# Patient Record
Sex: Male | Born: 1960 | Race: White | Hispanic: No | Marital: Single | Smoking: Former smoker
Health system: Southern US, Community
[De-identification: ages and names within clinical notes are randomized; demographics above are authoritative.]

## PROBLEM LIST (undated history)

## (undated) DIAGNOSIS — F17200 Nicotine dependence, unspecified, uncomplicated: Secondary | ICD-10-CM

## (undated) DIAGNOSIS — I10 Essential (primary) hypertension: Secondary | ICD-10-CM

## (undated) DIAGNOSIS — E119 Type 2 diabetes mellitus without complications: Secondary | ICD-10-CM

## (undated) DIAGNOSIS — C801 Malignant (primary) neoplasm, unspecified: Secondary | ICD-10-CM

## (undated) DIAGNOSIS — C229 Malignant neoplasm of liver, not specified as primary or secondary: Secondary | ICD-10-CM

## (undated) DIAGNOSIS — IMO0002 Reserved for concepts with insufficient information to code with codable children: Secondary | ICD-10-CM

## (undated) DIAGNOSIS — C3490 Malignant neoplasm of unspecified part of unspecified bronchus or lung: Secondary | ICD-10-CM

## (undated) DIAGNOSIS — C7951 Secondary malignant neoplasm of bone: Secondary | ICD-10-CM

## (undated) DIAGNOSIS — IMO0001 Reserved for inherently not codable concepts without codable children: Secondary | ICD-10-CM

## (undated) HISTORY — DX: Secondary malignant neoplasm of bone: C79.51

## (undated) HISTORY — PX: OTHER SURGICAL HISTORY: SHX169

## (undated) HISTORY — DX: Essential (primary) hypertension: I10

## (undated) HISTORY — DX: Type 2 diabetes mellitus without complications: E11.9

## (undated) HISTORY — DX: Nicotine dependence, unspecified, uncomplicated: F17.200

## (undated) HISTORY — DX: Malignant (primary) neoplasm, unspecified: C80.1

---

## 2001-05-10 ENCOUNTER — Emergency Department (HOSPITAL_COMMUNITY): Admission: EM | Admit: 2001-05-10 | Discharge: 2001-05-10 | Payer: Self-pay | Admitting: Emergency Medicine

## 2004-11-08 ENCOUNTER — Encounter (INDEPENDENT_AMBULATORY_CARE_PROVIDER_SITE_OTHER): Payer: Self-pay | Admitting: *Deleted

## 2004-11-08 ENCOUNTER — Ambulatory Visit (HOSPITAL_COMMUNITY): Admission: RE | Admit: 2004-11-08 | Discharge: 2004-11-08 | Payer: Self-pay | Admitting: *Deleted

## 2005-11-01 ENCOUNTER — Emergency Department (HOSPITAL_COMMUNITY): Admission: EM | Admit: 2005-11-01 | Discharge: 2005-11-01 | Payer: Self-pay | Admitting: Emergency Medicine

## 2006-02-21 ENCOUNTER — Emergency Department (HOSPITAL_COMMUNITY): Admission: EM | Admit: 2006-02-21 | Discharge: 2006-02-21 | Payer: Self-pay | Admitting: Emergency Medicine

## 2006-10-27 ENCOUNTER — Encounter: Admission: RE | Admit: 2006-10-27 | Discharge: 2006-10-27 | Payer: Self-pay | Admitting: Gastroenterology

## 2007-01-30 ENCOUNTER — Emergency Department (HOSPITAL_COMMUNITY): Admission: EM | Admit: 2007-01-30 | Discharge: 2007-01-30 | Payer: Self-pay | Admitting: Emergency Medicine

## 2007-02-04 ENCOUNTER — Encounter: Admission: RE | Admit: 2007-02-04 | Discharge: 2007-02-04 | Payer: Self-pay | Admitting: Emergency Medicine

## 2007-03-26 ENCOUNTER — Encounter: Admission: RE | Admit: 2007-03-26 | Discharge: 2007-03-26 | Payer: Self-pay | Admitting: Specialist

## 2010-03-30 ENCOUNTER — Emergency Department (HOSPITAL_COMMUNITY): Admission: EM | Admit: 2010-03-30 | Discharge: 2010-03-31 | Payer: Self-pay | Admitting: Emergency Medicine

## 2010-09-16 ENCOUNTER — Encounter: Payer: Self-pay | Admitting: Gastroenterology

## 2010-11-09 LAB — GLUCOSE, CAPILLARY: Glucose-Capillary: 128 mg/dL — ABNORMAL HIGH (ref 70–99)

## 2011-06-09 ENCOUNTER — Emergency Department (HOSPITAL_COMMUNITY): Payer: Federal, State, Local not specified - PPO

## 2011-06-09 ENCOUNTER — Emergency Department (HOSPITAL_COMMUNITY)
Admission: EM | Admit: 2011-06-09 | Discharge: 2011-06-09 | Disposition: A | Discharge status: Home / Self Care | Payer: Federal, State, Local not specified - PPO | Attending: Emergency Medicine | Admitting: Emergency Medicine

## 2011-06-09 DIAGNOSIS — I10 Essential (primary) hypertension: Secondary | ICD-10-CM | POA: Insufficient documentation

## 2011-06-09 DIAGNOSIS — H5789 Other specified disorders of eye and adnexa: Secondary | ICD-10-CM | POA: Insufficient documentation

## 2011-06-09 DIAGNOSIS — Z79899 Other long term (current) drug therapy: Secondary | ICD-10-CM | POA: Insufficient documentation

## 2011-06-09 DIAGNOSIS — S0510XA Contusion of eyeball and orbital tissues, unspecified eye, initial encounter: Secondary | ICD-10-CM | POA: Insufficient documentation

## 2011-06-09 DIAGNOSIS — F17200 Nicotine dependence, unspecified, uncomplicated: Secondary | ICD-10-CM | POA: Insufficient documentation

## 2011-06-09 DIAGNOSIS — E119 Type 2 diabetes mellitus without complications: Secondary | ICD-10-CM | POA: Insufficient documentation

## 2011-06-09 DIAGNOSIS — S01119A Laceration without foreign body of unspecified eyelid and periocular area, initial encounter: Secondary | ICD-10-CM | POA: Insufficient documentation

## 2011-06-09 DIAGNOSIS — H5710 Ocular pain, unspecified eye: Secondary | ICD-10-CM | POA: Insufficient documentation

## 2011-06-09 DIAGNOSIS — S0010XA Contusion of unspecified eyelid and periocular area, initial encounter: Secondary | ICD-10-CM | POA: Insufficient documentation

## 2011-06-09 DIAGNOSIS — K219 Gastro-esophageal reflux disease without esophagitis: Secondary | ICD-10-CM | POA: Insufficient documentation

## 2011-06-13 LAB — I-STAT 8, (EC8 V) (CONVERTED LAB)
Bicarbonate: 24
Chloride: 106
Glucose, Bld: 170 — ABNORMAL HIGH
HCT: 44
Hemoglobin: 15
Potassium: 4.1
TCO2: 25
pCO2, Ven: 44.1 — ABNORMAL LOW

## 2011-06-13 LAB — POCT I-STAT CREATININE: Creatinine, Ser: 0.8

## 2011-06-13 LAB — SAMPLE TO BLOOD BANK

## 2012-03-09 ENCOUNTER — Ambulatory Visit (INDEPENDENT_AMBULATORY_CARE_PROVIDER_SITE_OTHER): Payer: Federal, State, Local not specified - PPO | Admitting: Internal Medicine

## 2012-03-09 DIAGNOSIS — R0602 Shortness of breath: Secondary | ICD-10-CM

## 2012-03-09 LAB — PULMONARY FUNCTION TEST

## 2012-03-09 NOTE — Progress Notes (Signed)
PFT done today. 

## 2012-08-09 ENCOUNTER — Ambulatory Visit (INDEPENDENT_AMBULATORY_CARE_PROVIDER_SITE_OTHER): Payer: Federal, State, Local not specified - PPO | Admitting: Internal Medicine

## 2012-08-09 VITALS — BP 130/78 | HR 72 | Temp 98.5°F | Resp 18 | Wt 229.0 lb

## 2012-08-09 DIAGNOSIS — J9801 Acute bronchospasm: Secondary | ICD-10-CM

## 2012-08-09 DIAGNOSIS — IMO0002 Reserved for concepts with insufficient information to code with codable children: Secondary | ICD-10-CM

## 2012-08-09 DIAGNOSIS — L03112 Cellulitis of left axilla: Secondary | ICD-10-CM

## 2012-08-09 DIAGNOSIS — E119 Type 2 diabetes mellitus without complications: Secondary | ICD-10-CM

## 2012-08-09 DIAGNOSIS — I10 Essential (primary) hypertension: Secondary | ICD-10-CM

## 2012-08-09 DIAGNOSIS — E663 Overweight: Secondary | ICD-10-CM

## 2012-08-09 DIAGNOSIS — E785 Hyperlipidemia, unspecified: Secondary | ICD-10-CM | POA: Insufficient documentation

## 2012-08-09 DIAGNOSIS — F17200 Nicotine dependence, unspecified, uncomplicated: Secondary | ICD-10-CM | POA: Insufficient documentation

## 2012-08-09 DIAGNOSIS — J45909 Unspecified asthma, uncomplicated: Secondary | ICD-10-CM

## 2012-08-09 MED ORDER — DOXYCYCLINE HYCLATE 100 MG PO TABS: 100.0000 mg | ORAL_TABLET | Freq: Two times a day (BID) | ORAL | Status: DC

## 2012-08-09 MED ORDER — ALBUTEROL SULFATE HFA 108 (90 BASE) MCG/ACT IN AERS: 2.0000 | INHALATION_SPRAY | Freq: Four times a day (QID) | RESPIRATORY_TRACT | Status: AC | PRN

## 2012-08-09 NOTE — Progress Notes (Signed)
  Subjective:    Patient ID: Kerry Peck, male    DOB: [birth year], 51 y.o.   MRN: [id number]  HPI had a boil that drained under his left armpit this week/now has 3 or 4 other red tender bumps in the same area So complaining of cough for one week/nonproductive/no fever/no weight loss On Advair for probable asthma or reactive airway disease/he is unclear/he is a chronic smoker but says he has never been told that he has emphysema or chronic obstructive pulmonary disease/he was treated by pulmonology for pneumonia last year  Past medical history Patient Active Problem List  Diagnosis  . Hypertension  . Hyperlipidemia  . Diabetes mellitus  . Overweight  . Nicotine addiction  . Reactive airway disease   Current meds include Norvasc, Lipitor, fenofibrate, lisinopril, metformin, Lopressor and Advair  Review of Systems No fever chills or night sweats No recent weight loss/ no exposure to TB No chest pain or palpitations     Objective:   Physical Exam Vital signs stable except overweight Left axilla has 4 small tender papules that are red/nonfluctuant TMs clear Nares congested Throat clear Chest with scattered rhonchi both bases/wheezing on forced expiration bilaterally       Assessment & Plan:  Problem #1  small abscesses left axilla  Doxy 100 twice a day for 10 days/hot compresses  Problem #2 reactive airway disease secondary to LRI//cough plus bronchospasm Continue Advair Add albuterol

## 2013-02-22 ENCOUNTER — Other Ambulatory Visit: Payer: Self-pay | Admitting: Family Medicine

## 2013-02-22 ENCOUNTER — Ambulatory Visit
Admission: RE | Admit: 2013-02-22 | Discharge: 2013-02-22 | Disposition: A | Discharge status: Home / Self Care | Payer: Federal, State, Local not specified - PPO | Source: Ambulatory Visit | Attending: Family Medicine | Admitting: Family Medicine

## 2013-02-22 DIAGNOSIS — R05 Cough: Secondary | ICD-10-CM

## 2014-01-09 ENCOUNTER — Ambulatory Visit (INDEPENDENT_AMBULATORY_CARE_PROVIDER_SITE_OTHER): Payer: Federal, State, Local not specified - PPO | Admitting: Family Medicine

## 2014-01-09 ENCOUNTER — Ambulatory Visit: Payer: Federal, State, Local not specified - PPO

## 2014-01-09 VITALS — BP 119/71 | HR 72 | Temp 98.6°F | Resp 15 | Ht 72.0 in | Wt 233.0 lb

## 2014-01-09 DIAGNOSIS — R05 Cough: Secondary | ICD-10-CM

## 2014-01-09 DIAGNOSIS — R059 Cough, unspecified: Secondary | ICD-10-CM

## 2014-01-09 DIAGNOSIS — R079 Chest pain, unspecified: Secondary | ICD-10-CM

## 2014-01-09 DIAGNOSIS — R911 Solitary pulmonary nodule: Secondary | ICD-10-CM

## 2014-01-09 DIAGNOSIS — R062 Wheezing: Secondary | ICD-10-CM

## 2014-01-09 DIAGNOSIS — I10 Essential (primary) hypertension: Secondary | ICD-10-CM

## 2014-01-09 MED ORDER — PREDNISONE 20 MG PO TABS: ORAL_TABLET | ORAL | Status: DC

## 2014-01-09 MED ORDER — LEVOFLOXACIN 500 MG PO TABS: 500.0000 mg | ORAL_TABLET | Freq: Every day | ORAL | Status: DC

## 2014-01-09 NOTE — Patient Instructions (Addendum)
Lisinopril is causing the cough so stop it.  Please fill new prescriptions and return in 7 days for follow up chest x-ray

## 2014-01-09 NOTE — Progress Notes (Signed)
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 He apparently was having trouble with cough secondary to lisinopril, changed to benazepril, and then change back to lisinopril by his primary care physician's.  Patient also has a history of asthma and he smokes one half pack a day.  Patient has chest pain which he believes came from doing a series of pushups 2 weeks ago. The pain is less now and is bilateral and is more of an ache. The location is upper chest. He has a cough that's making this worse which is dry in nature and nonproductive.  Patient has no fever or shortness of breath but he does have wheezing.  Patient has no problem with sore throat, earache, neck pain, abdominal pain or nausea. He is taking his Advair intermittently as well as his rescue inhaler of albuterol.  Objective: No acute distress, patient alert and cooperative HEENT: Unremarkable Neck: Supple no adenopathy Heart: Regular no murmur Chest: Bilateral wheezes, some tenderness in the upper chest. [facility] reading (PRIMARY) by  Dr. Joseph Art. 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 In addition he's had a dry cough typical of ACE inhibitors and this is continuing to cough probably chest. Pulmonary nodule definitely needs followup with either his primary care doctor or, if he still out of town, patient to call me  Patient promises to return in one week for followup  Cough - Plan: DG Chest 2 View, predniSONE (DELTASONE) 20 MG tablet  Wheezing - Plan: DG Chest 2 View, predniSONE (DELTASONE) 20 MG tablet, levofloxacin (LEVAQUIN) 500 MG tablet  Chest pain, unspecified - Plan: DG Chest 2 View  Hypertension

## 2014-01-10 ENCOUNTER — Telehealth: Payer: Self-pay

## 2014-01-10 DIAGNOSIS — R911 Solitary pulmonary nodule: Secondary | ICD-10-CM

## 2014-01-10 NOTE — Telephone Encounter (Signed)
PT STATES HE HAD A CHEST XRAY DONE AND WOULD LIKE TO BE REFERRED TO [facility] FOR A CAT SCAN, WOULD LIKE TO BE ABLE TO GO IN THE MORNING ON Tuesday PLEASE CALL [phone] AND REALLY NEED ASAP

## 2014-01-10 NOTE — Telephone Encounter (Signed)
Pended CT scan. Per imaging results recommended CT scan.  Please advise.

## 2014-01-10 NOTE — Telephone Encounter (Signed)
Looks like Butch Penny is working on this. Is there anyway we can try to set this up tomorrow. Thanks

## 2014-01-11 ENCOUNTER — Other Ambulatory Visit: Payer: Self-pay | Admitting: Family Medicine

## 2014-01-11 ENCOUNTER — Ambulatory Visit
Admission: RE | Admit: 2014-01-11 | Discharge: 2014-01-11 | Disposition: A | Discharge status: Home / Self Care | Payer: Federal, State, Local not specified - PPO | Source: Ambulatory Visit | Attending: Family Medicine | Admitting: Family Medicine

## 2014-01-11 DIAGNOSIS — R911 Solitary pulmonary nodule: Secondary | ICD-10-CM

## 2014-01-11 DIAGNOSIS — R918 Other nonspecific abnormal finding of lung field: Secondary | ICD-10-CM

## 2014-01-11 MED ORDER — IOHEXOL 300 MG/ML  SOLN
75.0000 mL | Freq: Once | INTRAMUSCULAR | Status: AC | PRN
  Administered 2014-01-11: 75 mL via INTRAVENOUS

## 2014-01-16 ENCOUNTER — Ambulatory Visit (INDEPENDENT_AMBULATORY_CARE_PROVIDER_SITE_OTHER): Payer: Federal, State, Local not specified - PPO | Admitting: Family Medicine

## 2014-01-16 ENCOUNTER — Ambulatory Visit: Payer: Federal, State, Local not specified - PPO

## 2014-01-16 VITALS — BP 120/62 | HR 72 | Temp 98.5°F | Ht 66.5 in | Wt 224.0 lb

## 2014-01-16 DIAGNOSIS — R9389 Abnormal findings on diagnostic imaging of other specified body structures: Secondary | ICD-10-CM

## 2014-01-16 DIAGNOSIS — J209 Acute bronchitis, unspecified: Secondary | ICD-10-CM

## 2014-01-16 DIAGNOSIS — R918 Other nonspecific abnormal finding of lung field: Secondary | ICD-10-CM

## 2014-01-16 NOTE — Progress Notes (Signed)
53 year old gentleman comes in one week following diagnoses bronchitis and left pulmonary nodule. He had a CT scan which confirmed the presence of the nodule and the radiologist has recommended a PET scan or further evaluation.  Patient says he feels much better his breathing is much easier.  Objective: No acute distress Chest: Bilateral expiratory wheezes with good breath sounds both sides Heart: Regular no murmur [facility] reading (PRIMARY) by  Dr. Joseph Art chest x-ray shows persistent left midlung density.  Abnormal chest x-ray - Plan: DG Chest 2 View  Signed, Robyn Haber, MD

## 2014-01-20 ENCOUNTER — Institutional Professional Consult (permissible substitution): Payer: Federal, State, Local not specified - PPO | Admitting: Emergency Medicine

## 2014-02-01 ENCOUNTER — Encounter: Payer: Self-pay | Admitting: Thoracic Surgery (Cardiothoracic Vascular Surgery)

## 2014-02-01 ENCOUNTER — Other Ambulatory Visit: Payer: Self-pay | Admitting: *Deleted

## 2014-02-01 ENCOUNTER — Institutional Professional Consult (permissible substitution) (INDEPENDENT_AMBULATORY_CARE_PROVIDER_SITE_OTHER): Payer: Federal, State, Local not specified - PPO | Admitting: Thoracic Surgery (Cardiothoracic Vascular Surgery)

## 2014-02-01 VITALS — BP 137/81 | HR 71 | Resp 18 | Ht 74.0 in | Wt 227.0 lb

## 2014-02-01 DIAGNOSIS — R918 Other nonspecific abnormal finding of lung field: Secondary | ICD-10-CM

## 2014-02-01 DIAGNOSIS — D381 Neoplasm of uncertain behavior of trachea, bronchus and lung: Secondary | ICD-10-CM

## 2014-02-01 DIAGNOSIS — R222 Localized swelling, mass and lump, trunk: Secondary | ICD-10-CM

## 2014-02-01 NOTE — Progress Notes (Signed)
PCP is No PCP Per Patient Referring Provider is Lujean Amel, MD  Chief Complaint  Patient presents with  . Lung Lesion    X2 .Marland KitchenMarland KitchenLLLobe..CT CHEST    HPI: 53 year old male sent for consultation regarding a left lung mass.  Kerry Peck is a 53 year old gentleman with a history of tobacco abuse and COPD. He was in his usual state of health until a few of weeks ago. He was doing pushups when he noted pain in his chest. This was bilateral involving the rib cages. He had had a frequent cough due to taking an ACE inhibitor, which aggravates the pain. He saw Dr. Joseph Art. He was given antibiotics and steroids for bronchitis and a chest x-ray was performed. The chest x-ray showed a 3.4 cm mass in the left lower lobe. A CT then was done which showed a 2.8 x 3.5 cm left lower lobe mass with a small 1 cm satellite nodule in close proximity.   Kerry Peck says that he has not had any further chest pain since that one episode. It gradually resolved over the next couple of weeks. He denies any chest pain, pressure, or tightness with walking or other exertion. His cough resolved after stopping lisinopril. He denies hemoptysis. He has not had any change in his appetite or weight loss. He is still working full time Actor. He does have wheezing. He is on Advair, he uses his albuterol inhaler about once a day.  ECoG/ZUBROD= 0   Past Medical History  Diagnosis Date  . Diabetes mellitus without complication   . Hypertension   . Smoking addiction     No past surgical history on file.  No family history on file.  Social History History  Substance Use Topics  . Smoking status: Current Every Day Smoker -- 0.75 packs/day for 30 years    Types: Cigarettes  . Smokeless tobacco: Never Used  . Alcohol Use: 18.0 oz/week    30 Cans of beer per week     Comment: 6 beers per day x 5 days per week    Current Outpatient Prescriptions  Medication Sig Dispense Refill  . albuterol (PROVENTIL  HFA;VENTOLIN HFA) 108 (90 BASE) MCG/ACT inhaler Inhale 2 puffs into the lungs every 6 (six) hours as needed for wheezing.  1 Inhaler  0  . amLODipine (NORVASC) 5 MG tablet Take 5 mg by mouth daily.      Marland Kitchen atorvastatin (LIPITOR) 40 MG tablet Take 40 mg by mouth daily.      . fenofibrate 160 MG tablet Take 160 mg by mouth daily.      . Fluticasone-Salmeterol (ADVAIR) 250-50 MCG/DOSE AEPB Inhale 1 puff into the lungs every 12 (twelve) hours.      . metFORMIN (GLUCOPHAGE) 500 MG tablet Take 500 mg by mouth 2 (two) times daily with a meal.      . metoprolol tartrate (LOPRESSOR) 25 MG tablet Take 25 mg by mouth 2 (two) times daily.      . predniSONE (DELTASONE) 20 MG tablet 2 daily with food  10 tablet  1   No current facility-administered medications for this visit.    No Known Allergies  Review of Systems  Constitutional: Negative for fever, chills, activity change, appetite change and unexpected weight change.  Respiratory: Positive for cough (Secondary to lisinopril) and wheezing. Negative for shortness of breath.   Cardiovascular: Positive for chest pain (see HPI).  Neurological: Negative.   All other systems reviewed and are negative.   BP 137/81  Pulse 71  Resp 18  Ht 6\' 2"  (1.88 m)  Wt 227 lb (102.967 kg)  BMI 29.13 kg/m2  SpO2 93% Physical Exam  Vitals reviewed. Constitutional: He is oriented to person, place, and time. He appears well-developed and well-nourished. No distress.  HENT:  Head: Normocephalic and atraumatic.  Eyes: EOM are normal. Pupils are equal, round, and reactive to light.  Neck: Neck supple. No thyromegaly present.  Cardiovascular: Normal rate, regular rhythm, normal heart sounds and intact distal pulses.  Exam reveals no gallop and no friction rub.   No murmur heard. Pulmonary/Chest: Effort normal and breath sounds normal. He has no wheezes. He has no rales.  Abdominal: Soft. There is tenderness.  Musculoskeletal: He exhibits no edema.  Lymphadenopathy:     He has no cervical adenopathy.  Neurological: He is alert and oriented to person, place, and time. No cranial nerve deficit.  No focal deficit  Skin: Skin is warm and dry.     Diagnostic Tests: CT chest 01/11/2014 CT CHEST WITH CONTRAST  TECHNIQUE:  Multidetector CT imaging of the chest was performed during  intravenous contrast administration.  CONTRAST: 40mL OMNIPAQUE IOHEXOL 300 MG/ML SOLN  COMPARISON: Chest radiograph Jan 09, 2014 and chest CT March 30, 2010  FINDINGS:  There is underlying centrilobular emphysema. There is a mass in the  superior segment of the left lower lobe measuring 3.5 by 2.8 cm.  There is an adjacent smaller mass in the superior segment of the  left lower lobe measuring 1.0 by 1.0 cm. There is some mild  pneumonitis surrounding the larger mass.  Elsewhere, lungs are clear.  There is no appreciable thoracic adenopathy.  There are multiple foci of coronary artery calcification.  Pericardium is not thickened. There is no thoracic aortic aneurysm.  In the visualized upper abdomen, the adrenals appear normal. There  is a gallstone within the gallbladder. The gallbladder wall is not  thickened. No liver lesions are identified in the visualized  portions the liver. There is atherosclerotic change in the aorta.  There is degenerative change in the thoracic spine. There are  several compression fractures in the mid and lower thoracic as well  as upper lumbar spine regions. There are no blastic or lytic bone  lesions. Visualized thyroid appears normal.  IMPRESSION:  There are 2 mass lesions in the superior segment of the left lower  lobe, larger measuring 3.5 x 2.8 cm. Neoplasm is certainly  suspected. Given these nodular lesions, correlation with nuclear  medicine PET study or direct tissue sampling would be reasonable to  further assess.  Underlying emphysema.  Extensive coronary artery calcification.  No appreciable adenopathy or adrenal mass.   Cholelithiasis.  Electronically Signed  By: Lowella Grip M.D.  On: 01/11/2014 16:11    Impression: 53 year old smoker with a 3.5 cm dominant mass in the superior segment of the left lower lobe. There is an adjacent, smaller mass measuring 1 cm. This could be an adjacent lymph node or possibly a satellite tumor nodule. This mass is highly suspicious for primary bronchogenic carcinoma, and must be considered that unless it can be proven otherwise.  I reviewed the CT scan with Mr. Denardo. We discussed the differential diagnosis. I emphasized that this almost certainly was a lung cancer. It does appear resectable on CT.  I emphasized the importance of smoking cessation  I recommended that we do a PET/CT to evaluate for possible regional or distant metastases.  We also need to do pulmonary function testing with  and without bronchodilators to assess his pulmonary reserve. Based on his clinical history I do not think he would have difficulty tolerating a lobectomy.  Plan: PET/CT  Pulmonary function testing with and without bronchodilators and with room air ABG  Followup in one week to discuss results and plan further workup and treatment

## 2014-02-07 ENCOUNTER — Ambulatory Visit (HOSPITAL_COMMUNITY)
Admission: RE | Admit: 2014-02-07 | Discharge: 2014-02-07 | Disposition: A | Discharge status: Home / Self Care | Payer: Federal, State, Local not specified - PPO | Source: Ambulatory Visit | Attending: Thoracic Surgery (Cardiothoracic Vascular Surgery) | Admitting: Thoracic Surgery (Cardiothoracic Vascular Surgery)

## 2014-02-07 DIAGNOSIS — R918 Other nonspecific abnormal finding of lung field: Secondary | ICD-10-CM

## 2014-02-07 DIAGNOSIS — R222 Localized swelling, mass and lump, trunk: Secondary | ICD-10-CM | POA: Insufficient documentation

## 2014-02-07 MED ORDER — ALBUTEROL SULFATE (2.5 MG/3ML) 0.083% IN NEBU
2.5000 mg | INHALATION_SOLUTION | Freq: Once | RESPIRATORY_TRACT | Status: AC
  Administered 2014-02-07: 2.5 mg via RESPIRATORY_TRACT

## 2014-02-10 ENCOUNTER — Ambulatory Visit (HOSPITAL_COMMUNITY)
Admission: RE | Admit: 2014-02-10 | Discharge: 2014-02-10 | Disposition: A | Discharge status: Home / Self Care | Payer: Federal, State, Local not specified - PPO | Source: Ambulatory Visit | Attending: Thoracic Surgery (Cardiothoracic Vascular Surgery) | Admitting: Thoracic Surgery (Cardiothoracic Vascular Surgery)

## 2014-02-10 ENCOUNTER — Ambulatory Visit: Payer: Federal, State, Local not specified - PPO | Admitting: Thoracic Surgery (Cardiothoracic Vascular Surgery)

## 2014-02-10 DIAGNOSIS — C3430 Malignant neoplasm of lower lobe, unspecified bronchus or lung: Secondary | ICD-10-CM | POA: Insufficient documentation

## 2014-02-10 DIAGNOSIS — R918 Other nonspecific abnormal finding of lung field: Secondary | ICD-10-CM

## 2014-02-10 DIAGNOSIS — C787 Secondary malignant neoplasm of liver and intrahepatic bile duct: Secondary | ICD-10-CM | POA: Insufficient documentation

## 2014-02-10 DIAGNOSIS — C772 Secondary and unspecified malignant neoplasm of intra-abdominal lymph nodes: Secondary | ICD-10-CM | POA: Insufficient documentation

## 2014-02-10 DIAGNOSIS — C7952 Secondary malignant neoplasm of bone marrow: Secondary | ICD-10-CM

## 2014-02-10 DIAGNOSIS — C7951 Secondary malignant neoplasm of bone: Secondary | ICD-10-CM | POA: Insufficient documentation

## 2014-02-10 LAB — GLUCOSE, CAPILLARY: GLUCOSE-CAPILLARY: 106 mg/dL — AB (ref 70–99)

## 2014-02-10 MED ORDER — FLUDEOXYGLUCOSE F - 18 (FDG) INJECTION
12.4000 | Freq: Once | INTRAVENOUS | Status: AC | PRN
  Administered 2014-02-10: 12.4 via INTRAVENOUS

## 2014-02-11 ENCOUNTER — Ambulatory Visit (INDEPENDENT_AMBULATORY_CARE_PROVIDER_SITE_OTHER): Payer: Federal, State, Local not specified - PPO | Admitting: Thoracic Surgery (Cardiothoracic Vascular Surgery)

## 2014-02-11 ENCOUNTER — Other Ambulatory Visit: Payer: Self-pay | Admitting: *Deleted

## 2014-02-11 VITALS — BP 135/81 | HR 75 | Resp 16 | Ht 74.0 in | Wt 227.0 lb

## 2014-02-11 DIAGNOSIS — R918 Other nonspecific abnormal finding of lung field: Secondary | ICD-10-CM

## 2014-02-11 DIAGNOSIS — R222 Localized swelling, mass and lump, trunk: Secondary | ICD-10-CM

## 2014-02-11 NOTE — Progress Notes (Signed)
HPI:  Kerry Peck is a 53 year old gentleman with a left lower lobe mass who I saw in the office last week.  He had a PET/CT and pulmonary function tests and now returns to discuss the results of those and further management of his lung mass.  He has been feeling well. He is anxious about the results. No new issues his last visit.  Past Medical History  Diagnosis Date  . Diabetes mellitus without complication   . Hypertension   . Smoking addiction       Current Outpatient Prescriptions  Medication Sig Dispense Refill  . albuterol (PROVENTIL HFA;VENTOLIN HFA) 108 (90 BASE) MCG/ACT inhaler Inhale 2 puffs into the lungs every 6 (six) hours as needed for wheezing.  1 Inhaler  0  . amLODipine (NORVASC) 5 MG tablet Take 5 mg by mouth daily.      Marland Kitchen atorvastatin (LIPITOR) 40 MG tablet Take 40 mg by mouth daily.      . fenofibrate 160 MG tablet Take 160 mg by mouth daily.      . Fluticasone-Salmeterol (ADVAIR) 250-50 MCG/DOSE AEPB Inhale 1 puff into the lungs every 12 (twelve) hours.      . metFORMIN (GLUCOPHAGE) 500 MG tablet Take 500 mg by mouth 2 (two) times daily with a meal.      . metoprolol tartrate (LOPRESSOR) 25 MG tablet Take 25 mg by mouth 2 (two) times daily.      . predniSONE (DELTASONE) 20 MG tablet 2 daily with food  10 tablet  1   No current facility-administered medications for this visit.    Physical Exam BP 135/81  Pulse 75  Resp 16  Ht 6\' 2"  (1.88 m)  Wt 227 lb (102.967 kg)  BMI 29.13 kg/m2  SpO39 8% 53 year old man no acute distress Well-developed well-nourished Anxious  Diagnostic Tests: NUCLEAR MEDICINE PET SKULL BASE TO THIGH  TECHNIQUE:  12.4 mCi F-18 FDG was injected intravenously. Full-ring PET imaging  was performed from the skull base to thigh after the radiotracer. CT  data was obtained and used for attenuation correction and anatomic  localization.  FASTING BLOOD GLUCOSE: Value: 106 mg/dl  COMPARISON: CT 01/11/1949  FINDINGS:  NECK  No  hypermetabolic nodule or mass identified within the neck.  CHEST  The mass in the left lower lobe measures 4.0 cm and has an SUV max  equal to 8.7. Hypermetabolic left hilar lymph node has an SUV max  equal to 8.1. No hypermetabolic mediastinal or contralateral hilar  adenopathy.  ABDOMEN/PELVIS  Within the right hepatic lobe there is a small focus of increased  radiotracer uptake, suspicious for liver metastasis. This has an SUV  max equal to 4.6. Enlarged portacaval lymph node measures 2.6 cm and  has an SUV max equal to 8.7. The adrenal glands both appear normal.  The spleen is unremarkable.  SKELETON  Multi focal hypermetabolic bone metastasis noted. Hypermetabolic  left scapular lesion has an SUV max equal to 3.2. Bone lesions are  identified within the spine, pelvis and proximal femurs. T7 vertebra  stress set T7 lesion appears lytic measuring 2.1 cm. This has an SUV  max equal to 80.6. There is a mild pathologic compression deformity  identified at this level without evidence for retropulsion peer T10  spinous process lesion also appears lytic and has an SUV max equal  to 11.3. Additional compression fractures are identified at T12, L1  and L3. Hypermetabolic lesion involving the left iliac bone is noted  within SUV max equal  to 7.4. Within the right femoral neck there is  a hypermetabolic lesion within SUV max equal to 5.3.  IMPRESSION:  1. Left lower lung stress set left lower lobe lung mass exhibits  intense FDG uptake compatible with primary bronchogenic carcinoma.  2. Evidence of liver metastasis and upper abdominal lymph node  metastasis.  3. Multi focal bone metastasis. Compression deformities are present  within the thoracic and lumbar spine. The thoracic compression  deformity is likely pathologic.  Electronically Signed  By: Kerby Moors M.D.  On: 02/10/2014 15:30    Impression: 53 year old man with a left lower lobe mass with hilar adenopathy. He had a PET/CT  for initial staging. Unfortunately it appears that he has widespread metastatic disease with liver, upper abdominal node and multiple bony metastases. 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  He had questions regarding the prognosis. While this is a serious disease and an emotionally devastating diagnosis, there are people who responded extremely well to chemotherapy and there also is the possibility that some of these were more targeted therapies might be an option for him.  We need to establish a diagnosis. I think the best option might be an ultrasound guided liver biopsy. That would confirm staging and would be more likely to yield enough tissue for genetic testing than a bone biopsy.  Plan:  Liver biopsy  Follow up in [place]

## 2014-02-18 ENCOUNTER — Other Ambulatory Visit: Payer: Self-pay | Admitting: Radiology

## 2014-02-19 LAB — PULMONARY FUNCTION TEST
DL/VA % PRED: 98 %
DL/VA: 4.76 ml/min/mmHg/L
DLCO COR % PRED: 89 %
DLCO cor: 33.68 ml/min/mmHg
DLCO unc % pred: 89 %
DLCO unc: 33.68 ml/min/mmHg
FEF 25-75 PRE: 1.65 L/s
FEF 25-75 Post: 3.23 L/sec
FEF2575-%Change-Post: 95 %
FEF2575-%PRED-POST: 85 %
FEF2575-%PRED-PRE: 43 %
FEV1-%Change-Post: 31 %
FEV1-%PRED-PRE: 55 %
FEV1-%Pred-Post: 72 %
FEV1-PRE: 2.44 L
FEV1-Post: 3.21 L
FEV1FVC-%Change-Post: -3 %
FEV1FVC-%PRED-PRE: 88 %
FEV6-%CHANGE-POST: 33 %
FEV6-%PRED-POST: 87 %
FEV6-%Pred-Pre: 65 %
FEV6-Post: 4.81 L
FEV6-Pre: 3.6 L
FEV6FVC-%Change-Post: 0 %
FEV6FVC-%Pred-Post: 102 %
FEV6FVC-%Pred-Pre: 103 %
FVC-%Change-Post: 35 %
FVC-%PRED-POST: 85 %
FVC-%Pred-Pre: 62 %
FVC-POST: 4.89 L
FVC-Pre: 3.6 L
POST FEV1/FVC RATIO: 66 %
PRE FEV1/FVC RATIO: 68 %
Post FEV6/FVC ratio: 100 %
Pre FEV6/FVC Ratio: 100 %
RV % pred: 143 %
RV: 3.27 L
TLC % PRED: 100 %
TLC: 7.82 L

## 2014-02-21 ENCOUNTER — Ambulatory Visit (HOSPITAL_COMMUNITY)
Admission: RE | Admit: 2014-02-21 | Discharge: 2014-02-21 | Disposition: A | Discharge status: Home / Self Care | Payer: Federal, State, Local not specified - PPO | Source: Ambulatory Visit | Attending: Thoracic Surgery (Cardiothoracic Vascular Surgery) | Admitting: Thoracic Surgery (Cardiothoracic Vascular Surgery)

## 2014-02-21 ENCOUNTER — Encounter (HOSPITAL_COMMUNITY): Payer: Self-pay

## 2014-02-21 DIAGNOSIS — C787 Secondary malignant neoplasm of liver and intrahepatic bile duct: Secondary | ICD-10-CM | POA: Insufficient documentation

## 2014-02-21 DIAGNOSIS — K7689 Other specified diseases of liver: Secondary | ICD-10-CM | POA: Insufficient documentation

## 2014-02-21 DIAGNOSIS — Z79899 Other long term (current) drug therapy: Secondary | ICD-10-CM | POA: Insufficient documentation

## 2014-02-21 DIAGNOSIS — C7A00 Malignant carcinoid tumor of unspecified site: Secondary | ICD-10-CM | POA: Insufficient documentation

## 2014-02-21 DIAGNOSIS — IMO0002 Reserved for concepts with insufficient information to code with codable children: Secondary | ICD-10-CM | POA: Insufficient documentation

## 2014-02-21 DIAGNOSIS — F17200 Nicotine dependence, unspecified, uncomplicated: Secondary | ICD-10-CM | POA: Insufficient documentation

## 2014-02-21 DIAGNOSIS — R222 Localized swelling, mass and lump, trunk: Secondary | ICD-10-CM

## 2014-02-21 DIAGNOSIS — E119 Type 2 diabetes mellitus without complications: Secondary | ICD-10-CM | POA: Insufficient documentation

## 2014-02-21 DIAGNOSIS — I10 Essential (primary) hypertension: Secondary | ICD-10-CM | POA: Insufficient documentation

## 2014-02-21 LAB — CBC
HCT: 40.6 % (ref 39.0–52.0)
HEMOGLOBIN: 13.9 g/dL (ref 13.0–17.0)
MCH: 33 pg (ref 26.0–34.0)
MCHC: 34.2 g/dL (ref 30.0–36.0)
MCV: 96.4 fL (ref 78.0–100.0)
Platelets: 260 10*3/uL (ref 150–400)
RBC: 4.21 MIL/uL — ABNORMAL LOW (ref 4.22–5.81)
RDW: 12.6 % (ref 11.5–15.5)
WBC: 6.6 10*3/uL (ref 4.0–10.5)

## 2014-02-21 LAB — APTT: aPTT: 42 seconds — ABNORMAL HIGH (ref 24–37)

## 2014-02-21 LAB — GLUCOSE, CAPILLARY: Glucose-Capillary: 141 mg/dL — ABNORMAL HIGH (ref 70–99)

## 2014-02-21 LAB — PROTIME-INR
INR: 0.9 (ref 0.00–1.49)
PROTHROMBIN TIME: 12.2 s (ref 11.6–15.2)

## 2014-02-21 MED ORDER — HYDROCODONE-ACETAMINOPHEN 5-325 MG PO TABS
1.0000 | ORAL_TABLET | ORAL | Status: DC | PRN
  Filled 2014-02-21: qty 2

## 2014-02-21 MED ORDER — FENTANYL CITRATE 0.05 MG/ML IJ SOLN
INTRAMUSCULAR | Status: AC | PRN
  Administered 2014-02-21: 50 ug via INTRAVENOUS
  Administered 2014-02-21: 25 ug via INTRAVENOUS

## 2014-02-21 MED ORDER — MIDAZOLAM HCL 2 MG/2ML IJ SOLN
INTRAMUSCULAR | Status: AC
  Filled 2014-02-21: qty 6

## 2014-02-21 MED ORDER — MIDAZOLAM HCL 2 MG/2ML IJ SOLN
INTRAMUSCULAR | Status: AC | PRN
  Administered 2014-02-21: 0.5 mg via INTRAVENOUS
  Administered 2014-02-21: 1 mg via INTRAVENOUS

## 2014-02-21 MED ORDER — SODIUM CHLORIDE 0.9 % IV SOLN
INTRAVENOUS | Status: DC
  Administered 2014-02-21: 11:00:00 via INTRAVENOUS

## 2014-02-21 MED ORDER — FENTANYL CITRATE 0.05 MG/ML IJ SOLN
INTRAMUSCULAR | Status: AC
  Filled 2014-02-21: qty 6

## 2014-02-21 NOTE — Discharge Instructions (Addendum)
Please keep bandaid in place for 24 hours, you may remove after 24 hours and then shower.                                             Conscious Sedation, Adult, Care After Refer to this sheet in the next few weeks. These instructions provide you with information on caring for yourself after your procedure. Your health care provider may also give you more specific instructions. Your treatment has been planned according to current medical practices, but problems sometimes occur. Call your health care provider if you have any problems or questions after your procedure. WHAT TO EXPECT AFTER THE PROCEDURE  After your procedure:  You may feel sleepy, clumsy, and have poor balance for several hours.  Vomiting may occur if you eat too soon after the procedure. HOME CARE INSTRUCTIONS  Do not participate in any activities where you could become injured for at least 24 hours. Do not:  Drive.  Swim.  Ride a bicycle.  Operate heavy machinery.  Cook.  Use power tools.  Climb ladders.  Work from a high place.  Do not make important decisions or sign legal documents until you are improved.  If you vomit, drink water, juice, or soup when you can drink without vomiting. Make sure you have little or no nausea before eating solid foods.  Only take over-the-counter or prescription medicines for pain, discomfort, or fever as directed by your health care provider.  Make sure you and your family fully understand everything about the medicines given to you, including what side effects may occur.  You should not drink alcohol, take sleeping pills, or take medicines that cause drowsiness for at least 24 hours.  If you smoke, do not smoke without supervision.  If you are feeling better, you may resume normal activities 24 hours after you were sedated.  Keep all appointments with your health care provider. SEEK MEDICAL CARE IF:  Your skin is pale or bluish in color.  You continue to feel nauseous  or vomit.  Your pain is getting worse and is not helped by medicine.  You have bleeding or swelling.  You are still sleepy or feeling clumsy after 24 hours. SEEK IMMEDIATE MEDICAL CARE IF:  You develop a rash.  You have difficulty breathing.  You develop any type of allergic problem.  You have a fever. MAKE SURE YOU:  Understand these instructions.  Will watch your condition.  Will get help right away if you are not doing well or get worse. Document Released: 06/02/2013 Document Reviewed: 06/02/2013 [facility] Patient Information 2015 [place], [place]. This information is not intended to replace advice given to you by your health care provider. Make sure you discuss any questions you have with your health care provider. Liver Biopsy Care After These instructions give you information on caring for yourself after your procedure. Your doctor may also give you more specific instructions. Call your doctor if you have any problems or questions after your procedure. HOME CARE  Watch for bleeding at your biopsy site.  No heavy lifting, pushing, or pulling for 48 hours (2 days).  No exercise, jogging, or sex for 48 hours (2 days).  Do not drive or use heavy machinery for 24 hours (1 day).  Go back to your usual diet and medicines as told by your doctor.  Do not take the bandage  off until the next morning.  Only take medicine as told by your doctor.  Do not shower or bathe until the next day. GET HELP RIGHT AWAY IF:  You have shortness of breath or trouble breathing.  You have pain or cramping in your belly (abdomen).  You feel sick to your stomach (nauseous) or throw up (vomit).  Bleeding does not stop from the place where the needle was put in. Press on the place that is bleeding until you are checked in the Emergency Room.  Yellowish white fluid (pus) is coming from the place where the needle was put in.  You have any unusual pain that will not stop.  You have  puffiness (swelling) or redness at the place where the needle was put in, or if the place is very sore or hot when you touch it.  You have a fever of more than 102 F (38.9 C) for 2 or more days.  You have black, smelly poops (bowel movements). If you go to the Emergency Room, tell the nurse that you had a liver biopsy. Take this paper with you and show it to the nurse. Keep your follow-up appointment. MAKE SURE YOU:  Understand these instructions.  Will watch your condition.  Will get help right away if you are not doing well or get worse. Document Released: 05/21/2008 Document Revised: 11/04/2011 Document Reviewed: 05/21/2008 [facility] Patient Information 2015 [place], [place]. This information is not intended to replace advice given to you by your health care provider. Make sure you discuss any questions you have with your health care provider.

## 2014-02-21 NOTE — Procedures (Signed)
US guided biopsy of right hepatic lesion.  3 cores obtained, no immediate complication.

## 2014-02-21 NOTE — H&P (Signed)
Chief Complaint: "I am here for a liver or lung mass biopsy." Referring Physician: Dr. Roxan Hockey HPI: Kerry Peck is an 53 y.o. male who was complaining of a cough in 12/2013 CXR imaging revealed a 3.4 cm nodular opacity within the left mid lung. Follow up imaging CT and PET revealed metastatic lesions and LLL lung mass with hypermetabolism. He denies any further cough after stopping his ACEI medication. He denies any weight loss or hemoptysis. He denies any chest pain, shortness of breath or palpitations. He denies any active signs of bleeding or excessive bruising. He denies any recent fever or chills. The patient denies any history of sleep apnea or chronic oxygen use. He has previously tolerated sedation without complications. He does admit to tobacco use 3/4 PPD > 20 years   Past Medical History:  Past Medical History  Diagnosis Date  . Diabetes mellitus without complication   . Hypertension   . Smoking addiction     Past Surgical History: History reviewed. No pertinent past surgical history.  Family History: History reviewed. No pertinent family history.  Social History:  reports that he has been smoking Cigarettes.  He has a 22.5 pack-year smoking history. He has never used smokeless tobacco. He reports that he drinks about 18 ounces of alcohol per week. He reports that he does not use illicit drugs.  Allergies: No Known Allergies  Medications:   Medication List    ASK your doctor about these medications       albuterol 108 (90 BASE) MCG/ACT inhaler  Commonly known as:  PROVENTIL HFA;VENTOLIN HFA  Inhale 2 puffs into the lungs every 6 (six) hours as needed for wheezing.     amLODipine 5 MG tablet  Commonly known as:  NORVASC  Take 5 mg by mouth daily.     atorvastatin 40 MG tablet  Commonly known as:  LIPITOR  Take 40 mg by mouth daily.     fenofibrate 160 MG tablet  Take 160 mg by mouth daily.     Fluticasone-Salmeterol 250-50 MCG/DOSE Aepb  Commonly known  as:  ADVAIR  Inhale 1 puff into the lungs every 12 (twelve) hours.     metFORMIN 500 MG tablet  Commonly known as:  GLUCOPHAGE  Take 500 mg by mouth 2 (two) times daily with a meal.     metoprolol tartrate 25 MG tablet  Commonly known as:  LOPRESSOR  Take 25 mg by mouth 2 (two) times daily.     predniSONE 20 MG tablet  Commonly known as:  DELTASONE  2 daily with food       Please HPI for pertinent positives, otherwise complete 10 system ROS negative.  Physical Exam: BP 151/83  Pulse 78  Temp(Src) 98.5 F (36.9 C) (Oral)  Resp 18  SpO2 94% There is no weight on file to calculate BMI.  General Appearance:  Alert, cooperative, no distress  Head:  Normocephalic, without obvious abnormality, atraumatic  Neck: Supple, symmetrical, trachea midline  Lungs:   Clear to auscultation bilaterally, no w/r/r, respirations unlabored without use of accessory muscles.  Chest Wall:  No tenderness or deformity  Heart:  Regular rate and rhythm, S1, S2 normal, no murmur, rub or gallop.  Abdomen:   Soft, non-tender, non distended.  Extremities: Extremities normal, atraumatic, no cyanosis or edema  Neurologic: Normal affect, no gross deficits.   Results for orders placed during the hospital encounter of 02/21/14 (from the past 48 hour(s))  APTT     Status: Abnormal  Collection Time    02/21/14 11:00 AM      Result Value Ref Range   aPTT 42 (*) 24 - 37 seconds   Comment:            IF BASELINE aPTT IS ELEVATED,     SUGGEST PATIENT RISK ASSESSMENT     BE USED TO DETERMINE APPROPRIATE     ANTICOAGULANT THERAPY.  CBC     Status: Abnormal   Collection Time    02/21/14 11:00 AM      Result Value Ref Range   WBC 6.6  4.0 - 10.5 K/uL   RBC 4.21 (*) 4.22 - 5.81 MIL/uL   Hemoglobin 13.9  13.0 - 17.0 g/dL   HCT 40.6  39.0 - 52.0 %   MCV 96.4  78.0 - 100.0 fL   MCH 33.0  26.0 - 34.0 pg   MCHC 34.2  30.0 - 36.0 g/dL   RDW 12.6  11.5 - 15.5 %   Platelets 260  150 - 400 K/uL  PROTIME-INR      Status: None   Collection Time    02/21/14 11:00 AM      Result Value Ref Range   Prothrombin Time 12.2  11.6 - 15.2 seconds   INR 0.90  0.00 - 1.49   No results found.  Assessment/Plan Left lower lobe lung mass hypermetabolic on PET Tobacco abuse, ongoing Liver metastasis, request for biopsy Request for image guided biopsy today with moderate sedation Patient has been NPO, no blood thinners, labs and images reviewed. Risks and Benefits discussed with the patient. All of the patient's questions were answered, patient is agreeable to proceed. Consent signed and in chart.   Tsosie Billing D PA-C 02/21/2014, 12:10 PM

## 2014-02-24 ENCOUNTER — Telehealth: Payer: Self-pay | Admitting: *Deleted

## 2014-02-24 NOTE — Telephone Encounter (Signed)
Called left vm message regarding appt for Kerry Peck 03/03/14 at 1:45

## 2014-02-28 ENCOUNTER — Telehealth: Payer: Self-pay | Admitting: *Deleted

## 2014-02-28 NOTE — Telephone Encounter (Signed)
Called left vm message regarding appt for [place] 02/24/14 at 2:15

## 2014-03-01 ENCOUNTER — Telehealth: Payer: Self-pay | Admitting: Medical Oncology

## 2014-03-01 ENCOUNTER — Telehealth: Payer: Self-pay | Admitting: *Deleted

## 2014-03-01 NOTE — Telephone Encounter (Signed)
Called left vm message regarding appt time change.

## 2014-03-01 NOTE — Telephone Encounter (Signed)
Confirmed appointment and preparation for appt

## 2014-03-03 ENCOUNTER — Ambulatory Visit
Admission: RE | Admit: 2014-03-03 | Discharge: 2014-03-03 | Disposition: A | Discharge status: Home / Self Care | Payer: Federal, State, Local not specified - PPO | Source: Ambulatory Visit | Attending: Radiation Oncology | Admitting: Radiation Oncology

## 2014-03-03 ENCOUNTER — Telehealth: Payer: Self-pay | Admitting: Internal Medicine

## 2014-03-03 ENCOUNTER — Ambulatory Visit (HOSPITAL_BASED_OUTPATIENT_CLINIC_OR_DEPARTMENT_OTHER): Payer: Federal, State, Local not specified - PPO | Admitting: Internal Medicine

## 2014-03-03 ENCOUNTER — Encounter: Payer: Self-pay | Admitting: *Deleted

## 2014-03-03 ENCOUNTER — Ambulatory Visit: Payer: Federal, State, Local not specified - PPO | Attending: Internal Medicine | Admitting: Physical Therapy

## 2014-03-03 ENCOUNTER — Encounter: Payer: Self-pay | Admitting: Internal Medicine

## 2014-03-03 ENCOUNTER — Encounter: Payer: Self-pay | Admitting: Radiation Oncology

## 2014-03-03 VITALS — BP 145/70 | HR 73 | Temp 98.4°F | Resp 18 | Ht 74.0 in | Wt 226.6 lb

## 2014-03-03 DIAGNOSIS — C7951 Secondary malignant neoplasm of bone: Secondary | ICD-10-CM

## 2014-03-03 DIAGNOSIS — J45909 Unspecified asthma, uncomplicated: Secondary | ICD-10-CM | POA: Insufficient documentation

## 2014-03-03 DIAGNOSIS — E119 Type 2 diabetes mellitus without complications: Secondary | ICD-10-CM | POA: Insufficient documentation

## 2014-03-03 DIAGNOSIS — C787 Secondary malignant neoplasm of liver and intrahepatic bile duct: Secondary | ICD-10-CM

## 2014-03-03 DIAGNOSIS — M2550 Pain in unspecified joint: Secondary | ICD-10-CM | POA: Diagnosis not present

## 2014-03-03 DIAGNOSIS — IMO0001 Reserved for inherently not codable concepts without codable children: Secondary | ICD-10-CM | POA: Insufficient documentation

## 2014-03-03 DIAGNOSIS — E785 Hyperlipidemia, unspecified: Secondary | ICD-10-CM | POA: Diagnosis not present

## 2014-03-03 DIAGNOSIS — C3492 Malignant neoplasm of unspecified part of left bronchus or lung: Secondary | ICD-10-CM

## 2014-03-03 DIAGNOSIS — C3430 Malignant neoplasm of lower lobe, unspecified bronchus or lung: Secondary | ICD-10-CM | POA: Diagnosis not present

## 2014-03-03 DIAGNOSIS — C7952 Secondary malignant neoplasm of bone marrow: Secondary | ICD-10-CM

## 2014-03-03 DIAGNOSIS — F17200 Nicotine dependence, unspecified, uncomplicated: Secondary | ICD-10-CM

## 2014-03-03 DIAGNOSIS — C3490 Malignant neoplasm of unspecified part of unspecified bronchus or lung: Secondary | ICD-10-CM | POA: Insufficient documentation

## 2014-03-03 DIAGNOSIS — I10 Essential (primary) hypertension: Secondary | ICD-10-CM | POA: Insufficient documentation

## 2014-03-03 MED ORDER — PROCHLORPERAZINE MALEATE 10 MG PO TABS: 10.0000 mg | ORAL_TABLET | Freq: Four times a day (QID) | ORAL | Status: DC | PRN

## 2014-03-03 NOTE — Progress Notes (Signed)
Radiation Oncology         ([phone] ________________________________  Initial outpatient Consultation  Name: Kerry Peck MRN: [id number]  Date: 03/03/2014  DOB: [birth year]  NT:[id number],[email or web address], MD  Melrose Nakayama, *   REFERRING PHYSICIAN: [place] Nakayama, *  DIAGNOSIS: Extensive stage small cell lung cancer Small cell lung cancer   Primary site: Lung (Left)   Staging method: AJCC 7th Edition   Clinical: Stage IV (T2a, N1, M1b)    Summary: Stage IV (T2a, N1, M1b)   HISTORY OF PRESENT ILLNESS::Kerry Peck is a 53 y.o. male who is seen out of the courtesy of Dr. Roxan Hockey for an opinion concerning radiation therapy as part of  [facility]. Earlier this year the patient noticed some discomfort in the chest after doing pushups. He also was noted to have a frequent cough. Patient was seen by primary care and given antibiotics and steroids. A chest x-ray was also performed which showed a left lower lobe lung mass. A subsequent chest CT scan revealed a 3.5 cm mass. Patient was referred to Dr. Roxan Hockey a PET scan was performed which revealed the activity in the left lower lung mass as well as liver metastasis upper abdominal lymph node metastasis and multifocal osseous metastasis. 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 With this information the patient is seen in the multidisciplinary clinic .  PREVIOUS RADIATION THERAPY: No  PAST MEDICAL HISTORY:  has a past medical history of Diabetes mellitus without complication; Hypertension; and Smoking addiction.    PAST SURGICAL HISTORY:History reviewed. No pertinent past surgical history.  FAMILY HISTORY: family history is not on file.  SOCIAL HISTORY:  reports that he has been smoking Cigarettes.  He has a 22.5 pack-year smoking history. He has never used smokeless tobacco. He reports that he  drinks about 18 ounces of alcohol per week. He reports that he does not use illicit drugs.  ALLERGIES: Review of patient's allergies indicates no known allergies.  MEDICATIONS:  Current Outpatient Prescriptions  Medication Sig Dispense Refill  . albuterol (PROVENTIL HFA;VENTOLIN HFA) 108 (90 BASE) MCG/ACT inhaler Inhale 2 puffs into the lungs every 6 (six) hours as needed for wheezing.  1 Inhaler  0  . amLODipine (NORVASC) 10 MG tablet Take 10 mg by mouth daily.      Marland Kitchen atorvastatin (LIPITOR) 40 MG tablet Take 40 mg by mouth daily.      . fenofibrate 160 MG tablet Take 160 mg by mouth daily.      . Fluticasone-Salmeterol (ADVAIR) 250-50 MCG/DOSE AEPB Inhale 1 puff into the lungs every 12 (twelve) hours.      . metFORMIN (GLUCOPHAGE) 500 MG tablet Take 500 mg by mouth daily with breakfast.       . metoprolol tartrate (LOPRESSOR) 25 MG tablet Take 25 mg by mouth daily.       . predniSONE (DELTASONE) 20 MG tablet 2 daily with food  10 tablet  1  . prochlorperazine (COMPAZINE) 10 MG tablet Take 1 tablet (10 mg total) by mouth every 6 (six) hours as needed for nausea or vomiting.  30 tablet  0   No current facility-administered medications for this encounter.    REVIEW OF SYSTEMS:  A 15 point review of systems is documented in the electronic medical record. This was obtained by the nursing staff. However, I reviewed this with the patient to discuss relevant findings and make appropriate changes.  He denies any headaches or visual problems. He denies any pain in the in his hip areas are clear denies any pain with standing or walking he denies any significant cough or hemoptysis. his energy level is good at this time. 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 Examination of the pupils reveals him to be equal round reactive to light. Extraocular eye movements are intact. The tongue is midline. There is no secondary infection noted the oral cavity or posterior pharynx. Examination of the neck and supraclavicular region reveals no evidence of adenopathy. Axillary areas are free of adenopathy. examination of the lungs reveals them to be clear. The heart has a regular rhythm and rate. The abdomen is soft and nontender with normal bowel sounds. On neurological examination motor strength is 5 out of 5 in the proximal and distal muscle groups in the upper lower extremities. Peripheral pulses are good. No cyanosis clubbing or edema is appreciated.    ECOG = 1  0 - Asymptomatic (Fully active, able to carry on all predisease activities without restriction)  1 - Symptomatic but completely ambulatory (Restricted in physically strenuous activity but ambulatory and able to carry out work of a light or sedentary nature. For example, light housework, office work)  2 - Symptomatic, <50% in bed during the day (Ambulatory and capable of all self care but unable to carry out any work activities. Up and about more than 50% of waking hours)  3 - Symptomatic, >50% in bed, but not bedbound (Capable of only limited self-care, confined to bed or chair 50% or more of waking hours)  4 - Bedbound (Completely disabled. Cannot carry on any self-care. Totally confined to bed or chair)  5 - Death   Eustace Pen MM, Creech RH, Tormey DC, et al. [phone]). "Toxicity and response criteria of the [facility] Group". [place] 5 (6): 649-55  LABORATORY DATA:  Lab Results  Component Value Date   WBC 6.6 02/21/2014   HGB 13.9 02/21/2014   HCT 40.6 02/21/2014   MCV 96.4 02/21/2014   PLT 260 02/21/2014   Lab Results  Component Value Date   NA 139 01/30/2007   K 4.1 01/30/2007   CL 106 01/30/2007   GLUCOSE 170* 01/30/2007   CREATININE  0.8 01/30/2007      RADIOGRAPHY: Nm Pet Image Initial (pi) Skull Base To Thigh  02/10/2014   CLINICAL DATA:  Initial treatment strategy for Lung cancer.  EXAM: NUCLEAR MEDICINE PET SKULL BASE TO THIGH  TECHNIQUE: 12.4 mCi F-18 FDG was injected intravenously. Full-ring PET imaging was performed from the skull base to thigh after the radiotracer. CT data was obtained and used for attenuation correction and anatomic localization.  FASTING BLOOD GLUCOSE:  Value: 106 mg/dl  COMPARISON:  CT 01/11/1949  FINDINGS: NECK  No hypermetabolic nodule or mass identified within the neck.  CHEST  The mass in the left lower lobe measures 4.0 cm and has an SUV max equal to 8.7. Hypermetabolic left hilar lymph node has an SUV max equal to 8.1. No hypermetabolic mediastinal or contralateral hilar adenopathy.  ABDOMEN/PELVIS  Within the right hepatic lobe there is a small focus  of increased radiotracer uptake, suspicious for liver metastasis. This has an SUV max equal to 4.6. Enlarged portacaval lymph node measures 2.6 cm and has an SUV max equal to 8.7. The adrenal glands both appear normal. The spleen is unremarkable.  SKELETON  Multi focal hypermetabolic bone metastasis noted. Hypermetabolic left scapular lesion has an SUV max equal to 3.2. Bone lesions are identified within the spine, pelvis and proximal femurs. T7 vertebra stress set T7 lesion appears lytic measuring 2.1 cm. This has an SUV max equal to 80.6. There is a mild pathologic compression deformity identified at this level without evidence for retropulsion peer T10 spinous process lesion also appears lytic and has an SUV max equal to 11.3. Additional compression fractures are identified at T12, L1 and L3. Hypermetabolic lesion involving the left iliac bone is noted within SUV max equal to 7.4. Within the right femoral neck there is a hypermetabolic lesion within SUV max equal to 5.3.  IMPRESSION: 1. Left lower lung stress set left lower lobe lung mass exhibits intense  FDG uptake compatible with primary bronchogenic carcinoma. 2. Evidence of liver metastasis and upper abdominal lymph node metastasis. 3. Multi focal bone metastasis. Compression deformities are present within the thoracic and lumbar spine. The thoracic compression deformity is likely pathologic.   Electronically Signed   By: Kerby Moors M.D.   On: 02/10/2014 15:30   US Biopsy  02/21/2014   CLINICAL DATA:  53 year old with a left lung lesion and concern for metastatic disease. Suspicious liver lesion on recent PET-CT.  EXAM: ULTRASOUND-GUIDED LIVER LESION BIOPSY  Physician: Stephan Minister. Anselm Pancoast, MD  FLUOROSCOPY TIME:  None  MEDICATIONS: 1.5 mg versed, 75 mcg fentanyl. A radiology nurse monitored the patient for moderate sedation.  ANESTHESIA/SEDATION: Moderate sedation time: 17 min  PROCEDURE: The procedure was explained to the patient. The risks and benefits of the procedure were discussed and the patient's questions were addressed. Informed consent was obtained from the patient. The liver was evaluated with ultrasound. A few heterogeneous small liver lesions were identified with ultrasound. A lesion in the right hepatic lobe adjacent to the gallbladder and just below the costal margin was targeted. The right abdomen was prepped with Betadine and a sterile field was created. Skin was anesthetized with 1% lidocaine. Using ultrasound guidance, a 17 gauge needle was directed into the small lesion. A total of 3 core biopsies were obtained with an 18 gauge core device. Samples were placed in formalin. Needle was removed without complication. Bandage placed over the puncture site.  FINDINGS: At least three heterogeneous lesions were identified in the liver. The largest is in the central right hepatic lobe and appears to correspond with the recent PET-CT imaging. A 6 mm lesion in the right hepatic lobe adjacent to the gallbladder was sampled.  COMPLICATIONS: None  IMPRESSION: Ultrasound-guided core biopsies of a small right  hepatic lesion.  Subtle heterogeneous lesions in the liver are suggestive for metastatic disease.   Electronically Signed   By: Markus Daft M.D.   On: 02/21/2014 15:30      IMPRESSION: Extensive stage small cell lung cancer. Patient does not appear to be symptomatic from his osseous metastasis, if however he develops any pain in the right upper leg area then we may need to consider radiation treatment to this region given the involvement in the right femoral neck with metastasis. Patient will proceed with systemic chemotherapy in the near future, with no immediate plans for radiation therapy unless brain MRI shows involvement.  PLAN: MRI of the brain      ------------------------------------------------  Blair Promise, PhD, MD

## 2014-03-03 NOTE — Progress Notes (Signed)
[place] Telephone:([phone]) [phone]   [phone]  CONSULT NOTE  REFERRING PHYSICIAN: Dr. Modesto Charon  REASON FOR CONSULTATION:  53 years old white male diagnosed with lung cancer  HPI Kerry Peck is a 53 y.o. male with past medical history significant for hypertension, dyslipidemia as well as diabetes mellitus. The patient mentions that in early May he has been complaining of increasing cough and shortness of breath. He was seen at the urgent care centers and his treatment with lisinopril was discontinued. Chest x-ray performed 01/09/2014 showed 3.4 mass lesion in the left midlung. This was followed by CT scan of the chest on 01/11/2014 and it showed a mass in the superior segment of the left lower lobe measuring 3.5 by 2.8 cm. There is an adjacent smaller mass in the superior segment of the left lower lobe measuring 1.0 by 1.0 cm.  The patient was referred to Dr. Roxan Hockey and a PET scan was performed on 02/10/2014 and it showed the mass in the left lower lobe measures 4.0 cm and has an SUV max equal to 8.7. Hypermetabolic left hilar lymph node has an SUV max equal to 8.1. No hypermetabolic mediastinal or contralateral hilar adenopathy. Within the right hepatic lobe there is a small focus of increased  radiotracer uptake, suspicious for liver metastasis. This has an SUV max equal to 4.6. Enlarged portacaval lymph node measures 2.6 cm and has an SUV max equal to 8.7. Multi focal hypermetabolic bone metastasis noted. Hypermetabolic left scapular lesion has an SUV max equal to 3.2. Bone lesions are identified within the spine, pelvis and proximal femurs. T7 vertebra stress set T7 lesion appears lytic measuring 2.1 cm. This has an SUV max equal to 80.6. There is a mild pathologic compression deformity identified at this level without evidence for retropulsion peer T10 spinous process lesion also appears lytic and has an SUV max equal to 11.3. Additional  compression fractures are identified at T12, L1 and L3. Hypermetabolic lesion involving the left iliac bone is noted within SUV max equal to 7.4. Within the right femoral neck there is a hypermetabolic lesion within SUV max equal to 5.3. On 02/21/2014 the patient underwent ultrasound-guided liver core biopsy by interventional radiology. 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 Immunohistochemical stains were performed and the malignant cells are strongly positive for cytokeratin 7, positive for neuroendocrine markers chromogranin and synaptophysin, focally positive for p63, negative for CDX2, TTF-1, cytokeratin 20, and cytokeratin 5/6. The overall findings are mostly consistent with metastatic poorly differentiated neuroendocrine carcinoma. Given the radiographic findings, the morphologic features are mostly consistent with lung primary. Small cell carcinoma is favored. Dr. Roxan Hockey kindly referred the patient to me today for evaluation and discussion of his treatment options. When seen today he continues to complain of mild pain on the left hip area as well as mild cough but no hemoptysis. He denied having any significant chest pain or shortness of breath. He has no significant weight loss or night sweats. The patient denied having any headache or blurry vision. He has no nausea, vomiting or change in his bowel movement. Family history significant for a mother who died from pancreatic cancer at age 63 and father died from heart attack at age 58. The patient is single and has no children. His sister is his caregiver. He works at the [employer]. He has a history of smoking one pack per day for around 3 years and unfortunately continues to smoke. I  strongly advise him to quit smoking and offered him to smoke cessation program. The patient also drinks sixpacks of beer everyday but he has no history of drug abuse.   HPI  Past Medical  History  Diagnosis Date  . Diabetes mellitus without complication   . Hypertension   . Smoking addiction     No past surgical history on file.  No family history on file.  Social History History  Substance Use Topics  . Smoking status: Current Every Day Smoker -- 0.75 packs/day for 30 years    Types: Cigarettes  . Smokeless tobacco: Never Used  . Alcohol Use: 18.0 oz/week    30 Cans of beer per week     Comment: 6 beers per day x 5 days per week    No Known Allergies  Current Outpatient Prescriptions  Medication Sig Dispense Refill  . albuterol (PROVENTIL HFA;VENTOLIN HFA) 108 (90 BASE) MCG/ACT inhaler Inhale 2 puffs into the lungs every 6 (six) hours as needed for wheezing.  1 Inhaler  0  . amLODipine (NORVASC) 10 MG tablet Take 10 mg by mouth daily.      Marland Kitchen atorvastatin (LIPITOR) 40 MG tablet Take 40 mg by mouth daily.      . fenofibrate 160 MG tablet Take 160 mg by mouth daily.      . Fluticasone-Salmeterol (ADVAIR) 250-50 MCG/DOSE AEPB Inhale 1 puff into the lungs every 12 (twelve) hours.      . metFORMIN (GLUCOPHAGE) 500 MG tablet Take 500 mg by mouth daily with breakfast.       . metoprolol tartrate (LOPRESSOR) 25 MG tablet Take 25 mg by mouth daily.       . predniSONE (DELTASONE) 20 MG tablet 2 daily with food  10 tablet  1   No current facility-administered medications for this visit.    Review of Systems  Constitutional: negative Eyes: negative Ears, nose, mouth, throat, and face: negative Respiratory: negative Cardiovascular: negative Gastrointestinal: negative Genitourinary:negative Integument/breast: negative Hematologic/lymphatic: negative Musculoskeletal:positive for bone pain Neurological: negative Behavioral/Psych: negative Endocrine: negative Allergic/Immunologic: negative  Physical Exam  VFI:EPPIR, healthy, no distress, well nourished, well developed and anxious SKIN: skin color, texture, turgor are normal, no rashes or significant  lesions HEAD: Normocephalic, No masses, lesions, tenderness or abnormalities EYES: normal, PERRLA EARS: External ears normal, Canals clear OROPHARYNX:no exudate, no erythema and lips, buccal mucosa, and tongue normal  NECK: supple, no adenopathy, no JVD LYMPH:  no palpable lymphadenopathy, no hepatosplenomegaly LUNGS: clear to auscultation , and palpation HEART: regular rate & rhythm, no murmurs and no gallops ABDOMEN:abdomen soft, non-tender, normal bowel sounds and no masses or organomegaly BACK: Back symmetric, no curvature., No CVA tenderness EXTREMITIES:no joint deformities, effusion, or inflammation, no edema, no skin discoloration, no clubbing  NEURO: alert & oriented x 3 with fluent speech, no focal motor/sensory deficits, gait normal  PERFORMANCE STATUS: ECOG 1  LABORATORY DATA: Lab Results  Component Value Date   WBC 6.6 02/21/2014   HGB 13.9 02/21/2014   HCT 40.6 02/21/2014   MCV 96.4 02/21/2014   PLT 260 02/21/2014      Chemistry      Component Value Date/Time   NA 139 01/30/2007 2027   K 4.1 01/30/2007 2027   CL 106 01/30/2007 2027   BUN 7 01/30/2007 2027   CREATININE 0.8 01/30/2007 2027   No results found for this basename: CALCIUM, ALKPHOS, AST, ALT, BILITOT       RADIOGRAPHIC STUDIES: Nm Pet Image Initial (pi) Skull Base To  Thigh  02/10/2014   CLINICAL DATA:  Initial treatment strategy for Lung cancer.  EXAM: NUCLEAR MEDICINE PET SKULL BASE TO THIGH  TECHNIQUE: 12.4 mCi F-18 FDG was injected intravenously. Full-ring PET imaging was performed from the skull base to thigh after the radiotracer. CT data was obtained and used for attenuation correction and anatomic localization.  FASTING BLOOD GLUCOSE:  Value: 106 mg/dl  COMPARISON:  CT 01/11/1949  FINDINGS: NECK  No hypermetabolic nodule or mass identified within the neck.  CHEST  The mass in the left lower lobe measures 4.0 cm and has an SUV max equal to 8.7. Hypermetabolic left hilar lymph node has an SUV max equal to 8.1. No  hypermetabolic mediastinal or contralateral hilar adenopathy.  ABDOMEN/PELVIS  Within the right hepatic lobe there is a small focus of increased radiotracer uptake, suspicious for liver metastasis. This has an SUV max equal to 4.6. Enlarged portacaval lymph node measures 2.6 cm and has an SUV max equal to 8.7. The adrenal glands both appear normal. The spleen is unremarkable.  SKELETON  Multi focal hypermetabolic bone metastasis noted. Hypermetabolic left scapular lesion has an SUV max equal to 3.2. Bone lesions are identified within the spine, pelvis and proximal femurs. T7 vertebra stress set T7 lesion appears lytic measuring 2.1 cm. This has an SUV max equal to 80.6. There is a mild pathologic compression deformity identified at this level without evidence for retropulsion peer T10 spinous process lesion also appears lytic and has an SUV max equal to 11.3. Additional compression fractures are identified at T12, L1 and L3. Hypermetabolic lesion involving the left iliac bone is noted within SUV max equal to 7.4. Within the right femoral neck there is a hypermetabolic lesion within SUV max equal to 5.3.  IMPRESSION: 1. Left lower lung stress set left lower lobe lung mass exhibits intense FDG uptake compatible with primary bronchogenic carcinoma. 2. Evidence of liver metastasis and upper abdominal lymph node metastasis. 3. Multi focal bone metastasis. Compression deformities are present within the thoracic and lumbar spine. The thoracic compression deformity is likely pathologic.   Electronically Signed   By: Kerby Moors M.D.   On: 02/10/2014 15:30   US Biopsy  02/21/2014   CLINICAL DATA:  53 year old with a left lung lesion and concern for metastatic disease. Suspicious liver lesion on recent PET-CT.  EXAM: ULTRASOUND-GUIDED LIVER LESION BIOPSY  Physician: Stephan Minister. Anselm Pancoast, MD  FLUOROSCOPY TIME:  None  MEDICATIONS: 1.5 mg versed, 75 mcg fentanyl. A radiology nurse monitored the patient for moderate sedation.   ANESTHESIA/SEDATION: Moderate sedation time: 17 min  PROCEDURE: The procedure was explained to the patient. The risks and benefits of the procedure were discussed and the patient's questions were addressed. Informed consent was obtained from the patient. The liver was evaluated with ultrasound. A few heterogeneous small liver lesions were identified with ultrasound. A lesion in the right hepatic lobe adjacent to the gallbladder and just below the costal margin was targeted. The right abdomen was prepped with Betadine and a sterile field was created. Skin was anesthetized with 1% lidocaine. Using ultrasound guidance, a 17 gauge needle was directed into the small lesion. A total of 3 core biopsies were obtained with an 18 gauge core device. Samples were placed in formalin. Needle was removed without complication. Bandage placed over the puncture site.  FINDINGS: At least three heterogeneous lesions were identified in the liver. The largest is in the central right hepatic lobe and appears to correspond with the recent PET-CT imaging.  A 6 mm lesion in the right hepatic lobe adjacent to the gallbladder was sampled.  COMPLICATIONS: None  IMPRESSION: Ultrasound-guided core biopsies of a small right hepatic lesion.  Subtle heterogeneous lesions in the liver are suggestive for metastatic disease.   Electronically Signed   By: Markus Daft M.D.   On: 02/21/2014 15:30    ASSESSMENT: This is a very pleasant 53 years old white male recently diagnosed with extensive stage (T2a, N1, M1b) small cell lung cancer presented with left lower lobe lung lesion in addition to metastatic liver and bone lesions diagnosed in June of 2015.   PLAN: I had a lengthy discussion with the patient today about his current disease stage, prognosis and treatment options. I explained to the patient that he has incurable condition and owns her treatment will be of palliative nature. I gave the patient the option of systemic chemotherapy versus  palliative care and hospice referral. The patient is interested in proceeding with systemic chemotherapy. I would consider him for systemic chemotherapy with carboplatin for AUC of 5 on day 1 and etoposide at 120 mg/M2 on days 1, 2 and 3 with Neulasta support on day 4. I discussed with the patient adverse effect of the chemotherapy including but not limited to alopecia, myelosuppression, nausea and vomiting, peripheral neuropathy, liver or renal dysfunction. The patient would like to proceed with treatment as planned and he does expected to start the first cycle of his treatment on 03/08/2014. He gave a verbal consent for the treatment. I will arrange for the patient to have a chemotherapy education class before starting the first dose of his treatment. I would also complete the staging workup by ordering an MRI of the brain to rule out brain metastasis. The patient was also seen today by Dr. Sondra Come from radiation oncology for evaluation and consideration of palliative radiotherapy to any painful bone lesion but he is currently asymptomatic except for mild pain on the left hip area. I will call his pharmacy was prescription for Compazine 10 mg by mouth every 6 hours as needed for nausea. For smoke cessation, I strongly encouraged the patient to quit smoking and offered him to smoke cessation program. I also strongly advise him to quit alcohol drinking during his chemotherapy. He would come back for followup visit in 2 weeks for reevaluation and management any adverse effect of his treatment. The patient was seen during the multidisciplinary thoracic oncology clinic today by medical oncology, radiation oncology, thoracic navigator, social worker and physical therapist. He was advised to call immediately if he has any concerning symptoms in the interval.  The patient voices understanding of current disease status and treatment options and is in agreement with the current care plan.  All questions were  answered. The patient knows to call the clinic with any problems, questions or concerns. We can certainly see the patient much sooner if necessary.  Thank you so much for allowing me to participate in the care of Kerry Peck. I will continue to follow up the patient with you and assist in his care.  I spent 55 minutes counseling the patient face to face. The total time spent in the appointment was 80 minutes.  Disclaimer: This note was dictated with voice recognition software. Similar sounding words can inadvertently be transcribed and may not be corrected upon review.   Bentlee Benningfield K. 03/03/2014, 4:21 PM

## 2014-03-03 NOTE — Telephone Encounter (Signed)
gave pt appt for lab,md , and chemo class for tomorrow, emailed michelle regarding chemo for 7/13

## 2014-03-03 NOTE — Patient Instructions (Signed)
Smoking Cessation, Tips for Success If you are ready to quit smoking, congratulations! You have chosen to help yourself be healthier. Cigarettes bring nicotine, tar, carbon monoxide, and other irritants into your body. Your lungs, heart, and blood vessels will be able to work better without these poisons. There are many different ways to quit smoking. Nicotine gum, nicotine patches, a nicotine inhaler, or nicotine nasal spray can help with physical craving. Hypnosis, support groups, and medicines help break the habit of smoking. WHAT THINGS CAN I DO TO MAKE QUITTING EASIER?  Here are some tips to help you quit for good:  Pick a date when you will quit smoking completely. Tell all of your friends and family about your plan to quit on that date.  Do not try to slowly cut down on the number of cigarettes you are smoking. Pick a quit date and quit smoking completely starting on that day.  Throw away all cigarettes.   Clean and remove all ashtrays from your home, work, and car.   On a card, write down your reasons for quitting. Carry the card with you and read it when you get the urge to smoke.   Cleanse your body of nicotine. Drink enough water and fluids to keep your urine clear or pale yellow. Do this after quitting to flush the nicotine from your body.   Learn to predict your moods. Do not let a bad situation be your excuse to have a cigarette. Some situations in your life might tempt you into wanting a cigarette.   Never have "just one" cigarette. It leads to wanting another and another. Remind yourself of your decision to quit.   Change habits associated with smoking. If you smoked while driving or when feeling stressed, try other activities to replace smoking. Stand up when drinking your coffee. Brush your teeth after eating. Sit in a different chair when you read the paper. Avoid alcohol while trying to quit, and try to drink fewer caffeinated beverages. Alcohol and caffeine may urge  you to smoke.   Avoid foods and drinks that can trigger a desire to smoke, such as sugary or spicy foods and alcohol.   Ask people who smoke not to smoke around you.   Have something planned to do right after eating or having a cup of coffee. For example, plan to take a walk or exercise.   Try a relaxation exercise to calm you down and decrease your stress. Remember, you may be tense and nervous for the first 2 weeks after you quit, but this will pass.   Find new activities to keep your hands busy. Play with a pen, coin, or rubber band. Doodle or draw things on paper.   Brush your teeth right after eating. This will help cut down on the craving for the taste of tobacco after meals. You can also try mouthwash.   Use oral substitutes in place of cigarettes. Try using lemon drops, carrots, cinnamon sticks, or chewing gum. Keep them handy so they are available when you have the urge to smoke.   When you have the urge to smoke, try deep breathing.   Designate your home as a nonsmoking area.   If you are a heavy smoker, ask your health care provider about a prescription for nicotine chewing gum. It can ease your withdrawal from nicotine.   Reward yourself. Set aside the cigarette money you save and buy yourself something nice.   Look for support from others. Join a support group or   smoking cessation program. Ask someone at home or at work to help you with your plan to quit smoking.   Always ask yourself, "Do I need this cigarette or is this just a reflex?" Tell yourself, "Today, I choose not to smoke," or "I do not want to smoke." You are reminding yourself of your decision to quit.  Do not replace cigarette smoking with electronic cigarettes (commonly called e-cigarettes). The safety of e-cigarettes is unknown, and some may contain harmful chemicals.  If you relapse, do not give up! Plan ahead and think about what you will do the next time you get the urge to smoke.  HOW WILL  I FEEL WHEN I QUIT SMOKING? You may have symptoms of withdrawal because your body is used to nicotine (the addictive substance in cigarettes). You may crave cigarettes, be irritable, feel very hungry, cough often, get headaches, or have difficulty concentrating. The withdrawal symptoms are only temporary. They are strongest when you first quit but will go away within 10-14 days. When withdrawal symptoms occur, stay in control. Think about your reasons for quitting. Remind yourself that these are signs that your body is healing and getting used to being without cigarettes. Remember that withdrawal symptoms are easier to treat than the major diseases that smoking can cause.  Even after the withdrawal is over, expect periodic urges to smoke. However, these cravings are generally short lived and will go away whether you smoke or not. Do not smoke!  WHAT RESOURCES ARE AVAILABLE TO HELP ME QUIT SMOKING? Your health care provider can direct you to community resources or hospitals for support, which may include:  Group support.  Education.  Hypnosis.  Therapy. Document Released: 05/10/2004 Document Revised: 06/02/2013 Document Reviewed: 01/28/2013 [facility] Patient Information 2015 [place], [place]. This information is not intended to replace advice given to you by your health care provider. Make sure you discuss any questions you have with your health care provider.

## 2014-03-04 ENCOUNTER — Other Ambulatory Visit: Payer: Self-pay | Admitting: *Deleted

## 2014-03-04 ENCOUNTER — Telehealth: Payer: Self-pay | Admitting: *Deleted

## 2014-03-04 ENCOUNTER — Telehealth: Payer: Self-pay | Admitting: Internal Medicine

## 2014-03-04 ENCOUNTER — Other Ambulatory Visit: Payer: Federal, State, Local not specified - PPO

## 2014-03-04 NOTE — Telephone Encounter (Signed)
Per staff message and POF I have scheduled appts. Advised scheduler of appts. Advised scheduled that lab and injection appt need to be moved.  JMW

## 2014-03-04 NOTE — Telephone Encounter (Signed)
Called Dotty @ Smoking cessation class , they will call pt

## 2014-03-04 NOTE — Telephone Encounter (Signed)
Called pt to follow up from [facility] yesterday.  Pt stated he is doing fine and is set up for chemotherapy.  He did not have any questions or concerns.

## 2014-03-05 ENCOUNTER — Encounter: Payer: Self-pay | Admitting: Internal Medicine

## 2014-03-07 ENCOUNTER — Other Ambulatory Visit: Payer: Federal, State, Local not specified - PPO

## 2014-03-07 ENCOUNTER — Encounter: Payer: Self-pay | Admitting: *Deleted

## 2014-03-08 ENCOUNTER — Other Ambulatory Visit: Payer: Self-pay | Admitting: *Deleted

## 2014-03-08 ENCOUNTER — Ambulatory Visit (HOSPITAL_BASED_OUTPATIENT_CLINIC_OR_DEPARTMENT_OTHER): Payer: Federal, State, Local not specified - PPO

## 2014-03-08 ENCOUNTER — Other Ambulatory Visit: Payer: Self-pay | Admitting: Internal Medicine

## 2014-03-08 ENCOUNTER — Other Ambulatory Visit (HOSPITAL_BASED_OUTPATIENT_CLINIC_OR_DEPARTMENT_OTHER): Payer: Federal, State, Local not specified - PPO

## 2014-03-08 ENCOUNTER — Ambulatory Visit: Payer: Federal, State, Local not specified - PPO

## 2014-03-08 VITALS — BP 148/71 | HR 89 | Temp 97.5°F | Resp 18

## 2014-03-08 DIAGNOSIS — C787 Secondary malignant neoplasm of liver and intrahepatic bile duct: Secondary | ICD-10-CM

## 2014-03-08 DIAGNOSIS — C3430 Malignant neoplasm of lower lobe, unspecified bronchus or lung: Secondary | ICD-10-CM

## 2014-03-08 DIAGNOSIS — C3492 Malignant neoplasm of unspecified part of left bronchus or lung: Secondary | ICD-10-CM

## 2014-03-08 DIAGNOSIS — Z5111 Encounter for antineoplastic chemotherapy: Secondary | ICD-10-CM

## 2014-03-08 LAB — COMPREHENSIVE METABOLIC PANEL (CC13)
ALBUMIN: 3.8 g/dL (ref 3.5–5.0)
ALT: 26 U/L (ref 0–55)
AST: 41 U/L — AB (ref 5–34)
Alkaline Phosphatase: 80 U/L (ref 40–150)
Anion Gap: 11 mEq/L (ref 3–11)
BUN: 9.5 mg/dL (ref 7.0–26.0)
CO2: 22 mEq/L (ref 22–29)
Calcium: 9.9 mg/dL (ref 8.4–10.4)
Chloride: 109 mEq/L (ref 98–109)
Creatinine: 0.9 mg/dL (ref 0.7–1.3)
Glucose: 237 mg/dl — ABNORMAL HIGH (ref 70–140)
POTASSIUM: 4.3 meq/L (ref 3.5–5.1)
SODIUM: 142 meq/L (ref 136–145)
TOTAL PROTEIN: 8 g/dL (ref 6.4–8.3)
Total Bilirubin: 0.37 mg/dL (ref 0.20–1.20)

## 2014-03-08 LAB — CBC WITH DIFFERENTIAL/PLATELET
BASO%: 1.1 % (ref 0.0–2.0)
Basophils Absolute: 0.1 10*3/uL (ref 0.0–0.1)
EOS ABS: 1.4 10*3/uL — AB (ref 0.0–0.5)
EOS%: 17.1 % — AB (ref 0.0–7.0)
HCT: 42.7 % (ref 38.4–49.9)
HGB: 14.3 g/dL (ref 13.0–17.1)
LYMPH#: 1.7 10*3/uL (ref 0.9–3.3)
LYMPH%: 21.7 % (ref 14.0–49.0)
MCH: 32.5 pg (ref 27.2–33.4)
MCHC: 33.5 g/dL (ref 32.0–36.0)
MCV: 97.2 fL (ref 79.3–98.0)
MONO#: 0.6 10*3/uL (ref 0.1–0.9)
MONO%: 7.9 % (ref 0.0–14.0)
NEUT%: 52.2 % (ref 39.0–75.0)
NEUTROS ABS: 4.2 10*3/uL (ref 1.5–6.5)
Platelets: 351 10*3/uL (ref 140–400)
RBC: 4.39 10*6/uL (ref 4.20–5.82)
RDW: 12.7 % (ref 11.0–14.6)
WBC: 8 10*3/uL (ref 4.0–10.3)

## 2014-03-08 MED ORDER — SODIUM CHLORIDE 0.9 % IV SOLN
Freq: Once | INTRAVENOUS | Status: AC
  Administered 2014-03-08: 12:00:00 via INTRAVENOUS

## 2014-03-08 MED ORDER — SODIUM CHLORIDE 0.9 % IV SOLN
120.0000 mg/m2 | Freq: Once | INTRAVENOUS | Status: AC
  Administered 2014-03-08: 280 mg via INTRAVENOUS
  Filled 2014-03-08: qty 14

## 2014-03-08 MED ORDER — SODIUM CHLORIDE 0.9 % IV SOLN
750.0000 mg | Freq: Once | INTRAVENOUS | Status: AC
  Administered 2014-03-08: 750 mg via INTRAVENOUS
  Filled 2014-03-08: qty 75

## 2014-03-08 MED ORDER — DEXAMETHASONE SODIUM PHOSPHATE 20 MG/5ML IJ SOLN
20.0000 mg | Freq: Once | INTRAMUSCULAR | Status: AC
  Administered 2014-03-08: 20 mg via INTRAVENOUS

## 2014-03-08 MED ORDER — DEXAMETHASONE SODIUM PHOSPHATE 20 MG/5ML IJ SOLN
INTRAMUSCULAR | Status: AC
  Filled 2014-03-08: qty 5

## 2014-03-08 MED ORDER — ONDANSETRON 16 MG/50ML IVPB (CHCC)
16.0000 mg | Freq: Once | INTRAVENOUS | Status: AC
  Administered 2014-03-08: 16 mg via INTRAVENOUS

## 2014-03-08 MED ORDER — ONDANSETRON 16 MG/50ML IVPB (CHCC)
INTRAVENOUS | Status: AC
  Filled 2014-03-08: qty 16

## 2014-03-08 NOTE — Patient Instructions (Signed)
[name] Discharge Instructions for Patients Receiving Chemotherapy  Today you received the following chemotherapy agents Etoposide/Carboplatin.  To help prevent nausea and vomiting after your treatment, we encourage you to take your nausea medication as prescribed.   If you develop nausea and vomiting that is not controlled by your nausea medication, call the clinic.   BELOW ARE SYMPTOMS THAT SHOULD BE REPORTED IMMEDIATELY:  *FEVER GREATER THAN 100.5 F  *CHILLS WITH OR WITHOUT FEVER  NAUSEA AND VOMITING THAT IS NOT CONTROLLED WITH YOUR NAUSEA MEDICATION  *UNUSUAL SHORTNESS OF BREATH  *UNUSUAL BRUISING OR BLEEDING  TENDERNESS IN MOUTH AND THROAT WITH OR WITHOUT PRESENCE OF ULCERS  *URINARY PROBLEMS  *BOWEL PROBLEMS  UNUSUAL RASH Items with * indicate a potential emergency and should be followed up as soon as possible.  Feel free to call the clinic you have any questions or concerns. The clinic phone number is [phone].

## 2014-03-09 ENCOUNTER — Ambulatory Visit (HOSPITAL_BASED_OUTPATIENT_CLINIC_OR_DEPARTMENT_OTHER): Payer: Federal, State, Local not specified - PPO

## 2014-03-09 VITALS — BP 143/74 | HR 79 | Temp 97.3°F | Resp 18

## 2014-03-09 DIAGNOSIS — Z5111 Encounter for antineoplastic chemotherapy: Secondary | ICD-10-CM

## 2014-03-09 DIAGNOSIS — C7951 Secondary malignant neoplasm of bone: Secondary | ICD-10-CM

## 2014-03-09 DIAGNOSIS — C3492 Malignant neoplasm of unspecified part of left bronchus or lung: Secondary | ICD-10-CM

## 2014-03-09 DIAGNOSIS — C787 Secondary malignant neoplasm of liver and intrahepatic bile duct: Secondary | ICD-10-CM

## 2014-03-09 DIAGNOSIS — C3430 Malignant neoplasm of lower lobe, unspecified bronchus or lung: Secondary | ICD-10-CM

## 2014-03-09 DIAGNOSIS — C7952 Secondary malignant neoplasm of bone marrow: Secondary | ICD-10-CM

## 2014-03-09 MED ORDER — ONDANSETRON 8 MG/NS 50 ML IVPB
INTRAVENOUS | Status: AC
  Filled 2014-03-09: qty 8

## 2014-03-09 MED ORDER — DEXAMETHASONE SODIUM PHOSPHATE 10 MG/ML IJ SOLN
INTRAMUSCULAR | Status: AC
  Filled 2014-03-09: qty 1

## 2014-03-09 MED ORDER — SODIUM CHLORIDE 0.9 % IV SOLN
Freq: Once | INTRAVENOUS | Status: AC
  Administered 2014-03-09: 12:00:00 via INTRAVENOUS

## 2014-03-09 MED ORDER — SODIUM CHLORIDE 0.9 % IV SOLN
120.0000 mg/m2 | Freq: Once | INTRAVENOUS | Status: AC
  Administered 2014-03-09: 280 mg via INTRAVENOUS
  Filled 2014-03-09: qty 14

## 2014-03-09 MED ORDER — ONDANSETRON 8 MG/50ML IVPB (CHCC)
8.0000 mg | Freq: Once | INTRAVENOUS | Status: AC
  Administered 2014-03-09: 8 mg via INTRAVENOUS

## 2014-03-09 MED ORDER — DEXAMETHASONE SODIUM PHOSPHATE 10 MG/ML IJ SOLN
10.0000 mg | Freq: Once | INTRAMUSCULAR | Status: AC
  Administered 2014-03-09: 10 mg via INTRAVENOUS

## 2014-03-09 NOTE — Patient Instructions (Signed)
[place] Discharge Instructions for Patients Receiving Chemotherapy  Today you received the following chemotherapy agents: Etoposide.  To help prevent nausea and vomiting after your treatment, we encourage you to take your nausea medication compazine 10 mg every 6 hours as needed.   If you develop nausea and vomiting that is not controlled by your nausea medication, call the clinic.   BELOW ARE SYMPTOMS THAT SHOULD BE REPORTED IMMEDIATELY:  *FEVER GREATER THAN 100.5 F  *CHILLS WITH OR WITHOUT FEVER  NAUSEA AND VOMITING THAT IS NOT CONTROLLED WITH YOUR NAUSEA MEDICATION  *UNUSUAL SHORTNESS OF BREATH  *UNUSUAL BRUISING OR BLEEDING  TENDERNESS IN MOUTH AND THROAT WITH OR WITHOUT PRESENCE OF ULCERS  *URINARY PROBLEMS  *BOWEL PROBLEMS  UNUSUAL RASH Items with * indicate a potential emergency and should be followed up as soon as possible.  Feel free to call the clinic you have any questions or concerns. The clinic phone number is [phone].

## 2014-03-10 ENCOUNTER — Ambulatory Visit (HOSPITAL_BASED_OUTPATIENT_CLINIC_OR_DEPARTMENT_OTHER): Payer: Federal, State, Local not specified - PPO

## 2014-03-10 ENCOUNTER — Ambulatory Visit: Payer: Federal, State, Local not specified - PPO

## 2014-03-10 VITALS — BP 141/68 | HR 64 | Temp 98.5°F

## 2014-03-10 DIAGNOSIS — C3492 Malignant neoplasm of unspecified part of left bronchus or lung: Secondary | ICD-10-CM

## 2014-03-10 DIAGNOSIS — C7952 Secondary malignant neoplasm of bone marrow: Secondary | ICD-10-CM

## 2014-03-10 DIAGNOSIS — C7951 Secondary malignant neoplasm of bone: Secondary | ICD-10-CM

## 2014-03-10 DIAGNOSIS — C787 Secondary malignant neoplasm of liver and intrahepatic bile duct: Secondary | ICD-10-CM

## 2014-03-10 DIAGNOSIS — C3430 Malignant neoplasm of lower lobe, unspecified bronchus or lung: Secondary | ICD-10-CM

## 2014-03-10 DIAGNOSIS — Z5111 Encounter for antineoplastic chemotherapy: Secondary | ICD-10-CM

## 2014-03-10 MED ORDER — SODIUM CHLORIDE 0.9 % IV SOLN
Freq: Once | INTRAVENOUS | Status: AC
  Administered 2014-03-10: 13:00:00 via INTRAVENOUS

## 2014-03-10 MED ORDER — DEXAMETHASONE SODIUM PHOSPHATE 10 MG/ML IJ SOLN
10.0000 mg | Freq: Once | INTRAMUSCULAR | Status: AC
  Administered 2014-03-10: 10 mg via INTRAVENOUS

## 2014-03-10 MED ORDER — ONDANSETRON 8 MG/NS 50 ML IVPB
INTRAVENOUS | Status: AC
  Filled 2014-03-10: qty 8

## 2014-03-10 MED ORDER — DEXAMETHASONE SODIUM PHOSPHATE 10 MG/ML IJ SOLN
INTRAMUSCULAR | Status: AC
  Filled 2014-03-10: qty 1

## 2014-03-10 MED ORDER — ONDANSETRON 8 MG/50ML IVPB (CHCC)
8.0000 mg | Freq: Once | INTRAVENOUS | Status: AC
  Administered 2014-03-10: 8 mg via INTRAVENOUS

## 2014-03-10 MED ORDER — SODIUM CHLORIDE 0.9 % IV SOLN
120.0000 mg/m2 | Freq: Once | INTRAVENOUS | Status: AC
  Administered 2014-03-10: 280 mg via INTRAVENOUS
  Filled 2014-03-10: qty 14

## 2014-03-10 NOTE — Patient Instructions (Signed)
[facility] Discharge Instructions for Patients Receiving Chemotherapy  Today you received the following chemotherapy agents:  Etoposide  To help prevent nausea and vomiting after your treatment, we encourage you to take your nausea medication as ordered per MD.   If you develop nausea and vomiting that is not controlled by your nausea medication, call the clinic.   BELOW ARE SYMPTOMS THAT SHOULD BE REPORTED IMMEDIATELY:  *FEVER GREATER THAN 100.5 F  *CHILLS WITH OR WITHOUT FEVER  NAUSEA AND VOMITING THAT IS NOT CONTROLLED WITH YOUR NAUSEA MEDICATION  *UNUSUAL SHORTNESS OF BREATH  *UNUSUAL BRUISING OR BLEEDING  TENDERNESS IN MOUTH AND THROAT WITH OR WITHOUT PRESENCE OF ULCERS  *URINARY PROBLEMS  *BOWEL PROBLEMS  UNUSUAL RASH Items with * indicate a potential emergency and should be followed up as soon as possible.  Feel free to call the clinic you have any questions or concerns. The clinic phone number is [phone].

## 2014-03-11 ENCOUNTER — Ambulatory Visit (HOSPITAL_BASED_OUTPATIENT_CLINIC_OR_DEPARTMENT_OTHER): Payer: Federal, State, Local not specified - PPO

## 2014-03-11 ENCOUNTER — Telehealth: Payer: Self-pay | Admitting: *Deleted

## 2014-03-11 VITALS — BP 131/72 | HR 68 | Temp 98.6°F

## 2014-03-11 DIAGNOSIS — C787 Secondary malignant neoplasm of liver and intrahepatic bile duct: Secondary | ICD-10-CM

## 2014-03-11 DIAGNOSIS — Z5189 Encounter for other specified aftercare: Secondary | ICD-10-CM

## 2014-03-11 DIAGNOSIS — C3492 Malignant neoplasm of unspecified part of left bronchus or lung: Secondary | ICD-10-CM

## 2014-03-11 DIAGNOSIS — C3430 Malignant neoplasm of lower lobe, unspecified bronchus or lung: Secondary | ICD-10-CM

## 2014-03-11 MED ORDER — PEGFILGRASTIM INJECTION 6 MG/0.6ML
6.0000 mg | Freq: Once | SUBCUTANEOUS | Status: AC
  Administered 2014-03-11: 6 mg via SUBCUTANEOUS
  Filled 2014-03-11: qty 0.6

## 2014-03-11 NOTE — Patient Instructions (Signed)

## 2014-03-11 NOTE — Telephone Encounter (Signed)
Spoke with pt today for post chemo follow up.  Pt stated he was doing fine.  Denied nausea/vomiting;  Stated good appetite, and drinking lots of fluids as tolerated.  Stated bowel and bladder function fine.  Denied pain.  Pt aware of next returned appt.

## 2014-03-14 ENCOUNTER — Ambulatory Visit (HOSPITAL_COMMUNITY)
Admission: RE | Admit: 2014-03-14 | Discharge: 2014-03-14 | Disposition: A | Discharge status: Home / Self Care | Payer: Federal, State, Local not specified - PPO | Source: Ambulatory Visit | Attending: Internal Medicine | Admitting: Internal Medicine

## 2014-03-14 ENCOUNTER — Ambulatory Visit (HOSPITAL_BASED_OUTPATIENT_CLINIC_OR_DEPARTMENT_OTHER): Payer: Federal, State, Local not specified - PPO | Admitting: Physician Assistant

## 2014-03-14 ENCOUNTER — Encounter: Payer: Self-pay | Admitting: Physician Assistant

## 2014-03-14 ENCOUNTER — Telehealth: Payer: Self-pay | Admitting: Internal Medicine

## 2014-03-14 ENCOUNTER — Other Ambulatory Visit (HOSPITAL_BASED_OUTPATIENT_CLINIC_OR_DEPARTMENT_OTHER): Payer: Federal, State, Local not specified - PPO

## 2014-03-14 VITALS — BP 110/58 | HR 84 | Temp 98.2°F | Resp 18 | Ht 74.0 in | Wt 225.4 lb

## 2014-03-14 DIAGNOSIS — C7949 Secondary malignant neoplasm of other parts of nervous system: Secondary | ICD-10-CM

## 2014-03-14 DIAGNOSIS — H0520 Unspecified exophthalmos: Secondary | ICD-10-CM | POA: Insufficient documentation

## 2014-03-14 DIAGNOSIS — I6789 Other cerebrovascular disease: Secondary | ICD-10-CM | POA: Insufficient documentation

## 2014-03-14 DIAGNOSIS — C7931 Secondary malignant neoplasm of brain: Secondary | ICD-10-CM | POA: Insufficient documentation

## 2014-03-14 DIAGNOSIS — G319 Degenerative disease of nervous system, unspecified: Secondary | ICD-10-CM | POA: Insufficient documentation

## 2014-03-14 DIAGNOSIS — E119 Type 2 diabetes mellitus without complications: Secondary | ICD-10-CM | POA: Insufficient documentation

## 2014-03-14 DIAGNOSIS — C3430 Malignant neoplasm of lower lobe, unspecified bronchus or lung: Secondary | ICD-10-CM

## 2014-03-14 DIAGNOSIS — C3492 Malignant neoplasm of unspecified part of left bronchus or lung: Secondary | ICD-10-CM

## 2014-03-14 DIAGNOSIS — I10 Essential (primary) hypertension: Secondary | ICD-10-CM | POA: Insufficient documentation

## 2014-03-14 DIAGNOSIS — C3490 Malignant neoplasm of unspecified part of unspecified bronchus or lung: Secondary | ICD-10-CM

## 2014-03-14 LAB — COMPREHENSIVE METABOLIC PANEL (CC13)
ALK PHOS: 91 U/L (ref 40–150)
ALT: 22 U/L (ref 0–55)
AST: 21 U/L (ref 5–34)
Albumin: 3.7 g/dL (ref 3.5–5.0)
Anion Gap: 10 mEq/L (ref 3–11)
BUN: 24.1 mg/dL (ref 7.0–26.0)
CO2: 27 mEq/L (ref 22–29)
Calcium: 10 mg/dL (ref 8.4–10.4)
Chloride: 105 mEq/L (ref 98–109)
Creatinine: 0.8 mg/dL (ref 0.7–1.3)
Glucose: 199 mg/dl — ABNORMAL HIGH (ref 70–140)
Potassium: 3.8 mEq/L (ref 3.5–5.1)
SODIUM: 142 meq/L (ref 136–145)
Total Bilirubin: 1.01 mg/dL (ref 0.20–1.20)
Total Protein: 7.1 g/dL (ref 6.4–8.3)

## 2014-03-14 LAB — CBC WITH DIFFERENTIAL/PLATELET
BASO%: 0.3 % (ref 0.0–2.0)
BASOS ABS: 0 10*3/uL (ref 0.0–0.1)
EOS%: 4.9 % (ref 0.0–7.0)
Eosinophils Absolute: 0.5 10*3/uL (ref 0.0–0.5)
HCT: 40.4 % (ref 38.4–49.9)
HGB: 13.6 g/dL (ref 13.0–17.1)
LYMPH%: 19.2 % (ref 14.0–49.0)
MCH: 32.5 pg (ref 27.2–33.4)
MCHC: 33.7 g/dL (ref 32.0–36.0)
MCV: 96.4 fL (ref 79.3–98.0)
MONO#: 0.2 10*3/uL (ref 0.1–0.9)
MONO%: 1.6 % (ref 0.0–14.0)
NEUT%: 74 % (ref 39.0–75.0)
NEUTROS ABS: 7.7 10*3/uL — AB (ref 1.5–6.5)
PLATELETS: 200 10*3/uL (ref 140–400)
RBC: 4.19 10*6/uL — AB (ref 4.20–5.82)
RDW: 12.6 % (ref 11.0–14.6)
WBC: 10.4 10*3/uL — ABNORMAL HIGH (ref 4.0–10.3)
lymph#: 2 10*3/uL (ref 0.9–3.3)

## 2014-03-14 MED ORDER — GADOBENATE DIMEGLUMINE 529 MG/ML IV SOLN
20.0000 mL | Freq: Once | INTRAVENOUS | Status: AC | PRN
  Administered 2014-03-14: 20 mL via INTRAVENOUS

## 2014-03-14 NOTE — Patient Instructions (Signed)
Continue weekly labs as scheduled Follow up in 2 weeks for another symptom management visit prior to next scheduled cycle of chemotherapy

## 2014-03-14 NOTE — Telephone Encounter (Signed)
gv adn printed appt sched adn avs for pt for July adn Aug....sed added tx.

## 2014-03-14 NOTE — Progress Notes (Addendum)
No images are attached to the encounter. No scans are attached to the encounter. No scans are attached to the encounter. [facility] VISIT PROGRESS NOTE  Lujean Amel, MD [address] [place] [address]  DIAGNOSIS: Small cell lung cancer, extensive stage   Primary site: Lung (Left)   Staging method: AJCC 7th Edition   Clinical: Stage IV (T2a, N1, M1b) signed by Curt Bears, MD on 03/03/2014  4:16 PM   Summary: Stage IV (T2a, N1, M1b)  PRIOR THERAPY: None  CURRENT THERAPY: Systemic chemotherapy with carboplatin for an AUC of 5 given on day 1 and etoposide at 120 mg per meter squared given on days 1, 2 and 3 with Neulasta support on day 4. Status post 1 cycle  DISEASE STAGE:  Small cell lung cancer, extensive stage   Primary site: Lung (Left)   Staging method: AJCC 7th Edition   Clinical: Stage IV (T2a, N1, M1b) signed by Curt Bears, MD on 03/03/2014  4:16 PM   Summary: Stage IV (T2a, N1, M1b)  CHEMOTHERAPY INTENT: Palliative  CURRENT # OF CHEMOTHERAPY CYCLES: 1  CURRENT ANTIEMETICS: Zofran, dexamethasone, Compazine  CURRENT SMOKING STATUS: current smoker  ORAL CHEMOTHERAPY AND CONSENT: n/a  CURRENT BISPHOSPHONATES USE: none  PAIN MANAGEMENT: none  NARCOTICS INDUCED CONSTIPATION: none  LIVING WILL AND CODE STATUS: ?   INTERVAL HISTORY: Kerry Peck 53 y.o. male returns for a scheduled regular symptom management visit for followup of his recently diagnosed extensive stage small cell lung cancer. He is status post one cycle of systemic chemotherapy with carboplatin and etoposide with Neulasta support. Overall he tolerated his first cycle of chemotherapy without difficulty. Specifically he had no issues with fever, chills, nausea, vomiting, diarrhea or constipation. He denied significant cough or shortness of breath. He felt well enough after chemotherapy that he feels as though he could return to work. He does plan to  return to work and will adjust his schedule as needed depending on his symptoms.  MEDICAL HISTORY: Past Medical History  Diagnosis Date  . Diabetes mellitus without complication   . Hypertension   . Smoking addiction     ALLERGIES:  has No Known Allergies.  MEDICATIONS:  Current Outpatient Prescriptions  Medication Sig Dispense Refill  . albuterol (PROVENTIL HFA;VENTOLIN HFA) 108 (90 BASE) MCG/ACT inhaler Inhale 2 puffs into the lungs every 6 (six) hours as needed for wheezing.  1 Inhaler  0  . amLODipine (NORVASC) 10 MG tablet Take 10 mg by mouth daily.      Marland Kitchen aspirin EC 81 MG tablet Take 81 mg by mouth daily.      Marland Kitchen atorvastatin (LIPITOR) 40 MG tablet Take 40 mg by mouth daily.      . calcium-vitamin D (OSCAL WITH D) 500-200 MG-UNIT per tablet Take 1 tablet by mouth.      . fenofibrate 160 MG tablet Take 160 mg by mouth daily.      . Fluticasone-Salmeterol (ADVAIR) 250-50 MCG/DOSE AEPB Inhale 1 puff into the lungs every 12 (twelve) hours.      . metFORMIN (GLUCOPHAGE) 500 MG tablet Take 500 mg by mouth daily with breakfast.       . metoprolol tartrate (LOPRESSOR) 25 MG tablet Take 25 mg by mouth daily.       . Multiple Vitamins-Minerals (CENTRUM SILVER ADULT 50+ PO) Take by mouth.      . predniSONE (DELTASONE) 20 MG tablet 2 daily with food  10 tablet  1  .  prochlorperazine (COMPAZINE) 10 MG tablet Take 1 tablet (10 mg total) by mouth every 6 (six) hours as needed for nausea or vomiting.  30 tablet  0  . pyridOXINE (VITAMIN B-6) 100 MG tablet Take 100 mg by mouth daily.      . vitamin C (ASCORBIC ACID) 500 MG tablet Take 500 mg by mouth daily.       No current facility-administered medications for this visit.    SURGICAL HISTORY: History reviewed. No pertinent past surgical history.  REVIEW OF SYSTEMS:  A comprehensive review of systems was negative.   PHYSICAL EXAMINATION: General appearance: alert, cooperative, appears stated age and no distress Head: Normocephalic, without  obvious abnormality, atraumatic Neck: no adenopathy, no carotid bruit, no JVD, supple, symmetrical, trachea midline and thyroid not enlarged, symmetric, no tenderness/mass/nodules Lymph nodes: Cervical, supraclavicular, and axillary nodes normal. Resp: clear to auscultation bilaterally Back: symmetric, no curvature. ROM normal. No CVA tenderness. Cardio: regular rate and rhythm, S1, S2 normal, no murmur, click, rub or gallop GI: soft, non-tender; bowel sounds normal; no masses,  no organomegaly Extremities: extremities normal, atraumatic, no cyanosis or edema Neurologic: Alert and oriented X 3, normal strength and tone. Normal symmetric reflexes. Normal coordination and gait  ECOG PERFORMANCE STATUS: 0 - Asymptomatic  Blood pressure 110/58, pulse 84, temperature 98.2 F (36.8 C), temperature source Oral, resp. rate 18, height 6\' 2"  (1.88 m), weight 225 lb 6.4 oz (102.241 kg), SpO2 98.00%.  LABORATORY DATA: Lab Results  Component Value Date   WBC 10.4* 03/14/2014   HGB 13.6 03/14/2014   HCT 40.4 03/14/2014   MCV 96.4 03/14/2014   PLT 200 03/14/2014      Chemistry      Component Value Date/Time   NA 142 03/14/2014 0935   NA 139 01/30/2007 2027   K 3.8 03/14/2014 0935   K 4.1 01/30/2007 2027   CL 106 01/30/2007 2027   CO2 27 03/14/2014 0935   BUN 24.1 03/14/2014 0935   BUN 7 01/30/2007 2027   CREATININE 0.8 03/14/2014 0935   CREATININE 0.8 01/30/2007 2027      Component Value Date/Time   CALCIUM 10.0 03/14/2014 0935   ALKPHOS 91 03/14/2014 0935   AST 21 03/14/2014 0935   ALT 22 03/14/2014 0935   BILITOT 1.01 03/14/2014 0935       RADIOGRAPHIC STUDIES:  US Biopsy  02/21/2014   CLINICAL DATA:  53 year old with a left lung lesion and concern for metastatic disease. Suspicious liver lesion on recent PET-CT.  EXAM: ULTRASOUND-GUIDED LIVER LESION BIOPSY  Physician: Stephan Minister. Anselm Pancoast, MD  FLUOROSCOPY TIME:  None  MEDICATIONS: 1.5 mg versed, 75 mcg fentanyl. A radiology nurse monitored the patient for  moderate sedation.  ANESTHESIA/SEDATION: Moderate sedation time: 17 min  PROCEDURE: The procedure was explained to the patient. The risks and benefits of the procedure were discussed and the patient's questions were addressed. Informed consent was obtained from the patient. The liver was evaluated with ultrasound. A few heterogeneous small liver lesions were identified with ultrasound. A lesion in the right hepatic lobe adjacent to the gallbladder and just below the costal margin was targeted. The right abdomen was prepped with Betadine and a sterile field was created. Skin was anesthetized with 1% lidocaine. Using ultrasound guidance, a 17 gauge needle was directed into the small lesion. A total of 3 core biopsies were obtained with an 18 gauge core device. Samples were placed in formalin. Needle was removed without complication. Bandage placed over the puncture site.  FINDINGS: At least three heterogeneous lesions were identified in the liver. The largest is in the central right hepatic lobe and appears to correspond with the recent PET-CT imaging. A 6 mm lesion in the right hepatic lobe adjacent to the gallbladder was sampled.  COMPLICATIONS: None  IMPRESSION: Ultrasound-guided core biopsies of a small right hepatic lesion.  Subtle heterogeneous lesions in the liver are suggestive for metastatic disease.   Electronically Signed   By: Markus Daft M.D.   On: 02/21/2014 15:30     ASSESSMENT/PLAN: Patient is a very pleasant 53 year old Caucasian male recently diagnosed with extensive stage small cell lung cancer. He is currently being treated with systemic chemotherapy in the form of carboplatin for an AUC of 5 given on day 1 and etoposide at 120 mg per meter square given on days 1, 2 and 3 with Neulasta support given on day 4. He tolerated his first cycle of chemotherapy relatively well. Patient was discussed with him also seen by Dr. Julien Nordmann. He will continue with weekly labs and return in 2 weeks prior to the  start of cycle #2     Wynetta Emery, Makinze Jani E, PA-C All questions were answered. The patient knows to call the clinic with any problems, questions or concerns. We can certainly see the patient much sooner if necessary.  ADDENDUM: Hematology/Oncology attending: I had a face to face encounter with the patient. I recommended his care plan. This is a very pleasant 53 years old white male recently diagnosed with extensive stage small cell lung cancer and currently on systemic chemotherapy with carboplatin and etoposide status post 1 cycle. His first cycle started last week and the patient tolerated the first week of his treatment fairly well with no significant adverse effects. I recommended for him to continue with his systemic chemotherapy as scheduled and he would come back for followup visit in 2 weeks for evaluation before starting cycle #2. He was advised to call immediately if he has any concerning symptoms in the interval.  Disclaimer: This note was dictated with voice recognition software. Similar sounding words can inadvertently be transcribed and may not be corrected upon review. [name], MD 03/16/2014

## 2014-03-21 ENCOUNTER — Telehealth: Payer: Self-pay | Admitting: Medical Oncology

## 2014-03-21 ENCOUNTER — Other Ambulatory Visit (HOSPITAL_BASED_OUTPATIENT_CLINIC_OR_DEPARTMENT_OTHER): Payer: Federal, State, Local not specified - PPO

## 2014-03-21 ENCOUNTER — Telehealth: Payer: Self-pay | Admitting: *Deleted

## 2014-03-21 DIAGNOSIS — C3430 Malignant neoplasm of lower lobe, unspecified bronchus or lung: Secondary | ICD-10-CM

## 2014-03-21 DIAGNOSIS — C3492 Malignant neoplasm of unspecified part of left bronchus or lung: Secondary | ICD-10-CM

## 2014-03-21 LAB — COMPREHENSIVE METABOLIC PANEL (CC13)
ALT: 27 U/L (ref 0–55)
AST: 21 U/L (ref 5–34)
Albumin: 3.8 g/dL (ref 3.5–5.0)
Alkaline Phosphatase: 113 U/L (ref 40–150)
Anion Gap: 9 mEq/L (ref 3–11)
BUN: 14.1 mg/dL (ref 7.0–26.0)
CALCIUM: 9.5 mg/dL (ref 8.4–10.4)
CHLORIDE: 106 meq/L (ref 98–109)
CO2: 26 mEq/L (ref 22–29)
Creatinine: 0.8 mg/dL (ref 0.7–1.3)
Glucose: 203 mg/dl — ABNORMAL HIGH (ref 70–140)
POTASSIUM: 3.7 meq/L (ref 3.5–5.1)
SODIUM: 142 meq/L (ref 136–145)
Total Bilirubin: 0.28 mg/dL (ref 0.20–1.20)
Total Protein: 7.2 g/dL (ref 6.4–8.3)

## 2014-03-21 LAB — CBC WITH DIFFERENTIAL/PLATELET
BASO%: 1 % (ref 0.0–2.0)
BASOS ABS: 0.1 10*3/uL (ref 0.0–0.1)
EOS ABS: 0.1 10*3/uL (ref 0.0–0.5)
EOS%: 1.1 % (ref 0.0–7.0)
HEMATOCRIT: 37.2 % — AB (ref 38.4–49.9)
HGB: 12.6 g/dL — ABNORMAL LOW (ref 13.0–17.1)
LYMPH%: 19.3 % (ref 14.0–49.0)
MCH: 32.1 pg (ref 27.2–33.4)
MCHC: 33.9 g/dL (ref 32.0–36.0)
MCV: 94.9 fL (ref 79.3–98.0)
MONO#: 1.4 10*3/uL — AB (ref 0.1–0.9)
MONO%: 10.5 % (ref 0.0–14.0)
NEUT%: 68.1 % (ref 39.0–75.0)
NEUTROS ABS: 9 10*3/uL — AB (ref 1.5–6.5)
PLATELETS: 151 10*3/uL (ref 140–400)
RBC: 3.92 10*6/uL — ABNORMAL LOW (ref 4.20–5.82)
RDW: 12.4 % (ref 11.0–14.6)
WBC: 13.2 10*3/uL — AB (ref 4.0–10.3)
lymph#: 2.6 10*3/uL (ref 0.9–3.3)

## 2014-03-21 NOTE — Telephone Encounter (Signed)
Patient here for 9:30 lab appointment.  Completed walk in form which reads "Bump on chin sores".  1017 noted diagnosis, carbo/VP-16 received 03-10-2014 with last f/u visit on 03-14-2014.  This nurse to lobby at 10:45 and patient has left [facility] building.  Next f/u scheduled for 03-28-2014.

## 2014-03-21 NOTE — Telephone Encounter (Signed)
Small bump on face , like pimple near right jaw/chinline. He expressed clear liquid from it over the weekend and it has now scabbed over. He said it is tender, no redness . He is not shaving due to tenderness. I instructed him to monitor it for now, apply antibiotic ointment and to call for worsening symptoms, fever.

## 2014-03-24 ENCOUNTER — Telehealth: Payer: Self-pay | Admitting: Oncology

## 2014-03-24 ENCOUNTER — Telehealth: Payer: Self-pay | Admitting: Medical Oncology

## 2014-03-24 NOTE — Telephone Encounter (Signed)
Kerry Peck called asking for his MRI results from 03/15/14.  Left a message for Dr. Worthy Flank nurse to call him at [phone].

## 2014-03-24 NOTE — Telephone Encounter (Signed)
Per Dr Julien Nordmann I told pt he had a metasatic lesion on his skull and he is getting chemotherapy for the lesion. He voices understanding.

## 2014-03-27 ENCOUNTER — Ambulatory Visit (INDEPENDENT_AMBULATORY_CARE_PROVIDER_SITE_OTHER): Payer: Federal, State, Local not specified - PPO | Admitting: Family Medicine

## 2014-03-27 VITALS — BP 122/74 | HR 93 | Temp 99.9°F | Resp 18 | Ht 74.0 in | Wt 226.8 lb

## 2014-03-27 DIAGNOSIS — J029 Acute pharyngitis, unspecified: Secondary | ICD-10-CM

## 2014-03-27 DIAGNOSIS — J36 Peritonsillar abscess: Secondary | ICD-10-CM

## 2014-03-27 DIAGNOSIS — D849 Immunodeficiency, unspecified: Secondary | ICD-10-CM

## 2014-03-27 DIAGNOSIS — D899 Disorder involving the immune mechanism, unspecified: Secondary | ICD-10-CM

## 2014-03-27 LAB — POCT CBC
GRANULOCYTE PERCENT: 79.8 % (ref 37–80)
HCT, POC: 38.8 % — AB (ref 43.5–53.7)
Hemoglobin: 12.8 g/dL — AB (ref 14.1–18.1)
Lymph, poc: 2.2 (ref 0.6–3.4)
MCH, POC: 31.7 pg — AB (ref 27–31.2)
MCHC: 32.9 g/dL (ref 31.8–35.4)
MCV: 96.4 fL (ref 80–97)
MID (CBC): 1.2 — AB (ref 0–0.9)
MPV: 7.5 fL (ref 0–99.8)
PLATELET COUNT, POC: 269 10*3/uL (ref 142–424)
POC Granulocyte: 13.3 — AB (ref 2–6.9)
POC LYMPH %: 13.3 % (ref 10–50)
POC MID %: 6.9 %M (ref 0–12)
RBC: 4.03 M/uL — AB (ref 4.69–6.13)
RDW, POC: 12.6 %
WBC: 16.7 10*3/uL — AB (ref 4.6–10.2)

## 2014-03-27 LAB — POCT RAPID STREP A (OFFICE): Rapid Strep A Screen: NEGATIVE

## 2014-03-27 MED ORDER — CLINDAMYCIN HCL 300 MG PO CAPS: 300.0000 mg | ORAL_CAPSULE | Freq: Three times a day (TID) | ORAL | Status: DC

## 2014-03-27 MED ORDER — HYDROCODONE-ACETAMINOPHEN 5-325 MG PO TABS
1.0000 | ORAL_TABLET | Freq: Four times a day (QID) | ORAL | Status: DC | PRN
Start: 2014-03-27 — End: 2014-09-13

## 2014-03-27 NOTE — Progress Notes (Addendum)
This chart was scribed for Kerry Ray, Kerry Peck by Thea Alken, ED Scribe. This patient was seen in room 13 and the patient's care was started at 10:49 AM. Subjective:    Patient ID: Kerry Peck, male    DOB: 01-03-1961, 53 y.o.   MRN: [id number]  Sore Throat  Associated symptoms include ear pain. Pertinent negatives include no congestion.   Chief Complaint  Patient presents with  . Sore Throat    x 3 days and is getting worse.   HPI Comments: Kerry Peck is a 54 y.o. male who presents to the [facility] complaining of a worsening sore throat. Pt has h/o multiple problems including small cell lung cancer and DM. Treated with oral medications. Under treatment with radiation and chemo therapy. Last chemo thearpy was July 19th. Next appointment with oncologist is tomorrow with next cycle of treatment. Pt has chemo treatment every 3rd week.   Today pt c/o a worsening, radiating sore throat that began 3 days ago. Pt reports sore throat worsened 2 days ago, got better the following day but then worsened last night causing trouble with sleep. Pt reports soreness radiates to left ear without popping, clicking and decrease in hearing. Pt has taken Chloraseptic. Pt reports sugars have gone up some but has an appointment with doctor tomorrow. Pt denies cough, fever, congestion, tooth pain and rhinorrhea.   Patient Active Problem List   Diagnosis Date Noted  . Small cell lung cancer 03/03/2014  . Mass of lower lobe of left lung 02/01/2014  . Hypertension 08/09/2012  . Hyperlipidemia 08/09/2012  . Diabetes mellitus 08/09/2012  . Overweight 08/09/2012  . Nicotine addiction 08/09/2012  . Reactive airway disease 08/09/2012   Past Medical History  Diagnosis Date  . Diabetes mellitus without complication   . Hypertension   . Smoking addiction   . Cancer     lung and liver   History reviewed. No pertinent past surgical history. No Known Allergies Prior to Admission  medications   Medication Sig Start Date End Date Taking? Authorizing Provider  albuterol (PROVENTIL HFA;VENTOLIN HFA) 108 (90 BASE) MCG/ACT inhaler Inhale 2 puffs into the lungs every 6 (six) hours as needed for wheezing. 08/09/12  Yes Leandrew Koyanagi, Kerry Peck  amLODipine (NORVASC) 10 MG tablet Take 10 mg by mouth daily.   Yes Historical Provider, Kerry Peck  aspirin EC 81 MG tablet Take 81 mg by mouth daily.   Yes Historical Provider, Kerry Peck  atorvastatin (LIPITOR) 40 MG tablet Take 40 mg by mouth daily.   Yes Historical Provider, Kerry Peck  calcium-vitamin D (OSCAL WITH D) 500-200 MG-UNIT per tablet Take 1 tablet by mouth.   Yes Historical Provider, Kerry Peck  fenofibrate 160 MG tablet Take 160 mg by mouth daily.   Yes Historical Provider, Kerry Peck  Fluticasone-Salmeterol (ADVAIR) 250-50 MCG/DOSE AEPB Inhale 1 puff into the lungs every 12 (twelve) hours.   Yes Historical Provider, Kerry Peck  metFORMIN (GLUCOPHAGE) 500 MG tablet Take 500 mg by mouth daily with breakfast.    Yes Historical Provider, Kerry Peck  metoprolol tartrate (LOPRESSOR) 25 MG tablet Take 25 mg by mouth daily.    Yes Historical Provider, Kerry Peck  Multiple Vitamins-Minerals (CENTRUM SILVER ADULT 50+ PO) Take by mouth.   Yes Historical Provider, Kerry Peck  predniSONE (DELTASONE) 20 MG tablet 2 daily with food 01/09/14  Yes Robyn Haber, Kerry Peck  prochlorperazine (COMPAZINE) 10 MG tablet Take 1 tablet (10 mg total) by mouth every 6 (six) hours as needed for nausea or vomiting. 03/03/14  Yes Curt Bears, Kerry Peck  pyridOXINE (VITAMIN B-6) 100 MG tablet Take 100 mg by mouth daily.   Yes Historical Provider, Kerry Peck  vitamin C (ASCORBIC ACID) 500 MG tablet Take 500 mg by mouth daily.   Yes Historical Provider, Kerry Peck   History   Social History  . Marital Status: Single    Spouse Name: N/A    Number of Children: N/A  . Years of Education: N/A   Occupational History  . Not on file.   Social History Main Topics  . Smoking status: Former Smoker -- 0.75 packs/day for 30 years    Types: Cigarettes  .  Smokeless tobacco: Never Used  . Alcohol Use: 18.0 oz/week    30 Cans of beer per week     Comment: 6 beers per day x 5 days per week  . Drug Use: No  . Sexual Activity: Yes   Other Topics Concern  . Not on file   Social History Narrative  . No narrative on file   Review of Systems  Constitutional: Negative for fever and chills.  HENT: Positive for ear pain and sore throat. Negative for congestion and rhinorrhea.    Objective:   Physical Exam  Nursing note and vitals reviewed. Constitutional: He is oriented to person, place, and time. He appears well-developed and well-nourished. No distress.  HENT:  Head: Normocephalic and atraumatic.  Mouth/Throat: Oropharyngeal exudate and posterior oropharyngeal erythema present.  Post oropharyngeal exudate left greater right with prominence. Slight shift of uvula left to right.  Eyes: Conjunctivae and EOM are normal.  Neck: Neck supple.  Cardiovascular: Normal rate.   Pulmonary/Chest: Effort normal.  Musculoskeletal: Normal range of motion.  Lymphadenopathy:  Slight prominence to [employer] lymph nodes  Neurological: He is alert and oriented to person, place, and time.  Skin: Skin is warm and dry.  Psychiatric: He has a normal mood and affect. His behavior is normal.    Filed Vitals:   03/27/14 0926  BP: 122/74  Pulse: 93  Temp: 99.9 F (37.7 C)  TempSrc: Oral  Resp: 18  Height: 6\' 2"  (1.88 m)  Weight: 226 lb 12.8 oz (102.876 kg)  SpO2: 97%   Results for orders placed in visit on 03/27/14  POCT RAPID STREP A (OFFICE)      Result Value Ref Range   Rapid Strep A Screen Negative  Negative  POCT CBC      Result Value Ref Range   WBC 16.7 (*) 4.6 - 10.2 K/uL   Lymph, poc 2.2  0.6 - 3.4   POC LYMPH PERCENT 13.3  10 - 50 %L   MID (cbc) 1.2 (*) 0 - 0.9   POC MID % 6.9  0 - 12 %M   POC Granulocyte 13.3 (*) 2 - 6.9   Granulocyte percent 79.8  37 - 80 %G   RBC 4.03 (*) 4.69 - 6.13 M/uL   Hemoglobin 12.8 (*) 14.1 - 18.1 g/dL   HCT,  POC 38.8 (*) 43.5 - 53.7 %   MCV 96.4  80 - 97 fL   MCH, POC 31.7 (*) 27 - 31.2 pg   MCHC 32.9  31.8 - 35.4 g/dL   RDW, POC 12.6     Platelet Count, POC 269  142 - 424 K/uL   MPV 7.5  0 - 99.8 fL    Assessment & Plan:    Kerry Peck is a 53 y.o. male Sore throat - Plan: POCT rapid strep A, Culture, Group A Strep, POCT  CBC, clindamycin (CLEOCIN) 300 MG capsule, Ambulatory referral to ENT, HYDROcodone-acetaminophen (NORCO/VICODIN) 5-325 MG per tablet  Immunosuppressed status - Plan: POCT CBC, clindamycin (CLEOCIN) 300 MG capsule, Ambulatory referral to ENT  Tonsillar abscess - Plan: clindamycin (CLEOCIN) 300 MG capsule, Ambulatory referral to ENT, HYDROcodone-acetaminophen (NORCO/VICODIN) 5-325 MG per tablet Early L peritonsillar abscess.  Suspected false negative rapid strep. Underlying immune suppression possible with prior chemotherapy. Discussed with ENT.  Will start clindamycin 300mg  TID, and have evaluated in their office tomorrow to determine if aspiration needed. Ibuprofen, or if needed -rx for hydrocodone given. Er precautions discussed prior to ENT eval in am.   Meds ordered this encounter  Medications  . clindamycin (CLEOCIN) 300 MG capsule    Sig: Take 1 capsule (300 mg total) by mouth 3 (three) times daily.    Dispense:  30 capsule    Refill:  0  . HYDROcodone-acetaminophen (NORCO/VICODIN) 5-325 MG per tablet    Sig: Take 1 tablet by mouth every 6 (six) hours as needed for moderate pain.    Dispense:  15 tablet    Refill:  0   Patient Instructions  Start antibiotic today, and we will call you to be evaluated by Ear Nose and Throat specialist tomorrow to determine if this area needs to be drained. Return to the clinic or go to the nearest emergency room if any of your symptoms worsen or new symptoms occur. Ibuprofen for soreness, and if needed for pain - hydrocodone.    [facility] ENT:  [facility]: [address] [place], [place]  [address] [phone] phone          I personally performed the services described in this documentation, which was scribed in my presence. The recorded information has been reviewed and considered, and addended by me as needed.

## 2014-03-27 NOTE — Patient Instructions (Signed)
Start antibiotic today, and we will call you to be evaluated by Ear Nose and Throat specialist tomorrow to determine if this area needs to be drained. Return to the clinic or go to the nearest emergency room if any of your symptoms worsen or new symptoms occur. Ibuprofen for soreness, and if needed for pain - hydrocodone.    [facility] ENT:  [facility]: [address] [place], [place] [address] [phone] phone

## 2014-03-28 ENCOUNTER — Other Ambulatory Visit: Payer: Federal, State, Local not specified - PPO

## 2014-03-29 ENCOUNTER — Encounter: Payer: Self-pay | Admitting: Physician Assistant

## 2014-03-29 ENCOUNTER — Other Ambulatory Visit: Payer: Federal, State, Local not specified - PPO

## 2014-03-29 ENCOUNTER — Ambulatory Visit: Payer: Federal, State, Local not specified - PPO

## 2014-03-29 ENCOUNTER — Ambulatory Visit (HOSPITAL_BASED_OUTPATIENT_CLINIC_OR_DEPARTMENT_OTHER): Payer: Federal, State, Local not specified - PPO | Admitting: Physician Assistant

## 2014-03-29 ENCOUNTER — Other Ambulatory Visit: Payer: Self-pay | Admitting: Internal Medicine

## 2014-03-29 ENCOUNTER — Other Ambulatory Visit: Payer: Self-pay

## 2014-03-29 ENCOUNTER — Telehealth: Payer: Self-pay | Admitting: Internal Medicine

## 2014-03-29 ENCOUNTER — Ambulatory Visit (HOSPITAL_BASED_OUTPATIENT_CLINIC_OR_DEPARTMENT_OTHER): Payer: Federal, State, Local not specified - PPO

## 2014-03-29 ENCOUNTER — Other Ambulatory Visit (HOSPITAL_BASED_OUTPATIENT_CLINIC_OR_DEPARTMENT_OTHER): Payer: Federal, State, Local not specified - PPO

## 2014-03-29 VITALS — BP 127/74 | HR 76 | Temp 98.9°F | Resp 18 | Ht 74.0 in | Wt 229.3 lb

## 2014-03-29 DIAGNOSIS — C3490 Malignant neoplasm of unspecified part of unspecified bronchus or lung: Secondary | ICD-10-CM

## 2014-03-29 DIAGNOSIS — Z5111 Encounter for antineoplastic chemotherapy: Secondary | ICD-10-CM

## 2014-03-29 DIAGNOSIS — C3492 Malignant neoplasm of unspecified part of left bronchus or lung: Secondary | ICD-10-CM

## 2014-03-29 DIAGNOSIS — C787 Secondary malignant neoplasm of liver and intrahepatic bile duct: Secondary | ICD-10-CM

## 2014-03-29 DIAGNOSIS — C7951 Secondary malignant neoplasm of bone: Secondary | ICD-10-CM

## 2014-03-29 DIAGNOSIS — C7952 Secondary malignant neoplasm of bone marrow: Secondary | ICD-10-CM

## 2014-03-29 DIAGNOSIS — C3430 Malignant neoplasm of lower lobe, unspecified bronchus or lung: Secondary | ICD-10-CM

## 2014-03-29 LAB — CBC WITH DIFFERENTIAL/PLATELET
BASO%: 0.8 % (ref 0.0–2.0)
Basophils Absolute: 0.1 10*3/uL (ref 0.0–0.1)
EOS%: 0.4 % (ref 0.0–7.0)
Eosinophils Absolute: 0.1 10*3/uL (ref 0.0–0.5)
HCT: 35.1 % — ABNORMAL LOW (ref 38.4–49.9)
HGB: 11.6 g/dL — ABNORMAL LOW (ref 13.0–17.1)
LYMPH%: 17 % (ref 14.0–49.0)
MCH: 31.5 pg (ref 27.2–33.4)
MCHC: 33 g/dL (ref 32.0–36.0)
MCV: 95.3 fL (ref 79.3–98.0)
MONO#: 1.4 10*3/uL — ABNORMAL HIGH (ref 0.1–0.9)
MONO%: 10.1 % (ref 0.0–14.0)
NEUT#: 9.9 10*3/uL — ABNORMAL HIGH (ref 1.5–6.5)
NEUT%: 71.7 % (ref 39.0–75.0)
Platelets: 322 10*3/uL (ref 140–400)
RBC: 3.68 10*6/uL — AB (ref 4.20–5.82)
RDW: 12.7 % (ref 11.0–14.6)
WBC: 13.8 10*3/uL — ABNORMAL HIGH (ref 4.0–10.3)
lymph#: 2.3 10*3/uL (ref 0.9–3.3)

## 2014-03-29 LAB — COMPREHENSIVE METABOLIC PANEL (CC13)
ALBUMIN: 3.6 g/dL (ref 3.5–5.0)
ALK PHOS: 123 U/L (ref 40–150)
ALT: 18 U/L (ref 0–55)
AST: 18 U/L (ref 5–34)
Anion Gap: 9 mEq/L (ref 3–11)
BUN: 9.7 mg/dL (ref 7.0–26.0)
CO2: 26 mEq/L (ref 22–29)
Calcium: 9.7 mg/dL (ref 8.4–10.4)
Chloride: 103 mEq/L (ref 98–109)
Creatinine: 0.8 mg/dL (ref 0.7–1.3)
Glucose: 345 mg/dl — ABNORMAL HIGH (ref 70–140)
POTASSIUM: 4.1 meq/L (ref 3.5–5.1)
SODIUM: 138 meq/L (ref 136–145)
TOTAL PROTEIN: 7.5 g/dL (ref 6.4–8.3)
Total Bilirubin: 0.32 mg/dL (ref 0.20–1.20)

## 2014-03-29 LAB — CULTURE, GROUP A STREP: Organism ID, Bacteria: NORMAL

## 2014-03-29 MED ORDER — SODIUM CHLORIDE 0.9 % IV SOLN
Freq: Once | INTRAVENOUS | Status: AC
  Administered 2014-03-29: 13:00:00 via INTRAVENOUS

## 2014-03-29 MED ORDER — ONDANSETRON 16 MG/50ML IVPB (CHCC)
16.0000 mg | Freq: Once | INTRAVENOUS | Status: AC
  Administered 2014-03-29: 16 mg via INTRAVENOUS

## 2014-03-29 MED ORDER — CARBOPLATIN CHEMO INJECTION 600 MG/60ML
750.0000 mg | Freq: Once | INTRAVENOUS | Status: AC
  Administered 2014-03-29: 750 mg via INTRAVENOUS
  Filled 2014-03-29: qty 75

## 2014-03-29 MED ORDER — ETOPOSIDE CHEMO INJECTION 1 GM/50ML
120.0000 mg/m2 | Freq: Once | INTRAVENOUS | Status: AC
  Administered 2014-03-29: 280 mg via INTRAVENOUS
  Filled 2014-03-29: qty 14

## 2014-03-29 MED ORDER — ONDANSETRON 16 MG/50ML IVPB (CHCC)
INTRAVENOUS | Status: AC
  Filled 2014-03-29: qty 16

## 2014-03-29 MED ORDER — DEXAMETHASONE SODIUM PHOSPHATE 20 MG/5ML IJ SOLN
INTRAMUSCULAR | Status: AC
  Filled 2014-03-29: qty 5

## 2014-03-29 MED ORDER — DEXAMETHASONE SODIUM PHOSPHATE 20 MG/5ML IJ SOLN
20.0000 mg | Freq: Once | INTRAMUSCULAR | Status: AC
  Administered 2014-03-29: 20 mg via INTRAVENOUS

## 2014-03-29 NOTE — Telephone Encounter (Signed)
Pt confirmed labs/ov per 08/04 POF, gave pt AVS...Marland KitchenMarland KitchenKJ

## 2014-03-29 NOTE — Patient Instructions (Signed)
[facility] Discharge Instructions for Patients Receiving Chemotherapy  Today you received the following chemotherapy agents: Carboplatin, VP-16.  To help prevent nausea and vomiting after your treatment, we encourage you to take your nausea medication.   If you develop nausea and vomiting that is not controlled by your nausea medication, call the clinic.   BELOW ARE SYMPTOMS THAT SHOULD BE REPORTED IMMEDIATELY:  *FEVER GREATER THAN 100.5 F  *CHILLS WITH OR WITHOUT FEVER  NAUSEA AND VOMITING THAT IS NOT CONTROLLED WITH YOUR NAUSEA MEDICATION  *UNUSUAL SHORTNESS OF BREATH  *UNUSUAL BRUISING OR BLEEDING  TENDERNESS IN MOUTH AND THROAT WITH OR WITHOUT PRESENCE OF ULCERS  *URINARY PROBLEMS  *BOWEL PROBLEMS  UNUSUAL RASH Items with * indicate a potential emergency and should be followed up as soon as possible.  Feel free to call the clinic you have any questions or concerns. The clinic phone number is [phone].

## 2014-03-29 NOTE — Progress Notes (Addendum)
No images are attached to the encounter. No scans are attached to the encounter. No scans are attached to the encounter. Kerry Peck VISIT PROGRESS NOTE  Kerry Amel, MD [address]  [place] [address]  DIAGNOSIS: Small cell lung cancer, extensive stage   Primary site: Lung (Left)   Staging method: AJCC 7th Edition   Clinical: Stage IV (T2a, N1, M1b) signed by Curt Bears, MD on 03/03/2014  4:16 PM   Summary: Stage IV (T2a, N1, M1b)  PRIOR THERAPY: None  CURRENT THERAPY: Systemic chemotherapy with carboplatin for an AUC of 5 given on day 1 and etoposide at 120 mg per meter squared given on days 1, 2 and 3 with Neulasta support on day 4. Status post 1 cycle  DISEASE STAGE:  Small cell lung cancer, extensive stage   Primary site: Lung (Left)   Staging method: AJCC 7th Edition   Clinical: Stage IV (T2a, N1, M1b) signed by Curt Bears, MD on 03/03/2014  4:16 PM   Summary: Stage IV (T2a, N1, M1b)  CHEMOTHERAPY INTENT: Palliative  CURRENT # OF CHEMOTHERAPY CYCLES: 1  CURRENT ANTIEMETICS: Zofran, dexamethasone, Compazine  CURRENT SMOKING STATUS: current smoker  ORAL CHEMOTHERAPY AND CONSENT: n/a  CURRENT BISPHOSPHONATES USE: none  PAIN MANAGEMENT: none  NARCOTICS INDUCED CONSTIPATION: none  LIVING WILL AND CODE STATUS: ?   INTERVAL HISTORY: Kerry Peck 53 y.o. male returns for a followup visit prior to his next scheduled cycle of chemotherapy.Marland Kitchen He is status post one cycle of systemic chemotherapy with carboplatin and etoposide with Neulasta support. Overall he tolerated his first cycle of chemotherapy without difficulty. Specifically he had no issues with fever, chills, nausea, vomiting, diarrhea or constipation. He denied significant cough or shortness of breath. He felt well enough after chemotherapy that he feels as though he could return to work. He does plan to return to work and will adjust his schedule as needed  depending on his symptoms. He reports that he went to the urgent care Center on Sunday and was diagnosed with strep throat. He is currently on antibiotics specifically Cleocin 300 mg 3 times daily her on till he completes the prescription. He denies any fever or chills. He was also evaluated by his primary care physician and who also were recommended that he complete his course of antibiotics. He wishes to proceed with chemotherapy today for possible.  MEDICAL HISTORY: Past Medical History  Diagnosis Date  . Diabetes mellitus without complication   . Hypertension   . Smoking addiction   . Cancer     lung and liver    ALLERGIES:  has No Known Allergies.  MEDICATIONS:  Current Outpatient Prescriptions  Medication Sig Dispense Refill  . albuterol (PROVENTIL HFA;VENTOLIN HFA) 108 (90 BASE) MCG/ACT inhaler Inhale 2 puffs into the lungs every 6 (six) hours as needed for wheezing.  1 Inhaler  0  . amLODipine (NORVASC) 10 MG tablet Take 10 mg by mouth daily.      Marland Kitchen aspirin EC 81 MG tablet Take 81 mg by mouth daily.      Marland Kitchen atorvastatin (LIPITOR) 40 MG tablet Take 40 mg by mouth daily.      . calcium-vitamin D (OSCAL WITH D) 500-200 MG-UNIT per tablet Take 1 tablet by mouth.      . clindamycin (CLEOCIN) 300 MG capsule Take 1 capsule (300 mg total) by mouth 3 (three) times daily.  30 capsule  0  . fenofibrate 160 MG tablet Take 160 mg by  mouth daily.      . Fluticasone-Salmeterol (ADVAIR) 250-50 MCG/DOSE AEPB Inhale 1 puff into the lungs every 12 (twelve) hours.      Marland Kitchen HYDROcodone-acetaminophen (NORCO/VICODIN) 5-325 MG per tablet Take 1 tablet by mouth every 6 (six) hours as needed for moderate pain.  15 tablet  0  . metFORMIN (GLUCOPHAGE) 500 MG tablet Take 500 mg by mouth daily with breakfast.       . metoprolol tartrate (LOPRESSOR) 25 MG tablet Take 25 mg by mouth daily.       . Multiple Vitamins-Minerals (CENTRUM SILVER ADULT 50+ PO) Take by mouth.      . predniSONE (DELTASONE) 20 MG tablet 2  daily with food  10 tablet  1  . prochlorperazine (COMPAZINE) 10 MG tablet Take 1 tablet (10 mg total) by mouth every 6 (six) hours as needed for nausea or vomiting.  30 tablet  0  . pyridOXINE (VITAMIN B-6) 100 MG tablet Take 100 mg by mouth daily.      . vitamin C (ASCORBIC ACID) 500 MG tablet Take 500 mg by mouth daily.       No current facility-administered medications for this visit.   Facility-Administered Medications Ordered in Other Visits  Medication Dose Route Frequency Provider Last Rate Last Dose  . etoposide (VEPESID) 280 mg in sodium chloride 0.9 % 700 mL chemo infusion  120 mg/m2 (Treatment Plan Actual) Intravenous Once Carlton Adam, PA-C 714 mL/hr at 03/29/14 1447 280 mg at 03/29/14 1447    SURGICAL HISTORY: History reviewed. No pertinent past surgical history.  REVIEW OF SYSTEMS:  A comprehensive review of systems was negative except for: Ears, nose, mouth, throat, and face: positive for sore throat and Recent diagnosis of strep throat, currently on antibiotics. No current fever or chills   PHYSICAL EXAMINATION: General appearance: alert, cooperative, appears stated age and no distress Head: Normocephalic, without obvious abnormality, atraumatic Neck: no carotid bruit, no JVD, supple, symmetrical, trachea midline, thyroid not enlarged, symmetric, no tenderness/mass/nodules and 1+ non-tender cervical adenopathy Lymph nodes: Cervical adenopathy: 1+ non-tender Resp: clear to auscultation bilaterally Back: symmetric, no curvature. ROM normal. No CVA tenderness. Cardio: regular rate and rhythm, S1, S2 normal, no murmur, click, rub or gallop GI: soft, non-tender; bowel sounds normal; no masses,  no organomegaly Extremities: extremities normal, atraumatic, no cyanosis or edema Neurologic: Alert and oriented X 3, normal strength and tone. Normal symmetric reflexes. Normal coordination and gait Oropharynx: Posterior oropharynx hyperemic with tonsillar enlargement and erythema  with pockets of purulent material.  ECOG PERFORMANCE STATUS: 0 - Asymptomatic  Blood pressure 127/74, pulse 76, temperature 98.9 F (37.2 C), temperature source Oral, resp. rate 18, height 6\' 2"  (1.88 m), weight 229 lb 4.8 oz (104.01 kg), SpO2 98.00%.  LABORATORY DATA: Lab Results  Component Value Date   WBC 13.8* 03/29/2014   HGB 11.6* 03/29/2014   HCT 35.1* 03/29/2014   MCV 95.3 03/29/2014   PLT 322 03/29/2014      Chemistry      Component Value Date/Time   NA 138 03/29/2014 1208   NA 139 01/30/2007 2027   K 4.1 03/29/2014 1208   K 4.1 01/30/2007 2027   CL 106 01/30/2007 2027   CO2 26 03/29/2014 1208   BUN 9.7 03/29/2014 1208   BUN 7 01/30/2007 2027   CREATININE 0.8 03/29/2014 1208   CREATININE 0.8 01/30/2007 2027      Component Value Date/Time   CALCIUM 9.7 03/29/2014 1208   ALKPHOS 123 03/29/2014 1208  AST 18 03/29/2014 1208   ALT 18 03/29/2014 1208   BILITOT 0.32 03/29/2014 1208       RADIOGRAPHIC STUDIES:  US Biopsy  02/21/2014   CLINICAL DATA:  53 year old with a left lung lesion and concern for metastatic disease. Suspicious liver lesion on recent PET-CT.  EXAM: ULTRASOUND-GUIDED LIVER LESION BIOPSY  Physician: Stephan Minister. Anselm Pancoast, MD  FLUOROSCOPY TIME:  None  MEDICATIONS: 1.5 mg versed, 75 mcg fentanyl. A radiology nurse monitored the patient for moderate sedation.  ANESTHESIA/SEDATION: Moderate sedation time: 17 min  PROCEDURE: The procedure was explained to the patient. The risks and benefits of the procedure were discussed and the patient's questions were addressed. Informed consent was obtained from the patient. The liver was evaluated with ultrasound. A few heterogeneous small liver lesions were identified with ultrasound. A lesion in the right hepatic lobe adjacent to the gallbladder and just below the costal margin was targeted. The right abdomen was prepped with Betadine and a sterile field was created. Skin was anesthetized with 1% lidocaine. Using ultrasound guidance, a 17 gauge needle was directed  into the small lesion. A total of 3 core biopsies were obtained with an 18 gauge core device. Samples were placed in formalin. Needle was removed without complication. Bandage placed over the puncture site.  FINDINGS: At least three heterogeneous lesions were identified in the liver. The largest is in the central right hepatic lobe and appears to correspond with the recent PET-CT imaging. A 6 mm lesion in the right hepatic lobe adjacent to the gallbladder was sampled.  COMPLICATIONS: None  IMPRESSION: Ultrasound-guided core biopsies of a small right hepatic lesion.  Subtle heterogeneous lesions in the liver are suggestive for metastatic disease.   Electronically Signed   By: Markus Daft M.D.   On: 02/21/2014 15:30     ASSESSMENT/PLAN: Patient is a very pleasant 53 year old Caucasian male recently diagnosed with extensive stage small cell lung cancer. He is currently being treated with systemic chemotherapy in the form of carboplatin for an AUC of 5 given on day 1 and etoposide at 120 mg per meter square given on days 1, 2 and 3 with Neulasta support given on day 4. He tolerated his first cycle of chemotherapy relatively well. Patient was discussed with him also seen by Dr. Julien Nordmann. We're aware of his recent diagnosis of strep throat and is currently asymptomatic on a course of antibiotics. His counts are adequate to proceed with cycle #2 of his systemic chemotherapy with carboplatin and etoposide with Neulasta support. Patient was strongly advised to complete his total course of antibiotics as prescribed. He voiced understanding. He'll continue with weekly labs as scheduled. A follow up with Dr. Julien Nordmann in 3 weeks with restaging CT scan of his chest, abdomen and pelvis with contrast to reevaluate his disease.   All questions were answered. The patient knows to call the clinic with any problems, questions or concerns. We can certainly see the patient much sooner if necessary.  Disclaimer: This note was  dictated with voice recognition software. Similar sounding words can inadvertently be transcribed and may not be corrected upon review. Carlton Adam, PA-C 03/29/2014  ADDENDUM: Hematology/Oncology Attending: I had a face to face encounter with the patient. I recommended his care plan. This is a very pleasant 53 years old white male recently diagnosed with extensive stage small cell lung cancer and currently undergoing systemic chemotherapy with carboplatin and etoposide status post 1 cycle. He tolerated the first cycle of his treatment fairly well with  no significant adverse effects except for mild fatigue. He recently complained of sore throat and currently on treatment with antibiotics. I gave the patient the option of proceeding with his treatment today and close monitoring and continuing treatment with antibiotics versus delaying his treatment by one week. He does not want to delay his treatment. We'll proceed with cycle #2 today as scheduled and the patient would come back for followup visit in 3 weeks with repeat CT scan of the chest, abdomen and pelvis for restaging of his disease. He was strongly advised to call immediately if he has any concerning symptoms in the interval especially any significant fever or chills.  Disclaimer: This note was dictated with voice recognition software. Similar sounding words can inadvertently be transcribed and may be missed upon review. [name], MD 04/08/2014

## 2014-03-30 ENCOUNTER — Ambulatory Visit (HOSPITAL_BASED_OUTPATIENT_CLINIC_OR_DEPARTMENT_OTHER): Payer: Federal, State, Local not specified - PPO

## 2014-03-30 VITALS — BP 120/63 | HR 66 | Temp 97.6°F | Resp 18

## 2014-03-30 DIAGNOSIS — Z5111 Encounter for antineoplastic chemotherapy: Secondary | ICD-10-CM

## 2014-03-30 DIAGNOSIS — C3430 Malignant neoplasm of lower lobe, unspecified bronchus or lung: Secondary | ICD-10-CM

## 2014-03-30 DIAGNOSIS — C3492 Malignant neoplasm of unspecified part of left bronchus or lung: Secondary | ICD-10-CM

## 2014-03-30 DIAGNOSIS — C787 Secondary malignant neoplasm of liver and intrahepatic bile duct: Secondary | ICD-10-CM

## 2014-03-30 MED ORDER — DEXAMETHASONE SODIUM PHOSPHATE 10 MG/ML IJ SOLN
INTRAMUSCULAR | Status: AC
  Filled 2014-03-30: qty 1

## 2014-03-30 MED ORDER — SODIUM CHLORIDE 0.9 % IV SOLN
Freq: Once | INTRAVENOUS | Status: AC
  Administered 2014-03-30: 13:00:00 via INTRAVENOUS

## 2014-03-30 MED ORDER — ONDANSETRON 8 MG/NS 50 ML IVPB
INTRAVENOUS | Status: AC
  Filled 2014-03-30: qty 8

## 2014-03-30 MED ORDER — SODIUM CHLORIDE 0.9 % IV SOLN
120.0000 mg/m2 | Freq: Once | INTRAVENOUS | Status: AC
  Administered 2014-03-30: 280 mg via INTRAVENOUS
  Filled 2014-03-30: qty 14

## 2014-03-30 MED ORDER — DEXAMETHASONE SODIUM PHOSPHATE 10 MG/ML IJ SOLN
10.0000 mg | Freq: Once | INTRAMUSCULAR | Status: AC
  Administered 2014-03-30: 10 mg via INTRAVENOUS

## 2014-03-30 MED ORDER — ONDANSETRON 8 MG/50ML IVPB (CHCC)
8.0000 mg | Freq: Once | INTRAVENOUS | Status: AC
  Administered 2014-03-30: 8 mg via INTRAVENOUS

## 2014-03-30 NOTE — Patient Instructions (Signed)
[facility] Discharge Instructions for Patients Receiving Chemotherapy  Today you received the following chemotherapy agents:  Etoposide  To help prevent nausea and vomiting after your treatment, we encourage you to take your nausea medication as ordered per MD.   If you develop nausea and vomiting that is not controlled by your nausea medication, call the clinic.   BELOW ARE SYMPTOMS THAT SHOULD BE REPORTED IMMEDIATELY:  *FEVER GREATER THAN 100.5 F  *CHILLS WITH OR WITHOUT FEVER  NAUSEA AND VOMITING THAT IS NOT CONTROLLED WITH YOUR NAUSEA MEDICATION  *UNUSUAL SHORTNESS OF BREATH  *UNUSUAL BRUISING OR BLEEDING  TENDERNESS IN MOUTH AND THROAT WITH OR WITHOUT PRESENCE OF ULCERS  *URINARY PROBLEMS  *BOWEL PROBLEMS  UNUSUAL RASH Items with * indicate a potential emergency and should be followed up as soon as possible.  Feel free to call the clinic you have any questions or concerns. The clinic phone number is [phone].

## 2014-03-31 ENCOUNTER — Ambulatory Visit (HOSPITAL_BASED_OUTPATIENT_CLINIC_OR_DEPARTMENT_OTHER): Payer: Federal, State, Local not specified - PPO

## 2014-03-31 ENCOUNTER — Ambulatory Visit: Payer: Federal, State, Local not specified - PPO

## 2014-03-31 VITALS — BP 123/55 | HR 64 | Temp 97.8°F

## 2014-03-31 DIAGNOSIS — Z5111 Encounter for antineoplastic chemotherapy: Secondary | ICD-10-CM

## 2014-03-31 DIAGNOSIS — C787 Secondary malignant neoplasm of liver and intrahepatic bile duct: Secondary | ICD-10-CM

## 2014-03-31 DIAGNOSIS — C3492 Malignant neoplasm of unspecified part of left bronchus or lung: Secondary | ICD-10-CM

## 2014-03-31 DIAGNOSIS — C3430 Malignant neoplasm of lower lobe, unspecified bronchus or lung: Secondary | ICD-10-CM

## 2014-03-31 MED ORDER — SODIUM CHLORIDE 0.9 % IV SOLN
120.0000 mg/m2 | Freq: Once | INTRAVENOUS | Status: AC
  Administered 2014-03-31: 280 mg via INTRAVENOUS
  Filled 2014-03-31: qty 14

## 2014-03-31 MED ORDER — SODIUM CHLORIDE 0.9 % IV SOLN
Freq: Once | INTRAVENOUS | Status: AC
  Administered 2014-03-31: 15:00:00 via INTRAVENOUS

## 2014-03-31 MED ORDER — DEXAMETHASONE SODIUM PHOSPHATE 10 MG/ML IJ SOLN
10.0000 mg | Freq: Once | INTRAMUSCULAR | Status: AC
  Administered 2014-03-31: 10 mg via INTRAVENOUS

## 2014-03-31 MED ORDER — DEXAMETHASONE SODIUM PHOSPHATE 10 MG/ML IJ SOLN
INTRAMUSCULAR | Status: AC
  Filled 2014-03-31: qty 1

## 2014-03-31 MED ORDER — ONDANSETRON 8 MG/NS 50 ML IVPB
INTRAVENOUS | Status: AC
  Filled 2014-03-31: qty 8

## 2014-03-31 MED ORDER — ONDANSETRON 8 MG/50ML IVPB (CHCC)
8.0000 mg | Freq: Once | INTRAVENOUS | Status: AC
  Administered 2014-03-31: 8 mg via INTRAVENOUS

## 2014-03-31 NOTE — Patient Instructions (Signed)
[facility] Discharge Instructions for Patients Receiving Chemotherapy  Today you received the following chemotherapy agents Etoposide.  To help prevent nausea and vomiting after your treatment, we encourage you to take your nausea medication as prescribed.}   If you develop nausea and vomiting that is not controlled by your nausea medication, call the clinic.   BELOW ARE SYMPTOMS THAT SHOULD BE REPORTED IMMEDIATELY:  *FEVER GREATER THAN 100.5 F  *CHILLS WITH OR WITHOUT FEVER  NAUSEA AND VOMITING THAT IS NOT CONTROLLED WITH YOUR NAUSEA MEDICATION  *UNUSUAL SHORTNESS OF BREATH  *UNUSUAL BRUISING OR BLEEDING  TENDERNESS IN MOUTH AND THROAT WITH OR WITHOUT PRESENCE OF ULCERS  *URINARY PROBLEMS  *BOWEL PROBLEMS  UNUSUAL RASH Items with * indicate a potential emergency and should be followed up as soon as possible.  Feel free to call the clinic you have any questions or concerns. The clinic phone number is [phone].

## 2014-04-01 ENCOUNTER — Ambulatory Visit (HOSPITAL_BASED_OUTPATIENT_CLINIC_OR_DEPARTMENT_OTHER): Payer: Federal, State, Local not specified - PPO

## 2014-04-01 VITALS — BP 124/78 | HR 73 | Temp 98.2°F

## 2014-04-01 DIAGNOSIS — C3492 Malignant neoplasm of unspecified part of left bronchus or lung: Secondary | ICD-10-CM

## 2014-04-01 DIAGNOSIS — C3430 Malignant neoplasm of lower lobe, unspecified bronchus or lung: Secondary | ICD-10-CM

## 2014-04-01 DIAGNOSIS — C787 Secondary malignant neoplasm of liver and intrahepatic bile duct: Secondary | ICD-10-CM

## 2014-04-01 DIAGNOSIS — Z5189 Encounter for other specified aftercare: Secondary | ICD-10-CM

## 2014-04-01 MED ORDER — PEGFILGRASTIM INJECTION 6 MG/0.6ML
6.0000 mg | Freq: Once | SUBCUTANEOUS | Status: AC
  Administered 2014-04-01: 6 mg via SUBCUTANEOUS
  Filled 2014-04-01: qty 0.6

## 2014-04-02 NOTE — Patient Instructions (Signed)
Be sure to take your antibiotics as prescribed Continue labs and chemotherapy as scheduled Followup in 3 weeks with restaging CT scan of the chest, abdomen and pelvis to reevaluate your disease

## 2014-04-04 ENCOUNTER — Other Ambulatory Visit: Payer: Federal, State, Local not specified - PPO

## 2014-04-04 ENCOUNTER — Ambulatory Visit: Payer: Federal, State, Local not specified - PPO | Admitting: Physician Assistant

## 2014-04-05 ENCOUNTER — Other Ambulatory Visit (HOSPITAL_BASED_OUTPATIENT_CLINIC_OR_DEPARTMENT_OTHER): Payer: Federal, State, Local not specified - PPO

## 2014-04-05 DIAGNOSIS — C3430 Malignant neoplasm of lower lobe, unspecified bronchus or lung: Secondary | ICD-10-CM

## 2014-04-05 DIAGNOSIS — C3492 Malignant neoplasm of unspecified part of left bronchus or lung: Secondary | ICD-10-CM

## 2014-04-05 LAB — COMPREHENSIVE METABOLIC PANEL
ALK PHOS: 139 U/L — AB (ref 39–117)
ALT: 17 U/L (ref 0–53)
AST: 13 U/L (ref 0–37)
Albumin: 4 g/dL (ref 3.5–5.2)
BILIRUBIN TOTAL: 0.5 mg/dL (ref 0.2–1.2)
BUN: 10 mg/dL (ref 6–23)
CO2: 27 mEq/L (ref 19–32)
CREATININE: 0.64 mg/dL (ref 0.50–1.35)
Calcium: 8.9 mg/dL (ref 8.4–10.5)
Chloride: 101 mEq/L (ref 96–112)
GLUCOSE: 228 mg/dL — AB (ref 70–99)
Potassium: 3.9 mEq/L (ref 3.5–5.3)
Sodium: 136 mEq/L (ref 135–145)
Total Protein: 6.4 g/dL (ref 6.0–8.3)

## 2014-04-05 LAB — CBC WITH DIFFERENTIAL/PLATELET
BASO%: 0.9 % (ref 0.0–2.0)
BASOS ABS: 0.1 10*3/uL (ref 0.0–0.1)
EOS ABS: 0.1 10*3/uL (ref 0.0–0.5)
EOS%: 1 % (ref 0.0–7.0)
HEMATOCRIT: 32.2 % — AB (ref 38.4–49.9)
HEMOGLOBIN: 10.7 g/dL — AB (ref 13.0–17.1)
LYMPH%: 16 % (ref 14.0–49.0)
MCH: 31.7 pg (ref 27.2–33.4)
MCHC: 33.3 g/dL (ref 32.0–36.0)
MCV: 95.2 fL (ref 79.3–98.0)
MONO#: 0.6 10*3/uL (ref 0.1–0.9)
MONO%: 4.6 % (ref 0.0–14.0)
NEUT%: 77.5 % — AB (ref 39.0–75.0)
NEUTROS ABS: 9.4 10*3/uL — AB (ref 1.5–6.5)
PLATELETS: 333 10*3/uL (ref 140–400)
RBC: 3.38 10*6/uL — ABNORMAL LOW (ref 4.20–5.82)
RDW: 12.6 % (ref 11.0–14.6)
WBC: 12.1 10*3/uL — ABNORMAL HIGH (ref 4.0–10.3)
lymph#: 1.9 10*3/uL (ref 0.9–3.3)

## 2014-04-12 ENCOUNTER — Other Ambulatory Visit: Payer: Federal, State, Local not specified - PPO

## 2014-04-15 ENCOUNTER — Other Ambulatory Visit (HOSPITAL_BASED_OUTPATIENT_CLINIC_OR_DEPARTMENT_OTHER): Payer: Federal, State, Local not specified - PPO

## 2014-04-15 ENCOUNTER — Ambulatory Visit (HOSPITAL_COMMUNITY)
Admission: RE | Admit: 2014-04-15 | Discharge: 2014-04-15 | Disposition: A | Discharge status: Home / Self Care | Payer: Federal, State, Local not specified - PPO | Source: Ambulatory Visit | Attending: Physician Assistant | Admitting: Physician Assistant

## 2014-04-15 DIAGNOSIS — C7952 Secondary malignant neoplasm of bone marrow: Secondary | ICD-10-CM

## 2014-04-15 DIAGNOSIS — C3490 Malignant neoplasm of unspecified part of unspecified bronchus or lung: Secondary | ICD-10-CM | POA: Diagnosis not present

## 2014-04-15 DIAGNOSIS — C7951 Secondary malignant neoplasm of bone: Secondary | ICD-10-CM | POA: Insufficient documentation

## 2014-04-15 DIAGNOSIS — C3430 Malignant neoplasm of lower lobe, unspecified bronchus or lung: Secondary | ICD-10-CM

## 2014-04-15 DIAGNOSIS — I2510 Atherosclerotic heart disease of native coronary artery without angina pectoris: Secondary | ICD-10-CM | POA: Insufficient documentation

## 2014-04-15 DIAGNOSIS — C3492 Malignant neoplasm of unspecified part of left bronchus or lung: Secondary | ICD-10-CM

## 2014-04-15 DIAGNOSIS — K8020 Calculus of gallbladder without cholecystitis without obstruction: Secondary | ICD-10-CM | POA: Insufficient documentation

## 2014-04-15 LAB — CBC WITH DIFFERENTIAL/PLATELET
BASO%: 0.7 % (ref 0.0–2.0)
Basophils Absolute: 0.1 10*3/uL (ref 0.0–0.1)
EOS ABS: 0.2 10*3/uL (ref 0.0–0.5)
EOS%: 1.6 % (ref 0.0–7.0)
HCT: 33.5 % — ABNORMAL LOW (ref 38.4–49.9)
HEMOGLOBIN: 11.4 g/dL — AB (ref 13.0–17.1)
LYMPH%: 24.1 % (ref 14.0–49.0)
MCH: 32.4 pg (ref 27.2–33.4)
MCHC: 33.9 g/dL (ref 32.0–36.0)
MCV: 95.6 fL (ref 79.3–98.0)
MONO#: 0.8 10*3/uL (ref 0.1–0.9)
MONO%: 6.7 % (ref 0.0–14.0)
NEUT%: 66.9 % (ref 39.0–75.0)
NEUTROS ABS: 7.6 10*3/uL — AB (ref 1.5–6.5)
Platelets: 147 10*3/uL (ref 140–400)
RBC: 3.51 10*6/uL — ABNORMAL LOW (ref 4.20–5.82)
RDW: 13.9 % (ref 11.0–14.6)
WBC: 11.3 10*3/uL — ABNORMAL HIGH (ref 4.0–10.3)
lymph#: 2.7 10*3/uL (ref 0.9–3.3)

## 2014-04-15 LAB — COMPREHENSIVE METABOLIC PANEL (CC13)
ALBUMIN: 3.6 g/dL (ref 3.5–5.0)
ALT: 24 U/L (ref 0–55)
ANION GAP: 8 meq/L (ref 3–11)
AST: 22 U/L (ref 5–34)
Alkaline Phosphatase: 130 U/L (ref 40–150)
BUN: 6.5 mg/dL — ABNORMAL LOW (ref 7.0–26.0)
CO2: 31 meq/L — AB (ref 22–29)
Calcium: 9.2 mg/dL (ref 8.4–10.4)
Chloride: 106 mEq/L (ref 98–109)
Creatinine: 0.8 mg/dL (ref 0.7–1.3)
GLUCOSE: 193 mg/dL — AB (ref 70–140)
POTASSIUM: 3.6 meq/L (ref 3.5–5.1)
Sodium: 145 mEq/L (ref 136–145)
TOTAL PROTEIN: 7 g/dL (ref 6.4–8.3)
Total Bilirubin: 0.3 mg/dL (ref 0.20–1.20)

## 2014-04-15 MED ORDER — IOHEXOL 300 MG/ML  SOLN
100.0000 mL | Freq: Once | INTRAMUSCULAR | Status: AC | PRN
  Administered 2014-04-15: 100 mL via INTRAVENOUS

## 2014-04-19 ENCOUNTER — Other Ambulatory Visit: Payer: Self-pay | Admitting: Internal Medicine

## 2014-04-19 ENCOUNTER — Other Ambulatory Visit (HOSPITAL_BASED_OUTPATIENT_CLINIC_OR_DEPARTMENT_OTHER): Payer: Federal, State, Local not specified - PPO

## 2014-04-19 ENCOUNTER — Ambulatory Visit (HOSPITAL_BASED_OUTPATIENT_CLINIC_OR_DEPARTMENT_OTHER): Payer: Federal, State, Local not specified - PPO

## 2014-04-19 VITALS — BP 124/74 | HR 70 | Temp 98.1°F | Resp 18

## 2014-04-19 DIAGNOSIS — C3492 Malignant neoplasm of unspecified part of left bronchus or lung: Secondary | ICD-10-CM

## 2014-04-19 DIAGNOSIS — Z5111 Encounter for antineoplastic chemotherapy: Secondary | ICD-10-CM

## 2014-04-19 DIAGNOSIS — C3430 Malignant neoplasm of lower lobe, unspecified bronchus or lung: Secondary | ICD-10-CM

## 2014-04-19 LAB — CBC WITH DIFFERENTIAL/PLATELET
BASO%: 0.6 % (ref 0.0–2.0)
Basophils Absolute: 0.1 10*3/uL (ref 0.0–0.1)
EOS ABS: 0.1 10*3/uL (ref 0.0–0.5)
EOS%: 1 % (ref 0.0–7.0)
HCT: 34.1 % — ABNORMAL LOW (ref 38.4–49.9)
HGB: 11.6 g/dL — ABNORMAL LOW (ref 13.0–17.1)
LYMPH%: 23 % (ref 14.0–49.0)
MCH: 32.3 pg (ref 27.2–33.4)
MCHC: 33.9 g/dL (ref 32.0–36.0)
MCV: 95.3 fL (ref 79.3–98.0)
MONO#: 0.8 10*3/uL (ref 0.1–0.9)
MONO%: 8.3 % (ref 0.0–14.0)
NEUT%: 67.1 % (ref 39.0–75.0)
NEUTROS ABS: 6.6 10*3/uL — AB (ref 1.5–6.5)
Platelets: 273 10*3/uL (ref 140–400)
RBC: 3.58 10*6/uL — AB (ref 4.20–5.82)
RDW: 14.4 % (ref 11.0–14.6)
WBC: 9.8 10*3/uL (ref 4.0–10.3)
lymph#: 2.2 10*3/uL (ref 0.9–3.3)

## 2014-04-19 LAB — COMPREHENSIVE METABOLIC PANEL (CC13)
ALBUMIN: 3.6 g/dL (ref 3.5–5.0)
ALT: 40 U/L (ref 0–55)
AST: 44 U/L — AB (ref 5–34)
Alkaline Phosphatase: 125 U/L (ref 40–150)
Anion Gap: 9 mEq/L (ref 3–11)
BUN: 9.6 mg/dL (ref 7.0–26.0)
CO2: 28 mEq/L (ref 22–29)
Calcium: 8.9 mg/dL (ref 8.4–10.4)
Chloride: 105 mEq/L (ref 98–109)
Creatinine: 0.7 mg/dL (ref 0.7–1.3)
GLUCOSE: 174 mg/dL — AB (ref 70–140)
POTASSIUM: 3.8 meq/L (ref 3.5–5.1)
SODIUM: 142 meq/L (ref 136–145)
TOTAL PROTEIN: 6.8 g/dL (ref 6.4–8.3)
Total Bilirubin: 0.67 mg/dL (ref 0.20–1.20)

## 2014-04-19 MED ORDER — SODIUM CHLORIDE 0.9 % IV SOLN
750.0000 mg | Freq: Once | INTRAVENOUS | Status: AC
  Administered 2014-04-19: 750 mg via INTRAVENOUS
  Filled 2014-04-19: qty 75

## 2014-04-19 MED ORDER — ONDANSETRON 16 MG/50ML IVPB (CHCC)
16.0000 mg | Freq: Once | INTRAVENOUS | Status: AC
  Administered 2014-04-19: 16 mg via INTRAVENOUS

## 2014-04-19 MED ORDER — ONDANSETRON 16 MG/50ML IVPB (CHCC)
INTRAVENOUS | Status: AC
  Filled 2014-04-19: qty 16

## 2014-04-19 MED ORDER — DEXAMETHASONE SODIUM PHOSPHATE 20 MG/5ML IJ SOLN
INTRAMUSCULAR | Status: AC
  Filled 2014-04-19: qty 5

## 2014-04-19 MED ORDER — SODIUM CHLORIDE 0.9 % IV SOLN
Freq: Once | INTRAVENOUS | Status: AC
  Administered 2014-04-19: 14:00:00 via INTRAVENOUS

## 2014-04-19 MED ORDER — ETOPOSIDE CHEMO INJECTION 1 GM/50ML
120.0000 mg/m2 | Freq: Once | INTRAVENOUS | Status: AC
  Administered 2014-04-19: 280 mg via INTRAVENOUS
  Filled 2014-04-19: qty 14

## 2014-04-19 MED ORDER — DEXAMETHASONE SODIUM PHOSPHATE 20 MG/5ML IJ SOLN
20.0000 mg | Freq: Once | INTRAMUSCULAR | Status: AC
  Administered 2014-04-19: 20 mg via INTRAVENOUS

## 2014-04-19 NOTE — Patient Instructions (Signed)
[facility] Discharge Instructions for Patients Receiving Chemotherapy  Today you received the following chemotherapy agents Carboplatin/Etoposide.   To help prevent nausea and vomiting after your treatment, we encourage you to take your nausea medication as directed.    If you develop nausea and vomiting that is not controlled by your nausea medication, call the clinic.   BELOW ARE SYMPTOMS THAT SHOULD BE REPORTED IMMEDIATELY:  *FEVER GREATER THAN 100.5 F  *CHILLS WITH OR WITHOUT FEVER  NAUSEA AND VOMITING THAT IS NOT CONTROLLED WITH YOUR NAUSEA MEDICATION  *UNUSUAL SHORTNESS OF BREATH  *UNUSUAL BRUISING OR BLEEDING  TENDERNESS IN MOUTH AND THROAT WITH OR WITHOUT PRESENCE OF ULCERS  *URINARY PROBLEMS  *BOWEL PROBLEMS  UNUSUAL RASH Items with * indicate a potential emergency and should be followed up as soon as possible.  Feel free to call the clinic you have any questions or concerns. The clinic phone number is [phone].

## 2014-04-20 ENCOUNTER — Ambulatory Visit (HOSPITAL_BASED_OUTPATIENT_CLINIC_OR_DEPARTMENT_OTHER): Payer: Federal, State, Local not specified - PPO

## 2014-04-20 VITALS — BP 130/67 | HR 71 | Temp 98.1°F

## 2014-04-20 DIAGNOSIS — C3430 Malignant neoplasm of lower lobe, unspecified bronchus or lung: Secondary | ICD-10-CM

## 2014-04-20 DIAGNOSIS — C3492 Malignant neoplasm of unspecified part of left bronchus or lung: Secondary | ICD-10-CM

## 2014-04-20 DIAGNOSIS — Z5111 Encounter for antineoplastic chemotherapy: Secondary | ICD-10-CM

## 2014-04-20 MED ORDER — DEXAMETHASONE SODIUM PHOSPHATE 10 MG/ML IJ SOLN
INTRAMUSCULAR | Status: AC
  Filled 2014-04-20: qty 1

## 2014-04-20 MED ORDER — SODIUM CHLORIDE 0.9 % IV SOLN
Freq: Once | INTRAVENOUS | Status: AC
  Administered 2014-04-20: 15:00:00 via INTRAVENOUS

## 2014-04-20 MED ORDER — ONDANSETRON 8 MG/NS 50 ML IVPB
INTRAVENOUS | Status: AC
Start: 2014-04-20 — End: 2014-04-20
  Filled 2014-04-20: qty 8

## 2014-04-20 MED ORDER — ONDANSETRON 8 MG/50ML IVPB (CHCC)
8.0000 mg | Freq: Once | INTRAVENOUS | Status: AC
  Administered 2014-04-20: 8 mg via INTRAVENOUS

## 2014-04-20 MED ORDER — SODIUM CHLORIDE 0.9 % IV SOLN
120.0000 mg/m2 | Freq: Once | INTRAVENOUS | Status: AC
  Administered 2014-04-20: 280 mg via INTRAVENOUS
  Filled 2014-04-20: qty 14

## 2014-04-20 MED ORDER — DEXAMETHASONE SODIUM PHOSPHATE 10 MG/ML IJ SOLN
10.0000 mg | Freq: Once | INTRAMUSCULAR | Status: AC
  Administered 2014-04-20: 10 mg via INTRAVENOUS

## 2014-04-20 NOTE — Patient Instructions (Signed)
[facility] Discharge Instructions for Patients Receiving Chemotherapy  Today you received the following chemotherapy agents: Etoposide. To help prevent nausea and vomiting after your treatment, we encourage you to take your nausea medication: Compazine 10mg   Every 6 hours as needed.   If you develop nausea and vomiting that is not controlled by your nausea medication, call the clinic.   BELOW ARE SYMPTOMS THAT SHOULD BE REPORTED IMMEDIATELY:  *FEVER GREATER THAN 100.5 F  *CHILLS WITH OR WITHOUT FEVER  NAUSEA AND VOMITING THAT IS NOT CONTROLLED WITH YOUR NAUSEA MEDICATION  *UNUSUAL SHORTNESS OF BREATH  *UNUSUAL BRUISING OR BLEEDING  TENDERNESS IN MOUTH AND THROAT WITH OR WITHOUT PRESENCE OF ULCERS  *URINARY PROBLEMS  *BOWEL PROBLEMS  UNUSUAL RASH Items with * indicate a potential emergency and should be followed up as soon as possible.  Feel free to call the clinic you have any questions or concerns. The clinic phone number is [phone].

## 2014-04-21 ENCOUNTER — Telehealth: Payer: Self-pay | Admitting: Internal Medicine

## 2014-04-21 ENCOUNTER — Ambulatory Visit (HOSPITAL_BASED_OUTPATIENT_CLINIC_OR_DEPARTMENT_OTHER): Payer: Federal, State, Local not specified - PPO

## 2014-04-21 ENCOUNTER — Encounter: Payer: Self-pay | Admitting: Internal Medicine

## 2014-04-21 ENCOUNTER — Ambulatory Visit (HOSPITAL_BASED_OUTPATIENT_CLINIC_OR_DEPARTMENT_OTHER): Payer: Federal, State, Local not specified - PPO | Admitting: Internal Medicine

## 2014-04-21 VITALS — BP 127/70 | HR 75 | Temp 97.9°F | Resp 19 | Ht 74.0 in | Wt 238.7 lb

## 2014-04-21 DIAGNOSIS — C3430 Malignant neoplasm of lower lobe, unspecified bronchus or lung: Secondary | ICD-10-CM

## 2014-04-21 DIAGNOSIS — C787 Secondary malignant neoplasm of liver and intrahepatic bile duct: Secondary | ICD-10-CM

## 2014-04-21 DIAGNOSIS — Z5111 Encounter for antineoplastic chemotherapy: Secondary | ICD-10-CM

## 2014-04-21 DIAGNOSIS — C7952 Secondary malignant neoplasm of bone marrow: Secondary | ICD-10-CM

## 2014-04-21 DIAGNOSIS — R0609 Other forms of dyspnea: Secondary | ICD-10-CM

## 2014-04-21 DIAGNOSIS — C7951 Secondary malignant neoplasm of bone: Secondary | ICD-10-CM

## 2014-04-21 DIAGNOSIS — C3490 Malignant neoplasm of unspecified part of unspecified bronchus or lung: Secondary | ICD-10-CM

## 2014-04-21 DIAGNOSIS — R0989 Other specified symptoms and signs involving the circulatory and respiratory systems: Secondary | ICD-10-CM

## 2014-04-21 DIAGNOSIS — C3492 Malignant neoplasm of unspecified part of left bronchus or lung: Secondary | ICD-10-CM

## 2014-04-21 MED ORDER — ETOPOSIDE CHEMO INJECTION 1 GM/50ML
120.0000 mg/m2 | Freq: Once | INTRAVENOUS | Status: AC
  Administered 2014-04-21: 280 mg via INTRAVENOUS
  Filled 2014-04-21: qty 14

## 2014-04-21 MED ORDER — DEXAMETHASONE SODIUM PHOSPHATE 10 MG/ML IJ SOLN
INTRAMUSCULAR | Status: AC
  Filled 2014-04-21: qty 1

## 2014-04-21 MED ORDER — SODIUM CHLORIDE 0.9 % IV SOLN: Freq: Once | INTRAVENOUS | Status: DC

## 2014-04-21 MED ORDER — ONDANSETRON 8 MG/50ML IVPB (CHCC)
8.0000 mg | Freq: Once | INTRAVENOUS | Status: AC
  Administered 2014-04-21: 8 mg via INTRAVENOUS

## 2014-04-21 MED ORDER — DEXAMETHASONE SODIUM PHOSPHATE 10 MG/ML IJ SOLN
10.0000 mg | Freq: Once | INTRAMUSCULAR | Status: AC
  Administered 2014-04-21: 10 mg via INTRAVENOUS

## 2014-04-21 MED ORDER — ONDANSETRON 8 MG/NS 50 ML IVPB
INTRAVENOUS | Status: AC
  Filled 2014-04-21: qty 8

## 2014-04-21 NOTE — Patient Instructions (Signed)
[facility] Discharge Instructions for Patients Receiving Chemotherapy  Today you received the following chemotherapy agents Etoposide  To help prevent nausea and vomiting after your treatment, we encourage you to take your nausea medication  Compazine as needed If you develop nausea and vomiting that is not controlled by your nausea medication, call the clinic.   BELOW ARE SYMPTOMS THAT SHOULD BE REPORTED IMMEDIATELY:  *FEVER GREATER THAN 100.5 F  *CHILLS WITH OR WITHOUT FEVER  NAUSEA AND VOMITING THAT IS NOT CONTROLLED WITH YOUR NAUSEA MEDICATION  *UNUSUAL SHORTNESS OF BREATH  *UNUSUAL BRUISING OR BLEEDING  TENDERNESS IN MOUTH AND THROAT WITH OR WITHOUT PRESENCE OF ULCERS  *URINARY PROBLEMS  *BOWEL PROBLEMS  UNUSUAL RASH Items with * indicate a potential emergency and should be followed up as soon as possible.  Feel free to call the clinic you have any questions or concerns. The clinic phone number is [phone].

## 2014-04-21 NOTE — Progress Notes (Signed)
[facility] [phone]   Fax:([phone] [place], [address] [place] [address]  DIAGNOSIS: extensive stage, Stage IV (T2a, N1, M1b) small cell lung cancer diagnosed in July of 2015.  PRIOR THERAPY: None  CURRENT THERAPY: systemic chemotherapy with carboplatin for AUC of 5 on day 1 and etoposide at 120 mg/M2 on days 1, 2 and 3 with Neulasta support on day 4. Status post 2 cycles.   INTERVAL HISTORY: Kerry Peck 53 y.o. male returns to the clinic today for follow up visit. The patient tolerated the second cycle of his treatment fairly well with no significant adverse effects. He denied having any significant fever or chills, no nausea or vomiting. He denied having any significant weight loss or night sweats. The patient has no significant chest pain but continues to have shortness of breath with exertion with no cough or hemoptysis. He had repeat CT scan of the chest, abdomen and pelvis performed recently and he is here for evaluation and discussion of his scan results.  MEDICAL HISTORY: Past Medical History  Diagnosis Date  . Diabetes mellitus without complication   . Hypertension   . Smoking addiction   . Cancer     lung and liver    ALLERGIES:  has No Known Allergies.  MEDICATIONS:  Current Outpatient Prescriptions  Medication Sig Dispense Refill  . albuterol (PROVENTIL HFA;VENTOLIN HFA) 108 (90 BASE) MCG/ACT inhaler Inhale 2 puffs into the lungs every 6 (six) hours as needed for wheezing.  1 Inhaler  0  . amLODipine (NORVASC) 10 MG tablet Take 10 mg by mouth daily.      Marland Kitchen aspirin EC 81 MG tablet Take 81 mg by mouth daily.      Marland Kitchen atorvastatin (LIPITOR) 40 MG tablet Take 40 mg by mouth daily.      . calcium-vitamin D (OSCAL WITH D) 500-200 MG-UNIT per tablet Take 1 tablet by mouth.      . fenofibrate 160 MG tablet Take 160 mg by mouth daily.      . Fluticasone-Salmeterol (ADVAIR)  250-50 MCG/DOSE AEPB Inhale 1 puff into the lungs every 12 (twelve) hours.      . metFORMIN (GLUCOPHAGE) 500 MG tablet Take 500 mg by mouth daily with breakfast.       . metoprolol tartrate (LOPRESSOR) 25 MG tablet Take 25 mg by mouth daily.       . Multiple Vitamins-Minerals (CENTRUM SILVER ADULT 50+ PO) Take by mouth.      . pyridOXINE (VITAMIN B-6) 100 MG tablet Take 100 mg by mouth daily.      . vitamin C (ASCORBIC ACID) 500 MG tablet Take 500 mg by mouth daily.      Marland Kitchen HYDROcodone-acetaminophen (NORCO/VICODIN) 5-325 MG per tablet Take 1 tablet by mouth every 6 (six) hours as needed for moderate pain.  15 tablet  0  . prochlorperazine (COMPAZINE) 10 MG tablet Take 1 tablet (10 mg total) by mouth every 6 (six) hours as needed for nausea or vomiting.  30 tablet  0   No current facility-administered medications for this visit.    REVIEW OF SYSTEMS:  Constitutional: positive for fatigue Eyes: negative Ears, nose, mouth, throat, and face: negative Respiratory: positive for dyspnea on exertion Cardiovascular: negative Gastrointestinal: negative Genitourinary:negative Integument/breast: negative Hematologic/lymphatic: negative Musculoskeletal:negative Neurological: negative Behavioral/Psych: negative Endocrine: negative Allergic/Immunologic: negative   PHYSICAL EXAMINATION: General appearance: alert, cooperative and no distress Head: Normocephalic, without  obvious abnormality, atraumatic Neck: no adenopathy, no JVD, supple, symmetrical, trachea midline and thyroid not enlarged, symmetric, no tenderness/mass/nodules Lymph nodes: Cervical, supraclavicular, and axillary nodes normal. Resp: clear to auscultation bilaterally Back: symmetric, no curvature. ROM normal. No CVA tenderness. Cardio: regular rate and rhythm, S1, S2 normal, no murmur, click, rub or gallop GI: soft, non-tender; bowel sounds normal; no masses,  no organomegaly Extremities: extremities normal, atraumatic, no cyanosis  or edema Neurologic: Alert and oriented X 3, normal strength and tone. Normal symmetric reflexes. Normal coordination and gait  ECOG PERFORMANCE STATUS: 1 - Symptomatic but completely ambulatory  Blood pressure 127/70, pulse 75, temperature 97.9 F (36.6 C), temperature source Oral, resp. rate 19, height 6\' 2"  (1.88 m), weight 238 lb 11.2 oz (108.274 kg).  LABORATORY DATA: Lab Results  Component Value Date   WBC 9.8 04/19/2014   HGB 11.6* 04/19/2014   HCT 34.1* 04/19/2014   MCV 95.3 04/19/2014   PLT 273 04/19/2014      Chemistry      Component Value Date/Time   NA 142 04/19/2014 1304   NA 136 04/05/2014 1338   K 3.8 04/19/2014 1304   K 3.9 04/05/2014 1338   CL 101 04/05/2014 1338   CO2 28 04/19/2014 1304   CO2 27 04/05/2014 1338   BUN 9.6 04/19/2014 1304   BUN 10 04/05/2014 1338   CREATININE 0.7 04/19/2014 1304   CREATININE 0.64 04/05/2014 1338      Component Value Date/Time   CALCIUM 8.9 04/19/2014 1304   CALCIUM 8.9 04/05/2014 1338   ALKPHOS 125 04/19/2014 1304   ALKPHOS 139* 04/05/2014 1338   AST 44* 04/19/2014 1304   AST 13 04/05/2014 1338   ALT 40 04/19/2014 1304   ALT 17 04/05/2014 1338   BILITOT 0.67 04/19/2014 1304   BILITOT 0.5 04/05/2014 1338       RADIOGRAPHIC STUDIES: Ct Chest W Contrast  04/15/2014   CLINICAL DATA:  Small cell lung cancer, restaging.  EXAM: CT CHEST, ABDOMEN, AND PELVIS WITH CONTRAST  TECHNIQUE: Multidetector CT imaging of the chest, abdomen and pelvis was performed following the standard protocol during bolus administration of intravenous contrast.  CONTRAST:  139mL OMNIPAQUE IOHEXOL 300 MG/ML  SOLN  COMPARISON:  PET 02/10/2014, CT chest 01/11/2014 and CT abdomen pelvis 01/30/2007.  FINDINGS: CT CHEST FINDINGS  No pathologically enlarged mediastinal, hilar or axillary lymph nodes. Left hilar lymph node measures 6 mm in short axis (previously 1.2 cm on 01/11/2014). Three-vessel coronary artery calcification. Heart size normal. No pericardial effusion.  Left lower  lobe nodule measures 2.2 x 2.7 cm (previously 2.8 x 3.5 cm). Lungs are otherwise clear. No pleural fluid. Airway is unremarkable.  CT ABDOMEN AND PELVIS FINDINGS  Liver is unremarkable. A small stone is seen in the gallbladder. Liver, gallbladder, adrenal glands, kidneys, spleen, pancreas, stomach and small bowel are unremarkable. Stool is seen in the majority of the colon, indicative of constipation. Appendix is normal.  Prostate is minimally enlarged. Bladder is unremarkable. Atherosclerotic calcification of the arterial vasculature without abdominal aortic aneurysm. Hepatic duodenal lymph node measures 1.6 cm, stable. No free fluid.  Lucent lesions are seen in the spine, measuring up to 2.6 cm in the T7 vertebral body, previously 2.1 cm on 02/10/2014. Lucent lesion in the T6 vertebral body is seen with surrounding sclerosis and is new (series 2, image 24). Mild compression of the T7 vertebral body. Additional compression deformities involving T12, L1 and L3 are grossly stable.  IMPRESSION: 1. Progressive osseous metastatic disease  with mild pathologic compression of the T7 vertebral body. 2. Left lower lobe nodule has decreased in size. 3. Hepatic metastasis seen on 02/10/2014 is not readily identified. Hepatic duodenal lymph node is stable. 4. Three-vessel coronary artery calcification. 5. Cholelithiasis. 6. Mild prostate enlargement.   Electronically Signed   By: Lorin Picket M.D.   On: 04/15/2014 14:50   Ct Abdomen Pelvis W Contrast  04/15/2014   CLINICAL DATA:  Small cell lung cancer, restaging.  EXAM: CT CHEST, ABDOMEN, AND PELVIS WITH CONTRAST  TECHNIQUE: Multidetector CT imaging of the chest, abdomen and pelvis was performed following the standard protocol during bolus administration of intravenous contrast.  CONTRAST:  167mL OMNIPAQUE IOHEXOL 300 MG/ML  SOLN  COMPARISON:  PET 02/10/2014, CT chest 01/11/2014 and CT abdomen pelvis 01/30/2007.  FINDINGS: CT CHEST FINDINGS  No pathologically enlarged  mediastinal, hilar or axillary lymph nodes. Left hilar lymph node measures 6 mm in short axis (previously 1.2 cm on 01/11/2014). Three-vessel coronary artery calcification. Heart size normal. No pericardial effusion.  Left lower lobe nodule measures 2.2 x 2.7 cm (previously 2.8 x 3.5 cm). Lungs are otherwise clear. No pleural fluid. Airway is unremarkable.  CT ABDOMEN AND PELVIS FINDINGS  Liver is unremarkable. A small stone is seen in the gallbladder. Liver, gallbladder, adrenal glands, kidneys, spleen, pancreas, stomach and small bowel are unremarkable. Stool is seen in the majority of the colon, indicative of constipation. Appendix is normal.  Prostate is minimally enlarged. Bladder is unremarkable. Atherosclerotic calcification of the arterial vasculature without abdominal aortic aneurysm. Hepatic duodenal lymph node measures 1.6 cm, stable. No free fluid.  Lucent lesions are seen in the spine, measuring up to 2.6 cm in the T7 vertebral body, previously 2.1 cm on 02/10/2014. Lucent lesion in the T6 vertebral body is seen with surrounding sclerosis and is new (series 2, image 24). Mild compression of the T7 vertebral body. Additional compression deformities involving T12, L1 and L3 are grossly stable.  IMPRESSION: 1. Progressive osseous metastatic disease with mild pathologic compression of the T7 vertebral body. 2. Left lower lobe nodule has decreased in size. 3. Hepatic metastasis seen on 02/10/2014 is not readily identified. Hepatic duodenal lymph node is stable. 4. Three-vessel coronary artery calcification. 5. Cholelithiasis. 6. Mild prostate enlargement.   Electronically Signed   By: Lorin Picket M.D.   On: 04/15/2014 14:50    ASSESSMENT AND PLAN: this is a very pleasant 53 years old white male recently diagnosed with extensive stage small cell lung cancer status post 2 cycles of systemic chemotherapy with carboplatin and etoposide and he is currently undergoing cycle #3. The patient tolerated the  first 2 cycles of his treatment fairly well with no significant adverse effects. He has significant improvement in his disease after the first 2 cycles of his chemotherapy. I discussed the scan results with the patient today. I recommended for him to continue with the same regimen of chemotherapy with carboplatin and etoposide. He would come back for follow up visit in 3 weeks with the start of cycle #4. The patient was advised to call immediately if he has any concerning symptoms in the interval.  The patient voices understanding of current disease status and treatment options and is in agreement with the current care plan.  All questions were answered. The patient knows to call the clinic with any problems, questions or concerns. We can certainly see the patient much sooner if necessary.  Disclaimer: This note was dictated with voice recognition software. Similar sounding words  can inadvertently be transcribed and may not be corrected upon review.

## 2014-04-21 NOTE — Telephone Encounter (Signed)
gv adn printed appt sched and avs for pt fro Sept...sed added tx.

## 2014-04-22 ENCOUNTER — Ambulatory Visit (HOSPITAL_BASED_OUTPATIENT_CLINIC_OR_DEPARTMENT_OTHER): Payer: Federal, State, Local not specified - PPO

## 2014-04-22 VITALS — BP 131/69 | HR 71 | Temp 98.1°F

## 2014-04-22 DIAGNOSIS — C3430 Malignant neoplasm of lower lobe, unspecified bronchus or lung: Secondary | ICD-10-CM

## 2014-04-22 DIAGNOSIS — C3492 Malignant neoplasm of unspecified part of left bronchus or lung: Secondary | ICD-10-CM

## 2014-04-22 DIAGNOSIS — Z5189 Encounter for other specified aftercare: Secondary | ICD-10-CM

## 2014-04-22 DIAGNOSIS — C7951 Secondary malignant neoplasm of bone: Secondary | ICD-10-CM

## 2014-04-22 DIAGNOSIS — C787 Secondary malignant neoplasm of liver and intrahepatic bile duct: Secondary | ICD-10-CM

## 2014-04-22 DIAGNOSIS — C7952 Secondary malignant neoplasm of bone marrow: Secondary | ICD-10-CM

## 2014-04-22 MED ORDER — PEGFILGRASTIM INJECTION 6 MG/0.6ML
6.0000 mg | Freq: Once | SUBCUTANEOUS | Status: AC
  Administered 2014-04-22: 6 mg via SUBCUTANEOUS
  Filled 2014-04-22: qty 0.6

## 2014-04-28 ENCOUNTER — Other Ambulatory Visit (HOSPITAL_BASED_OUTPATIENT_CLINIC_OR_DEPARTMENT_OTHER): Payer: Federal, State, Local not specified - PPO

## 2014-04-28 DIAGNOSIS — C3430 Malignant neoplasm of lower lobe, unspecified bronchus or lung: Secondary | ICD-10-CM

## 2014-04-28 DIAGNOSIS — C3492 Malignant neoplasm of unspecified part of left bronchus or lung: Secondary | ICD-10-CM

## 2014-04-28 LAB — COMPREHENSIVE METABOLIC PANEL (CC13)
ALT: 37 U/L (ref 0–55)
AST: 25 U/L (ref 5–34)
Albumin: 3.6 g/dL (ref 3.5–5.0)
Alkaline Phosphatase: 136 U/L (ref 40–150)
Anion Gap: 10 mEq/L (ref 3–11)
BUN: 9.4 mg/dL (ref 7.0–26.0)
CO2: 27 mEq/L (ref 22–29)
Calcium: 8.2 mg/dL — ABNORMAL LOW (ref 8.4–10.4)
Chloride: 104 mEq/L (ref 98–109)
Creatinine: 0.7 mg/dL (ref 0.7–1.3)
Glucose: 188 mg/dl — ABNORMAL HIGH (ref 70–140)
Potassium: 3.6 mEq/L (ref 3.5–5.1)
SODIUM: 141 meq/L (ref 136–145)
TOTAL PROTEIN: 6.5 g/dL (ref 6.4–8.3)
Total Bilirubin: 0.53 mg/dL (ref 0.20–1.20)

## 2014-04-28 LAB — CBC WITH DIFFERENTIAL/PLATELET
BASO%: 0.8 % (ref 0.0–2.0)
Basophils Absolute: 0 10*3/uL (ref 0.0–0.1)
EOS%: 1.5 % (ref 0.0–7.0)
Eosinophils Absolute: 0.1 10*3/uL (ref 0.0–0.5)
HCT: 26.9 % — ABNORMAL LOW (ref 38.4–49.9)
HGB: 9 g/dL — ABNORMAL LOW (ref 13.0–17.1)
LYMPH%: 29.5 % (ref 14.0–49.0)
MCH: 32.4 pg (ref 27.2–33.4)
MCHC: 33.6 g/dL (ref 32.0–36.0)
MCV: 96.6 fL (ref 79.3–98.0)
MONO#: 0.5 10*3/uL (ref 0.1–0.9)
MONO%: 8.9 % (ref 0.0–14.0)
NEUT#: 3.3 10*3/uL (ref 1.5–6.5)
NEUT%: 59.3 % (ref 39.0–75.0)
Platelets: 105 10*3/uL — ABNORMAL LOW (ref 140–400)
RBC: 2.79 10*6/uL — AB (ref 4.20–5.82)
RDW: 15.8 % — ABNORMAL HIGH (ref 11.0–14.6)
WBC: 5.6 10*3/uL (ref 4.0–10.3)
lymph#: 1.6 10*3/uL (ref 0.9–3.3)

## 2014-05-05 ENCOUNTER — Other Ambulatory Visit (HOSPITAL_BASED_OUTPATIENT_CLINIC_OR_DEPARTMENT_OTHER): Payer: Federal, State, Local not specified - PPO

## 2014-05-05 ENCOUNTER — Telehealth: Payer: Self-pay | Admitting: Internal Medicine

## 2014-05-05 DIAGNOSIS — C3430 Malignant neoplasm of lower lobe, unspecified bronchus or lung: Secondary | ICD-10-CM

## 2014-05-05 DIAGNOSIS — C3492 Malignant neoplasm of unspecified part of left bronchus or lung: Secondary | ICD-10-CM

## 2014-05-05 LAB — CBC WITH DIFFERENTIAL/PLATELET
BASO%: 0.6 % (ref 0.0–2.0)
BASOS ABS: 0 10*3/uL (ref 0.0–0.1)
EOS%: 1.5 % (ref 0.0–7.0)
Eosinophils Absolute: 0.1 10*3/uL (ref 0.0–0.5)
HCT: 29.9 % — ABNORMAL LOW (ref 38.4–49.9)
HEMOGLOBIN: 10 g/dL — AB (ref 13.0–17.1)
LYMPH%: 29.8 % (ref 14.0–49.0)
MCH: 33.7 pg — ABNORMAL HIGH (ref 27.2–33.4)
MCHC: 33.4 g/dL (ref 32.0–36.0)
MCV: 101 fL — ABNORMAL HIGH (ref 79.3–98.0)
MONO#: 0.7 10*3/uL (ref 0.1–0.9)
MONO%: 9.3 % (ref 0.0–14.0)
NEUT#: 4.1 10*3/uL (ref 1.5–6.5)
NEUT%: 58.8 % (ref 39.0–75.0)
PLATELETS: 174 10*3/uL (ref 140–400)
RBC: 2.96 10*6/uL — ABNORMAL LOW (ref 4.20–5.82)
RDW: 18.9 % — ABNORMAL HIGH (ref 11.0–14.6)
WBC: 7 10*3/uL (ref 4.0–10.3)
lymph#: 2.1 10*3/uL (ref 0.9–3.3)

## 2014-05-05 LAB — COMPREHENSIVE METABOLIC PANEL (CC13)
ALT: 31 U/L (ref 0–55)
ANION GAP: 12 meq/L — AB (ref 3–11)
AST: 27 U/L (ref 5–34)
Albumin: 3.5 g/dL (ref 3.5–5.0)
Alkaline Phosphatase: 117 U/L (ref 40–150)
BILIRUBIN TOTAL: 0.36 mg/dL (ref 0.20–1.20)
BUN: 4.7 mg/dL — AB (ref 7.0–26.0)
CO2: 26 meq/L (ref 22–29)
CREATININE: 0.7 mg/dL (ref 0.7–1.3)
Calcium: 8.6 mg/dL (ref 8.4–10.4)
Chloride: 107 mEq/L (ref 98–109)
Glucose: 206 mg/dl — ABNORMAL HIGH (ref 70–140)
Potassium: 3.5 mEq/L (ref 3.5–5.1)
Sodium: 145 mEq/L (ref 136–145)
Total Protein: 6.5 g/dL (ref 6.4–8.3)

## 2014-05-05 NOTE — Telephone Encounter (Signed)
lvm for pt regarding appt changes....advised pt to pick up new sched

## 2014-05-06 ENCOUNTER — Telehealth: Payer: Self-pay | Admitting: Nurse Practitioner

## 2014-05-06 NOTE — Telephone Encounter (Signed)
Pt cld stated he wanted someone to mail his updated sch. I will mail out today, Shameeka lft msg yesterday with updated sch...>KJ

## 2014-05-10 ENCOUNTER — Telehealth: Payer: Self-pay | Admitting: Internal Medicine

## 2014-05-10 ENCOUNTER — Other Ambulatory Visit (HOSPITAL_BASED_OUTPATIENT_CLINIC_OR_DEPARTMENT_OTHER): Payer: Federal, State, Local not specified - PPO

## 2014-05-10 ENCOUNTER — Encounter: Payer: Self-pay | Admitting: Nurse Practitioner

## 2014-05-10 ENCOUNTER — Ambulatory Visit (HOSPITAL_BASED_OUTPATIENT_CLINIC_OR_DEPARTMENT_OTHER): Payer: Federal, State, Local not specified - PPO | Admitting: Nurse Practitioner

## 2014-05-10 ENCOUNTER — Ambulatory Visit (HOSPITAL_BASED_OUTPATIENT_CLINIC_OR_DEPARTMENT_OTHER): Payer: Federal, State, Local not specified - PPO

## 2014-05-10 VITALS — BP 140/75 | HR 70 | Temp 97.9°F | Resp 19 | Ht 74.0 in | Wt 234.7 lb

## 2014-05-10 DIAGNOSIS — R600 Localized edema: Secondary | ICD-10-CM | POA: Insufficient documentation

## 2014-05-10 DIAGNOSIS — K1231 Oral mucositis (ulcerative) due to antineoplastic therapy: Secondary | ICD-10-CM

## 2014-05-10 DIAGNOSIS — C3430 Malignant neoplasm of lower lobe, unspecified bronchus or lung: Secondary | ICD-10-CM

## 2014-05-10 DIAGNOSIS — C3492 Malignant neoplasm of unspecified part of left bronchus or lung: Secondary | ICD-10-CM

## 2014-05-10 DIAGNOSIS — C7951 Secondary malignant neoplasm of bone: Secondary | ICD-10-CM

## 2014-05-10 DIAGNOSIS — C3490 Malignant neoplasm of unspecified part of unspecified bronchus or lung: Secondary | ICD-10-CM

## 2014-05-10 DIAGNOSIS — R609 Edema, unspecified: Secondary | ICD-10-CM | POA: Insufficient documentation

## 2014-05-10 DIAGNOSIS — C787 Secondary malignant neoplasm of liver and intrahepatic bile duct: Secondary | ICD-10-CM

## 2014-05-10 DIAGNOSIS — C7952 Secondary malignant neoplasm of bone marrow: Secondary | ICD-10-CM

## 2014-05-10 DIAGNOSIS — Z5111 Encounter for antineoplastic chemotherapy: Secondary | ICD-10-CM

## 2014-05-10 LAB — CBC WITH DIFFERENTIAL/PLATELET
BASO%: 0.6 % (ref 0.0–2.0)
BASOS ABS: 0 10*3/uL (ref 0.0–0.1)
EOS ABS: 0.1 10*3/uL (ref 0.0–0.5)
EOS%: 1.2 % (ref 0.0–7.0)
HEMATOCRIT: 32.4 % — AB (ref 38.4–49.9)
HEMOGLOBIN: 10.9 g/dL — AB (ref 13.0–17.1)
LYMPH%: 28.3 % (ref 14.0–49.0)
MCH: 34.2 pg — AB (ref 27.2–33.4)
MCHC: 33.5 g/dL (ref 32.0–36.0)
MCV: 102.3 fL — ABNORMAL HIGH (ref 79.3–98.0)
MONO#: 0.8 10*3/uL (ref 0.1–0.9)
MONO%: 9.5 % (ref 0.0–14.0)
NEUT%: 60.4 % (ref 39.0–75.0)
NEUTROS ABS: 4.9 10*3/uL (ref 1.5–6.5)
Platelets: 278 10*3/uL (ref 140–400)
RBC: 3.17 10*6/uL — ABNORMAL LOW (ref 4.20–5.82)
RDW: 20.2 % — AB (ref 11.0–14.6)
WBC: 8.1 10*3/uL (ref 4.0–10.3)
lymph#: 2.3 10*3/uL (ref 0.9–3.3)

## 2014-05-10 LAB — COMPREHENSIVE METABOLIC PANEL (CC13)
ALT: 25 U/L (ref 0–55)
ANION GAP: 10 meq/L (ref 3–11)
AST: 26 U/L (ref 5–34)
Albumin: 3.6 g/dL (ref 3.5–5.0)
Alkaline Phosphatase: 110 U/L (ref 40–150)
BUN: 7.1 mg/dL (ref 7.0–26.0)
CALCIUM: 8.9 mg/dL (ref 8.4–10.4)
CO2: 25 meq/L (ref 22–29)
CREATININE: 0.7 mg/dL (ref 0.7–1.3)
Chloride: 108 mEq/L (ref 98–109)
GLUCOSE: 137 mg/dL (ref 70–140)
Potassium: 4 mEq/L (ref 3.5–5.1)
Sodium: 144 mEq/L (ref 136–145)
Total Bilirubin: 0.51 mg/dL (ref 0.20–1.20)
Total Protein: 6.7 g/dL (ref 6.4–8.3)

## 2014-05-10 MED ORDER — DEXAMETHASONE SODIUM PHOSPHATE 20 MG/5ML IJ SOLN
20.0000 mg | Freq: Once | INTRAMUSCULAR | Status: AC
  Administered 2014-05-10: 20 mg via INTRAVENOUS

## 2014-05-10 MED ORDER — ONDANSETRON 16 MG/50ML IVPB (CHCC)
16.0000 mg | Freq: Once | INTRAVENOUS | Status: AC
  Administered 2014-05-10: 16 mg via INTRAVENOUS

## 2014-05-10 MED ORDER — DEXAMETHASONE SODIUM PHOSPHATE 20 MG/5ML IJ SOLN
INTRAMUSCULAR | Status: AC
  Filled 2014-05-10: qty 5

## 2014-05-10 MED ORDER — CARBOPLATIN CHEMO INJECTION 600 MG/60ML
750.0000 mg | Freq: Once | INTRAVENOUS | Status: AC
  Administered 2014-05-10: 750 mg via INTRAVENOUS
  Filled 2014-05-10: qty 75

## 2014-05-10 MED ORDER — SODIUM CHLORIDE 0.9 % IV SOLN
120.0000 mg/m2 | Freq: Once | INTRAVENOUS | Status: AC
  Administered 2014-05-10: 280 mg via INTRAVENOUS
  Filled 2014-05-10: qty 14

## 2014-05-10 MED ORDER — SODIUM CHLORIDE 0.9 % IV SOLN
Freq: Once | INTRAVENOUS | Status: AC
  Administered 2014-05-10: 15:00:00 via INTRAVENOUS

## 2014-05-10 MED ORDER — ONDANSETRON 16 MG/50ML IVPB (CHCC)
INTRAVENOUS | Status: AC
  Filled 2014-05-10: qty 16

## 2014-05-10 NOTE — Progress Notes (Signed)
[facility]   Chief Complaint  Patient presents with  . Follow-up    HPI: Kerry Peck 53 y.o. male diagnosed with lung cancer.  Currently undergoing carboplatin/etoposide chemotherapy regimen.    Patient presents today for cycle 4 of his chemotherapy.  He states he's been doing fairly well recently.  He continues to work on a full-time basis the [employer].  He denies issues with either fatigue or appetite at present time.  He denies any recent fevers or chills.  He complains of some very mild mucositis; with some tongue sensitivity only.  He is also noticed some trace edema to his bilateral lower extremities recently.  He states that this does resolve when he sleeps and gets morning.  He denies any specific pain whatsoever.  He denies any other new symptoms.  HPI  CURRENT THERAPY: Upcoming Treatment Dates - LUNG SMALL CELL Carboplatin D1 / Etoposide D1-3 q21d Days with orders from any treatment category:  05/11/2014      SCHEDULING COMMUNICATION      ondansetron (ZOFRAN) IVPB 8 mg      dexamethasone (DECADRON) injection 10 mg      etoposide (VEPESID) 280 mg in sodium chloride 0.9 % 700 mL chemo infusion      sodium chloride 0.9 % injection 10 mL      heparin lock flush 100 unit/mL      heparin lock flush 100 unit/mL      alteplase (CATHFLO ACTIVASE) injection 2 mg      sodium chloride 0.9 % injection 3 mL      Hot Pack 1 packet      0.9 %  sodium chloride infusion 05/12/2014      SCHEDULING COMMUNICATION      ondansetron (ZOFRAN) IVPB 8 mg      dexamethasone (DECADRON) injection 10 mg      etoposide (VEPESID) 280 mg in sodium chloride 0.9 % 700 mL chemo infusion      sodium chloride 0.9 % injection 10 mL      heparin lock flush 100 unit/mL      heparin lock flush 100 unit/mL      alteplase (CATHFLO ACTIVASE) injection 2 mg      sodium chloride 0.9 % injection 3 mL      Hot Pack 1 packet      0.9 %  sodium chloride infusion 05/13/2014      SCHEDULING  COMMUNICATION INJECTION      pegfilgrastim (NEULASTA) injection 6 mg    ROS  Past Medical History  Diagnosis Date  . Diabetes mellitus without complication   . Hypertension   . Smoking addiction   . Cancer     lung and liver    History reviewed. No pertinent past surgical history.  has Hypertension; Hyperlipidemia; Diabetes mellitus; Overweight; Nicotine addiction; Reactive airway disease; Mass of lower lobe of left lung; Small cell lung cancer; Mucositis due to antineoplastic therapy; and Peripheral edema on his problem list.     has No Known Allergies.    Medication List       This list is accurate as of: 05/10/14  5:41 PM.  Always use your most recent med list.               albuterol 108 (90 BASE) MCG/ACT inhaler  Commonly known as:  PROVENTIL HFA;VENTOLIN HFA  Inhale 2 puffs into the lungs every 6 (six) hours as needed for wheezing.     amLODipine 10 MG  tablet  Commonly known as:  NORVASC  Take 10 mg by mouth daily.     aspirin EC 81 MG tablet  Take 81 mg by mouth daily.     atorvastatin 40 MG tablet  Commonly known as:  LIPITOR  Take 40 mg by mouth daily.     calcium-vitamin D 500-200 MG-UNIT per tablet  Commonly known as:  OSCAL WITH D  Take 1 tablet by mouth.     CENTRUM SILVER ADULT 50+ PO  Take by mouth.     fenofibrate 160 MG tablet  Take 160 mg by mouth daily.     Fluticasone-Salmeterol 250-50 MCG/DOSE Aepb  Commonly known as:  ADVAIR  Inhale 1 puff into the lungs every 12 (twelve) hours.     HYDROcodone-acetaminophen 5-325 MG per tablet  Commonly known as:  NORCO/VICODIN  Take 1 tablet by mouth every 6 (six) hours as needed for moderate pain.     metFORMIN 500 MG tablet  Commonly known as:  GLUCOPHAGE  Take 500 mg by mouth daily with breakfast.     metoprolol tartrate 25 MG tablet  Commonly known as:  LOPRESSOR  Take 25 mg by mouth daily.     prochlorperazine 10 MG tablet  Commonly known as:  COMPAZINE  Take 1 tablet (10 mg total)  by mouth every 6 (six) hours as needed for nausea or vomiting.     pyridOXINE 100 MG tablet  Commonly known as:  VITAMIN B-6  Take 100 mg by mouth daily.     vitamin C 500 MG tablet  Commonly known as:  ASCORBIC ACID  Take 500 mg by mouth daily.         PHYSICAL EXAMINATION  Blood pressure 140/75, pulse 70, temperature 97.9 F (36.6 C), temperature source Oral, resp. rate 19, height 6\' 2"  (1.88 m), weight 234 lb 11.2 oz (106.459 kg).  Physical Exam  Nursing note and vitals reviewed. Constitutional: He is oriented to person, place, and time and well-developed, well-nourished, and in no distress. No distress.  HENT:  Head: Normocephalic and atraumatic.  Mouth/Throat: Oropharynx is clear and moist. No oropharyngeal exudate.  Eyes: Conjunctivae and EOM are normal. Pupils are equal, round, and reactive to light. No scleral icterus.  Neck: Normal range of motion. Neck supple. No thyromegaly present.  Cardiovascular: Normal rate, regular rhythm, normal heart sounds and intact distal pulses.  Exam reveals no friction rub.   No murmur heard. Pulmonary/Chest: Effort normal and breath sounds normal. No respiratory distress.  Abdominal: Soft. Bowel sounds are normal. He exhibits no distension and no mass. There is no tenderness. There is no rebound.  Musculoskeletal: Normal range of motion. He exhibits edema. He exhibits no tenderness.  Trace edema only noted to bilateral lower extremities.  No calf tenderness on exam.  All pulses are palpable in all extremities are warm.  Lymphadenopathy:    He has no cervical adenopathy.  Neurological: He is alert and oriented to person, place, and time. Gait normal.  Skin: Skin is warm and dry. No rash noted. No erythema.  Psychiatric: Affect normal.    LABORATORY DATA:. CBC  Lab Results  Component Value Date   WBC 8.1 05/10/2014   RBC 3.17* 05/10/2014   HGB 10.9* 05/10/2014   HCT 32.4* 05/10/2014   PLT 278 05/10/2014   MCV 102.3* 05/10/2014   MCH  34.2* 05/10/2014   MCHC 33.5 05/10/2014   RDW 20.2* 05/10/2014   LYMPHSABS 2.3 05/10/2014   MONOABS 0.8 05/10/2014   EOSABS  0.1 05/10/2014   BASOSABS 0.0 05/10/2014     CMET  Lab Results  Component Value Date   NA 144 05/10/2014   K 4.0 05/10/2014   CL 101 04/05/2014   CO2 25 05/10/2014   GLUCOSE 137 05/10/2014   BUN 7.1 05/10/2014   CREATININE 0.7 05/10/2014   CALCIUM 8.9 05/10/2014   PROT 6.7 05/10/2014   ALBUMIN 3.6 05/10/2014   AST 26 05/10/2014   ALT 25 05/10/2014   ALKPHOS 110 05/10/2014   BILITOT 0.51 05/10/2014    ASSESSMENT/PLAN:    Small cell lung cancer  Assessment & Plan Patient appears to be tolerating his chemotherapy fairly well.  Reviewed all lab results with patient; and it does appear that all blood counts remained stable.  Patient will proceed today with cycle 4 of his carboplatin AUC 5 and etoposide chemotherapy regimen.  He will receive Neulasta on day 4 of his regimen.  He will be due for a restaging CT with contrast of the chest/abdomen/pelvis following this cycle of chemotherapy.   Mucositis due to antineoplastic therapy  Assessment & Plan Chemotherapy therapy-induced mucositis does appear mild.  Patient was encouraged to use baking soda/salt swish and spit.     Peripheral edema  Assessment & Plan Patient does have trace bilateral lower cavity edema on exam today.  All pulses are palpable in all extremities are warm.  Patient was encouraged to keep his feet elevated above the level of his heart when he is at rest.  Also, he may want to consider compression stockings when he is at work.   Patient stated understanding of all instructions; and was in agreement with this plan of care. The patient knows to call the clinic with any problems, questions or concerns.   This was a shared visit with Dr. Julien Nordmann today.  Total time spent with patient was 25 minutes;  with greater than 75 percent of that time spent in face to face counseling regarding his symptoms, and  coordination of care and follow up.  Disclaimer: This note was dictated with voice recognition software. Similar sounding words can inadvertently be transcribed and may not be corrected upon review.   Drue Second, NP 05/10/2014   ADDENDUM: Hematology/Oncology Attending: I had a face to face encounter with the patient today. I recommended his care plan. This is a very pleasant 53 years old white male with extensive stage small cell lung cancer who is currently undergoing systemic chemotherapy with carboplatin and etoposide status post 3 cycles. The patient is feeling fine today with no specific complaints. He is tolerating his treatment fairly well. We'll proceed with cycle #4 today as scheduled. He would come back for followup visit in 3 weeks after repeating CT scan of the chest, abdomen and pelvis for restaging of his disease.   He was advised to call immediately if he has any concerning symptoms in the interval.  Disclaimer: This note was dictated with voice recognition software. Similar sounding words can inadvertently be transcribed and may be missed upon review. [name], MD 05/10/2014

## 2014-05-10 NOTE — Patient Instructions (Signed)
[facility] Discharge Instructions for Patients Receiving Chemotherapy  Today you received the following chemotherapy agents  Carboplatin Etoposide  To help prevent nausea and vomiting after your treatment, we encourage you to take your nausea medication Zofran   If you develop nausea and vomiting that is not controlled by your nausea medication, call the clinic.   BELOW ARE SYMPTOMS THAT SHOULD BE REPORTED IMMEDIATELY:  *FEVER GREATER THAN 100.5 F  *CHILLS WITH OR WITHOUT FEVER  NAUSEA AND VOMITING THAT IS NOT CONTROLLED WITH YOUR NAUSEA MEDICATION  *UNUSUAL SHORTNESS OF BREATH  *UNUSUAL BRUISING OR BLEEDING  TENDERNESS IN MOUTH AND THROAT WITH OR WITHOUT PRESENCE OF ULCERS  *URINARY PROBLEMS  *BOWEL PROBLEMS  UNUSUAL RASH Items with * indicate a potential emergency and should be followed up as soon as possible.  Feel free to call the clinic you have any questions or concerns. The clinic phone number is [phone].

## 2014-05-10 NOTE — Assessment & Plan Note (Signed)
Patient appears to be tolerating his chemotherapy fairly well.  Reviewed all lab results with patient; and it does appear that all blood counts remained stable.  Patient will proceed today with cycle 4 of his carboplatin AUC 5 and etoposide chemotherapy regimen.  He will receive Neulasta on day 4 of his regimen.  He will be due for a restaging CT with contrast of the chest/abdomen/pelvis following this cycle of chemotherapy.

## 2014-05-10 NOTE — Telephone Encounter (Signed)
gv adn printed appt sched anda vs for pt for Sept and OCT....sed added tx.

## 2014-05-10 NOTE — Assessment & Plan Note (Signed)
Chemotherapy therapy-induced mucositis does appear mild.  Patient was encouraged to use baking soda/salt swish and spit.

## 2014-05-10 NOTE — Assessment & Plan Note (Signed)
Patient does have trace bilateral lower cavity edema on exam today.  All pulses are palpable in all extremities are warm.  Patient was encouraged to keep his feet elevated above the level of his heart when he is at rest.  Also, he may want to consider compression stockings when he is at work.

## 2014-05-11 ENCOUNTER — Other Ambulatory Visit: Payer: Federal, State, Local not specified - PPO

## 2014-05-11 ENCOUNTER — Ambulatory Visit (HOSPITAL_BASED_OUTPATIENT_CLINIC_OR_DEPARTMENT_OTHER): Payer: Federal, State, Local not specified - PPO

## 2014-05-11 ENCOUNTER — Ambulatory Visit: Payer: Federal, State, Local not specified - PPO | Admitting: Nurse Practitioner

## 2014-05-11 VITALS — BP 116/61 | HR 84 | Temp 98.2°F

## 2014-05-11 DIAGNOSIS — Z5111 Encounter for antineoplastic chemotherapy: Secondary | ICD-10-CM

## 2014-05-11 DIAGNOSIS — C3492 Malignant neoplasm of unspecified part of left bronchus or lung: Secondary | ICD-10-CM

## 2014-05-11 DIAGNOSIS — C3430 Malignant neoplasm of lower lobe, unspecified bronchus or lung: Secondary | ICD-10-CM

## 2014-05-11 MED ORDER — SODIUM CHLORIDE 0.9 % IV SOLN
Freq: Once | INTRAVENOUS | Status: AC
  Administered 2014-05-11: 13:00:00 via INTRAVENOUS

## 2014-05-11 MED ORDER — DEXAMETHASONE SODIUM PHOSPHATE 10 MG/ML IJ SOLN
10.0000 mg | Freq: Once | INTRAMUSCULAR | Status: AC
  Administered 2014-05-11: 10 mg via INTRAVENOUS

## 2014-05-11 MED ORDER — ONDANSETRON 8 MG/NS 50 ML IVPB
INTRAVENOUS | Status: AC
  Filled 2014-05-11: qty 8

## 2014-05-11 MED ORDER — ETOPOSIDE CHEMO INJECTION 1 GM/50ML
120.0000 mg/m2 | Freq: Once | INTRAVENOUS | Status: AC
  Administered 2014-05-11: 280 mg via INTRAVENOUS
  Filled 2014-05-11: qty 14

## 2014-05-11 MED ORDER — DEXAMETHASONE SODIUM PHOSPHATE 10 MG/ML IJ SOLN
INTRAMUSCULAR | Status: AC
  Filled 2014-05-11: qty 1

## 2014-05-11 MED ORDER — ONDANSETRON 8 MG/50ML IVPB (CHCC)
8.0000 mg | Freq: Once | INTRAVENOUS | Status: AC
  Administered 2014-05-11: 8 mg via INTRAVENOUS

## 2014-05-11 NOTE — Progress Notes (Signed)
Discharged at 1457, ambulatory in no distress.

## 2014-05-11 NOTE — Patient Instructions (Signed)
[facility] Discharge Instructions for Patients Receiving Chemotherapy  Today you received the following chemotherapy agents:  Etoposide  To help prevent nausea and vomiting after your treatment, we encourage you to take your nausea medication as ordered per MD.   If you develop nausea and vomiting that is not controlled by your nausea medication, call the clinic.   BELOW ARE SYMPTOMS THAT SHOULD BE REPORTED IMMEDIATELY:  *FEVER GREATER THAN 100.5 F  *CHILLS WITH OR WITHOUT FEVER  NAUSEA AND VOMITING THAT IS NOT CONTROLLED WITH YOUR NAUSEA MEDICATION  *UNUSUAL SHORTNESS OF BREATH  *UNUSUAL BRUISING OR BLEEDING  TENDERNESS IN MOUTH AND THROAT WITH OR WITHOUT PRESENCE OF ULCERS  *URINARY PROBLEMS  *BOWEL PROBLEMS  UNUSUAL RASH Items with * indicate a potential emergency and should be followed up as soon as possible.  Feel free to call the clinic you have any questions or concerns. The clinic phone number is [phone].

## 2014-05-11 NOTE — Progress Notes (Signed)
Discharged at 1455, alone, ambulatory in no distress.

## 2014-05-12 ENCOUNTER — Ambulatory Visit (HOSPITAL_BASED_OUTPATIENT_CLINIC_OR_DEPARTMENT_OTHER): Payer: Federal, State, Local not specified - PPO

## 2014-05-12 VITALS — BP 172/70 | HR 74 | Temp 98.3°F

## 2014-05-12 DIAGNOSIS — C3492 Malignant neoplasm of unspecified part of left bronchus or lung: Secondary | ICD-10-CM

## 2014-05-12 DIAGNOSIS — Z5111 Encounter for antineoplastic chemotherapy: Secondary | ICD-10-CM

## 2014-05-12 DIAGNOSIS — C3430 Malignant neoplasm of lower lobe, unspecified bronchus or lung: Secondary | ICD-10-CM

## 2014-05-12 MED ORDER — DEXAMETHASONE SODIUM PHOSPHATE 10 MG/ML IJ SOLN
10.0000 mg | Freq: Once | INTRAMUSCULAR | Status: AC
  Administered 2014-05-12: 10 mg via INTRAVENOUS

## 2014-05-12 MED ORDER — ETOPOSIDE CHEMO INJECTION 1 GM/50ML
120.0000 mg/m2 | Freq: Once | INTRAVENOUS | Status: AC
  Administered 2014-05-12: 280 mg via INTRAVENOUS
  Filled 2014-05-12: qty 14

## 2014-05-12 MED ORDER — DEXAMETHASONE SODIUM PHOSPHATE 10 MG/ML IJ SOLN
INTRAMUSCULAR | Status: AC
Start: 2014-05-12 — End: 2014-05-12
  Filled 2014-05-12: qty 1

## 2014-05-12 MED ORDER — SODIUM CHLORIDE 0.9 % IV SOLN
Freq: Once | INTRAVENOUS | Status: AC
  Administered 2014-05-12: 14:00:00 via INTRAVENOUS

## 2014-05-12 MED ORDER — ONDANSETRON 8 MG/50ML IVPB (CHCC)
8.0000 mg | Freq: Once | INTRAVENOUS | Status: AC
  Administered 2014-05-12: 8 mg via INTRAVENOUS

## 2014-05-12 MED ORDER — ONDANSETRON 8 MG/NS 50 ML IVPB
INTRAVENOUS | Status: AC
  Filled 2014-05-12: qty 8

## 2014-05-12 NOTE — Patient Instructions (Signed)
[facility] Discharge Instructions for Patients Receiving Chemotherapy  Today you received the following chemotherapy agents etoposide.   To help prevent nausea and vomiting after your treatment, we encourage you to take your nausea medication as directed.    If you develop nausea and vomiting that is not controlled by your nausea medication, call the clinic.   BELOW ARE SYMPTOMS THAT SHOULD BE REPORTED IMMEDIATELY:  *FEVER GREATER THAN 100.5 F  *CHILLS WITH OR WITHOUT FEVER  NAUSEA AND VOMITING THAT IS NOT CONTROLLED WITH YOUR NAUSEA MEDICATION  *UNUSUAL SHORTNESS OF BREATH  *UNUSUAL BRUISING OR BLEEDING  TENDERNESS IN MOUTH AND THROAT WITH OR WITHOUT PRESENCE OF ULCERS  *URINARY PROBLEMS  *BOWEL PROBLEMS  UNUSUAL RASH Items with * indicate a potential emergency and should be followed up as soon as possible.  Feel free to call the clinic you have any questions or concerns. The clinic phone number is [phone].

## 2014-05-13 ENCOUNTER — Ambulatory Visit (HOSPITAL_BASED_OUTPATIENT_CLINIC_OR_DEPARTMENT_OTHER): Payer: Federal, State, Local not specified - PPO

## 2014-05-13 VITALS — BP 134/72 | HR 81 | Temp 98.2°F

## 2014-05-13 DIAGNOSIS — C3430 Malignant neoplasm of lower lobe, unspecified bronchus or lung: Secondary | ICD-10-CM

## 2014-05-13 DIAGNOSIS — Z5189 Encounter for other specified aftercare: Secondary | ICD-10-CM

## 2014-05-13 DIAGNOSIS — C3492 Malignant neoplasm of unspecified part of left bronchus or lung: Secondary | ICD-10-CM

## 2014-05-13 MED ORDER — PEGFILGRASTIM INJECTION 6 MG/0.6ML
6.0000 mg | Freq: Once | SUBCUTANEOUS | Status: AC
  Administered 2014-05-13: 6 mg via SUBCUTANEOUS
  Filled 2014-05-13: qty 0.6

## 2014-05-19 ENCOUNTER — Other Ambulatory Visit (HOSPITAL_BASED_OUTPATIENT_CLINIC_OR_DEPARTMENT_OTHER): Payer: Federal, State, Local not specified - PPO

## 2014-05-19 DIAGNOSIS — C3430 Malignant neoplasm of lower lobe, unspecified bronchus or lung: Secondary | ICD-10-CM

## 2014-05-19 DIAGNOSIS — C3492 Malignant neoplasm of unspecified part of left bronchus or lung: Secondary | ICD-10-CM

## 2014-05-19 LAB — COMPREHENSIVE METABOLIC PANEL (CC13)
ALT: 31 U/L (ref 0–55)
AST: 20 U/L (ref 5–34)
Albumin: 3.8 g/dL (ref 3.5–5.0)
Alkaline Phosphatase: 117 U/L (ref 40–150)
Anion Gap: 10 mEq/L (ref 3–11)
BUN: 9.6 mg/dL (ref 7.0–26.0)
CO2: 27 mEq/L (ref 22–29)
Calcium: 9.3 mg/dL (ref 8.4–10.4)
Chloride: 106 mEq/L (ref 98–109)
Creatinine: 0.7 mg/dL (ref 0.7–1.3)
Glucose: 167 mg/dl — ABNORMAL HIGH (ref 70–140)
Potassium: 3.6 mEq/L (ref 3.5–5.1)
Sodium: 144 mEq/L (ref 136–145)
Total Bilirubin: 0.6 mg/dL (ref 0.20–1.20)
Total Protein: 6.8 g/dL (ref 6.4–8.3)

## 2014-05-19 LAB — CBC WITH DIFFERENTIAL/PLATELET
BASO%: 1.4 % (ref 0.0–2.0)
Basophils Absolute: 0.1 10*3/uL (ref 0.0–0.1)
EOS%: 1.2 % (ref 0.0–7.0)
Eosinophils Absolute: 0.1 10*3/uL (ref 0.0–0.5)
HCT: 25.8 % — ABNORMAL LOW (ref 38.4–49.9)
HGB: 8.6 g/dL — ABNORMAL LOW (ref 13.0–17.1)
LYMPH%: 44.2 % (ref 14.0–49.0)
MCH: 33.9 pg — ABNORMAL HIGH (ref 27.2–33.4)
MCHC: 33.3 g/dL (ref 32.0–36.0)
MCV: 101.6 fL — ABNORMAL HIGH (ref 79.3–98.0)
MONO#: 0.4 10*3/uL (ref 0.1–0.9)
MONO%: 10.1 % (ref 0.0–14.0)
NEUT#: 1.9 10*3/uL (ref 1.5–6.5)
NEUT%: 43.1 % (ref 39.0–75.0)
Platelets: 73 10*3/uL — ABNORMAL LOW (ref 140–400)
RBC: 2.54 10*6/uL — ABNORMAL LOW (ref 4.20–5.82)
RDW: 16.2 % — ABNORMAL HIGH (ref 11.0–14.6)
WBC: 4.3 10*3/uL (ref 4.0–10.3)
lymph#: 1.9 10*3/uL (ref 0.9–3.3)
nRBC: 0 % (ref 0–0)

## 2014-05-24 ENCOUNTER — Ambulatory Visit (HOSPITAL_COMMUNITY)
Admission: RE | Admit: 2014-05-24 | Discharge: 2014-05-24 | Disposition: A | Discharge status: Home / Self Care | Payer: Federal, State, Local not specified - PPO | Source: Ambulatory Visit | Attending: Nurse Practitioner | Admitting: Nurse Practitioner

## 2014-05-24 ENCOUNTER — Other Ambulatory Visit: Payer: Self-pay | Admitting: Nurse Practitioner

## 2014-05-24 ENCOUNTER — Other Ambulatory Visit (HOSPITAL_BASED_OUTPATIENT_CLINIC_OR_DEPARTMENT_OTHER): Payer: Federal, State, Local not specified - PPO

## 2014-05-24 DIAGNOSIS — C3492 Malignant neoplasm of unspecified part of left bronchus or lung: Secondary | ICD-10-CM

## 2014-05-24 DIAGNOSIS — C3430 Malignant neoplasm of lower lobe, unspecified bronchus or lung: Secondary | ICD-10-CM | POA: Insufficient documentation

## 2014-05-24 DIAGNOSIS — R599 Enlarged lymph nodes, unspecified: Secondary | ICD-10-CM | POA: Diagnosis not present

## 2014-05-24 DIAGNOSIS — I2510 Atherosclerotic heart disease of native coronary artery without angina pectoris: Secondary | ICD-10-CM | POA: Insufficient documentation

## 2014-05-24 DIAGNOSIS — C7952 Secondary malignant neoplasm of bone marrow: Secondary | ICD-10-CM

## 2014-05-24 DIAGNOSIS — C3490 Malignant neoplasm of unspecified part of unspecified bronchus or lung: Secondary | ICD-10-CM

## 2014-05-24 DIAGNOSIS — C787 Secondary malignant neoplasm of liver and intrahepatic bile duct: Secondary | ICD-10-CM

## 2014-05-24 DIAGNOSIS — C7951 Secondary malignant neoplasm of bone: Secondary | ICD-10-CM | POA: Diagnosis not present

## 2014-05-24 LAB — CBC WITH DIFFERENTIAL/PLATELET
BASO%: 0.7 % (ref 0.0–2.0)
Basophils Absolute: 0 10*3/uL (ref 0.0–0.1)
EOS%: 1.8 % (ref 0.0–7.0)
Eosinophils Absolute: 0.1 10*3/uL (ref 0.0–0.5)
HCT: 28.8 % — ABNORMAL LOW (ref 38.4–49.9)
HGB: 9.6 g/dL — ABNORMAL LOW (ref 13.0–17.1)
LYMPH%: 32.6 % (ref 14.0–49.0)
MCH: 35.6 pg — AB (ref 27.2–33.4)
MCHC: 33.3 g/dL (ref 32.0–36.0)
MCV: 106.7 fL — ABNORMAL HIGH (ref 79.3–98.0)
MONO#: 0.7 10*3/uL (ref 0.1–0.9)
MONO%: 10.5 % (ref 0.0–14.0)
NEUT#: 3.8 10*3/uL (ref 1.5–6.5)
NEUT%: 54.4 % (ref 39.0–75.0)
Platelets: 133 10*3/uL — ABNORMAL LOW (ref 140–400)
RBC: 2.7 10*6/uL — AB (ref 4.20–5.82)
RDW: 18.5 % — AB (ref 11.0–14.6)
WBC: 6.9 10*3/uL (ref 4.0–10.3)
lymph#: 2.3 10*3/uL (ref 0.9–3.3)

## 2014-05-24 LAB — COMPREHENSIVE METABOLIC PANEL (CC13)
ALT: 26 U/L (ref 0–55)
AST: 20 U/L (ref 5–34)
Albumin: 3.8 g/dL (ref 3.5–5.0)
Alkaline Phosphatase: 102 U/L (ref 40–150)
Anion Gap: 9 mEq/L (ref 3–11)
BILIRUBIN TOTAL: 0.4 mg/dL (ref 0.20–1.20)
BUN: 4.9 mg/dL — ABNORMAL LOW (ref 7.0–26.0)
CO2: 29 mEq/L (ref 22–29)
CREATININE: 0.8 mg/dL (ref 0.7–1.3)
Calcium: 9.5 mg/dL (ref 8.4–10.4)
Chloride: 107 mEq/L (ref 98–109)
Glucose: 150 mg/dl — ABNORMAL HIGH (ref 70–140)
Potassium: 4.3 mEq/L (ref 3.5–5.1)
Sodium: 145 mEq/L (ref 136–145)
Total Protein: 6.8 g/dL (ref 6.4–8.3)

## 2014-05-24 MED ORDER — IOHEXOL 300 MG/ML  SOLN
100.0000 mL | Freq: Once | INTRAMUSCULAR | Status: AC | PRN
  Administered 2014-05-24: 100 mL via INTRAVENOUS

## 2014-05-31 ENCOUNTER — Ambulatory Visit (HOSPITAL_BASED_OUTPATIENT_CLINIC_OR_DEPARTMENT_OTHER): Payer: Federal, State, Local not specified - PPO

## 2014-05-31 ENCOUNTER — Other Ambulatory Visit (HOSPITAL_BASED_OUTPATIENT_CLINIC_OR_DEPARTMENT_OTHER): Payer: Federal, State, Local not specified - PPO

## 2014-05-31 ENCOUNTER — Ambulatory Visit (HOSPITAL_BASED_OUTPATIENT_CLINIC_OR_DEPARTMENT_OTHER): Payer: Federal, State, Local not specified - PPO | Admitting: Physician Assistant

## 2014-05-31 ENCOUNTER — Telehealth: Payer: Self-pay | Admitting: Internal Medicine

## 2014-05-31 ENCOUNTER — Encounter: Payer: Self-pay | Admitting: Physician Assistant

## 2014-05-31 VITALS — BP 126/62 | HR 77 | Temp 98.3°F | Resp 18 | Ht 73.0 in | Wt 237.8 lb

## 2014-05-31 DIAGNOSIS — C7B8 Other secondary neuroendocrine tumors: Secondary | ICD-10-CM

## 2014-05-31 DIAGNOSIS — C7A1 Malignant poorly differentiated neuroendocrine tumors: Secondary | ICD-10-CM

## 2014-05-31 DIAGNOSIS — C3492 Malignant neoplasm of unspecified part of left bronchus or lung: Secondary | ICD-10-CM

## 2014-05-31 DIAGNOSIS — Z5111 Encounter for antineoplastic chemotherapy: Secondary | ICD-10-CM

## 2014-05-31 LAB — COMPREHENSIVE METABOLIC PANEL (CC13)
ALK PHOS: 84 U/L (ref 40–150)
ALT: 25 U/L (ref 0–55)
AST: 24 U/L (ref 5–34)
Albumin: 3.8 g/dL (ref 3.5–5.0)
Anion Gap: 10 mEq/L (ref 3–11)
BILIRUBIN TOTAL: 0.34 mg/dL (ref 0.20–1.20)
BUN: 6.3 mg/dL — ABNORMAL LOW (ref 7.0–26.0)
CO2: 27 mEq/L (ref 22–29)
Calcium: 9.3 mg/dL (ref 8.4–10.4)
Chloride: 107 mEq/L (ref 98–109)
Creatinine: 0.8 mg/dL (ref 0.7–1.3)
Glucose: 136 mg/dl (ref 70–140)
Potassium: 3.8 mEq/L (ref 3.5–5.1)
Sodium: 144 mEq/L (ref 136–145)
Total Protein: 6.9 g/dL (ref 6.4–8.3)

## 2014-05-31 LAB — CBC WITH DIFFERENTIAL/PLATELET
BASO%: 0.9 % (ref 0.0–2.0)
Basophils Absolute: 0.1 10*3/uL (ref 0.0–0.1)
EOS ABS: 0.1 10*3/uL (ref 0.0–0.5)
EOS%: 1.3 % (ref 0.0–7.0)
HCT: 32.3 % — ABNORMAL LOW (ref 38.4–49.9)
HEMOGLOBIN: 10.6 g/dL — AB (ref 13.0–17.1)
LYMPH%: 33.7 % (ref 14.0–49.0)
MCH: 34.5 pg — AB (ref 27.2–33.4)
MCHC: 32.8 g/dL (ref 32.0–36.0)
MCV: 105.2 fL — AB (ref 79.3–98.0)
MONO#: 0.9 10*3/uL (ref 0.1–0.9)
MONO%: 12.7 % (ref 0.0–14.0)
NEUT#: 3.6 10*3/uL (ref 1.5–6.5)
NEUT%: 51.4 % (ref 39.0–75.0)
Platelets: 325 10*3/uL (ref 140–400)
RBC: 3.07 10*6/uL — ABNORMAL LOW (ref 4.20–5.82)
RDW: 17.3 % — ABNORMAL HIGH (ref 11.0–14.6)
WBC: 6.9 10*3/uL (ref 4.0–10.3)
lymph#: 2.3 10*3/uL (ref 0.9–3.3)

## 2014-05-31 MED ORDER — DEXAMETHASONE SODIUM PHOSPHATE 20 MG/5ML IJ SOLN
20.0000 mg | Freq: Once | INTRAMUSCULAR | Status: AC
  Administered 2014-05-31: 20 mg via INTRAVENOUS

## 2014-05-31 MED ORDER — SODIUM CHLORIDE 0.9 % IV SOLN
120.0000 mg/m2 | Freq: Once | INTRAVENOUS | Status: AC
  Administered 2014-05-31: 280 mg via INTRAVENOUS
  Filled 2014-05-31: qty 14

## 2014-05-31 MED ORDER — ONDANSETRON 16 MG/50ML IVPB (CHCC)
16.0000 mg | Freq: Once | INTRAVENOUS | Status: AC
  Administered 2014-05-31: 16 mg via INTRAVENOUS

## 2014-05-31 MED ORDER — SODIUM CHLORIDE 0.9 % IV SOLN
Freq: Once | INTRAVENOUS | Status: AC
  Administered 2014-05-31: 15:00:00 via INTRAVENOUS

## 2014-05-31 MED ORDER — ONDANSETRON 16 MG/50ML IVPB (CHCC)
INTRAVENOUS | Status: AC
  Filled 2014-05-31: qty 16

## 2014-05-31 MED ORDER — DEXAMETHASONE SODIUM PHOSPHATE 20 MG/5ML IJ SOLN
INTRAMUSCULAR | Status: AC
Start: 2014-05-31 — End: 2014-05-31
  Filled 2014-05-31: qty 5

## 2014-05-31 MED ORDER — SODIUM CHLORIDE 0.9 % IV SOLN
750.0000 mg | Freq: Once | INTRAVENOUS | Status: AC
  Administered 2014-05-31: 750 mg via INTRAVENOUS
  Filled 2014-05-31: qty 75

## 2014-05-31 NOTE — Telephone Encounter (Signed)
gv and printed appt sched and avs for pt for OCT.Marland KitchenMarland KitchenMarland Kitchen

## 2014-05-31 NOTE — Progress Notes (Addendum)
[facility] [phone]   Fax:(336) [place], [address] [place] [address]  DIAGNOSIS: extensive stage, Stage IV (T2a, N1, M1b) small cell lung cancer diagnosed in July of 2015.  PRIOR THERAPY: None  CURRENT THERAPY: systemic chemotherapy with carboplatin for AUC of 5 on day 1 and etoposide at 120 mg/M2 on days 1, 2 and 3 with Neulasta support on day 4. Status post 4 cycles.   INTERVAL HISTORY: Fabiano Ginley 53 y.o. male returns to the clinic today for follow up visit. The patient tolerated the second cycle of his treatment fairly well with no significant adverse effects. He denied having any significant fever or chills, no nausea or vomiting. He denied having any significant weight loss or night sweats. The patient has no significant chest pain but continues to have shortness of breath with exertion with no cough or hemoptysis. He had repeat CT scan of the chest, abdomen and pelvis performed recently and he is here for evaluation and discussion of his scan results.He also presents to proceed with cycle #5.  MEDICAL HISTORY: Past Medical History  Diagnosis Date  . Diabetes mellitus without complication   . Hypertension   . Smoking addiction   . Cancer     lung and liver    ALLERGIES:  has No Known Allergies.  MEDICATIONS:  Current Outpatient Prescriptions  Medication Sig Dispense Refill  . albuterol (PROVENTIL HFA;VENTOLIN HFA) 108 (90 BASE) MCG/ACT inhaler Inhale 2 puffs into the lungs every 6 (six) hours as needed for wheezing.  1 Inhaler  0  . amLODipine (NORVASC) 10 MG tablet Take 10 mg by mouth daily.      Marland Kitchen aspirin EC 81 MG tablet Take 81 mg by mouth daily.      Marland Kitchen atorvastatin (LIPITOR) 40 MG tablet Take 40 mg by mouth daily.      . calcium-vitamin D (OSCAL WITH D) 500-200 MG-UNIT per tablet Take 1 tablet by mouth.      . fenofibrate 160 MG tablet Take 160 mg by mouth  daily.      . Fluticasone-Salmeterol (ADVAIR) 250-50 MCG/DOSE AEPB Inhale 1 puff into the lungs every 12 (twelve) hours.      Marland Kitchen glipiZIDE (GLUCOTROL XL) 2.5 MG 24 hr tablet       . HYDROcodone-acetaminophen (NORCO/VICODIN) 5-325 MG per tablet Take 1 tablet by mouth every 6 (six) hours as needed for moderate pain.  15 tablet  0  . metFORMIN (GLUCOPHAGE) 500 MG tablet Take 1,000 mg by mouth 2 (two) times daily with a meal.       . metoprolol tartrate (LOPRESSOR) 25 MG tablet Take 25 mg by mouth daily.       . Multiple Vitamins-Minerals (CENTRUM SILVER ADULT 50+ PO) Take by mouth.      . ONE TOUCH ULTRA TEST test strip       . pantoprazole (PROTONIX) 40 MG tablet       . prochlorperazine (COMPAZINE) 10 MG tablet Take 1 tablet (10 mg total) by mouth every 6 (six) hours as needed for nausea or vomiting.  30 tablet  0  . pyridOXINE (VITAMIN B-6) 100 MG tablet Take 100 mg by mouth daily.      . vitamin C (ASCORBIC ACID) 500 MG tablet Take 500 mg by mouth daily.       No current facility-administered medications for this visit.   Facility-Administered Medications Ordered in Other  Visits  Medication Dose Route Frequency Provider Last Rate Last Dose  . etoposide (VEPESID) 280 mg in sodium chloride 0.9 % 700 mL chemo infusion  120 mg/m2 (Treatment Plan Actual) Intravenous Once Curt Bears, MD 714 mL/hr at 05/31/14 1618 280 mg at 05/31/14 1618    REVIEW OF SYSTEMS:  Constitutional: positive for fatigue Eyes: negative Ears, nose, mouth, throat, and face: negative Respiratory: positive for dyspnea on exertion Cardiovascular: negative Gastrointestinal: negative Genitourinary:negative Integument/breast: negative Hematologic/lymphatic: negative Musculoskeletal:negative Neurological: negative Behavioral/Psych: negative Endocrine: negative Allergic/Immunologic: negative   PHYSICAL EXAMINATION: General appearance: alert, cooperative and no distress Head: Normocephalic, without obvious  abnormality, atraumatic Neck: no adenopathy, no JVD, supple, symmetrical, trachea midline and thyroid not enlarged, symmetric, no tenderness/mass/nodules Lymph nodes: Cervical, supraclavicular, and axillary nodes normal. Resp: clear to auscultation bilaterally Back: symmetric, no curvature. ROM normal. No CVA tenderness. Cardio: regular rate and rhythm, S1, S2 normal, no murmur, click, rub or gallop GI: soft, non-tender; bowel sounds normal; no masses,  no organomegaly Extremities: extremities normal, atraumatic, no cyanosis or edema Neurologic: Alert and oriented X 3, normal strength and tone. Normal symmetric reflexes. Normal coordination and gait  ECOG PERFORMANCE STATUS: 1 - Symptomatic but completely ambulatory  Blood pressure 126/62, pulse 77, temperature 98.3 F (36.8 C), temperature source Oral, resp. rate 18, height 6\' 1"  (1.854 m), weight 237 lb 12.8 oz (107.865 kg), SpO2 94.00%.  LABORATORY DATA: Lab Results  Component Value Date   WBC 6.9 05/31/2014   HGB 10.6* 05/31/2014   HCT 32.3* 05/31/2014   MCV 105.2* 05/31/2014   PLT 325 05/31/2014      Chemistry      Component Value Date/Time   NA 144 05/31/2014 1313   NA 136 04/05/2014 1338   K 3.8 05/31/2014 1313   K 3.9 04/05/2014 1338   CL 101 04/05/2014 1338   CO2 27 05/31/2014 1313   CO2 27 04/05/2014 1338   BUN 6.3* 05/31/2014 1313   BUN 10 04/05/2014 1338   CREATININE 0.8 05/31/2014 1313   CREATININE 0.64 04/05/2014 1338      Component Value Date/Time   CALCIUM 9.3 05/31/2014 1313   CALCIUM 8.9 04/05/2014 1338   ALKPHOS 84 05/31/2014 1313   ALKPHOS 139* 04/05/2014 1338   AST 24 05/31/2014 1313   AST 13 04/05/2014 1338   ALT 25 05/31/2014 1313   ALT 17 04/05/2014 1338   BILITOT 0.34 05/31/2014 1313   BILITOT 0.5 04/05/2014 1338       RADIOGRAPHIC STUDIES: Ct Chest W Contrast  05/24/2014   CLINICAL DATA:  Lung cancer.  EXAM: CT CHEST, ABDOMEN, AND PELVIS WITH CONTRAST  TECHNIQUE: Multidetector CT imaging of the chest, abdomen  and pelvis was performed following the standard protocol during bolus administration of intravenous contrast.  CONTRAST:  132mL OMNIPAQUE IOHEXOL 300 MG/ML  SOLN  COMPARISON:  04/15/2014.  FINDINGS: CT CHEST FINDINGS  Thoracic aorta is normal in caliber. No evidence of dissection or aneurysm. Pulmonary arteries are normal. Heart size normal. Coronary artery disease  No significant mediastinal or hilar lymph nodes are present. Previously identified left hilar 6 mm lymph node now measures 4 mm in diameter. Shotty left peritracheal lymph nodes are stable. Thoracic esophagus is unremarkable.  Large airways are patent. Previously defined left lower lobe 2.2x 2.7 cm mass has diminished in size and now measures 1.6 x 2.3 cm. No other significant pulmonary abnormalities identified. No pleural effusion or pneumothorax.  Visualized thyroid unremarkable. No supraclavicular or axillary lymph nodes.  CT ABDOMEN  AND PELVIS FINDINGS  Diffuse fatty infiltration of the liver noted. No focal hepatic lesion identified. Spleen is normal. Pancreas is normal. No biliary distention. Small gallstone. Previously identified hepatic duodenal 1.6 cm lymph node now measures 1.3 cm.  Adrenals normal. No focal renal abnormality. No hydronephrosis or obstructing ureteral stone. Bladder is nondistended. Mild bladder wall thickening is present.  No significant inguinal lymph nodes are present. No significant retroperitoneal lymph nodes. Abdominal aorta is atherosclerotic as are its visceral branches. Bilateral renal artery atherosclerotic vascular disease. No aneurysm. Prostate enlargement is stable.  Appendix is unremarkable. There is a focal area narrowing sigmoid colon, best demonstrated on image number 112/ series 2. This may be related to peristalsis. A focal apple-core lesion involving the rectum cannot be excluded. Direct visualization or barium enema suggested. There is no evidence of bowel obstruction. No bowel distention. Stomach is  nondistended. No free air. No significant abdominal wall hernia. Tiny inguinal hernias are present with herniation of fat only. Tiny umbilical hernia noted with herniation of fat only.  Lytic lesion in T7 is unchanged. Stable mild compression T7. Remaining lucencies in the spine are stable. Stable mild compressions of T12, L1, L3. Right femoral neck lucency is unchanged.  IMPRESSION: 1. Findings consistent with response tumor to treatment. Previously identified 2.2 x 2 .7 cm mass in the left lower lobe now measures 1.6 x 2.3 cm. Mediastinal/hilar adenopathy has improved. Largest lymph within in the left hilum measures only 4 mm in diameter. 2. Previously identified hepatic duodenal lymph node has diminished in size from 1.6 cm to 1.3 cm in diameter. 3. Focal narrowing and possible thickening of the rectum. Although this could be related to peristalsis focal rectal lesion including malignancy cannot be excluded and direct visualization or barium enema suggested for further evaluation. 4. Stable bony metastases .   Electronically Signed   By: Marcello Moores  Register   On: 05/24/2014 15:46   Ct Abdomen Pelvis W Contrast  05/24/2014   CLINICAL DATA:  Lung cancer.  EXAM: CT CHEST, ABDOMEN, AND PELVIS WITH CONTRAST  TECHNIQUE: Multidetector CT imaging of the chest, abdomen and pelvis was performed following the standard protocol during bolus administration of intravenous contrast.  CONTRAST:  158mL OMNIPAQUE IOHEXOL 300 MG/ML  SOLN  COMPARISON:  04/15/2014.  FINDINGS: CT CHEST FINDINGS  Thoracic aorta is normal in caliber. No evidence of dissection or aneurysm. Pulmonary arteries are normal. Heart size normal. Coronary artery disease  No significant mediastinal or hilar lymph nodes are present. Previously identified left hilar 6 mm lymph node now measures 4 mm in diameter. Shotty left peritracheal lymph nodes are stable. Thoracic esophagus is unremarkable.  Large airways are patent. Previously defined left lower lobe 2.2x 2.7  cm mass has diminished in size and now measures 1.6 x 2.3 cm. No other significant pulmonary abnormalities identified. No pleural effusion or pneumothorax.  Visualized thyroid unremarkable. No supraclavicular or axillary lymph nodes.  CT ABDOMEN AND PELVIS FINDINGS  Diffuse fatty infiltration of the liver noted. No focal hepatic lesion identified. Spleen is normal. Pancreas is normal. No biliary distention. Small gallstone. Previously identified hepatic duodenal 1.6 cm lymph node now measures 1.3 cm.  Adrenals normal. No focal renal abnormality. No hydronephrosis or obstructing ureteral stone. Bladder is nondistended. Mild bladder wall thickening is present.  No significant inguinal lymph nodes are present. No significant retroperitoneal lymph nodes. Abdominal aorta is atherosclerotic as are its visceral branches. Bilateral renal artery atherosclerotic vascular disease. No aneurysm. Prostate enlargement is stable.  Appendix  is unremarkable. There is a focal area narrowing sigmoid colon, best demonstrated on image number 112/ series 2. This may be related to peristalsis. A focal apple-core lesion involving the rectum cannot be excluded. Direct visualization or barium enema suggested. There is no evidence of bowel obstruction. No bowel distention. Stomach is nondistended. No free air. No significant abdominal wall hernia. Tiny inguinal hernias are present with herniation of fat only. Tiny umbilical hernia noted with herniation of fat only.  Lytic lesion in T7 is unchanged. Stable mild compression T7. Remaining lucencies in the spine are stable. Stable mild compressions of T12, L1, L3. Right femoral neck lucency is unchanged.  IMPRESSION: 1. Findings consistent with response tumor to treatment. Previously identified 2.2 x 2 .7 cm mass in the left lower lobe now measures 1.6 x 2.3 cm. Mediastinal/hilar adenopathy has improved. Largest lymph within in the left hilum measures only 4 mm in diameter. 2. Previously  identified hepatic duodenal lymph node has diminished in size from 1.6 cm to 1.3 cm in diameter. 3. Focal narrowing and possible thickening of the rectum. Although this could be related to peristalsis focal rectal lesion including malignancy cannot be excluded and direct visualization or barium enema suggested for further evaluation. 4. Stable bony metastases .   Electronically Signed   By: Marcello Moores  Register   On: 05/24/2014 15:46   ASSESSMENT AND PLAN: this is a very pleasant 53 years old white male recently diagnosed with extensive stage small cell lung cancer status post 2 cycles of systemic chemotherapy with carboplatin and etoposide. Status post 4 cycles. Overall he is tolerating his chemotherapy without difficulty. His restaging Ct scan revealed continued response to treatment with decreased tumor size. There was some focal narrowing and possible thickening of the rectum. Although this could be related to peristalsis focal rectal lesion including malignancy could not be excluded and direct visualization or barium enema most suggestive for further evaluation. The patient was discussed with him also seen by Dr. Julien Nordmann. Dr. Julien Nordmann reviewed the CT scan results with Mr. Rudden. We will have him proceed with cycle #5 today as scheduled. We will watch the area of questionable rectal fullness closely on subsequent restaging CT scan. He'll continue with weekly labs as scheduled and return in 3 weeks prior to the start of cycle #6. The patient was advised to call immediately if he has any concerning symptoms in the interval.  The patient voices understanding of current disease status and treatment options and is in agreement with the current care plan.  All questions were answered. The patient knows to call the clinic with any problems, questions or concerns. We can certainly see the patient much sooner if necessary.  Carlton Adam PA-C  ADDENDUM: Hematology/Oncology Attending:  I had a face to face  encounter with the patient. I recommended his care plan. This is a very pleasant 53 years old white male with extensive stage small cell lung cancer currently undergoing systemic chemotherapy with carboplatin and etoposide status post 4 cycles. He is rating his treatment fairly well with no significant adverse effects. The patient is still very active and works at regular time. His recent CT scan of the chest, abdomen and pelvis showed continuous improvement in his disease. I discussed the scan results with the patient today and recommended for him to proceed with 2 more cycles of the same regimen. He would come back for followup visit in 3 weeks with the start of cycle #6. The patient denied having any recent rectal  bleeding and he had colonoscopy 2 years ago that was unremarkable. There is no symptoms or findings 4 concerns regarding this thickening of the rectum seen on the recent scan. It was not seen on the previous scan. We will continue to monitor this closely. He was advised to call immediately if he has any concerning symptoms in the interval.  Disclaimer: This note was dictated with voice recognition software. Similar sounding words can inadvertently be transcribed and may not be corrected upon review. [name], MD 06/03/2014

## 2014-05-31 NOTE — Patient Instructions (Signed)
[facility] Discharge Instructions for Patients Receiving Chemotherapy  Today you received the following chemotherapy agents:   Carboplatin & VP-16 To help prevent nausea and vomiting after your treatment, we encourage you to take your nausea medication   If you develop nausea and vomiting that is not controlled by your nausea medication, call the clinic.   BELOW ARE SYMPTOMS THAT SHOULD BE REPORTED IMMEDIATELY:  *FEVER GREATER THAN 100.5 F  *CHILLS WITH OR WITHOUT FEVER  NAUSEA AND VOMITING THAT IS NOT CONTROLLED WITH YOUR NAUSEA MEDICATION  *UNUSUAL SHORTNESS OF BREATH  *UNUSUAL BRUISING OR BLEEDING  TENDERNESS IN MOUTH AND THROAT WITH OR WITHOUT PRESENCE OF ULCERS  *URINARY PROBLEMS  *BOWEL PROBLEMS  UNUSUAL RASH Items with * indicate a potential emergency and should be followed up as soon as possible.  Feel free to call the clinic you have any questions or concerns. The clinic phone number is [phone].

## 2014-06-01 ENCOUNTER — Ambulatory Visit (HOSPITAL_BASED_OUTPATIENT_CLINIC_OR_DEPARTMENT_OTHER): Payer: Federal, State, Local not specified - PPO

## 2014-06-01 ENCOUNTER — Encounter: Payer: Self-pay | Admitting: Internal Medicine

## 2014-06-01 VITALS — BP 114/65 | HR 86 | Temp 98.6°F | Resp 18

## 2014-06-01 DIAGNOSIS — Z5111 Encounter for antineoplastic chemotherapy: Secondary | ICD-10-CM

## 2014-06-01 DIAGNOSIS — C3492 Malignant neoplasm of unspecified part of left bronchus or lung: Secondary | ICD-10-CM

## 2014-06-01 MED ORDER — DEXAMETHASONE SODIUM PHOSPHATE 10 MG/ML IJ SOLN
10.0000 mg | Freq: Once | INTRAMUSCULAR | Status: AC
  Administered 2014-06-01: 10 mg via INTRAVENOUS

## 2014-06-01 MED ORDER — ONDANSETRON 8 MG/50ML IVPB (CHCC)
8.0000 mg | Freq: Once | INTRAVENOUS | Status: AC
  Administered 2014-06-01: 8 mg via INTRAVENOUS

## 2014-06-01 MED ORDER — SODIUM CHLORIDE 0.9 % IV SOLN
120.0000 mg/m2 | Freq: Once | INTRAVENOUS | Status: AC
  Administered 2014-06-01: 280 mg via INTRAVENOUS
  Filled 2014-06-01: qty 14

## 2014-06-01 MED ORDER — ONDANSETRON 8 MG/NS 50 ML IVPB
INTRAVENOUS | Status: AC
  Filled 2014-06-01: qty 8

## 2014-06-01 MED ORDER — DEXAMETHASONE SODIUM PHOSPHATE 10 MG/ML IJ SOLN
INTRAMUSCULAR | Status: AC
  Filled 2014-06-01: qty 1

## 2014-06-01 MED ORDER — SODIUM CHLORIDE 0.9 % IV SOLN
Freq: Once | INTRAVENOUS | Status: AC
  Administered 2014-06-01: 13:00:00 via INTRAVENOUS

## 2014-06-01 NOTE — Progress Notes (Signed)
asst with 1st step neulasta.

## 2014-06-01 NOTE — Patient Instructions (Signed)
[facility] Discharge Instructions for Patients Receiving Chemotherapy  Today you received the following chemotherapy agents: etoposide  To help prevent nausea and vomiting after your treatment, we encourage you to take your nausea medication.  Take it as often as prescribed.     If you develop nausea and vomiting that is not controlled by your nausea medication, call the clinic. If it is after clinic hours your family physician or the after hours number for the clinic or go to the Emergency Department.   BELOW ARE SYMPTOMS THAT SHOULD BE REPORTED IMMEDIATELY:  *FEVER GREATER THAN 100.5 F  *CHILLS WITH OR WITHOUT FEVER  NAUSEA AND VOMITING THAT IS NOT CONTROLLED WITH YOUR NAUSEA MEDICATION  *UNUSUAL SHORTNESS OF BREATH  *UNUSUAL BRUISING OR BLEEDING  TENDERNESS IN MOUTH AND THROAT WITH OR WITHOUT PRESENCE OF ULCERS  *URINARY PROBLEMS  *BOWEL PROBLEMS  UNUSUAL RASH Items with * indicate a potential emergency and should be followed up as soon as possible.  Feel free to call the clinic you have any questions or concerns. The clinic phone number is [phone].   I have been informed and understand all the instructions given to me. I know to contact the clinic, my physician, or go to the Emergency Department if any problems should occur. I do not have any questions at this time, but understand that I may call the clinic during office hours   should I have any questions or need assistance in obtaining follow up care.    __________________________________________  _____________  __________ Signature of Patient or Authorized Representative            Date                   Time    __________________________________________ Nurse's Signature

## 2014-06-02 ENCOUNTER — Ambulatory Visit (HOSPITAL_BASED_OUTPATIENT_CLINIC_OR_DEPARTMENT_OTHER): Payer: Federal, State, Local not specified - PPO

## 2014-06-02 VITALS — BP 124/68 | HR 68 | Temp 98.4°F | Resp 20

## 2014-06-02 DIAGNOSIS — Z5111 Encounter for antineoplastic chemotherapy: Secondary | ICD-10-CM

## 2014-06-02 DIAGNOSIS — C7B8 Other secondary neuroendocrine tumors: Secondary | ICD-10-CM

## 2014-06-02 DIAGNOSIS — C7A1 Malignant poorly differentiated neuroendocrine tumors: Secondary | ICD-10-CM

## 2014-06-02 DIAGNOSIS — C3492 Malignant neoplasm of unspecified part of left bronchus or lung: Secondary | ICD-10-CM

## 2014-06-02 MED ORDER — DEXAMETHASONE SODIUM PHOSPHATE 10 MG/ML IJ SOLN
10.0000 mg | Freq: Once | INTRAMUSCULAR | Status: AC
  Administered 2014-06-02: 10 mg via INTRAVENOUS

## 2014-06-02 MED ORDER — SODIUM CHLORIDE 0.9 % IV SOLN
Freq: Once | INTRAVENOUS | Status: AC
  Administered 2014-06-02: 14:00:00 via INTRAVENOUS

## 2014-06-02 MED ORDER — SODIUM CHLORIDE 0.9 % IV SOLN
120.0000 mg/m2 | Freq: Once | INTRAVENOUS | Status: AC
  Administered 2014-06-02: 280 mg via INTRAVENOUS
  Filled 2014-06-02: qty 14

## 2014-06-02 MED ORDER — DEXAMETHASONE SODIUM PHOSPHATE 10 MG/ML IJ SOLN
INTRAMUSCULAR | Status: AC
  Filled 2014-06-02: qty 1

## 2014-06-02 MED ORDER — ONDANSETRON 8 MG/NS 50 ML IVPB
INTRAVENOUS | Status: AC
  Filled 2014-06-02: qty 8

## 2014-06-02 MED ORDER — ONDANSETRON 8 MG/50ML IVPB (CHCC)
8.0000 mg | Freq: Once | INTRAVENOUS | Status: AC
  Administered 2014-06-02: 8 mg via INTRAVENOUS

## 2014-06-02 NOTE — Patient Instructions (Signed)
[facility] Discharge Instructions for Patients Receiving Chemotherapy  Today you received the following chemotherapy agents :  Etoposide.  To help prevent nausea and vomiting after your treatment, we encourage you to take your nausea medication as instructed by your physician.   If you develop nausea and vomiting that is not controlled by your nausea medication, call the clinic.   BELOW ARE SYMPTOMS THAT SHOULD BE REPORTED IMMEDIATELY:  *FEVER GREATER THAN 100.5 F  *CHILLS WITH OR WITHOUT FEVER  NAUSEA AND VOMITING THAT IS NOT CONTROLLED WITH YOUR NAUSEA MEDICATION  *UNUSUAL SHORTNESS OF BREATH  *UNUSUAL BRUISING OR BLEEDING  TENDERNESS IN MOUTH AND THROAT WITH OR WITHOUT PRESENCE OF ULCERS  *URINARY PROBLEMS  *BOWEL PROBLEMS  UNUSUAL RASH Items with * indicate a potential emergency and should be followed up as soon as possible.  Feel free to call the clinic you have any questions or concerns. The clinic phone number is [phone].

## 2014-06-03 ENCOUNTER — Ambulatory Visit (HOSPITAL_BASED_OUTPATIENT_CLINIC_OR_DEPARTMENT_OTHER): Payer: Federal, State, Local not specified - PPO

## 2014-06-03 VITALS — BP 133/66 | HR 68 | Temp 97.6°F

## 2014-06-03 DIAGNOSIS — C7A1 Malignant poorly differentiated neuroendocrine tumors: Secondary | ICD-10-CM

## 2014-06-03 DIAGNOSIS — C3492 Malignant neoplasm of unspecified part of left bronchus or lung: Secondary | ICD-10-CM

## 2014-06-03 DIAGNOSIS — Z5189 Encounter for other specified aftercare: Secondary | ICD-10-CM

## 2014-06-03 DIAGNOSIS — C7B8 Other secondary neuroendocrine tumors: Secondary | ICD-10-CM

## 2014-06-03 MED ORDER — PEGFILGRASTIM INJECTION 6 MG/0.6ML
6.0000 mg | Freq: Once | SUBCUTANEOUS | Status: AC
  Administered 2014-06-03: 6 mg via SUBCUTANEOUS
  Filled 2014-06-03: qty 0.6

## 2014-06-03 NOTE — Patient Instructions (Signed)
Your CT scan revealed further improvement in your disease. There is an area of fullness in the rectum the we will continue to follow on subsequent CT scans. Continue labs and chemotherapy as scheduled Followup in 3 weeks

## 2014-06-07 ENCOUNTER — Other Ambulatory Visit (HOSPITAL_BASED_OUTPATIENT_CLINIC_OR_DEPARTMENT_OTHER): Payer: Federal, State, Local not specified - PPO

## 2014-06-07 DIAGNOSIS — C7B8 Other secondary neuroendocrine tumors: Secondary | ICD-10-CM

## 2014-06-07 DIAGNOSIS — C7A1 Malignant poorly differentiated neuroendocrine tumors: Secondary | ICD-10-CM

## 2014-06-07 DIAGNOSIS — C3492 Malignant neoplasm of unspecified part of left bronchus or lung: Secondary | ICD-10-CM

## 2014-06-07 LAB — COMPREHENSIVE METABOLIC PANEL (CC13)
ALT: 23 U/L (ref 0–55)
ANION GAP: 13 meq/L — AB (ref 3–11)
AST: 16 U/L (ref 5–34)
Albumin: 4 g/dL (ref 3.5–5.0)
Alkaline Phosphatase: 105 U/L (ref 40–150)
BUN: 13.6 mg/dL (ref 7.0–26.0)
CALCIUM: 9.5 mg/dL (ref 8.4–10.4)
CHLORIDE: 105 meq/L (ref 98–109)
CO2: 25 meq/L (ref 22–29)
Creatinine: 0.7 mg/dL (ref 0.7–1.3)
Glucose: 189 mg/dl — ABNORMAL HIGH (ref 70–140)
Potassium: 3.7 mEq/L (ref 3.5–5.1)
SODIUM: 143 meq/L (ref 136–145)
TOTAL PROTEIN: 6.9 g/dL (ref 6.4–8.3)
Total Bilirubin: 1.17 mg/dL (ref 0.20–1.20)

## 2014-06-07 LAB — CBC WITH DIFFERENTIAL/PLATELET
BASO%: 0.3 % (ref 0.0–2.0)
Basophils Absolute: 0 10*3/uL (ref 0.0–0.1)
EOS ABS: 0 10*3/uL (ref 0.0–0.5)
EOS%: 0.6 % (ref 0.0–7.0)
HEMATOCRIT: 28.3 % — AB (ref 38.4–49.9)
HGB: 9.7 g/dL — ABNORMAL LOW (ref 13.0–17.1)
LYMPH%: 23.4 % (ref 14.0–49.0)
MCH: 35.4 pg — ABNORMAL HIGH (ref 27.2–33.4)
MCHC: 34.3 g/dL (ref 32.0–36.0)
MCV: 103.3 fL — ABNORMAL HIGH (ref 79.3–98.0)
MONO#: 0.5 10*3/uL (ref 0.1–0.9)
MONO%: 7.9 % (ref 0.0–14.0)
NEUT%: 67.8 % (ref 39.0–75.0)
NEUTROS ABS: 4.2 10*3/uL (ref 1.5–6.5)
Platelets: 132 10*3/uL — ABNORMAL LOW (ref 140–400)
RBC: 2.74 10*6/uL — AB (ref 4.20–5.82)
RDW: 14.9 % — ABNORMAL HIGH (ref 11.0–14.6)
WBC: 6.2 10*3/uL (ref 4.0–10.3)
lymph#: 1.5 10*3/uL (ref 0.9–3.3)

## 2014-06-14 ENCOUNTER — Other Ambulatory Visit (HOSPITAL_BASED_OUTPATIENT_CLINIC_OR_DEPARTMENT_OTHER): Payer: Federal, State, Local not specified - PPO

## 2014-06-14 DIAGNOSIS — C7A1 Malignant poorly differentiated neuroendocrine tumors: Secondary | ICD-10-CM

## 2014-06-14 DIAGNOSIS — C3492 Malignant neoplasm of unspecified part of left bronchus or lung: Secondary | ICD-10-CM

## 2014-06-14 DIAGNOSIS — C7B1 Secondary Merkel cell carcinoma: Secondary | ICD-10-CM

## 2014-06-14 LAB — COMPREHENSIVE METABOLIC PANEL (CC13)
ALK PHOS: 103 U/L (ref 40–150)
ALT: 29 U/L (ref 0–55)
AST: 23 U/L (ref 5–34)
Albumin: 3.9 g/dL (ref 3.5–5.0)
Anion Gap: 13 mEq/L — ABNORMAL HIGH (ref 3–11)
BILIRUBIN TOTAL: 0.37 mg/dL (ref 0.20–1.20)
BUN: 6.1 mg/dL — ABNORMAL LOW (ref 7.0–26.0)
CO2: 25 mEq/L (ref 22–29)
Calcium: 9.4 mg/dL (ref 8.4–10.4)
Chloride: 105 mEq/L (ref 98–109)
Creatinine: 0.7 mg/dL (ref 0.7–1.3)
Glucose: 170 mg/dl — ABNORMAL HIGH (ref 70–140)
Potassium: 3.8 mEq/L (ref 3.5–5.1)
SODIUM: 143 meq/L (ref 136–145)
Total Protein: 6.5 g/dL (ref 6.4–8.3)

## 2014-06-14 LAB — CBC WITH DIFFERENTIAL/PLATELET
BASO%: 0.7 % (ref 0.0–2.0)
Basophils Absolute: 0 10*3/uL (ref 0.0–0.1)
EOS%: 1.5 % (ref 0.0–7.0)
Eosinophils Absolute: 0.1 10*3/uL (ref 0.0–0.5)
HCT: 31.1 % — ABNORMAL LOW (ref 38.4–49.9)
HGB: 10.2 g/dL — ABNORMAL LOW (ref 13.0–17.1)
LYMPH#: 2.1 10*3/uL (ref 0.9–3.3)
LYMPH%: 29.7 % (ref 14.0–49.0)
MCH: 35.6 pg — AB (ref 27.2–33.4)
MCHC: 32.9 g/dL (ref 32.0–36.0)
MCV: 108.1 fL — ABNORMAL HIGH (ref 79.3–98.0)
MONO#: 0.6 10*3/uL (ref 0.1–0.9)
MONO%: 8.4 % (ref 0.0–14.0)
NEUT#: 4.2 10*3/uL (ref 1.5–6.5)
NEUT%: 59.7 % (ref 39.0–75.0)
Platelets: 79 10*3/uL — ABNORMAL LOW (ref 140–400)
RBC: 2.88 10*6/uL — ABNORMAL LOW (ref 4.20–5.82)
RDW: 16.9 % — AB (ref 11.0–14.6)
WBC: 7 10*3/uL (ref 4.0–10.3)

## 2014-06-19 ENCOUNTER — Other Ambulatory Visit: Payer: Self-pay | Admitting: Internal Medicine

## 2014-06-21 ENCOUNTER — Ambulatory Visit (HOSPITAL_BASED_OUTPATIENT_CLINIC_OR_DEPARTMENT_OTHER): Payer: Federal, State, Local not specified - PPO

## 2014-06-21 ENCOUNTER — Other Ambulatory Visit (HOSPITAL_BASED_OUTPATIENT_CLINIC_OR_DEPARTMENT_OTHER): Payer: Federal, State, Local not specified - PPO

## 2014-06-21 ENCOUNTER — Ambulatory Visit (HOSPITAL_BASED_OUTPATIENT_CLINIC_OR_DEPARTMENT_OTHER): Payer: Federal, State, Local not specified - PPO | Admitting: Physician Assistant

## 2014-06-21 ENCOUNTER — Encounter: Payer: Self-pay | Admitting: Physician Assistant

## 2014-06-21 VITALS — BP 161/79 | HR 80 | Temp 98.3°F | Resp 18 | Ht 73.0 in | Wt 237.4 lb

## 2014-06-21 DIAGNOSIS — C7A1 Malignant poorly differentiated neuroendocrine tumors: Secondary | ICD-10-CM

## 2014-06-21 DIAGNOSIS — Z5111 Encounter for antineoplastic chemotherapy: Secondary | ICD-10-CM

## 2014-06-21 DIAGNOSIS — C3492 Malignant neoplasm of unspecified part of left bronchus or lung: Secondary | ICD-10-CM

## 2014-06-21 DIAGNOSIS — C7B1 Secondary Merkel cell carcinoma: Secondary | ICD-10-CM

## 2014-06-21 LAB — CBC WITH DIFFERENTIAL/PLATELET
BASO%: 0.4 % (ref 0.0–2.0)
BASOS ABS: 0 10*3/uL (ref 0.0–0.1)
EOS%: 0.8 % (ref 0.0–7.0)
Eosinophils Absolute: 0.1 10*3/uL (ref 0.0–0.5)
HEMATOCRIT: 34.5 % — AB (ref 38.4–49.9)
HGB: 11.4 g/dL — ABNORMAL LOW (ref 13.0–17.1)
LYMPH#: 2.3 10*3/uL (ref 0.9–3.3)
LYMPH%: 32.6 % (ref 14.0–49.0)
MCH: 35.4 pg — ABNORMAL HIGH (ref 27.2–33.4)
MCHC: 32.9 g/dL (ref 32.0–36.0)
MCV: 107.5 fL — ABNORMAL HIGH (ref 79.3–98.0)
MONO#: 0.9 10*3/uL (ref 0.1–0.9)
MONO%: 12.5 % (ref 0.0–14.0)
NEUT#: 3.8 10*3/uL (ref 1.5–6.5)
NEUT%: 53.7 % (ref 39.0–75.0)
PLATELETS: 265 10*3/uL (ref 140–400)
RBC: 3.21 10*6/uL — ABNORMAL LOW (ref 4.20–5.82)
RDW: 16.2 % — ABNORMAL HIGH (ref 11.0–14.6)
WBC: 7.1 10*3/uL (ref 4.0–10.3)

## 2014-06-21 LAB — COMPREHENSIVE METABOLIC PANEL (CC13)
ALT: 28 U/L (ref 0–55)
ANION GAP: 12 meq/L — AB (ref 3–11)
AST: 25 U/L (ref 5–34)
Albumin: 4 g/dL (ref 3.5–5.0)
Alkaline Phosphatase: 79 U/L (ref 40–150)
BUN: 7.6 mg/dL (ref 7.0–26.0)
CALCIUM: 9.5 mg/dL (ref 8.4–10.4)
CHLORIDE: 107 meq/L (ref 98–109)
CO2: 25 meq/L (ref 22–29)
CREATININE: 0.7 mg/dL (ref 0.7–1.3)
GLUCOSE: 149 mg/dL — AB (ref 70–140)
Potassium: 3.5 mEq/L (ref 3.5–5.1)
Sodium: 145 mEq/L (ref 136–145)
Total Bilirubin: 0.36 mg/dL (ref 0.20–1.20)
Total Protein: 7 g/dL (ref 6.4–8.3)

## 2014-06-21 MED ORDER — SODIUM CHLORIDE 0.9 % IV SOLN
Freq: Once | INTRAVENOUS | Status: AC
  Administered 2014-06-21: 15:00:00 via INTRAVENOUS

## 2014-06-21 MED ORDER — SODIUM CHLORIDE 0.9 % IV SOLN
120.0000 mg/m2 | Freq: Once | INTRAVENOUS | Status: AC
  Administered 2014-06-21: 280 mg via INTRAVENOUS
  Filled 2014-06-21: qty 14

## 2014-06-21 MED ORDER — ONDANSETRON 16 MG/50ML IVPB (CHCC)
INTRAVENOUS | Status: AC
  Filled 2014-06-21: qty 16

## 2014-06-21 MED ORDER — DEXAMETHASONE SODIUM PHOSPHATE 20 MG/5ML IJ SOLN
20.0000 mg | Freq: Once | INTRAMUSCULAR | Status: AC
  Administered 2014-06-21: 20 mg via INTRAVENOUS

## 2014-06-21 MED ORDER — ONDANSETRON 16 MG/50ML IVPB (CHCC)
16.0000 mg | Freq: Once | INTRAVENOUS | Status: AC
  Administered 2014-06-21: 16 mg via INTRAVENOUS

## 2014-06-21 MED ORDER — SODIUM CHLORIDE 0.9 % IV SOLN
750.0000 mg | Freq: Once | INTRAVENOUS | Status: AC
  Administered 2014-06-21: 750 mg via INTRAVENOUS
  Filled 2014-06-21: qty 75

## 2014-06-21 MED ORDER — DEXAMETHASONE SODIUM PHOSPHATE 20 MG/5ML IJ SOLN
INTRAMUSCULAR | Status: AC
  Filled 2014-06-21: qty 5

## 2014-06-21 NOTE — Progress Notes (Signed)
[facility] Telephone:([phone]) [phone]   Fax:(336) [place], [place] [address] [place] [address]  DIAGNOSIS: extensive stage, Stage IV (T2a, N1, M1b) small cell lung cancer diagnosed in July of 2015.  PRIOR THERAPY: None  CURRENT THERAPY: systemic chemotherapy with carboplatin for AUC of 5 on day 1 and etoposide at 120 mg/M2 on days 1, 2 and 3 with Neulasta support on day 4. Status post 5 cycles.   INTERVAL HISTORY: Kerry Peck 53 y.o. male returns to the clinic today for follow up visit. The patient tolerated the second cycle of his treatment fairly well with no significant adverse effects. He denied having any significant fever or chills, no nausea or vomiting. He denied having any significant weight loss or night sweats. The patient has no significant chest pain but continues to have shortness of breath with exertion with no cough or hemoptysis. He  presents to proceed with cycle #6.  MEDICAL HISTORY: Past Medical History  Diagnosis Date  . Diabetes mellitus without complication   . Hypertension   . Smoking addiction   . Cancer     lung and liver    ALLERGIES:  has No Known Allergies.  MEDICATIONS:  Current Outpatient Prescriptions  Medication Sig Dispense Refill  . albuterol (PROVENTIL HFA;VENTOLIN HFA) 108 (90 BASE) MCG/ACT inhaler Inhale 2 puffs into the lungs every 6 (six) hours as needed for wheezing.  1 Inhaler  0  . amLODipine (NORVASC) 10 MG tablet Take 10 mg by mouth daily.      Marland Kitchen aspirin EC 81 MG tablet Take 81 mg by mouth daily.      Marland Kitchen atorvastatin (LIPITOR) 40 MG tablet Take 40 mg by mouth daily.      . calcium-vitamin D (OSCAL WITH D) 500-200 MG-UNIT per tablet Take 1 tablet by mouth.      . fenofibrate 160 MG tablet Take 160 mg by mouth daily.      . Fluticasone-Salmeterol (ADVAIR) 250-50 MCG/DOSE AEPB Inhale 1 puff into the lungs every 12 (twelve) hours.      Marland Kitchen glipiZIDE  (GLUCOTROL XL) 2.5 MG 24 hr tablet       . metFORMIN (GLUCOPHAGE) 500 MG tablet Take 1,000 mg by mouth 2 (two) times daily with a meal.       . metoprolol tartrate (LOPRESSOR) 25 MG tablet Take 25 mg by mouth daily.       . Multiple Vitamins-Minerals (CENTRUM SILVER ADULT 50+ PO) Take by mouth.      . ONE TOUCH ULTRA TEST test strip       . pantoprazole (PROTONIX) 40 MG tablet       . pyridOXINE (VITAMIN B-6) 100 MG tablet Take 100 mg by mouth daily.      . vitamin C (ASCORBIC ACID) 500 MG tablet Take 500 mg by mouth daily.      Marland Kitchen HYDROcodone-acetaminophen (NORCO/VICODIN) 5-325 MG per tablet Take 1 tablet by mouth every 6 (six) hours as needed for moderate pain.  15 tablet  0  . prochlorperazine (COMPAZINE) 10 MG tablet Take 1 tablet (10 mg total) by mouth every 6 (six) hours as needed for nausea or vomiting.  30 tablet  0   No current facility-administered medications for this visit.   Facility-Administered Medications Ordered in Other Visits  Medication Dose Route Frequency Provider Last Rate Last Dose  . etoposide (VEPESID) 280 mg in sodium chloride 0.9 % 700 mL  chemo infusion  120 mg/m2 (Treatment Plan Actual) Intravenous Once Curt Bears, MD 714 mL/hr at 06/21/14 1547 280 mg at 06/21/14 1547    REVIEW OF SYSTEMS:  Constitutional: positive for fatigue Eyes: negative Ears, nose, mouth, throat, and face: negative Respiratory: positive for dyspnea on exertion Cardiovascular: negative Gastrointestinal: negative Genitourinary:negative Integument/breast: negative Hematologic/lymphatic: negative Musculoskeletal:negative Neurological: negative Behavioral/Psych: negative Endocrine: negative Allergic/Immunologic: negative   PHYSICAL EXAMINATION: General appearance: alert, cooperative and no distress Head: Normocephalic, without obvious abnormality, atraumatic Neck: no adenopathy, no JVD, supple, symmetrical, trachea midline and thyroid not enlarged, symmetric, no  tenderness/mass/nodules Lymph nodes: Cervical, supraclavicular, and axillary nodes normal. Resp: clear to auscultation bilaterally Back: symmetric, no curvature. ROM normal. No CVA tenderness. Cardio: regular rate and rhythm, S1, S2 normal, no murmur, click, rub or gallop GI: soft, non-tender; bowel sounds normal; no masses,  no organomegaly Extremities: extremities normal, atraumatic, no cyanosis or edema Neurologic: Alert and oriented X 3, normal strength and tone. Normal symmetric reflexes. Normal coordination and gait  ECOG PERFORMANCE STATUS: 1 - Symptomatic but completely ambulatory  Blood pressure 161/79, pulse 80, temperature 98.3 F (36.8 C), temperature source Oral, resp. rate 18, height 6\' 1"  (1.854 m), weight 237 lb 6.4 oz (107.684 kg), SpO2 95.00%.  LABORATORY DATA: Lab Results  Component Value Date   WBC 7.1 06/21/2014   HGB 11.4* 06/21/2014   HCT 34.5* 06/21/2014   MCV 107.5* 06/21/2014   PLT 265 06/21/2014      Chemistry      Component Value Date/Time   NA 145 06/21/2014 1315   NA 136 04/05/2014 1338   K 3.5 06/21/2014 1315   K 3.9 04/05/2014 1338   CL 101 04/05/2014 1338   CO2 25 06/21/2014 1315   CO2 27 04/05/2014 1338   BUN 7.6 06/21/2014 1315   BUN 10 04/05/2014 1338   CREATININE 0.7 06/21/2014 1315   CREATININE 0.64 04/05/2014 1338      Component Value Date/Time   CALCIUM 9.5 06/21/2014 1315   CALCIUM 8.9 04/05/2014 1338   ALKPHOS 79 06/21/2014 1315   ALKPHOS 139* 04/05/2014 1338   AST 25 06/21/2014 1315   AST 13 04/05/2014 1338   ALT 28 06/21/2014 1315   ALT 17 04/05/2014 1338   BILITOT 0.36 06/21/2014 1315   BILITOT 0.5 04/05/2014 1338       RADIOGRAPHIC STUDIES: Ct Chest W Contrast  05/24/2014   CLINICAL DATA:  Lung cancer.  EXAM: CT CHEST, ABDOMEN, AND PELVIS WITH CONTRAST  TECHNIQUE: Multidetector CT imaging of the chest, abdomen and pelvis was performed following the standard protocol during bolus administration of intravenous contrast.   CONTRAST:  181mL OMNIPAQUE IOHEXOL 300 MG/ML  SOLN  COMPARISON:  04/15/2014.  FINDINGS: CT CHEST FINDINGS  Thoracic aorta is normal in caliber. No evidence of dissection or aneurysm. Pulmonary arteries are normal. Heart size normal. Coronary artery disease  No significant mediastinal or hilar lymph nodes are present. Previously identified left hilar 6 mm lymph node now measures 4 mm in diameter. Shotty left peritracheal lymph nodes are stable. Thoracic esophagus is unremarkable.  Large airways are patent. Previously defined left lower lobe 2.2x 2.7 cm mass has diminished in size and now measures 1.6 x 2.3 cm. No other significant pulmonary abnormalities identified. No pleural effusion or pneumothorax.  Visualized thyroid unremarkable. No supraclavicular or axillary lymph nodes.  CT ABDOMEN AND PELVIS FINDINGS  Diffuse fatty infiltration of the liver noted. No focal hepatic lesion identified. Spleen is normal. Pancreas is normal. No biliary  distention. Small gallstone. Previously identified hepatic duodenal 1.6 cm lymph node now measures 1.3 cm.  Adrenals normal. No focal renal abnormality. No hydronephrosis or obstructing ureteral stone. Bladder is nondistended. Mild bladder wall thickening is present.  No significant inguinal lymph nodes are present. No significant retroperitoneal lymph nodes. Abdominal aorta is atherosclerotic as are its visceral branches. Bilateral renal artery atherosclerotic vascular disease. No aneurysm. Prostate enlargement is stable.  Appendix is unremarkable. There is a focal area narrowing sigmoid colon, best demonstrated on image number 112/ series 2. This may be related to peristalsis. A focal apple-core lesion involving the rectum cannot be excluded. Direct visualization or barium enema suggested. There is no evidence of bowel obstruction. No bowel distention. Stomach is nondistended. No free air. No significant abdominal wall hernia. Tiny inguinal hernias are present with herniation of  fat only. Tiny umbilical hernia noted with herniation of fat only.  Lytic lesion in T7 is unchanged. Stable mild compression T7. Remaining lucencies in the spine are stable. Stable mild compressions of T12, L1, L3. Right femoral neck lucency is unchanged.  IMPRESSION: 1. Findings consistent with response tumor to treatment. Previously identified 2.2 x 2 .7 cm mass in the left lower lobe now measures 1.6 x 2.3 cm. Mediastinal/hilar adenopathy has improved. Largest lymph within in the left hilum measures only 4 mm in diameter. 2. Previously identified hepatic duodenal lymph node has diminished in size from 1.6 cm to 1.3 cm in diameter. 3. Focal narrowing and possible thickening of the rectum. Although this could be related to peristalsis focal rectal lesion including malignancy cannot be excluded and direct visualization or barium enema suggested for further evaluation. 4. Stable bony metastases .   Electronically Signed   By: Marcello Moores  Register   On: 05/24/2014 15:46   Ct Abdomen Pelvis W Contrast  05/24/2014   CLINICAL DATA:  Lung cancer.  EXAM: CT CHEST, ABDOMEN, AND PELVIS WITH CONTRAST  TECHNIQUE: Multidetector CT imaging of the chest, abdomen and pelvis was performed following the standard protocol during bolus administration of intravenous contrast.  CONTRAST:  161mL OMNIPAQUE IOHEXOL 300 MG/ML  SOLN  COMPARISON:  04/15/2014.  FINDINGS: CT CHEST FINDINGS  Thoracic aorta is normal in caliber. No evidence of dissection or aneurysm. Pulmonary arteries are normal. Heart size normal. Coronary artery disease  No significant mediastinal or hilar lymph nodes are present. Previously identified left hilar 6 mm lymph node now measures 4 mm in diameter. Shotty left peritracheal lymph nodes are stable. Thoracic esophagus is unremarkable.  Large airways are patent. Previously defined left lower lobe 2.2x 2.7 cm mass has diminished in size and now measures 1.6 x 2.3 cm. No other significant pulmonary abnormalities  identified. No pleural effusion or pneumothorax.  Visualized thyroid unremarkable. No supraclavicular or axillary lymph nodes.  CT ABDOMEN AND PELVIS FINDINGS  Diffuse fatty infiltration of the liver noted. No focal hepatic lesion identified. Spleen is normal. Pancreas is normal. No biliary distention. Small gallstone. Previously identified hepatic duodenal 1.6 cm lymph node now measures 1.3 cm.  Adrenals normal. No focal renal abnormality. No hydronephrosis or obstructing ureteral stone. Bladder is nondistended. Mild bladder wall thickening is present.  No significant inguinal lymph nodes are present. No significant retroperitoneal lymph nodes. Abdominal aorta is atherosclerotic as are its visceral branches. Bilateral renal artery atherosclerotic vascular disease. No aneurysm. Prostate enlargement is stable.  Appendix is unremarkable. There is a focal area narrowing sigmoid colon, best demonstrated on image number 112/ series 2. This may be related to peristalsis.  A focal apple-core lesion involving the rectum cannot be excluded. Direct visualization or barium enema suggested. There is no evidence of bowel obstruction. No bowel distention. Stomach is nondistended. No free air. No significant abdominal wall hernia. Tiny inguinal hernias are present with herniation of fat only. Tiny umbilical hernia noted with herniation of fat only.  Lytic lesion in T7 is unchanged. Stable mild compression T7. Remaining lucencies in the spine are stable. Stable mild compressions of T12, L1, L3. Right femoral neck lucency is unchanged.  IMPRESSION: 1. Findings consistent with response tumor to treatment. Previously identified 2.2 x 2 .7 cm mass in the left lower lobe now measures 1.6 x 2.3 cm. Mediastinal/hilar adenopathy has improved. Largest lymph within in the left hilum measures only 4 mm in diameter. 2. Previously identified hepatic duodenal lymph node has diminished in size from 1.6 cm to 1.3 cm in diameter. 3. Focal narrowing  and possible thickening of the rectum. Although this could be related to peristalsis focal rectal lesion including malignancy cannot be excluded and direct visualization or barium enema suggested for further evaluation. 4. Stable bony metastases .   Electronically Signed   By: Marcello Moores  Register   On: 05/24/2014 15:46   ASSESSMENT AND PLAN: this is a very pleasant 53 years old white male recently diagnosed with extensive stage small cell lung cancer status post 2 cycles of systemic chemotherapy with carboplatin and etoposide. Status post 4 cycles. Overall he is tolerating his chemotherapy without difficulty. His last CT scan revealed continued response to treatment with decreased tumor size. There was some focal narrowing and possible thickening of the rectum. Although this could be related to peristalsis focal rectal lesion including malignancy could not be excluded and direct visualization or barium enema most suggestive for further evaluation.  He wll proceed with cycle #6 today as scheduled. We will watch the area of questionable rectal fullness closely on subsequent restaging CT scan. He'll continue with weekly labs as scheduled. He'll follow-up in 3 weeks with a restaging CT scan of the chest, abdomen and pelvis with contrast to reevaluate his disease. a The patient was advised to call immediately if he has any concerning symptoms in the interval.  The patient voices understanding of current disease status and treatment options and is in agreement with the current care plan.  All questions were answered. The patient knows to call the clinic with any problems, questions or concerns. We can certainly see the patient much sooner if necessary.  Carlton Adam, PA-C 06/21/2014  This note was dictated with voice recognition software. Similar sounding words can inadvertently be transcribed and may not be corrected upon review.

## 2014-06-21 NOTE — Patient Instructions (Signed)
[facility] Discharge Instructions for Patients Receiving Chemotherapy  Today you received the following chemotherapy agents Etoposide and Carboplatin.  To help prevent nausea and vomiting after your treatment, we encourage you to take your nausea medication Compazine 10 mg every 6 hours as needed.  If you develop nausea and vomiting that is not controlled by your nausea medication, call the clinic.   BELOW ARE SYMPTOMS THAT SHOULD BE REPORTED IMMEDIATELY:  *FEVER GREATER THAN 100.5 F  *CHILLS WITH OR WITHOUT FEVER  NAUSEA AND VOMITING THAT IS NOT CONTROLLED WITH YOUR NAUSEA MEDICATION  *UNUSUAL SHORTNESS OF BREATH  *UNUSUAL BRUISING OR BLEEDING  TENDERNESS IN MOUTH AND THROAT WITH OR WITHOUT PRESENCE OF ULCERS  *URINARY PROBLEMS  *BOWEL PROBLEMS  UNUSUAL RASH Items with * indicate a potential emergency and should be followed up as soon as possible.  Feel free to call the clinic you have any questions or concerns. The clinic phone number is [phone].

## 2014-06-22 ENCOUNTER — Ambulatory Visit (HOSPITAL_BASED_OUTPATIENT_CLINIC_OR_DEPARTMENT_OTHER): Payer: Federal, State, Local not specified - PPO

## 2014-06-22 ENCOUNTER — Telehealth: Payer: Self-pay | Admitting: Physician Assistant

## 2014-06-22 ENCOUNTER — Telehealth: Payer: Self-pay | Admitting: *Deleted

## 2014-06-22 VITALS — BP 128/74 | HR 79 | Temp 98.6°F | Resp 18

## 2014-06-22 DIAGNOSIS — C7B8 Other secondary neuroendocrine tumors: Secondary | ICD-10-CM

## 2014-06-22 DIAGNOSIS — C3492 Malignant neoplasm of unspecified part of left bronchus or lung: Secondary | ICD-10-CM

## 2014-06-22 DIAGNOSIS — Z5111 Encounter for antineoplastic chemotherapy: Secondary | ICD-10-CM

## 2014-06-22 DIAGNOSIS — C7A1 Malignant poorly differentiated neuroendocrine tumors: Secondary | ICD-10-CM

## 2014-06-22 MED ORDER — SODIUM CHLORIDE 0.9 % IV SOLN
120.0000 mg/m2 | Freq: Once | INTRAVENOUS | Status: AC
  Administered 2014-06-22: 280 mg via INTRAVENOUS
  Filled 2014-06-22: qty 14

## 2014-06-22 MED ORDER — SODIUM CHLORIDE 0.9 % IV SOLN
Freq: Once | INTRAVENOUS | Status: AC
  Administered 2014-06-22: 13:00:00 via INTRAVENOUS

## 2014-06-22 MED ORDER — ONDANSETRON 8 MG/NS 50 ML IVPB
INTRAVENOUS | Status: AC
  Filled 2014-06-22: qty 8

## 2014-06-22 MED ORDER — ONDANSETRON 8 MG/50ML IVPB (CHCC)
8.0000 mg | Freq: Once | INTRAVENOUS | Status: AC
  Administered 2014-06-22: 8 mg via INTRAVENOUS

## 2014-06-22 MED ORDER — DEXAMETHASONE SODIUM PHOSPHATE 10 MG/ML IJ SOLN
10.0000 mg | Freq: Once | INTRAMUSCULAR | Status: AC
  Administered 2014-06-22: 10 mg via INTRAVENOUS

## 2014-06-22 MED ORDER — DEXAMETHASONE SODIUM PHOSPHATE 10 MG/ML IJ SOLN
INTRAMUSCULAR | Status: AC
  Filled 2014-06-22: qty 1

## 2014-06-22 NOTE — Patient Instructions (Signed)
[facility] Discharge Instructions for Patients Receiving Chemotherapy  Today you received the following chemotherapy agents: Etoposide.  To help prevent nausea and vomiting after your treatment, we encourage you to take your nausea medication as prescribed.   If you develop nausea and vomiting that is not controlled by your nausea medication, call the clinic.   BELOW ARE SYMPTOMS THAT SHOULD BE REPORTED IMMEDIATELY:  *FEVER GREATER THAN 100.5 F  *CHILLS WITH OR WITHOUT FEVER  NAUSEA AND VOMITING THAT IS NOT CONTROLLED WITH YOUR NAUSEA MEDICATION  *UNUSUAL SHORTNESS OF BREATH  *UNUSUAL BRUISING OR BLEEDING  TENDERNESS IN MOUTH AND THROAT WITH OR WITHOUT PRESENCE OF ULCERS  *URINARY PROBLEMS  *BOWEL PROBLEMS  UNUSUAL RASH Items with * indicate a potential emergency and should be followed up as soon as possible.  Feel free to call the clinic you have any questions or concerns. The clinic phone number is [phone].

## 2014-06-22 NOTE — Telephone Encounter (Signed)
Lab/Dr visit sch by pof 06/21/14. Please give new sch at appt 06/22/14.

## 2014-06-22 NOTE — Telephone Encounter (Signed)
Per staff message and POF I have scheduled appts. Advised scheduler of appts. JMW  

## 2014-06-23 ENCOUNTER — Ambulatory Visit (HOSPITAL_BASED_OUTPATIENT_CLINIC_OR_DEPARTMENT_OTHER): Payer: Federal, State, Local not specified - PPO

## 2014-06-23 VITALS — BP 141/69 | HR 78 | Temp 98.2°F | Resp 16

## 2014-06-23 DIAGNOSIS — C7A1 Malignant poorly differentiated neuroendocrine tumors: Secondary | ICD-10-CM

## 2014-06-23 DIAGNOSIS — C7B8 Other secondary neuroendocrine tumors: Secondary | ICD-10-CM

## 2014-06-23 DIAGNOSIS — Z5111 Encounter for antineoplastic chemotherapy: Secondary | ICD-10-CM

## 2014-06-23 DIAGNOSIS — C3492 Malignant neoplasm of unspecified part of left bronchus or lung: Secondary | ICD-10-CM

## 2014-06-23 MED ORDER — SODIUM CHLORIDE 0.9 % IV SOLN
Freq: Once | INTRAVENOUS | Status: AC
  Administered 2014-06-23: 13:00:00 via INTRAVENOUS

## 2014-06-23 MED ORDER — DEXAMETHASONE SODIUM PHOSPHATE 10 MG/ML IJ SOLN
10.0000 mg | Freq: Once | INTRAMUSCULAR | Status: AC
  Administered 2014-06-23: 10 mg via INTRAVENOUS

## 2014-06-23 MED ORDER — DEXAMETHASONE SODIUM PHOSPHATE 10 MG/ML IJ SOLN
INTRAMUSCULAR | Status: AC
  Filled 2014-06-23: qty 1

## 2014-06-23 MED ORDER — SODIUM CHLORIDE 0.9 % IV SOLN
120.0000 mg/m2 | Freq: Once | INTRAVENOUS | Status: AC
  Administered 2014-06-23: 280 mg via INTRAVENOUS
  Filled 2014-06-23: qty 14

## 2014-06-23 MED ORDER — ONDANSETRON 8 MG/NS 50 ML IVPB
INTRAVENOUS | Status: AC
  Filled 2014-06-23: qty 8

## 2014-06-23 MED ORDER — ONDANSETRON 8 MG/50ML IVPB (CHCC)
8.0000 mg | Freq: Once | INTRAVENOUS | Status: AC
  Administered 2014-06-23: 8 mg via INTRAVENOUS

## 2014-06-23 NOTE — Patient Instructions (Signed)
[facility] Discharge Instructions for Patients Receiving Chemotherapy  Today you received the following chemotherapy agents Etoposide.  To help prevent nausea and vomiting after your treatment, we encourage you to take your nausea medication as prescribed.   If you develop nausea and vomiting that is not controlled by your nausea medication, call the clinic.   BELOW ARE SYMPTOMS THAT SHOULD BE REPORTED IMMEDIATELY:  *FEVER GREATER THAN 100.5 F  *CHILLS WITH OR WITHOUT FEVER  NAUSEA AND VOMITING THAT IS NOT CONTROLLED WITH YOUR NAUSEA MEDICATION  *UNUSUAL SHORTNESS OF BREATH  *UNUSUAL BRUISING OR BLEEDING  TENDERNESS IN MOUTH AND THROAT WITH OR WITHOUT PRESENCE OF ULCERS  *URINARY PROBLEMS  *BOWEL PROBLEMS  UNUSUAL RASH Items with * indicate a potential emergency and should be followed up as soon as possible.  Feel free to call the clinic you have any questions or concerns. The clinic phone number is [phone].

## 2014-06-24 ENCOUNTER — Ambulatory Visit (HOSPITAL_BASED_OUTPATIENT_CLINIC_OR_DEPARTMENT_OTHER): Payer: Federal, State, Local not specified - PPO

## 2014-06-24 VITALS — BP 124/75 | HR 66 | Temp 97.7°F | Resp 16

## 2014-06-24 DIAGNOSIS — C3492 Malignant neoplasm of unspecified part of left bronchus or lung: Secondary | ICD-10-CM

## 2014-06-24 DIAGNOSIS — Z5189 Encounter for other specified aftercare: Secondary | ICD-10-CM

## 2014-06-24 DIAGNOSIS — C7A1 Malignant poorly differentiated neuroendocrine tumors: Secondary | ICD-10-CM

## 2014-06-24 DIAGNOSIS — C7B8 Other secondary neuroendocrine tumors: Secondary | ICD-10-CM

## 2014-06-24 MED ORDER — PEGFILGRASTIM INJECTION 6 MG/0.6ML
6.0000 mg | Freq: Once | SUBCUTANEOUS | Status: AC
  Administered 2014-06-24: 6 mg via SUBCUTANEOUS
  Filled 2014-06-24: qty 0.6

## 2014-06-24 NOTE — Patient Instructions (Signed)
Continue Lasix and chemotherapy is scheduled  Follow-up in 3 weeks with restaging CT scan of her chest, abdomen and pelvis to reevaluate your disease

## 2014-07-07 ENCOUNTER — Encounter (HOSPITAL_COMMUNITY): Payer: Self-pay

## 2014-07-07 ENCOUNTER — Ambulatory Visit (HOSPITAL_COMMUNITY)
Admission: RE | Admit: 2014-07-07 | Discharge: 2014-07-07 | Disposition: A | Discharge status: Home / Self Care | Payer: Federal, State, Local not specified - PPO | Source: Ambulatory Visit | Attending: Physician Assistant | Admitting: Physician Assistant

## 2014-07-07 DIAGNOSIS — C3492 Malignant neoplasm of unspecified part of left bronchus or lung: Secondary | ICD-10-CM

## 2014-07-07 DIAGNOSIS — C7951 Secondary malignant neoplasm of bone: Secondary | ICD-10-CM | POA: Diagnosis not present

## 2014-07-07 DIAGNOSIS — K8020 Calculus of gallbladder without cholecystitis without obstruction: Secondary | ICD-10-CM | POA: Diagnosis not present

## 2014-07-07 DIAGNOSIS — C3490 Malignant neoplasm of unspecified part of unspecified bronchus or lung: Secondary | ICD-10-CM | POA: Diagnosis not present

## 2014-07-07 DIAGNOSIS — I700 Atherosclerosis of aorta: Secondary | ICD-10-CM | POA: Diagnosis not present

## 2014-07-07 DIAGNOSIS — C787 Secondary malignant neoplasm of liver and intrahepatic bile duct: Secondary | ICD-10-CM | POA: Diagnosis present

## 2014-07-07 DIAGNOSIS — K760 Fatty (change of) liver, not elsewhere classified: Secondary | ICD-10-CM | POA: Insufficient documentation

## 2014-07-07 DIAGNOSIS — I2510 Atherosclerotic heart disease of native coronary artery without angina pectoris: Secondary | ICD-10-CM | POA: Insufficient documentation

## 2014-07-07 MED ORDER — IOHEXOL 300 MG/ML  SOLN
100.0000 mL | Freq: Once | INTRAMUSCULAR | Status: AC | PRN
  Administered 2014-07-07: 100 mL via INTRAVENOUS

## 2014-07-08 ENCOUNTER — Ambulatory Visit (HOSPITAL_COMMUNITY): Payer: Federal, State, Local not specified - PPO

## 2014-07-12 ENCOUNTER — Ambulatory Visit: Payer: Federal, State, Local not specified - PPO

## 2014-07-12 ENCOUNTER — Other Ambulatory Visit: Payer: Federal, State, Local not specified - PPO

## 2014-07-12 ENCOUNTER — Other Ambulatory Visit (HOSPITAL_BASED_OUTPATIENT_CLINIC_OR_DEPARTMENT_OTHER): Payer: Federal, State, Local not specified - PPO

## 2014-07-12 ENCOUNTER — Ambulatory Visit (HOSPITAL_BASED_OUTPATIENT_CLINIC_OR_DEPARTMENT_OTHER): Payer: Federal, State, Local not specified - PPO | Admitting: Physician Assistant

## 2014-07-12 ENCOUNTER — Encounter: Payer: Self-pay | Admitting: Physician Assistant

## 2014-07-12 ENCOUNTER — Ambulatory Visit: Payer: Federal, State, Local not specified - PPO | Admitting: Internal Medicine

## 2014-07-12 ENCOUNTER — Telehealth: Payer: Self-pay | Admitting: Physician Assistant

## 2014-07-12 VITALS — BP 139/71 | HR 84 | Temp 98.7°F | Resp 18 | Ht 73.0 in | Wt 238.3 lb

## 2014-07-12 DIAGNOSIS — C3492 Malignant neoplasm of unspecified part of left bronchus or lung: Secondary | ICD-10-CM

## 2014-07-12 DIAGNOSIS — C7A1 Malignant poorly differentiated neuroendocrine tumors: Secondary | ICD-10-CM

## 2014-07-12 DIAGNOSIS — C7B8 Other secondary neuroendocrine tumors: Secondary | ICD-10-CM

## 2014-07-12 LAB — CBC WITH DIFFERENTIAL/PLATELET
BASO%: 0.8 % (ref 0.0–2.0)
Basophils Absolute: 0 10*3/uL (ref 0.0–0.1)
EOS%: 1.2 % (ref 0.0–7.0)
Eosinophils Absolute: 0.1 10*3/uL (ref 0.0–0.5)
HCT: 33.5 % — ABNORMAL LOW (ref 38.4–49.9)
HGB: 11.3 g/dL — ABNORMAL LOW (ref 13.0–17.1)
LYMPH#: 2 10*3/uL (ref 0.9–3.3)
LYMPH%: 38.9 % (ref 14.0–49.0)
MCH: 35.9 pg — ABNORMAL HIGH (ref 27.2–33.4)
MCHC: 33.8 g/dL (ref 32.0–36.0)
MCV: 106.2 fL — AB (ref 79.3–98.0)
MONO#: 0.8 10*3/uL (ref 0.1–0.9)
MONO%: 15 % — AB (ref 0.0–14.0)
NEUT#: 2.3 10*3/uL (ref 1.5–6.5)
NEUT%: 44.1 % (ref 39.0–75.0)
Platelets: 234 10*3/uL (ref 140–400)
RBC: 3.15 10*6/uL — ABNORMAL LOW (ref 4.20–5.82)
RDW: 16.4 % — ABNORMAL HIGH (ref 11.0–14.6)
WBC: 5.2 10*3/uL (ref 4.0–10.3)

## 2014-07-12 LAB — COMPREHENSIVE METABOLIC PANEL (CC13)
ALK PHOS: 92 U/L (ref 40–150)
ALT: 29 U/L (ref 0–55)
AST: 27 U/L (ref 5–34)
Albumin: 4.1 g/dL (ref 3.5–5.0)
Anion Gap: 11 mEq/L (ref 3–11)
BILIRUBIN TOTAL: 0.4 mg/dL (ref 0.20–1.20)
BUN: 6.9 mg/dL — ABNORMAL LOW (ref 7.0–26.0)
CALCIUM: 9.4 mg/dL (ref 8.4–10.4)
CHLORIDE: 104 meq/L (ref 98–109)
CO2: 26 mEq/L (ref 22–29)
CREATININE: 0.8 mg/dL (ref 0.7–1.3)
Glucose: 203 mg/dl — ABNORMAL HIGH (ref 70–140)
Potassium: 3.5 mEq/L (ref 3.5–5.1)
Sodium: 141 mEq/L (ref 136–145)
Total Protein: 6.9 g/dL (ref 6.4–8.3)

## 2014-07-12 NOTE — Progress Notes (Addendum)
[facility] [phone]   Fax:([phone]) [place], MD [address] [place] [id number]  DIAGNOSIS: extensive stage, Stage IV (T2a, N1, M1b) small cell lung cancer diagnosed in July of 2015.  PRIOR THERAPY: None  CURRENT THERAPY: systemic chemotherapy with carboplatin for AUC of 5 on day 1 and etoposide at 120 mg/M2 on days 1, 2 and 3 with Neulasta support on day 4. Status post 6 cycles.   INTERVAL HISTORY: Kerry Peck 53 y.o. male returns to the clinic today for follow up visit. Overall he tolerated his course of chemotherapy without significant difficulty. He denies any cough, shortness of breath or hemoptysis. He's had no significant weight loss or night sweats. Denies any diarrhea or constipation, nausea or vomiting. He recently had a restaging CT scan of the chest, abdomen and pelvis and presents to discuss the results.   MEDICAL HISTORY: Past Medical History  Diagnosis Date  . Diabetes mellitus without complication   . Hypertension   . Smoking addiction   . Cancer     lung and liver    ALLERGIES:  has No Known Allergies.  MEDICATIONS:  Current Outpatient Prescriptions  Medication Sig Dispense Refill  . albuterol (PROVENTIL HFA;VENTOLIN HFA) 108 (90 BASE) MCG/ACT inhaler Inhale 2 puffs into the lungs every 6 (six) hours as needed for wheezing. 1 Inhaler 0  . amLODipine (NORVASC) 10 MG tablet Take 10 mg by mouth daily.    Marland Kitchen aspirin EC 81 MG tablet Take 81 mg by mouth daily.    Marland Kitchen atorvastatin (LIPITOR) 40 MG tablet Take 40 mg by mouth daily.    . calcium-vitamin D (OSCAL WITH D) 500-200 MG-UNIT per tablet Take 1 tablet by mouth.    . fenofibrate 160 MG tablet Take 160 mg by mouth daily.    . Fluticasone-Salmeterol (ADVAIR) 250-50 MCG/DOSE AEPB Inhale 1 puff into the lungs every 12 (twelve) hours.    Marland Kitchen glipiZIDE (GLUCOTROL XL) 2.5 MG 24 hr tablet     . HYDROcodone-acetaminophen  (NORCO/VICODIN) 5-325 MG per tablet Take 1 tablet by mouth every 6 (six) hours as needed for moderate pain. 15 tablet 0  . metFORMIN (GLUCOPHAGE) 500 MG tablet Take 1,000 mg by mouth 2 (two) times daily with a meal.     . metoprolol tartrate (LOPRESSOR) 25 MG tablet Take 25 mg by mouth daily.     . Multiple Vitamins-Minerals (CENTRUM SILVER ADULT 50+ PO) Take by mouth.    . ONE TOUCH ULTRA TEST test strip     . pantoprazole (PROTONIX) 40 MG tablet     . prochlorperazine (COMPAZINE) 10 MG tablet Take 1 tablet (10 mg total) by mouth every 6 (six) hours as needed for nausea or vomiting. 30 tablet 0  . pyridOXINE (VITAMIN B-6) 100 MG tablet Take 100 mg by mouth daily.    . vitamin C (ASCORBIC ACID) 500 MG tablet Take 500 mg by mouth daily.     No current facility-administered medications for this visit.    REVIEW OF SYSTEMS:  Constitutional: positive for fatigue Eyes: negative Ears, nose, mouth, throat, and face: negative Respiratory: positive for dyspnea on exertion Cardiovascular: negative Gastrointestinal: negative Genitourinary:negative Integument/breast: negative Hematologic/lymphatic: negative Musculoskeletal:negative Neurological: negative Behavioral/Psych: negative Endocrine: negative Allergic/Immunologic: negative   PHYSICAL EXAMINATION: General appearance: alert, cooperative and no distress Head: Normocephalic, without obvious abnormality, atraumatic Neck: no adenopathy, no JVD, supple, symmetrical, trachea midline and thyroid not enlarged, symmetric, no  tenderness/mass/nodules Lymph nodes: Cervical, supraclavicular, and axillary nodes normal. Resp: clear to auscultation bilaterally Back: symmetric, no curvature. ROM normal. No CVA tenderness. Cardio: regular rate and rhythm, S1, S2 normal, no murmur, click, rub or gallop GI: soft, non-tender; bowel sounds normal; no masses,  no organomegaly Extremities: extremities normal, atraumatic, no cyanosis or edema Neurologic: Alert  and oriented X 3, normal strength and tone. Normal symmetric reflexes. Normal coordination and gait  ECOG PERFORMANCE STATUS: 1 - Symptomatic but completely ambulatory  Blood pressure 139/71, pulse 84, temperature 98.7 F (37.1 C), temperature source Oral, resp. rate 18, height 6\' 1"  (1.854 m), weight 238 lb 4.8 oz (108.092 kg), SpO2 100 %.  LABORATORY DATA: Lab Results  Component Value Date   WBC 5.2 07/12/2014   HGB 11.3* 07/12/2014   HCT 33.5* 07/12/2014   MCV 106.2* 07/12/2014   PLT 234 07/12/2014      Chemistry      Component Value Date/Time   NA 141 07/12/2014 1319   NA 136 04/05/2014 1338   K 3.5 07/12/2014 1319   K 3.9 04/05/2014 1338   CL 101 04/05/2014 1338   CO2 26 07/12/2014 1319   CO2 27 04/05/2014 1338   BUN 6.9* 07/12/2014 1319   BUN 10 04/05/2014 1338   CREATININE 0.8 07/12/2014 1319   CREATININE 0.64 04/05/2014 1338      Component Value Date/Time   CALCIUM 9.4 07/12/2014 1319   CALCIUM 8.9 04/05/2014 1338   ALKPHOS 92 07/12/2014 1319   ALKPHOS 139* 04/05/2014 1338   AST 27 07/12/2014 1319   AST 13 04/05/2014 1338   ALT 29 07/12/2014 1319   ALT 17 04/05/2014 1338   BILITOT 0.40 07/12/2014 1319   BILITOT 0.5 04/05/2014 1338       RADIOGRAPHIC STUDIES: Ct Chest W Contrast  07/07/2014   CLINICAL DATA:  Restaging extensive stage small-cell lung cancer, with liver metastases, chemotherapy in progress.  EXAM: CT CHEST, ABDOMEN, AND PELVIS WITH CONTRAST  TECHNIQUE: Multidetector CT imaging of the chest, abdomen and pelvis was performed following the standard protocol during bolus administration of intravenous contrast.  CONTRAST:  153mL OMNIPAQUE IOHEXOL 300 MG/ML  SOLN  COMPARISON:  05/24/2014  FINDINGS: CT CHEST FINDINGS  1.3 x 2.2 cm left lower lobe nodule (series 5/ image 38), corresponding to known primary bronchogenic neoplasm, previously 1.6 x 2.3 cm.  Lungs are otherwise clear.  No pleural effusion or pneumothorax.  Visualized thyroid is  unremarkable.  The heart is normal in size. No pericardial effusion. Coronary atherosclerosis. Mild atherosclerotic calcifications of the aortic arch.  No suspicious mediastinal, hilar, or axillary lymphadenopathy. 8 mm short axis right paratracheal node with preservation of the normal fatty hilum (series 2/ image 23).  Multifocal osseous metastases in the visualized thoracic spine, including a dominant lytic metastasis at T7 with associated mild pathologic fracture (sagittal image 72), unchanged. Known left scapular metastasis is not evident on unenhanced CT.  CT ABDOMEN AND PELVIS FINDINGS  Hepatobiliary: Liver is notable for hepatic steatosis. Prior hepatic metastasis is no longer evident on CT.  Layering gallstone (series 2/ image 70), without associated inflammatory changes.  Pancreas: Within normal limits.  Spleen: Within normal limits.  Adrenals/Urinary Tract: Adrenal glands are unremarkable.  Kidneys are within normal limits.  No hydronephrosis.  Bladder is within normal limits.  Stomach/Bowel: Stomach is unremarkable.  No evidence of bowel obstruction.  Normal appendix.  Vascular/Lymphatic: Atherosclerotic calcifications of the abdominal aorta and branch vessels.  Small upper abdominal lymph nodes including peripancreatic,  periportal, and portacaval nodes measuring 8-9 mm short axis (series 2/ images 66 and 7), previously 12-13 mm.  Reproductive: Prostate is unremarkable.  Other: No abdominopelvic ascites.  Musculoskeletal: Mild to moderate loss of height at L1 and L3. Stable sclerotic metastasis at S tube. Sclerotic metastases involving the left iliac bone (series 2/images 103 and 110).  IMPRESSION: 2.2 cm left lower lobe pulmonary nodule, corresponding to known primary bronchogenic neoplasm, mildly decreased.  Small upper abdominal lymph nodes, mildly decreased.  Stable multifocal osseous metastases.   Electronically Signed   By: Julian Hy M.D.   On: 07/07/2014 15:14   Ct Abdomen Pelvis W  Contrast  07/07/2014   CLINICAL DATA:  Restaging extensive stage small-cell lung cancer, with liver metastases, chemotherapy in progress.  EXAM: CT CHEST, ABDOMEN, AND PELVIS WITH CONTRAST  TECHNIQUE: Multidetector CT imaging of the chest, abdomen and pelvis was performed following the standard protocol during bolus administration of intravenous contrast.  CONTRAST:  182mL OMNIPAQUE IOHEXOL 300 MG/ML  SOLN  COMPARISON:  05/24/2014  FINDINGS: CT CHEST FINDINGS  1.3 x 2.2 cm left lower lobe nodule (series 5/ image 38), corresponding to known primary bronchogenic neoplasm, previously 1.6 x 2.3 cm.  Lungs are otherwise clear.  No pleural effusion or pneumothorax.  Visualized thyroid is unremarkable.  The heart is normal in size. No pericardial effusion. Coronary atherosclerosis. Mild atherosclerotic calcifications of the aortic arch.  No suspicious mediastinal, hilar, or axillary lymphadenopathy. 8 mm short axis right paratracheal node with preservation of the normal fatty hilum (series 2/ image 23).  Multifocal osseous metastases in the visualized thoracic spine, including a dominant lytic metastasis at T7 with associated mild pathologic fracture (sagittal image 72), unchanged. Known left scapular metastasis is not evident on unenhanced CT.  CT ABDOMEN AND PELVIS FINDINGS  Hepatobiliary: Liver is notable for hepatic steatosis. Prior hepatic metastasis is no longer evident on CT.  Layering gallstone (series 2/ image 70), without associated inflammatory changes.  Pancreas: Within normal limits.  Spleen: Within normal limits.  Adrenals/Urinary Tract: Adrenal glands are unremarkable.  Kidneys are within normal limits.  No hydronephrosis.  Bladder is within normal limits.  Stomach/Bowel: Stomach is unremarkable.  No evidence of bowel obstruction.  Normal appendix.  Vascular/Lymphatic: Atherosclerotic calcifications of the abdominal aorta and branch vessels.  Small upper abdominal lymph nodes including peripancreatic,  periportal, and portacaval nodes measuring 8-9 mm short axis (series 2/ images 66 and 7), previously 12-13 mm.  Reproductive: Prostate is unremarkable.  Other: No abdominopelvic ascites.  Musculoskeletal: Mild to moderate loss of height at L1 and L3. Stable sclerotic metastasis at S tube. Sclerotic metastases involving the left iliac bone (series 2/images 103 and 110).  IMPRESSION: 2.2 cm left lower lobe pulmonary nodule, corresponding to known primary bronchogenic neoplasm, mildly decreased.  Small upper abdominal lymph nodes, mildly decreased.  Stable multifocal osseous metastases.   Electronically Signed   By: Julian Hy M.D.   On: 07/07/2014 15:14    ASSESSMENT AND PLAN: this is a very pleasant 53 years old white male recently diagnosed with extensive stage small cell lung cancer status post 6 cycles of systemic chemotherapy with carboplatin and etoposide with Neulasta support.  Overall he tolerated his chemotherapy without difficulty. His most recent CT scan revealed mild decrease in the left lower lobe pulmonary nodule as well as mild decrease in the small upper abdominal lymph nodes. The multifocal osseous metastasis was stable. Patient was discussed with and also seen by Dr. Julien Nordmann. He will be referred  to Dr. Sondra Come for prophylactic cranial radiation. We'll plan to see the patient back in 2 months for a follow-up visit with a restaging CT scan of the chest, abdomen and pelvis to review evaluate his disease.   The patient was advised to call immediately if he has any concerning symptoms in the interval.  The patient voices understanding of current disease status and treatment options and is in agreement with the current care plan.  All questions were answered. The patient knows to call the clinic with any problems, questions or concerns. We can certainly see the patient much sooner if necessary.  Carlton Adam, PA-C 07/12/2014  This note was dictated with voice recognition software.  Similar sounding words can inadvertently be transcribed and may not be corrected upon review.  ADDENDUM: Hematology/Oncology Attending: I had a face to face encounter with the patient. I recommended his care plan. This is a very pleasant 53 years old white male with extensive stage small cell lung cancer status post 6 cycles of systemic chemotherapy with carboplatin and etoposide. The patient has significant improvement in his disease after cycle #6. I discussed the scan results with the patient. I recommended for him to continue on observation with repeat CT scan of the chest, abdomen and pelvis in 2 months for restaging of his disease. I would also referred the patient to radiation oncology for consideration of prophylactic cranial irradiation as well as palliative radiotherapy to the residual disease in the chest. The patient agreed to the current plan. He was advised to call immediately if he has any concerning symptoms in the interval.  Disclaimer: This note was dictated with voice recognition software. Similar sounding words can inadvertently be transcribed and may be missed upon review. [name], MD 07/16/2014

## 2014-07-12 NOTE — Telephone Encounter (Signed)
Mailed cal for Jan/Feb.

## 2014-07-13 ENCOUNTER — Ambulatory Visit: Payer: Federal, State, Local not specified - PPO

## 2014-07-14 ENCOUNTER — Ambulatory Visit: Payer: Federal, State, Local not specified - PPO

## 2014-07-15 ENCOUNTER — Ambulatory Visit: Payer: Federal, State, Local not specified - PPO

## 2014-07-15 NOTE — Patient Instructions (Signed)
Your recent CT scan revealed further slight decrease in your disease You will be referred to Dr. Sondra Come for prophylactic brain radiation Follow up in 2 months with a restaging CT scan of your chest, abdomen and pelvis to re-evaluate your disease

## 2014-07-29 NOTE — Progress Notes (Signed)
Thoracic Location of Tumor / Histology: small cell lung cancer of left lower lung with liver metasises diagnosed in July of 2015 to be seen for cranial irradiation as well as palliative radiotherapy to the residual disease in the chest   Patient presented Earlier this year the patient noticed some discomfort in the chest after doing pushups. He also was noted to have a frequent cough.   Biopsies revealed:  02/21/14 Diagnosis Liver, needle/core biopsy, right hepatic lobe - METASTATIC POORLY DIFFERENTIATED NEUROENDOCRINE CARCINOMA. PLEASE SEE COMMENT.  Tobacco/Marijuana/Snuff/ETOH use: He has a 22.5 pack-year smoking history. He quit smoking 2-3 months ago. He has never used smokeless tobacco. He reports that he drinks about 18 ounces of alcohol per week. He reports that he does not use illicit drugs.  Past/Anticipated interventions by cardiothoracic surgery, if any: no  Past/Anticipated interventions by medical oncology, if any: systemic chemotherapy with carboplatin for AUC of 5 on day 1 and etoposide at 120 mg/M2 on days 1, 2 and 3 with Neulasta support on day 4. Status post 6 cycles on 06/21/14.  Signs/Symptoms  Weight changes, if any: no  Respiratory complaints, if any: yes - occasional productive cough with white sputum, denies shortness of breath.  Oxygen saturation on room air was 90%.  Hemoptysis, if any: no  Pain issues, if any:  no  SAFETY ISSUES:  Prior radiation? no  Pacemaker/ICD? no   Possible current pregnancy?no  Is the patient on methotrexate? no  Current Complaints / other details:

## 2014-08-03 ENCOUNTER — Ambulatory Visit
Admission: RE | Admit: 2014-08-03 | Discharge: 2014-08-03 | Disposition: A | Discharge status: Home / Self Care | Payer: Federal, State, Local not specified - PPO | Source: Ambulatory Visit | Attending: Radiation Oncology | Admitting: Radiation Oncology

## 2014-08-03 ENCOUNTER — Telehealth: Payer: Self-pay | Admitting: *Deleted

## 2014-08-03 ENCOUNTER — Encounter: Payer: Self-pay | Admitting: Radiation Oncology

## 2014-08-03 VITALS — BP 130/78 | HR 90 | Temp 98.2°F | Resp 20 | Ht 73.0 in | Wt 242.4 lb

## 2014-08-03 DIAGNOSIS — C3492 Malignant neoplasm of unspecified part of left bronchus or lung: Secondary | ICD-10-CM

## 2014-08-03 DIAGNOSIS — Z7982 Long term (current) use of aspirin: Secondary | ICD-10-CM | POA: Diagnosis not present

## 2014-08-03 DIAGNOSIS — Z79899 Other long term (current) drug therapy: Secondary | ICD-10-CM | POA: Insufficient documentation

## 2014-08-03 DIAGNOSIS — C7951 Secondary malignant neoplasm of bone: Secondary | ICD-10-CM | POA: Insufficient documentation

## 2014-08-03 DIAGNOSIS — Z7951 Long term (current) use of inhaled steroids: Secondary | ICD-10-CM | POA: Diagnosis not present

## 2014-08-03 DIAGNOSIS — Z510 Encounter for antineoplastic radiation therapy: Secondary | ICD-10-CM | POA: Insufficient documentation

## 2014-08-03 DIAGNOSIS — Z9221 Personal history of antineoplastic chemotherapy: Secondary | ICD-10-CM | POA: Insufficient documentation

## 2014-08-03 NOTE — Progress Notes (Signed)
Please see the Nurse Progress Note in the MD Initial Consult Encounter for this patient. 

## 2014-08-03 NOTE — Progress Notes (Signed)
Radiation Oncology         [phone] ________________________________  Name: Cote Mayabb MRN: [id number]  Date: 08/03/2014  DOB: [birth year]  Follow-Up Visit Note  CC: Lujean Amel, MD  Curt Bears, MD    ICD-9-CM ICD-10-CM   1. Small cell lung cancer, left 162.9 C34.92 MR Brain W Wo Contrast    Diagnosis:   Extensive stage small cell lung cancer, Stage IV (T2a, N1, M1b)     Narrative:  The patient returns today for further evaluation. Patient was initially seen in the multidisciplinary thoracic oncology clinic on 03/03/2014. Since that time the patient has completed his recommended 6 cycles of carboplatinum and etoposide. Patient tolerated this therapy quite well and continued to work full-time throughout his treatment. Recent imaging study shows good response to his chemotherapy with no more evidence of liver metastasis. Patient has small lymph nodes in the upper abdomen area also improved with chemotherapy. The patient's osseous metastasis are stable. Patient continues to have a left lower lobe pulmonary nodule. He denies any headaches visual problems or double vision. Patient denies any significant pain in his back area but will notice some times at work some discomfort in the lower back. He denies any focal motor weakness or pain in his upper leg areas.                             ALLERGIES:  has No Known Allergies.  Meds: Current Outpatient Prescriptions  Medication Sig Dispense Refill  . albuterol (PROVENTIL HFA;VENTOLIN HFA) 108 (90 BASE) MCG/ACT inhaler Inhale 2 puffs into the lungs every 6 (six) hours as needed for wheezing. 1 Inhaler 0  . amLODipine (NORVASC) 10 MG tablet Take 10 mg by mouth daily.    Marland Kitchen aspirin EC 81 MG tablet Take 81 mg by mouth daily.    Marland Kitchen atorvastatin (LIPITOR) 40 MG tablet Take 40 mg by mouth daily.    . calcium-vitamin D (OSCAL WITH D) 500-200 MG-UNIT per tablet Take 1 tablet by mouth.    . fenofibrate 160 MG tablet Take 160 mg by mouth  daily.    . Fluticasone-Salmeterol (ADVAIR) 250-50 MCG/DOSE AEPB Inhale 1 puff into the lungs every 12 (twelve) hours.    Marland Kitchen glipiZIDE (GLUCOTROL XL) 2.5 MG 24 hr tablet     . metFORMIN (GLUCOPHAGE) 500 MG tablet Take 1,000 mg by mouth 2 (two) times daily with a meal.     . metoprolol tartrate (LOPRESSOR) 25 MG tablet Take 25 mg by mouth daily.     . Multiple Vitamins-Minerals (CENTRUM SILVER ADULT 50+ PO) Take by mouth.    . ONE TOUCH ULTRA TEST test strip     . pyridOXINE (VITAMIN B-6) 100 MG tablet Take 100 mg by mouth daily.    . vitamin C (ASCORBIC ACID) 500 MG tablet Take 500 mg by mouth daily.    Marland Kitchen HYDROcodone-acetaminophen (NORCO/VICODIN) 5-325 MG per tablet Take 1 tablet by mouth every 6 (six) hours as needed for moderate pain. (Patient not taking: Reported on 08/03/2014) 15 tablet 0  . pantoprazole (PROTONIX) 40 MG tablet     . prochlorperazine (COMPAZINE) 10 MG tablet Take 1 tablet (10 mg total) by mouth every 6 (six) hours as needed for nausea or vomiting. (Patient not taking: Reported on 08/03/2014) 30 tablet 0   No current facility-administered medications for this encounter.    Physical Findings: The patient is in no acute distress. Patient is alert and oriented.  height is 6\' 1"  (1.854 m) and weight is 242 lb 6.4 oz (109.952 kg). His oral temperature is 98.2 F (36.8 C). His blood pressure is 130/78 and his pulse is 90. His respiration is 20 and oxygen saturation is 90%. .  No palpable supraclavicular or axillary adenopathy. The lungs are clear to auscultation. The heart has a regular rhythm and rate. The neurological examination is nonfocal. Palpation along the spine area reveals no areas of point tenderness.  Lab Findings: Lab Results  Component Value Date   WBC 5.2 07/12/2014   HGB 11.3* 07/12/2014   HCT 33.5* 07/12/2014   MCV 106.2* 07/12/2014   PLT 234 07/12/2014    Radiographic Findings: Ct Chest W Contrast  07/07/2014   CLINICAL DATA:  Restaging extensive stage  small-cell lung cancer, with liver metastases, chemotherapy in progress.  EXAM: CT CHEST, ABDOMEN, AND PELVIS WITH CONTRAST  TECHNIQUE: Multidetector CT imaging of the chest, abdomen and pelvis was performed following the standard protocol during bolus administration of intravenous contrast.  CONTRAST:  163mL OMNIPAQUE IOHEXOL 300 MG/ML  SOLN  COMPARISON:  05/24/2014  FINDINGS: CT CHEST FINDINGS  1.3 x 2.2 cm left lower lobe nodule (series 5/ image 38), corresponding to known primary bronchogenic neoplasm, previously 1.6 x 2.3 cm.  Lungs are otherwise clear.  No pleural effusion or pneumothorax.  Visualized thyroid is unremarkable.  The heart is normal in size. No pericardial effusion. Coronary atherosclerosis. Mild atherosclerotic calcifications of the aortic arch.  No suspicious mediastinal, hilar, or axillary lymphadenopathy. 8 mm short axis right paratracheal node with preservation of the normal fatty hilum (series 2/ image 23).  Multifocal osseous metastases in the visualized thoracic spine, including a dominant lytic metastasis at T7 with associated mild pathologic fracture (sagittal image 72), unchanged. Known left scapular metastasis is not evident on unenhanced CT.  CT ABDOMEN AND PELVIS FINDINGS  Hepatobiliary: Liver is notable for hepatic steatosis. Prior hepatic metastasis is no longer evident on CT.  Layering gallstone (series 2/ image 70), without associated inflammatory changes.  Pancreas: Within normal limits.  Spleen: Within normal limits.  Adrenals/Urinary Tract: Adrenal glands are unremarkable.  Kidneys are within normal limits.  No hydronephrosis.  Bladder is within normal limits.  Stomach/Bowel: Stomach is unremarkable.  No evidence of bowel obstruction.  Normal appendix.  Vascular/Lymphatic: Atherosclerotic calcifications of the abdominal aorta and branch vessels.  Small upper abdominal lymph nodes including peripancreatic, periportal, and portacaval nodes measuring 8-9 mm short axis (series 2/  images 66 and 7), previously 12-13 mm.  Reproductive: Prostate is unremarkable.  Other: No abdominopelvic ascites.  Musculoskeletal: Mild to moderate loss of height at L1 and L3. Stable sclerotic metastasis at S tube. Sclerotic metastases involving the left iliac bone (series 2/images 103 and 110).  IMPRESSION: 2.2 cm left lower lobe pulmonary nodule, corresponding to known primary bronchogenic neoplasm, mildly decreased.  Small upper abdominal lymph nodes, mildly decreased.  Stable multifocal osseous metastases.   Electronically Signed   By: Julian Hy M.D.   On: 07/07/2014 15:14   Ct Abdomen Pelvis W Contrast  07/07/2014   CLINICAL DATA:  Restaging extensive stage small-cell lung cancer, with liver metastases, chemotherapy in progress.  EXAM: CT CHEST, ABDOMEN, AND PELVIS WITH CONTRAST  TECHNIQUE: Multidetector CT imaging of the chest, abdomen and pelvis was performed following the standard protocol during bolus administration of intravenous contrast.  CONTRAST:  124mL OMNIPAQUE IOHEXOL 300 MG/ML  SOLN  COMPARISON:  05/24/2014  FINDINGS: CT CHEST FINDINGS  1.3 x 2.2 cm  left lower lobe nodule (series 5/ image 38), corresponding to known primary bronchogenic neoplasm, previously 1.6 x 2.3 cm.  Lungs are otherwise clear.  No pleural effusion or pneumothorax.  Visualized thyroid is unremarkable.  The heart is normal in size. No pericardial effusion. Coronary atherosclerosis. Mild atherosclerotic calcifications of the aortic arch.  No suspicious mediastinal, hilar, or axillary lymphadenopathy. 8 mm short axis right paratracheal node with preservation of the normal fatty hilum (series 2/ image 23).  Multifocal osseous metastases in the visualized thoracic spine, including a dominant lytic metastasis at T7 with associated mild pathologic fracture (sagittal image 72), unchanged. Known left scapular metastasis is not evident on unenhanced CT.  CT ABDOMEN AND PELVIS FINDINGS  Hepatobiliary: Liver is notable for  hepatic steatosis. Prior hepatic metastasis is no longer evident on CT.  Layering gallstone (series 2/ image 70), without associated inflammatory changes.  Pancreas: Within normal limits.  Spleen: Within normal limits.  Adrenals/Urinary Tract: Adrenal glands are unremarkable.  Kidneys are within normal limits.  No hydronephrosis.  Bladder is within normal limits.  Stomach/Bowel: Stomach is unremarkable.  No evidence of bowel obstruction.  Normal appendix.  Vascular/Lymphatic: Atherosclerotic calcifications of the abdominal aorta and branch vessels.  Small upper abdominal lymph nodes including peripancreatic, periportal, and portacaval nodes measuring 8-9 mm short axis (series 2/ images 66 and 7), previously 12-13 mm.  Reproductive: Prostate is unremarkable.  Other: No abdominopelvic ascites.  Musculoskeletal: Mild to moderate loss of height at L1 and L3. Stable sclerotic metastasis at S tube. Sclerotic metastases involving the left iliac bone (series 2/images 103 and 110).  IMPRESSION: 2.2 cm left lower lobe pulmonary nodule, corresponding to known primary bronchogenic neoplasm, mildly decreased.  Small upper abdominal lymph nodes, mildly decreased.  Stable multifocal osseous metastases.   Electronically Signed   By: Julian Hy M.D.   On: 07/07/2014 15:14    Impression:   Extensive stage small cell lung cancer, Stage IV (T2a, N1, M1b). The patient is doing quite well clinically at this time. His recent scans show good response to his systemic chemotherapy. The patient would be a candidate for prophylactic cranial irradiation given his excellent performance status and young age. Prior to proceeding with this treatment however I would like to repeat his MRI the brain to rule out any subsequent development of metastasis since his previous MRI was approximately 6 months ago.  The patient may be a candidate for radiation treatments to the chest and would consider treating the T7 thoracic spine in the same field  given the extent of involvement of this vertebral body.  Plan:  Reevaluation and simulation after completion of brain MRI.  ____________________________________ Blair Promise, MD

## 2014-08-03 NOTE — Telephone Encounter (Signed)
CALLED PATIENT TO INFORM OF MRI FOR 08-08-14- ARRIVAL TIME - 12:30 PM @ [facility], LVM FOR A RETURN CALL

## 2014-08-08 ENCOUNTER — Ambulatory Visit
Admission: RE | Admit: 2014-08-08 | Discharge: 2014-08-08 | Disposition: A | Discharge status: Home / Self Care | Payer: Federal, State, Local not specified - PPO | Source: Ambulatory Visit | Attending: Radiation Oncology | Admitting: Radiation Oncology

## 2014-08-08 ENCOUNTER — Encounter: Payer: Self-pay | Admitting: Hematology

## 2014-08-08 DIAGNOSIS — C3492 Malignant neoplasm of unspecified part of left bronchus or lung: Secondary | ICD-10-CM

## 2014-08-08 MED ORDER — GADOBENATE DIMEGLUMINE 529 MG/ML IV SOLN
20.0000 mL | Freq: Once | INTRAVENOUS | Status: AC | PRN
  Administered 2014-08-08: 20 mL via INTRAVENOUS

## 2014-08-08 NOTE — Progress Notes (Signed)
Insurance is paying [id number] [email or web address]

## 2014-08-10 ENCOUNTER — Ambulatory Visit
Admission: RE | Admit: 2014-08-10 | Discharge: 2014-08-10 | Disposition: A | Discharge status: Home / Self Care | Payer: Federal, State, Local not specified - PPO | Source: Ambulatory Visit | Attending: Radiation Oncology | Admitting: Radiation Oncology

## 2014-08-10 DIAGNOSIS — C3490 Malignant neoplasm of unspecified part of unspecified bronchus or lung: Secondary | ICD-10-CM

## 2014-08-10 DIAGNOSIS — Z510 Encounter for antineoplastic radiation therapy: Secondary | ICD-10-CM | POA: Diagnosis not present

## 2014-08-11 DIAGNOSIS — Z510 Encounter for antineoplastic radiation therapy: Secondary | ICD-10-CM | POA: Diagnosis not present

## 2014-08-15 ENCOUNTER — Ambulatory Visit (HOSPITAL_COMMUNITY): Payer: Federal, State, Local not specified - PPO

## 2014-08-15 ENCOUNTER — Ambulatory Visit
Admission: RE | Admit: 2014-08-15 | Discharge: 2014-08-15 | Disposition: A | Discharge status: Home / Self Care | Payer: Federal, State, Local not specified - PPO | Source: Ambulatory Visit | Attending: Radiation Oncology | Admitting: Radiation Oncology

## 2014-08-15 DIAGNOSIS — Z510 Encounter for antineoplastic radiation therapy: Secondary | ICD-10-CM | POA: Diagnosis not present

## 2014-08-16 ENCOUNTER — Ambulatory Visit
Admission: RE | Admit: 2014-08-16 | Discharge: 2014-08-16 | Disposition: A | Discharge status: Home / Self Care | Payer: Federal, State, Local not specified - PPO | Source: Ambulatory Visit | Attending: Radiation Oncology | Admitting: Radiation Oncology

## 2014-08-16 ENCOUNTER — Telehealth: Payer: Self-pay | Admitting: *Deleted

## 2014-08-16 DIAGNOSIS — Z510 Encounter for antineoplastic radiation therapy: Secondary | ICD-10-CM | POA: Diagnosis not present

## 2014-08-16 DIAGNOSIS — C3490 Malignant neoplasm of unspecified part of unspecified bronchus or lung: Secondary | ICD-10-CM | POA: Insufficient documentation

## 2014-08-16 MED ORDER — EMOLLIENT BASE EX CREA: TOPICAL_CREAM | CUTANEOUS | Status: AC | PRN

## 2014-08-16 MED ORDER — BIAFINE EX EMUL
Freq: Every day | CUTANEOUS | Status: DC
  Administered 2014-08-16: 14:00:00 via TOPICAL

## 2014-08-16 NOTE — Progress Notes (Signed)
  Radiation Oncology         [phone] ________________________________  Name: Kerry Peck MRN: [id number]  Date: 08/16/2014  DOB: Nov 05, 1960  Weekly Radiation Therapy Management   Diagnosis: Extensive stage small cell lung cancer, Stage IV (T2a, N1, M1b)   Current Dose: 2.5 Gy     Planned Dose:  25 Gy to brain and 35 gray to the chest  Narrative . . . . . . . . The patient presents for routine under treatment assessment.                                   The patient is without complaint.                                 Set-up films were reviewed.                                 The chart was checked. Physical Findings. . . The lungs are clear. The heart has a regular rhythm and rate. Examination of the oral cavity reveals no secondary infection. Impression . . . . . . . The patient is tolerating radiation. Plan . . . . . . . . . . . . Continue treatment as planned. We discussed potential steroid medication if the patient develops nausea or headaches with his prophylactic cranial irradiation.  ________________________________   Blair Promise, PhD, MD

## 2014-08-16 NOTE — Progress Notes (Signed)
Weekly rad txs 1st to brain,chest and t-spine, patient education done, biafine cream, radiation therapy and you book, skin products  flyer given to patient, discussed skin irritation,pain,fatigue, nausea,vomiting, throat changes,difficulty swallowing, head acahes,vision changes, , increase protein in diet, drink plenty fluids, also gave Santiago Glad Hess,RN business card to patient, no c/o today, eating well, no fatigue, works nights sleeps during the days, gave verbal understanding, teach back given, biafine to apple to affeceted treatment areas daily and prn,  1:54 PM

## 2014-08-16 NOTE — Progress Notes (Signed)
  Radiation Oncology         (336) [phone] ________________________________  Name: Kerry Peck MRN: [id number]  Date: 08/10/2014  DOB: 06-05-1961  SIMULATION AND TREATMENT PLANNING NOTE    ICD-9-CM ICD-10-CM   1. Small cell lung cancer, unspecified laterality 162.9 C34.90     DIAGNOSIS:  Extensive stage small cell lung cancer, Stage IV (T2a, N1, M1b)   NARRATIVE:  The patient was brought to the [facility].  Identity was confirmed.  All relevant records and images related to the planned course of therapy were reviewed.  The patient freely provided informed written consent to proceed with treatment after reviewing the details related to the planned course of therapy. The consent form was witnessed and verified by the simulation staff.  Then, the patient was set-up in a stable reproducible  supine position for radiation therapy.  CT images were obtained.  Surface markings were placed.  The CT images were loaded into the planning software.  Then the target and avoidance structures were contoured.  Treatment planning then occurred.  The radiation prescription was entered and confirmed.  Then, I designed and supervised the construction of a total of 6 medically necessary complex treatment devices.  I have requested : 3D Simulation  I have requested a DVH of the following structures: GTV,PTV, heart lungs.  I have ordered:dose calc.  PLAN:  The patient will receive 25 Gy in 10 fractions directed at the whole brain as prophylaxis and 35 gray in 14 fractions directed at the thoracic spine metastasis and left lung lesion  ________________________________  -----------------------------------  Blair Promise, PhD, MD

## 2014-08-16 NOTE — Telephone Encounter (Signed)
error 

## 2014-08-17 ENCOUNTER — Ambulatory Visit
Admission: RE | Admit: 2014-08-17 | Discharge: 2014-08-17 | Disposition: A | Discharge status: Home / Self Care | Payer: Federal, State, Local not specified - PPO | Source: Ambulatory Visit | Attending: Radiation Oncology | Admitting: Radiation Oncology

## 2014-08-17 DIAGNOSIS — Z510 Encounter for antineoplastic radiation therapy: Secondary | ICD-10-CM | POA: Diagnosis not present

## 2014-08-18 ENCOUNTER — Ambulatory Visit
Admission: RE | Admit: 2014-08-18 | Discharge: 2014-08-18 | Disposition: A | Discharge status: Home / Self Care | Payer: Federal, State, Local not specified - PPO | Source: Ambulatory Visit | Attending: Radiation Oncology | Admitting: Radiation Oncology

## 2014-08-18 DIAGNOSIS — Z510 Encounter for antineoplastic radiation therapy: Secondary | ICD-10-CM | POA: Diagnosis not present

## 2014-08-22 ENCOUNTER — Ambulatory Visit
Admission: RE | Admit: 2014-08-22 | Discharge: 2014-08-22 | Disposition: A | Discharge status: Home / Self Care | Payer: Federal, State, Local not specified - PPO | Source: Ambulatory Visit | Attending: Radiation Oncology | Admitting: Radiation Oncology

## 2014-08-22 DIAGNOSIS — Z510 Encounter for antineoplastic radiation therapy: Secondary | ICD-10-CM | POA: Diagnosis not present

## 2014-08-23 ENCOUNTER — Ambulatory Visit
Admission: RE | Admit: 2014-08-23 | Discharge: 2014-08-23 | Disposition: A | Discharge status: Home / Self Care | Payer: Federal, State, Local not specified - PPO | Source: Ambulatory Visit | Attending: Radiation Oncology | Admitting: Radiation Oncology

## 2014-08-23 ENCOUNTER — Ambulatory Visit (HOSPITAL_COMMUNITY)
Admission: RE | Admit: 2014-08-23 | Discharge: 2014-08-23 | Disposition: A | Discharge status: Home / Self Care | Payer: Federal, State, Local not specified - PPO | Source: Ambulatory Visit | Attending: Nurse Practitioner | Admitting: Nurse Practitioner

## 2014-08-23 ENCOUNTER — Encounter: Payer: Self-pay | Admitting: Nurse Practitioner

## 2014-08-23 ENCOUNTER — Other Ambulatory Visit: Payer: Self-pay

## 2014-08-23 ENCOUNTER — Encounter: Payer: Self-pay | Admitting: Radiation Oncology

## 2014-08-23 ENCOUNTER — Ambulatory Visit (HOSPITAL_BASED_OUTPATIENT_CLINIC_OR_DEPARTMENT_OTHER): Payer: Federal, State, Local not specified - PPO | Admitting: Nurse Practitioner

## 2014-08-23 VITALS — BP 121/70 | HR 90 | Temp 98.5°F | Resp 18 | Wt 237.6 lb

## 2014-08-23 VITALS — BP 129/71 | HR 85 | Temp 98.1°F | Resp 18 | Ht 73.0 in | Wt 235.6 lb

## 2014-08-23 DIAGNOSIS — C3492 Malignant neoplasm of unspecified part of left bronchus or lung: Secondary | ICD-10-CM

## 2014-08-23 DIAGNOSIS — C3490 Malignant neoplasm of unspecified part of unspecified bronchus or lung: Secondary | ICD-10-CM | POA: Insufficient documentation

## 2014-08-23 DIAGNOSIS — R062 Wheezing: Secondary | ICD-10-CM | POA: Diagnosis present

## 2014-08-23 DIAGNOSIS — Z510 Encounter for antineoplastic radiation therapy: Secondary | ICD-10-CM | POA: Diagnosis not present

## 2014-08-23 DIAGNOSIS — J40 Bronchitis, not specified as acute or chronic: Secondary | ICD-10-CM

## 2014-08-23 DIAGNOSIS — R05 Cough: Secondary | ICD-10-CM | POA: Insufficient documentation

## 2014-08-23 DIAGNOSIS — C3410 Malignant neoplasm of upper lobe, unspecified bronchus or lung: Secondary | ICD-10-CM

## 2014-08-23 DIAGNOSIS — C7951 Secondary malignant neoplasm of bone: Secondary | ICD-10-CM | POA: Insufficient documentation

## 2014-08-23 MED ORDER — LEVOFLOXACIN 500 MG PO TABS: 500.0000 mg | ORAL_TABLET | Freq: Every day | ORAL | Status: DC

## 2014-08-23 MED ORDER — ALBUTEROL SULFATE (2.5 MG/3ML) 0.083% IN NEBU
2.5000 mg | INHALATION_SOLUTION | Freq: Once | RESPIRATORY_TRACT | Status: AC
  Administered 2014-08-23: 2.5 mg via RESPIRATORY_TRACT
  Filled 2014-08-23: qty 3

## 2014-08-23 NOTE — Progress Notes (Signed)
Weekly Management Note Current Dose: 12.5Gy  Projected Dose:35 Gy   Narrative:  The patient presents for routine under treatment assessment.  CBCT/MVCT images/Port film x-rays were reviewed.  The chart was checked. Patient has non productive cough x 1 week. No fever. Has been tx in past with zpack and nebulizer with good results for this. Not using albuterol inhaler. No PCP pain is well controlled.   Physical Findings:  Unchanged  Vitals:  Filed Vitals:   08/23/14 1438  BP: 121/70  Pulse: 90  Temp: 98.5 F (36.9 C)  Resp: 18   Weight:  Wt Readings from Last 3 Encounters:  08/23/14 237 lb 9.6 oz (107.775 kg)  08/08/14 247 lb (112.038 kg)  08/03/14 242 lb 6.4 oz (109.952 kg)   Lab Results  Component Value Date   WBC 5.2 07/12/2014   HGB 11.3* 07/12/2014   HCT 33.5* 07/12/2014   MCV 106.2* 07/12/2014   PLT 234 07/12/2014   Lab Results  Component Value Date   CREATININE 0.8 07/12/2014   BUN 6.9* 07/12/2014   NA 141 07/12/2014   K 3.5 07/12/2014   CL 101 04/05/2014   CO2 26 07/12/2014     Impression:  The patient is tolerating radiation.  Plan:  Continue treatment as planned.Send to symptom management for eval. I do not think he needs an anitbiotic given his lack of fever. Perhaps a COPD exacerbation?

## 2014-08-23 NOTE — Assessment & Plan Note (Signed)
Patient is status post 6 cycles of carboplatin/etoposide which was completed in October 2015.  He is currently undergoing whole brain irradiation prophylactically.  His final radiation treatment is scheduled for 09/06/2014.  He is not currently receiving any chemotherapy; is on observation only.  Patient has plans to obtain a full body CT for restaging purposes on 09/09/2014.  He is scheduled to return to the [facility] for labs and a follow-up visit to review the scan results on 09/13/2014.

## 2014-08-23 NOTE — Progress Notes (Signed)
Persistent intense dry cough x 1 week. Reports fatigue. Patient concerned he "may have pneumonia again." Weight and vitals stable. Denies pain. No skin changes noted to chest or upper back. Reports using radiaplex bid as directed. Denies esophagitis. Reports headache associated with excessive coughing. Denies nausea, vomiting, dizziness, diplopia or ringing in the ears.

## 2014-08-23 NOTE — Progress Notes (Signed)
will   SYMPTOM MANAGEMENT CLINIC   HPI: Kerry Peck 53 y.o. male diagnosed with lung cancer.  Agent is status post 6 cycles of carboplatin/etoposide chemotherapy regimen.  Currently undergoing whole brain irradiation therapy.  Patient presented to the [facility] today with complaint of bronchitis symptoms which included a worsening, hacking, nonproductive cough and increased wheezing.  Patient states that he has an albuterol inhaler at home; but he only occasionally uses it.  Patient denies any chest pain, chest pressure, or pain with inspiration.  He denies any GI symptoms whatsoever.  He denies any recent fevers or chills.   HPI  CURRENT THERAPY: Upcoming Treatment Dates - LUNG SMALL CELL Carboplatin D1 / Etoposide D1-3 q21d Days with orders from any treatment category:  No upcoming days in selected categories.    ROS  Past Medical History  Diagnosis Date  . Diabetes mellitus without complication   . Hypertension   . Smoking addiction   . Cancer     lung and liver    History reviewed. No pertinent past surgical history.  has Hypertension; Hyperlipidemia; Diabetes mellitus; Overweight; Nicotine addiction; Reactive airway disease; Mass of lower lobe of left lung; Small cell lung cancer; Mucositis due to antineoplastic therapy; Peripheral edema; and Bronchitis on his problem list.     has No Known Allergies.    Medication List       This list is accurate as of: 08/23/14  5:43 PM.  Always use your most recent med list.               5 SERIES BP MONITOR Devi     albuterol 108 (90 BASE) MCG/ACT inhaler  Commonly known as:  PROVENTIL HFA;VENTOLIN HFA  Inhale 2 puffs into the lungs every 6 (six) hours as needed for wheezing.     amLODipine 10 MG tablet  Commonly known as:  NORVASC  Take 10 mg by mouth daily.     aspirin EC 81 MG tablet  Take 81 mg by mouth daily.     atorvastatin 40 MG tablet  Commonly known as:  LIPITOR  Take 40 mg by mouth daily.       calcium-vitamin D 500-200 MG-UNIT per tablet  Commonly known as:  OSCAL WITH D  Take 1 tablet by mouth.     CENTRUM SILVER ADULT 50+ PO  Take by mouth.     emollient cream  Commonly known as:  BIAFINE  Apply topically as needed.     fenofibrate 160 MG tablet  Take 160 mg by mouth daily.     Fluticasone-Salmeterol 250-50 MCG/DOSE Aepb  Commonly known as:  ADVAIR  Inhale 1 puff into the lungs every 12 (twelve) hours.     glipiZIDE 2.5 MG 24 hr tablet  Commonly known as:  GLUCOTROL XL  Take 2.5 mg by mouth daily with breakfast. increased to 5mg  daily 08/16/14     HYDROcodone-acetaminophen 5-325 MG per tablet  Commonly known as:  NORCO/VICODIN  Take 1 tablet by mouth every 6 (six) hours as needed for moderate pain.     levofloxacin 500 MG tablet  Commonly known as:  LEVAQUIN  Take 1 tablet (500 mg total) by mouth daily.     metFORMIN 500 MG tablet  Commonly known as:  GLUCOPHAGE  Take 1,000 mg by mouth 2 (two) times daily with a meal.     metoprolol tartrate 25 MG tablet  Commonly known as:  LOPRESSOR  Take 25 mg by mouth daily.  ONE TOUCH ULTRA TEST test strip  Generic drug:  glucose blood     pantoprazole 40 MG tablet  Commonly known as:  PROTONIX  Take 40 mg by mouth as needed.     prochlorperazine 10 MG tablet  Commonly known as:  COMPAZINE  Take 1 tablet (10 mg total) by mouth every 6 (six) hours as needed for nausea or vomiting.     pyridOXINE 100 MG tablet  Commonly known as:  VITAMIN B-6  Take 100 mg by mouth daily.     vitamin C 500 MG tablet  Commonly known as:  ASCORBIC ACID  Take 500 mg by mouth daily.         PHYSICAL EXAMINATION  Blood pressure 129/71, pulse 85, temperature 98.1 F (36.7 C), temperature source Oral, resp. rate 18, height 6\' 1"  (1.854 m), weight 235 lb 9.6 oz (106.867 kg), SpO2 99 %.  Physical Exam  Constitutional: He is oriented to person, place, and time. Vital signs are normal. He appears unhealthy.  HENT:  Head:  Normocephalic and atraumatic.  Mouth/Throat: Oropharyngeal exudate present.  Eyes: Conjunctivae and EOM are normal. Pupils are equal, round, and reactive to light. Right eye exhibits no discharge. Left eye exhibits no discharge. No scleral icterus.  Neck: Normal range of motion. Neck supple. No JVD present. No tracheal deviation present. No thyromegaly present.  Cardiovascular: Normal rate, regular rhythm, normal heart sounds and intact distal pulses.   Pulmonary/Chest: No stridor. No respiratory distress. He has wheezes. He has rales. He exhibits no tenderness.  Patient has diminished breath sounds and wheezing throughout all lung fields.  Patient does appear slightly short of breath on exam; but no acute respiratory distress.  Following patient's nebulizer treatment-patient was breathing much easier; with less wheezing and coughing.  Abdominal: Soft. Bowel sounds are normal. He exhibits no distension and no mass. There is no tenderness. There is no rebound and no guarding.  Musculoskeletal: Normal range of motion. He exhibits no edema or tenderness.  Lymphadenopathy:    He has no cervical adenopathy.  Neurological: He is alert and oriented to person, place, and time. Gait normal.  Skin: Skin is warm and dry. No rash noted. No erythema. There is pallor.  Psychiatric: Affect normal.  Nursing note and vitals reviewed.   LABORATORY DATA:. No visits with results within 3 Day(s) from this visit. Latest known visit with results is:  Appointment on 07/12/2014  Component Date Value Ref Range Status  . WBC 07/12/2014 5.2  4.0 - 10.3 10e3/uL Final  . NEUT# 07/12/2014 2.3  1.5 - 6.5 10e3/uL Final  . HGB 07/12/2014 11.3* 13.0 - 17.1 g/dL Final  . HCT 07/12/2014 33.5* 38.4 - 49.9 % Final  . Platelets 07/12/2014 234  140 - 400 10e3/uL Final  . MCV 07/12/2014 106.2* 79.3 - 98.0 fL Final  . MCH 07/12/2014 35.9* 27.2 - 33.4 pg Final  . MCHC 07/12/2014 33.8  32.0 - 36.0 g/dL Final  . RBC 07/12/2014  3.15* 4.20 - 5.82 10e6/uL Final  . RDW 07/12/2014 16.4* 11.0 - 14.6 % Final  . lymph# 07/12/2014 2.0  0.9 - 3.3 10e3/uL Final  . MONO# 07/12/2014 0.8  0.1 - 0.9 10e3/uL Final  . Eosinophils Absolute 07/12/2014 0.1  0.0 - 0.5 10e3/uL Final  . Basophils Absolute 07/12/2014 0.0  0.0 - 0.1 10e3/uL Final  . NEUT% 07/12/2014 44.1  39.0 - 75.0 % Final  . LYMPH% 07/12/2014 38.9  14.0 - 49.0 % Final  . MONO% 07/12/2014 15.0*  0.0 - 14.0 % Final  . EOS% 07/12/2014 1.2  0.0 - 7.0 % Final  . BASO% 07/12/2014 0.8  0.0 - 2.0 % Final  . Sodium 07/12/2014 141  136 - 145 mEq/L Final  . Potassium 07/12/2014 3.5  3.5 - 5.1 mEq/L Final  . Chloride 07/12/2014 104  98 - 109 mEq/L Final  . CO2 07/12/2014 26  22 - 29 mEq/L Final  . Glucose 07/12/2014 203* 70 - 140 mg/dl Final  . BUN 07/12/2014 6.9* 7.0 - 26.0 mg/dL Final  . Creatinine 07/12/2014 0.8  0.7 - 1.3 mg/dL Final  . Total Bilirubin 07/12/2014 0.40  0.20 - 1.20 mg/dL Final  . Alkaline Phosphatase 07/12/2014 92  40 - 150 U/L Final  . AST 07/12/2014 27  5 - 34 U/L Final  . ALT 07/12/2014 29  0 - 55 U/L Final  . Total Protein 07/12/2014 6.9  6.4 - 8.3 g/dL Final  . Albumin 07/12/2014 4.1  3.5 - 5.0 g/dL Final  . Calcium 07/12/2014 9.4  8.4 - 10.4 mg/dL Final  . Anion Gap 07/12/2014 11  3 - 11 mEq/L Final     RADIOGRAPHIC STUDIES: Dg Chest 2 View  08/23/2014   CLINICAL DATA:  Cough and wheezing.  History of lung carcinoma  EXAM: CHEST  2 VIEW  COMPARISON:  Chest CT July 07, 2014; chest radiograph Jan 16, 2014  FINDINGS: Left lower lobe mass currently measures 1.8 x 1.3 cm, slightly smaller compared to recent prior study. Elsewhere lungs are clear. Heart size and pulmonary vascularity are normal. No adenopathy. There is anterior wedging of a lower thoracic vertebral body, stable. Bony metastatic disease in mid thoracic region is present, better seen on CT.  IMPRESSION: Left lower lobe lung mass slightly smaller. Bony metastatic lesions present but  better seen on CT. No lung edema or consolidation.   Electronically Signed   By: Lowella Grip M.D.   On: 08/23/2014 16:30    ASSESSMENT/PLAN:    Bronchitis Patient presented with a hacking, non-productive cough which was sometimes continual during the exam today.  Patient noted to have decreased breath sounds and wheezing bilaterally.  Patient did appear slightly short of breath; but in no acute distress.  Patient was given an albuterol nebulizer while at the cancer center; which did greatly improve both the cough and his wheezing.  Patient states he has an albuterol inhaler at home; but he only occasionally uses it.  Patient was encouraged to use his albuterol inhaler 2 puffs every 6 hours for the next few days.  Patient also obtained a chest x-ray which revealed no pneumonia.  Patient was also prescribed Levaquin antibiotic for a total of 7 days.  Patient was encouraged to go directly to the emergency department overnight if he develops any worsening symptoms whatsoever.  Small cell lung cancer Patient is status post 6 cycles of carboplatin/etoposide which was completed in October 2015.  He is currently undergoing whole brain irradiation prophylactically.  His final radiation treatment is scheduled for 09/06/2014.  He is not currently receiving any chemotherapy; is on observation only.  Patient has plans to obtain a full body CT for restaging purposes on 09/09/2014.  He is scheduled to return to the [facility] for labs and a follow-up visit to review the scan results on 09/13/2014.  Patient stated understanding of all instructions; and was in agreement with this plan of care. The patient knows to call the clinic with any problems, questions or concerns.   Review/collaboration  with Dr. Julien Nordmann regarding all aspects of patient's visit today.   Total time spent with patient was 25 minutes;  with greater than 75 percent of that time spent in face to face counseling regarding his symptoms,  and coordination of care and follow up.  Disclaimer: This note was dictated with voice recognition software. Similar sounding words can inadvertently be transcribed and may not be corrected upon review.   Drue Second, NP 08/23/2014

## 2014-08-23 NOTE — Assessment & Plan Note (Signed)
Patient presented with a hacking, non-productive cough which was sometimes continual during the exam today.  Patient noted to have decreased breath sounds and wheezing bilaterally.  Patient did appear slightly short of breath; but in no acute distress.  Patient was given an albuterol nebulizer while at the cancer center; which did greatly improve both the cough and his wheezing.  Patient states he has an albuterol inhaler at home; but he only occasionally uses it.  Patient was encouraged to use his albuterol inhaler 2 puffs every 6 hours for the next few days.  Patient also obtained a chest x-ray which revealed no pneumonia.  Patient was also prescribed Levaquin antibiotic for a total of 7 days.  Patient was encouraged to go directly to the emergency department overnight if he develops any worsening symptoms whatsoever.

## 2014-08-24 ENCOUNTER — Ambulatory Visit
Admission: RE | Admit: 2014-08-24 | Discharge: 2014-08-24 | Disposition: A | Discharge status: Home / Self Care | Payer: Federal, State, Local not specified - PPO | Source: Ambulatory Visit | Attending: Radiation Oncology | Admitting: Radiation Oncology

## 2014-08-24 DIAGNOSIS — Z510 Encounter for antineoplastic radiation therapy: Secondary | ICD-10-CM | POA: Diagnosis not present

## 2014-08-25 ENCOUNTER — Ambulatory Visit
Admission: RE | Admit: 2014-08-25 | Discharge: 2014-08-25 | Disposition: A | Discharge status: Home / Self Care | Payer: Federal, State, Local not specified - PPO | Source: Ambulatory Visit | Attending: Radiation Oncology | Admitting: Radiation Oncology

## 2014-08-25 DIAGNOSIS — Z510 Encounter for antineoplastic radiation therapy: Secondary | ICD-10-CM | POA: Diagnosis not present

## 2014-08-29 ENCOUNTER — Ambulatory Visit
Admission: RE | Admit: 2014-08-29 | Discharge: 2014-08-29 | Disposition: A | Discharge status: Home / Self Care | Payer: Federal, State, Local not specified - PPO | Source: Ambulatory Visit | Attending: Radiation Oncology | Admitting: Radiation Oncology

## 2014-08-29 DIAGNOSIS — Z510 Encounter for antineoplastic radiation therapy: Secondary | ICD-10-CM | POA: Diagnosis not present

## 2014-08-30 ENCOUNTER — Encounter: Payer: Self-pay | Admitting: Radiation Oncology

## 2014-08-30 ENCOUNTER — Ambulatory Visit
Admission: RE | Admit: 2014-08-30 | Discharge: 2014-08-30 | Disposition: A | Discharge status: Home / Self Care | Payer: Federal, State, Local not specified - PPO | Source: Ambulatory Visit | Attending: Radiation Oncology | Admitting: Radiation Oncology

## 2014-08-30 VITALS — BP 111/77 | HR 84 | Temp 98.2°F | Resp 20 | Wt 237.1 lb

## 2014-08-30 DIAGNOSIS — C3490 Malignant neoplasm of unspecified part of unspecified bronchus or lung: Secondary | ICD-10-CM

## 2014-08-30 DIAGNOSIS — Z510 Encounter for antineoplastic radiation therapy: Secondary | ICD-10-CM | POA: Diagnosis not present

## 2014-08-30 NOTE — Progress Notes (Addendum)
Weekly rad txs brain, chest and T-spine 9 completed no pain, nausea, coughing, pain, did have heartburn last night 1st time after eating ham/cheese sandwiches, resolved,none now, uses biafine cream on chest, no skin changes on front of chest, mild erythema on back , skin intact, works nights, appetite good, no dificulty swallowing or chewing,completed antibiotic yesterday  2:30 PM  2:30 PM

## 2014-08-30 NOTE — Progress Notes (Signed)
  Radiation Oncology         [phone] ________________________________  Name: Kerry Peck MRN: [id number]  Date: 08/30/2014  DOB: 01-31-1961  Weekly Radiation Therapy Management  DIAGNOSIS: Extensive stage small cell lung cancer, Stage IV (T2a, N1, M1b)  Current Dose: 22.5 Gy     Planned Dose:  35 Gy (22.5 gray of a planned 25 gray directed at the brain as prophylaxis)  Narrative . . . . . . . . The patient presents for routine under treatment assessment.                                   The patient is without complaint. Denies any scalp irritation headaches or nausea. Patient noticed some slight heartburn symptoms after eating last night but none since.                                 Set-up films were reviewed.                                 The chart was checked. Physical Findings. . .  weight is 237 lb 1.6 oz (107.548 kg). His oral temperature is 98.2 F (36.8 C). His blood pressure is 111/77 and his pulse is 84. His respiration is 20 and oxygen saturation is 97%. . The lungs are clear. The heart has a regular rhythm and rate. The abdomen is soft and nontender with normal bowel sounds. No appreciable scalp irritation at this time. Impression . . . . . . . The patient is tolerating radiation. Plan . . . . . . . . . . . . Continue treatment as planned.  ________________________________   Blair Promise, PhD, MD

## 2014-08-31 ENCOUNTER — Ambulatory Visit
Admission: RE | Admit: 2014-08-31 | Discharge: 2014-08-31 | Disposition: A | Discharge status: Home / Self Care | Payer: Federal, State, Local not specified - PPO | Source: Ambulatory Visit | Attending: Radiation Oncology | Admitting: Radiation Oncology

## 2014-08-31 DIAGNOSIS — Z510 Encounter for antineoplastic radiation therapy: Secondary | ICD-10-CM | POA: Diagnosis not present

## 2014-09-01 ENCOUNTER — Ambulatory Visit
Admission: RE | Admit: 2014-09-01 | Discharge: 2014-09-01 | Disposition: A | Discharge status: Home / Self Care | Payer: Federal, State, Local not specified - PPO | Source: Ambulatory Visit | Attending: Radiation Oncology | Admitting: Radiation Oncology

## 2014-09-01 DIAGNOSIS — Z510 Encounter for antineoplastic radiation therapy: Secondary | ICD-10-CM | POA: Diagnosis not present

## 2014-09-02 ENCOUNTER — Ambulatory Visit
Admission: RE | Admit: 2014-09-02 | Discharge: 2014-09-02 | Disposition: A | Discharge status: Home / Self Care | Payer: Federal, State, Local not specified - PPO | Source: Ambulatory Visit | Attending: Radiation Oncology | Admitting: Radiation Oncology

## 2014-09-02 DIAGNOSIS — Z510 Encounter for antineoplastic radiation therapy: Secondary | ICD-10-CM | POA: Diagnosis not present

## 2014-09-05 ENCOUNTER — Ambulatory Visit
Admission: RE | Admit: 2014-09-05 | Discharge: 2014-09-05 | Disposition: A | Discharge status: Home / Self Care | Payer: Federal, State, Local not specified - PPO | Source: Ambulatory Visit | Attending: Radiation Oncology | Admitting: Radiation Oncology

## 2014-09-05 DIAGNOSIS — Z510 Encounter for antineoplastic radiation therapy: Secondary | ICD-10-CM | POA: Diagnosis not present

## 2014-09-06 ENCOUNTER — Ambulatory Visit
Admission: RE | Admit: 2014-09-06 | Discharge: 2014-09-06 | Disposition: A | Discharge status: Home / Self Care | Payer: Federal, State, Local not specified - PPO | Source: Ambulatory Visit | Attending: Radiation Oncology | Admitting: Radiation Oncology

## 2014-09-06 ENCOUNTER — Encounter: Payer: Self-pay | Admitting: Radiation Oncology

## 2014-09-06 VITALS — BP 110/56 | HR 85 | Temp 98.0°F | Resp 16 | Ht 73.0 in | Wt 236.6 lb

## 2014-09-06 DIAGNOSIS — C3490 Malignant neoplasm of unspecified part of unspecified bronchus or lung: Secondary | ICD-10-CM

## 2014-09-06 DIAGNOSIS — Z510 Encounter for antineoplastic radiation therapy: Secondary | ICD-10-CM | POA: Diagnosis not present

## 2014-09-06 NOTE — Progress Notes (Signed)
  Radiation Oncology         [phone] ________________________________  Name: Kerry Peck MRN: [id number]  Date: 09/06/2014  DOB: June 22, 1961  Weekly Radiation Therapy Management  Small cell lung cancer   Staging form: Lung, AJCC 7th Edition     Clinical: Stage IV (T2a, N1, M1b) - Signed by Curt Bears, MD on 03/03/2014     Pathologic: No stage assigned - Unsigned   Current Dose: 35 Gy     Planned Dose:  35 Gy  Narrative . . . . . . . . The patient presents for routine under treatment assessment. He has completed his prophylactic cranial radiation to 25 gray. He completed his chest/T-spine radiation therapy today totaling 35 gray                                   The patient is without complaint. He has noticed some very slight problems with swallowing but is able to eat solids and drink liquids. He denies any scalp irritation or itching                                 Set-up films were reviewed.                                 The chart was checked. Physical Findings. . .  height is 6\' 1"  (1.854 m) and weight is 236 lb 9.6 oz (107.321 kg). His oral temperature is 98 F (36.7 C). His blood pressure is 110/56 and his pulse is 85. His respiration is 16 and oxygen saturation is 95%. . Weight essentially stable.  No significant changes. Impression . . . . . . . The patient is tolerating radiation. Plan . . . . . . . . . . . . Continue treatment as planned. Routine follow-up in one month  ________________________________   Kerry Promise, PhD, MD

## 2014-09-06 NOTE — Progress Notes (Addendum)
Kerry Peck has completed treatment to his brain and chest with 14 fractions.  He denies pain and headaches.  He reports having trouble swallowing 2 times, with the last happening last night.  He denies shortness of breath.  He reports an occasional dry cough.  He reports a good appetite and his weight is down 1 lb from last week.  The skin on his upper left back is pink.  He is using biafine cream.  He has been given a one month follow up card.

## 2014-09-09 ENCOUNTER — Ambulatory Visit (HOSPITAL_COMMUNITY)
Admission: RE | Admit: 2014-09-09 | Discharge: 2014-09-09 | Disposition: A | Discharge status: Home / Self Care | Payer: Federal, State, Local not specified - PPO | Source: Ambulatory Visit | Attending: Physician Assistant | Admitting: Physician Assistant

## 2014-09-09 DIAGNOSIS — K8020 Calculus of gallbladder without cholecystitis without obstruction: Secondary | ICD-10-CM | POA: Diagnosis not present

## 2014-09-09 DIAGNOSIS — Z9221 Personal history of antineoplastic chemotherapy: Secondary | ICD-10-CM | POA: Insufficient documentation

## 2014-09-09 DIAGNOSIS — R911 Solitary pulmonary nodule: Secondary | ICD-10-CM | POA: Diagnosis not present

## 2014-09-09 DIAGNOSIS — Z923 Personal history of irradiation: Secondary | ICD-10-CM | POA: Insufficient documentation

## 2014-09-09 DIAGNOSIS — Z85118 Personal history of other malignant neoplasm of bronchus and lung: Secondary | ICD-10-CM | POA: Insufficient documentation

## 2014-09-09 DIAGNOSIS — Z8505 Personal history of malignant neoplasm of liver: Secondary | ICD-10-CM | POA: Diagnosis not present

## 2014-09-09 DIAGNOSIS — C3492 Malignant neoplasm of unspecified part of left bronchus or lung: Secondary | ICD-10-CM

## 2014-09-09 MED ORDER — IOHEXOL 300 MG/ML  SOLN
100.0000 mL | Freq: Once | INTRAMUSCULAR | Status: AC | PRN
  Administered 2014-09-09: 100 mL via INTRAVENOUS

## 2014-09-13 ENCOUNTER — Ambulatory Visit (HOSPITAL_BASED_OUTPATIENT_CLINIC_OR_DEPARTMENT_OTHER): Payer: Federal, State, Local not specified - PPO | Admitting: Internal Medicine

## 2014-09-13 ENCOUNTER — Encounter: Payer: Self-pay | Admitting: Internal Medicine

## 2014-09-13 ENCOUNTER — Other Ambulatory Visit (HOSPITAL_BASED_OUTPATIENT_CLINIC_OR_DEPARTMENT_OTHER): Payer: Federal, State, Local not specified - PPO

## 2014-09-13 VITALS — BP 126/71 | HR 81 | Temp 98.2°F | Resp 18 | Ht 73.0 in | Wt 233.5 lb

## 2014-09-13 DIAGNOSIS — C3490 Malignant neoplasm of unspecified part of unspecified bronchus or lung: Secondary | ICD-10-CM

## 2014-09-13 DIAGNOSIS — C3492 Malignant neoplasm of unspecified part of left bronchus or lung: Secondary | ICD-10-CM

## 2014-09-13 DIAGNOSIS — C7A1 Malignant poorly differentiated neuroendocrine tumors: Secondary | ICD-10-CM

## 2014-09-13 DIAGNOSIS — C7B8 Other secondary neuroendocrine tumors: Secondary | ICD-10-CM

## 2014-09-13 LAB — CBC WITH DIFFERENTIAL/PLATELET
BASO%: 0.3 % (ref 0.0–2.0)
BASOS ABS: 0 10*3/uL (ref 0.0–0.1)
EOS ABS: 1.1 10*3/uL — AB (ref 0.0–0.5)
EOS%: 18.5 % — AB (ref 0.0–7.0)
HCT: 42.6 % (ref 38.4–49.9)
HEMOGLOBIN: 13.7 g/dL (ref 13.0–17.1)
LYMPH%: 14.7 % (ref 14.0–49.0)
MCH: 32 pg (ref 27.2–33.4)
MCHC: 32.2 g/dL (ref 32.0–36.0)
MCV: 99.7 fL — AB (ref 79.3–98.0)
MONO#: 0.6 10*3/uL (ref 0.1–0.9)
MONO%: 10.6 % (ref 0.0–14.0)
NEUT#: 3.3 10*3/uL (ref 1.5–6.5)
NEUT%: 55.9 % (ref 39.0–75.0)
Platelets: 245 10*3/uL (ref 140–400)
RBC: 4.27 10*6/uL (ref 4.20–5.82)
RDW: 13.7 % (ref 11.0–14.6)
WBC: 5.9 10*3/uL (ref 4.0–10.3)
lymph#: 0.9 10*3/uL (ref 0.9–3.3)

## 2014-09-13 LAB — COMPREHENSIVE METABOLIC PANEL (CC13)
ALK PHOS: 84 U/L (ref 40–150)
ALT: 12 U/L (ref 0–55)
ANION GAP: 10 meq/L (ref 3–11)
AST: 20 U/L (ref 5–34)
Albumin: 3.7 g/dL (ref 3.5–5.0)
BUN: 8.8 mg/dL (ref 7.0–26.0)
CHLORIDE: 103 meq/L (ref 98–109)
CO2: 30 meq/L — AB (ref 22–29)
CREATININE: 0.9 mg/dL (ref 0.7–1.3)
Calcium: 10.1 mg/dL (ref 8.4–10.4)
EGFR: >90 mL/min/{1.73_m2} (ref >90)
GLUCOSE: 104 mg/dL (ref 70–140)
POTASSIUM: 3.8 meq/L (ref 3.5–5.1)
SODIUM: 143 meq/L (ref 136–145)
Total Bilirubin: 0.33 mg/dL (ref 0.20–1.20)
Total Protein: 7.5 g/dL (ref 6.4–8.3)

## 2014-09-13 NOTE — Progress Notes (Signed)
Kerry Peck [phone]   Fax:([phone]) [place], [place] [address] [place] [address]  DIAGNOSIS: Extensive stage, Stage IV (T2a, N1, M1b) small cell lung cancer diagnosed in July of 2015.  PRIOR THERAPY:  1) Systemic chemotherapy with carboplatin for AUC of 5 on day 1 and etoposide at 120 mg/M2 on days 1, 2 and 3 with Neulasta support on day 4. Status post 6 cycles.  2) prophylactic cranial irradiation under the care of Dr. Sondra Come. 3) palliative radiotherapy to the T7-spine completed on 09/06/2014.  CURRENT THERAPY: Observation.  INTERVAL HISTORY: Kerry Peck 54 y.o. male returns to the clinic today for follow up visit. The patient has been observation for the last 2 months. During that time he received prophylactic cranial irradiation in addition to palliative radiotherapy to the T7 vertebral lesion. He denied having any significant fever or chills, no nausea or vomiting. He denied having any significant weight loss or night sweats. The patient has no significant chest pain but continues to have shortness of breath with exertion with no cough or hemoptysis. He also complains of right hip pain. He had repeat CT scan of the chest, abdomen and pelvis performed recently and he is here for evaluation and discussion of his scan results.  MEDICAL HISTORY: Past Medical History  Diagnosis Date  . Diabetes mellitus without complication   . Hypertension   . Smoking addiction   . Cancer     lung and liver    ALLERGIES:  has No Known Allergies.  MEDICATIONS:  Current Outpatient Prescriptions  Medication Sig Dispense Refill  . albuterol (PROVENTIL HFA;VENTOLIN HFA) 108 (90 BASE) MCG/ACT inhaler Inhale 2 puffs into the lungs every 6 (six) hours as needed for wheezing. 1 Inhaler 0  . amLODipine (NORVASC) 10 MG tablet Take 10 mg by mouth daily.    Marland Kitchen aspirin EC 81 MG tablet Take 81 mg by mouth daily.      Marland Kitchen atorvastatin (LIPITOR) 40 MG tablet Take 40 mg by mouth daily.    . Blood Pressure Monitoring (5 SERIES BP MONITOR) DEVI     . calcium-vitamin D (OSCAL WITH D) 500-200 MG-UNIT per tablet Take 1 tablet by mouth.    . emollient (BIAFINE) cream Apply topically as needed. 454 g 0  . fenofibrate 160 MG tablet Take 160 mg by mouth daily.    . Fluticasone-Salmeterol (ADVAIR) 250-50 MCG/DOSE AEPB Inhale 1 puff into the lungs every 12 (twelve) hours.    Marland Kitchen glipiZIDE (GLUCOTROL) 5 MG tablet Take 5 mg by mouth daily.    . metFORMIN (GLUCOPHAGE) 500 MG tablet Take 1,000 mg by mouth 2 (two) times daily with a meal.     . metoprolol tartrate (LOPRESSOR) 25 MG tablet Take 25 mg by mouth daily.     . Multiple Vitamins-Minerals (CENTRUM SILVER ADULT 50+ PO) Take by mouth.    . ONE TOUCH ULTRA TEST test strip     . pantoprazole (PROTONIX) 40 MG tablet Take 40 mg by mouth as needed.     . prochlorperazine (COMPAZINE) 10 MG tablet Take 1 tablet (10 mg total) by mouth every 6 (six) hours as needed for nausea or vomiting. 30 tablet 0  . pyridOXINE (VITAMIN B-6) 100 MG tablet Take 100 mg by mouth daily.    . vitamin C (ASCORBIC ACID) 500 MG tablet Take 500 mg by mouth daily.     No current facility-administered medications for  this visit.    REVIEW OF SYSTEMS:  Constitutional: positive for fatigue Eyes: negative Ears, nose, mouth, throat, and face: negative Respiratory: positive for dyspnea on exertion Cardiovascular: negative Gastrointestinal: negative Genitourinary:negative Integument/breast: negative Hematologic/lymphatic: negative Musculoskeletal:negative Neurological: negative Behavioral/Psych: negative Endocrine: negative Allergic/Immunologic: negative   PHYSICAL EXAMINATION: General appearance: alert, cooperative and no distress Head: Normocephalic, without obvious abnormality, atraumatic Neck: no adenopathy, no JVD, supple, symmetrical, trachea midline and thyroid not enlarged, symmetric, no  tenderness/mass/nodules Lymph nodes: Cervical, supraclavicular, and axillary nodes normal. Resp: clear to auscultation bilaterally Back: symmetric, no curvature. ROM normal. No CVA tenderness. Cardio: regular rate and rhythm, S1, S2 normal, no murmur, click, rub or gallop GI: soft, non-tender; bowel sounds normal; no masses,  no organomegaly Extremities: extremities normal, atraumatic, no cyanosis or edema Neurologic: Alert and oriented X 3, normal strength and tone. Normal symmetric reflexes. Normal coordination and gait  ECOG PERFORMANCE STATUS: 1 - Symptomatic but completely ambulatory  Blood pressure 126/71, pulse 81, temperature 98.2 F (36.8 C), temperature source Oral, resp. rate 18, height 6\' 1"  (1.854 m), weight 233 lb 8 oz (105.915 kg), SpO2 95 %.  LABORATORY DATA: Lab Results  Component Value Date   WBC 5.9 09/13/2014   HGB 13.7 09/13/2014   HCT 42.6 09/13/2014   MCV 99.7* 09/13/2014   PLT 245 09/13/2014      Chemistry      Component Value Date/Time   NA 141 07/12/2014 1319   NA 136 04/05/2014 1338   K 3.5 07/12/2014 1319   K 3.9 04/05/2014 1338   CL 101 04/05/2014 1338   CO2 26 07/12/2014 1319   CO2 27 04/05/2014 1338   BUN 6.9* 07/12/2014 1319   BUN 10 04/05/2014 1338   CREATININE 0.8 07/12/2014 1319   CREATININE 0.64 04/05/2014 1338      Component Value Date/Time   CALCIUM 9.4 07/12/2014 1319   CALCIUM 8.9 04/05/2014 1338   ALKPHOS 92 07/12/2014 1319   ALKPHOS 139* 04/05/2014 1338   AST 27 07/12/2014 1319   AST 13 04/05/2014 1338   ALT 29 07/12/2014 1319   ALT 17 04/05/2014 1338   BILITOT 0.40 07/12/2014 1319   BILITOT 0.5 04/05/2014 1338       RADIOGRAPHIC STUDIES: Ct Chest W Contrast  09/09/2014   CLINICAL DATA:  Subsequent evaluation of a 54 year old male with history of lung cancer and liver metastases, originally diagnosed in May 2015 status post chemotherapy and radiation therapy (radiation therapy completed January 2016).  EXAM: CT CHEST,  ABDOMEN, AND PELVIS WITH CONTRAST  TECHNIQUE: Multidetector CT imaging of the chest, abdomen and pelvis was performed following the standard protocol during bolus administration of intravenous contrast.  CONTRAST:  167mL OMNIPAQUE IOHEXOL 300 MG/ML  SOLN  COMPARISON:  CT of the chest, abdomen and pelvis 07/07/2014  FINDINGS: CT CHEST FINDINGS  Mediastinum/Lymph Nodes: Enlarging left hilar lymph nodes measuring up to 2.5 x 2.2 cm (image 33 of series 2). Other smaller left infrahilar lymph nodes are also noted. No additional mediastinal, right hilar or axillary lymphadenopathy noted. Heart size is normal. There is no significant pericardial fluid, thickening or pericardial calcification. There is atherosclerosis of the thoracic aorta, the great vessels of the mediastinum and the coronary arteries, including calcified atherosclerotic plaque in the left anterior descending, left circumflex and right coronary arteries. Esophagus is unremarkable in appearance.  Lungs/Pleura: Patchy left lower lobe nodule is essentially stable in size measuring 2.2 x 1.4 cm, with a small amount of adjacent architectural distortion in ground-glass  attenuation, presumably mild postradiation changes. No other new suspicious appearing pulmonary nodules or masses are otherwise noted. No acute consolidative airspace disease. No pleural effusions. Mild diffuse bronchial wall thickening.  Musculoskeletal/Soft Tissues: Again noted are multifocal lytic and sclerotic lesions scattered throughout the visualized axial skeleton, most pronounced in the thoracic spine, with similar appearing lesions at T4, T5, T6, T9 and T11. At T6, there is a pathologic compression fracture with approximately 25% loss of anterior vertebral body height which appears similar to the prior examination. Old compression fractures at T11 and T12 are also unchanged, most severe T12 where there is approximately 50% loss of height (this is likely a benign compression fracture). No  definite new osseous lesions.  CT ABDOMEN AND PELVIS FINDINGS  Hepatobiliary: No discrete cystic or solid hepatic lesions. No intra or extrahepatic biliary ductal dilatation. 3 mm calcified gallstone in the gallbladder. No current findings to suggest acute cholecystitis at this time.  Pancreas: Unremarkable.  Spleen: Unremarkable.  Adrenals/Urinary Tract: Bilateral kidneys and bilateral adrenal glands are normal in appearance. No hydroureteronephrosis. Urinary bladder is normal in appearance.  Stomach/Bowel: Normal appearance of the stomach. No pathologic dilatation of small bowel or colon. Normal appendix.  Vascular/Lymphatic: Moderate atherosclerotic disease throughout the abdominal and pelvic vasculature, without evidence of aneurysm or dissection. No lymphadenopathy noted in the abdomen or pelvis.  Reproductive: Prostate gland and seminal vesicles are unremarkable in appearance.  Other: No significant volume of ascites.  No pneumoperitoneum.  Musculoskeletal: Numerous mixed lytic and sclerotic lesions again noted throughout the axial and appendicular skeleton, compatible with metastatic disease. This includes a predominantly lytic lesion in the right femoral neck which measures approximately 2 cm in diameter, which is essentially unchanged, as well as a ill-defined lytic lesion in the right ilium which measures up to 4.0 x 2.0 cm, with some associated endosteal scalloping, which does appear increased compared to the prior examination.  IMPRESSION: 1. Overall today's study demonstrates progression of disease. Specifically, although the left lower lobe pulmonary nodule is stable in size, there is new left hilar lymphadenopathy, and there is an increasingly prominent lytic lesion in the right ilium, as detailed above. Multiple other osseous metastases appear generally similar compared to the prior examination. No additional evidence of metastatic disease is to the abdomen or pelvis is otherwise noted. 2.  Cholelithiasis without evidence of acute cholecystitis at this time. 3.  Atherosclerosis, including 3 vessel coronary artery disease. 4. Additional incidental findings, as above.   Electronically Signed   By: Vinnie Langton M.D.   On: 09/09/2014 10:43   Ct Abdomen Pelvis W Contrast  09/09/2014   CLINICAL DATA:  Subsequent evaluation of a 54 year old male with history of lung cancer and liver metastases, originally diagnosed in May 2015 status post chemotherapy and radiation therapy (radiation therapy completed January 2016).  EXAM: CT CHEST, ABDOMEN, AND PELVIS WITH CONTRAST  TECHNIQUE: Multidetector CT imaging of the chest, abdomen and pelvis was performed following the standard protocol during bolus administration of intravenous contrast.  CONTRAST:  137mL OMNIPAQUE IOHEXOL 300 MG/ML  SOLN  COMPARISON:  CT of the chest, abdomen and pelvis 07/07/2014  FINDINGS: CT CHEST FINDINGS  Mediastinum/Lymph Nodes: Enlarging left hilar lymph nodes measuring up to 2.5 x 2.2 cm (image 33 of series 2). Other smaller left infrahilar lymph nodes are also noted. No additional mediastinal, right hilar or axillary lymphadenopathy noted. Heart size is normal. There is no significant pericardial fluid, thickening or pericardial calcification. There is atherosclerosis of the thoracic aorta, the  great vessels of the mediastinum and the coronary arteries, including calcified atherosclerotic plaque in the left anterior descending, left circumflex and right coronary arteries. Esophagus is unremarkable in appearance.  Lungs/Pleura: Patchy left lower lobe nodule is essentially stable in size measuring 2.2 x 1.4 cm, with a small amount of adjacent architectural distortion in ground-glass attenuation, presumably mild postradiation changes. No other new suspicious appearing pulmonary nodules or masses are otherwise noted. No acute consolidative airspace disease. No pleural effusions. Mild diffuse bronchial wall thickening.   Musculoskeletal/Soft Tissues: Again noted are multifocal lytic and sclerotic lesions scattered throughout the visualized axial skeleton, most pronounced in the thoracic spine, with similar appearing lesions at T4, T5, T6, T9 and T11. At T6, there is a pathologic compression fracture with approximately 25% loss of anterior vertebral body height which appears similar to the prior examination. Old compression fractures at T11 and T12 are also unchanged, most severe T12 where there is approximately 50% loss of height (this is likely a benign compression fracture). No definite new osseous lesions.  CT ABDOMEN AND PELVIS FINDINGS  Hepatobiliary: No discrete cystic or solid hepatic lesions. No intra or extrahepatic biliary ductal dilatation. 3 mm calcified gallstone in the gallbladder. No current findings to suggest acute cholecystitis at this time.  Pancreas: Unremarkable.  Spleen: Unremarkable.  Adrenals/Urinary Tract: Bilateral kidneys and bilateral adrenal glands are normal in appearance. No hydroureteronephrosis. Urinary bladder is normal in appearance.  Stomach/Bowel: Normal appearance of the stomach. No pathologic dilatation of small bowel or colon. Normal appendix.  Vascular/Lymphatic: Moderate atherosclerotic disease throughout the abdominal and pelvic vasculature, without evidence of aneurysm or dissection. No lymphadenopathy noted in the abdomen or pelvis.  Reproductive: Prostate gland and seminal vesicles are unremarkable in appearance.  Other: No significant volume of ascites.  No pneumoperitoneum.  Musculoskeletal: Numerous mixed lytic and sclerotic lesions again noted throughout the axial and appendicular skeleton, compatible with metastatic disease. This includes a predominantly lytic lesion in the right femoral neck which measures approximately 2 cm in diameter, which is essentially unchanged, as well as a ill-defined lytic lesion in the right ilium which measures up to 4.0 x 2.0 cm, with some associated  endosteal scalloping, which does appear increased compared to the prior examination.  IMPRESSION: 1. Overall today's study demonstrates progression of disease. Specifically, although the left lower lobe pulmonary nodule is stable in size, there is new left hilar lymphadenopathy, and there is an increasingly prominent lytic lesion in the right ilium, as detailed above. Multiple other osseous metastases appear generally similar compared to the prior examination. No additional evidence of metastatic disease is to the abdomen or pelvis is otherwise noted. 2. Cholelithiasis without evidence of acute cholecystitis at this time. 3.  Atherosclerosis, including 3 vessel coronary artery disease. 4. Additional incidental findings, as above.   Electronically Signed   By: Vinnie Langton M.D.   On: 09/09/2014 10:43   ASSESSMENT AND PLAN: this is a very pleasant 54 years old white male recently diagnosed with extensive stage small cell lung cancer status post 6 cycles of systemic chemotherapy with carboplatin and etoposide with partial response. This was followed by prophylactic cranial irradiation in addition to palliative radiotherapy to the T7 thoracic spine and the chest. The recent CT scan of the chest, abdomen and pelvis showed evidence for disease progression with new left hilar lymphadenopathy in addition to prominent lytic lesion in the right ilium which are probably the cause of his right hip pain. I discussed the scan results and showed the  images to the patient today. I recommended for him to see Dr. Sondra Come to again for evaluation and consideration of palliative radiotherapy to the 2 areas of progressive disease. I would see the patient back for follow-up visit in 2 months for reevaluation after repeating CT scan of the chest, abdomen and pelvis. If he has any evidence for further disease progression on the upcoming scan, I would consider him for systemic therapy with second line chemotherapy. The patient was  advised to call immediately if he has any concerning symptoms in the interval. The patient voices understanding of current disease status and treatment options and is in agreement with the current care plan.  All questions were answered. The patient knows to call the clinic with any problems, questions or concerns. We can certainly see the patient much sooner if necessary.  Disclaimer: This note was dictated with voice recognition software. Similar sounding words can inadvertently be transcribed and may not be corrected upon review.

## 2014-09-15 ENCOUNTER — Telehealth: Payer: Self-pay | Admitting: *Deleted

## 2014-09-15 ENCOUNTER — Other Ambulatory Visit: Payer: Self-pay | Admitting: Radiation Oncology

## 2014-09-15 MED ORDER — SUCRALFATE 1 GM/10ML PO SUSP: 1.0000 g | Freq: Three times a day (TID) | ORAL | Status: DC

## 2014-09-15 MED ORDER — HYDROCODONE-ACETAMINOPHEN 5-325 MG PO TABS: 1.0000 | ORAL_TABLET | ORAL | Status: DC | PRN

## 2014-09-15 NOTE — Telephone Encounter (Signed)
Pt called into nursing wanting to confirm his appointment on January 27th at 1pm.  He also is reporting a lot of heartburn that's "real bad" and stating that Dr. Sondra Come told him to call in so he could get a prescription.  In addition he is reporting pain of a 7-8 over his right hip that is described as burning and constant.  He would like pain medication for that as well.  He reports his pharmacy is [facility] on [address], [place].

## 2014-09-15 NOTE — Telephone Encounter (Signed)
Called pt to notify that Dr. Sondra Come called in Carafate 5ml four times a day, to take one hr before meals and at bedtime.  Also informed him that he has a prescription to be picked up at the cancer center (radiation oncology) for pain and to bring his ID.  Instructed to called [phone], nursing if he has any further questions.

## 2014-09-19 ENCOUNTER — Encounter: Payer: Self-pay | Admitting: Radiation Oncology

## 2014-09-19 NOTE — Progress Notes (Signed)
Histology and Location of Primary Cancer: Stage IV (T2a, N1, M1b) small cell lung cancer diagnosed in July of 2015.  Sites of Visceral and Bony Metastatic Disease: Left lower lobe nodule, left hilar lymphadenopathy, thoracic spine, right femoral neck and  prominent lytic lesion in the right ilium  Location(s) of Symptomatic Metastases: left hilar lymphadenopathy and lytic lesion in the right ilium.  Past/Anticipated chemotherapy by medical oncology, if any: Systemic chemotherapy with carboplatin for AUC of 5 on day 1 and etoposide at 120 mg/M2 on days 1, 2 and 3 with Neulasta support on day 4. Status post 6 cycles.  Per Dr. Julien Nordmann - "I would see the patient back for follow-up visit in 2 months for reevaluation after repeating CT scan of the chest, abdomen and pelvis. If he has any evidence for further disease progression on the upcoming scan, I would consider him for systemic therapy with second line chemotherapy."  Pain on a scale of 0-10 is: 7 in his right side.  He reports his right hip was hurting and now has moved up to his right side, underneath his ribs.  He is taking norco 1 tablet q 4 hours and says it only works for an hour.  He is also taking aleve 2 tablets bid.   If Spine Met(s), symptoms, if any, include:  Bowel/Bladder retention or incontinence (please describe): no  Numbness or weakness in extremities (please describe): no  Current Decadron regimen, if applicable: no  Ambulatory status? Walker? Wheelchair?: Ambulatory  SAFETY ISSUES:  Prior radiation? yes, 08/16/14-09/06/14 - prophylactic cranial radiation 25 gray, chest, t-spine  35 gray  Pacemaker/ICD? no  Possible current pregnancy? no  Is the patient on methotrexate? no  Current Complaints / other details:  Kerry Peck reports the feeling like food getting stuck in his chest is better.  He is taking Carafate 4 times a day.  He has not worked since last week.  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

## 2014-09-21 ENCOUNTER — Ambulatory Visit
Admission: RE | Admit: 2014-09-21 | Discharge: 2014-09-21 | Disposition: A | Discharge status: Home / Self Care | Payer: Federal, State, Local not specified - PPO | Source: Ambulatory Visit | Attending: Radiation Oncology | Admitting: Radiation Oncology

## 2014-09-21 ENCOUNTER — Encounter: Payer: Self-pay | Admitting: Radiation Oncology

## 2014-09-21 VITALS — BP 126/85 | HR 109 | Temp 99.0°F | Resp 16 | Ht 73.0 in | Wt 236.1 lb

## 2014-09-21 DIAGNOSIS — Z87891 Personal history of nicotine dependence: Secondary | ICD-10-CM | POA: Diagnosis not present

## 2014-09-21 DIAGNOSIS — C3492 Malignant neoplasm of unspecified part of left bronchus or lung: Secondary | ICD-10-CM | POA: Diagnosis not present

## 2014-09-21 HISTORY — DX: Reserved for concepts with insufficient information to code with codable children: IMO0002

## 2014-09-21 HISTORY — DX: Reserved for inherently not codable concepts without codable children: IMO0001

## 2014-09-21 MED ORDER — OXYCODONE-ACETAMINOPHEN 5-325 MG PO TABS: 1.0000 | ORAL_TABLET | ORAL | Status: DC | PRN

## 2014-09-21 NOTE — Progress Notes (Signed)
Radiation Oncology         [phone] ________________________________  Name: Michae Grimley MRN: [id number]  Date: 09/21/2014  DOB: 06-Feb-1961  Re- evaluation Note  CC: Lujean Amel, MD  Curt Bears, MD    ICD-9-CM ICD-10-CM   1. Small cell lung cancer, left 162.9 C34.92 Ambulatory referral to Social Work    Diagnosis:   Extensive stage small cell lung cancer  Interval Since Last Radiation:  2  weeks  Narrative:  The patient returns today for further evaluation at the courtesy of Dr. Julien Nordmann. The patient recently completed palliative radiation therapy to the thoracic spine and his primary left lung lesion 2 weeks ago. Over the past several days the patient's had increasing pain in his right pelvis. He is also morerecentlyalongtherightloweranteriorribcagearea.Thepatientrecentlycompletedre-imagingwhichshowedprogressionofdiseaseinthelefthilarregion.Thereisalsoalyticlesionnotedintheright ilium. For these issues the patient is now seen in radiation oncology for consideration for additional palliative treatment area.                             ALLERGIES:  has No Known Allergies.  Meds: Current Outpatient Prescriptions  Medication Sig Dispense Refill  . albuterol (PROVENTIL HFA;VENTOLIN HFA) 108 (90 BASE) MCG/ACT inhaler Inhale 2 puffs into the lungs every 6 (six) hours as needed for wheezing. 1 Inhaler 0  . amLODipine (NORVASC) 10 MG tablet Take 10 mg by mouth daily.    Marland Kitchen aspirin EC 81 MG tablet Take 81 mg by mouth daily.    Marland Kitchen atorvastatin (LIPITOR) 40 MG tablet Take 40 mg by mouth daily.    . Blood Pressure Monitoring (5 SERIES BP MONITOR) DEVI     . calcium-vitamin D (OSCAL WITH D) 500-200 MG-UNIT per tablet Take 1 tablet by mouth.    . esomeprazole (NEXIUM) 20 MG capsule Take 20 mg by mouth daily as needed.    . fenofibrate 160 MG tablet Take 160 mg by mouth daily.    . Fluticasone-Salmeterol (ADVAIR) 250-50 MCG/DOSE AEPB Inhale 1 puff into the lungs every 12 (twelve)  hours.    Marland Kitchen glipiZIDE (GLUCOTROL) 5 MG tablet Take 5 mg by mouth daily.    Marland Kitchen HYDROcodone-acetaminophen (NORCO/VICODIN) 5-325 MG per tablet Take 1 tablet by mouth every 4 (four) hours as needed for moderate pain. 30 tablet 0  . metFORMIN (GLUCOPHAGE) 500 MG tablet Take 1,000 mg by mouth 2 (two) times daily with a meal.     . metoprolol tartrate (LOPRESSOR) 25 MG tablet Take 25 mg by mouth daily.     . Multiple Vitamins-Minerals (CENTRUM SILVER ADULT 50+ PO) Take by mouth.    . naproxen sodium (ANAPROX) 220 MG tablet Take 220 mg by mouth 2 (two) times daily with a meal. Takes 2 tablets bid    . ONE TOUCH ULTRA TEST test strip     . prochlorperazine (COMPAZINE) 10 MG tablet Take 1 tablet (10 mg total) by mouth every 6 (six) hours as needed for nausea or vomiting. 30 tablet 0  . pyridOXINE (VITAMIN B-6) 100 MG tablet Take 100 mg by mouth daily.    . sucralfate (CARAFATE) 1 GM/10ML suspension Take 10 mLs (1 g total) by mouth 4 (four) times daily -  with meals and at bedtime. 420 mL 1  . vitamin C (ASCORBIC ACID) 500 MG tablet Take 500 mg by mouth daily.    Marland Kitchen emollient (BIAFINE) cream Apply topically as needed. (Patient not taking: Reported on 09/21/2014) 454 g 0  . oxyCODONE-acetaminophen (PERCOCET/ROXICET) 5-325 MG per tablet Take 1 tablet  by mouth every 4 (four) hours as needed for severe pain. 30 tablet 0  . pantoprazole (PROTONIX) 40 MG tablet Take 40 mg by mouth as needed.      No current facility-administered medications for this encounter.    Physical Findings: The patient is in no acute distress. Patient is alert and oriented.  height is 6\' 1"  (1.854 m) and weight is 236 lb 1.6 oz (107.094 kg). His oral temperature is 99 F (37.2 C). His blood pressure is 126/85 and his pulse is 109. His respiration is 16 and oxygen saturation is 93%. . No palpable subclavicular or axillary adenopathy. The lungs are clear to auscultation. The heart has a regular rhythm and rate. Palpation along the right  lower anterior rib cage area reveals tenderness but no palpable swelling. Palpation along the right pelvis region reveals no obvious point tenderness.  Lab Findings: Lab Results  Component Value Date   WBC 5.9 09/13/2014   HGB 13.7 09/13/2014   HCT 42.6 09/13/2014   MCV 99.7* 09/13/2014   PLT 245 09/13/2014    Radiographic Findings: Dg Chest 2 View  08/23/2014   CLINICAL DATA:  Cough and wheezing.  History of lung carcinoma  EXAM: CHEST  2 VIEW  COMPARISON:  Chest CT July 07, 2014; chest radiograph Jan 16, 2014  FINDINGS: Left lower lobe mass currently measures 1.8 x 1.3 cm, slightly smaller compared to recent prior study. Elsewhere lungs are clear. Heart size and pulmonary vascularity are normal. No adenopathy. There is anterior wedging of a lower thoracic vertebral body, stable. Bony metastatic disease in mid thoracic region is present, better seen on CT.  IMPRESSION: Left lower lobe lung mass slightly smaller. Bony metastatic lesions present but better seen on CT. No lung edema or consolidation.   Electronically Signed   By: Lowella Grip M.D.   On: 08/23/2014 16:30   Ct Chest W Contrast  09/09/2014   CLINICAL DATA:  Subsequent evaluation of a 54 year old male with history of lung cancer and liver metastases, originally diagnosed in May 2015 status post chemotherapy and radiation therapy (radiation therapy completed January 2016).  EXAM: CT CHEST, ABDOMEN, AND PELVIS WITH CONTRAST  TECHNIQUE: Multidetector CT imaging of the chest, abdomen and pelvis was performed following the standard protocol during bolus administration of intravenous contrast.  CONTRAST:  130mL OMNIPAQUE IOHEXOL 300 MG/ML  SOLN  COMPARISON:  CT of the chest, abdomen and pelvis 07/07/2014  FINDINGS: CT CHEST FINDINGS  Mediastinum/Lymph Nodes: Enlarging left hilar lymph nodes measuring up to 2.5 x 2.2 cm (image 33 of series 2). Other smaller left infrahilar lymph nodes are also noted. No additional mediastinal, right  hilar or axillary lymphadenopathy noted. Heart size is normal. There is no significant pericardial fluid, thickening or pericardial calcification. There is atherosclerosis of the thoracic aorta, the great vessels of the mediastinum and the coronary arteries, including calcified atherosclerotic plaque in the left anterior descending, left circumflex and right coronary arteries. Esophagus is unremarkable in appearance.  Lungs/Pleura: Patchy left lower lobe nodule is essentially stable in size measuring 2.2 x 1.4 cm, with a small amount of adjacent architectural distortion in ground-glass attenuation, presumably mild postradiation changes. No other new suspicious appearing pulmonary nodules or masses are otherwise noted. No acute consolidative airspace disease. No pleural effusions. Mild diffuse bronchial wall thickening.  Musculoskeletal/Soft Tissues: Again noted are multifocal lytic and sclerotic lesions scattered throughout the visualized axial skeleton, most pronounced in the thoracic spine, with similar appearing lesions at T4, T5, T6, T9  and T11. At T6, there is a pathologic compression fracture with approximately 25% loss of anterior vertebral body height which appears similar to the prior examination. Old compression fractures at T11 and T12 are also unchanged, most severe T12 where there is approximately 50% loss of height (this is likely a benign compression fracture). No definite new osseous lesions.  CT ABDOMEN AND PELVIS FINDINGS  Hepatobiliary: No discrete cystic or solid hepatic lesions. No intra or extrahepatic biliary ductal dilatation. 3 mm calcified gallstone in the gallbladder. No current findings to suggest acute cholecystitis at this time.  Pancreas: Unremarkable.  Spleen: Unremarkable.  Adrenals/Urinary Tract: Bilateral kidneys and bilateral adrenal glands are normal in appearance. No hydroureteronephrosis. Urinary bladder is normal in appearance.  Stomach/Bowel: Normal appearance of the stomach.  No pathologic dilatation of small bowel or colon. Normal appendix.  Vascular/Lymphatic: Moderate atherosclerotic disease throughout the abdominal and pelvic vasculature, without evidence of aneurysm or dissection. No lymphadenopathy noted in the abdomen or pelvis.  Reproductive: Prostate gland and seminal vesicles are unremarkable in appearance.  Other: No significant volume of ascites.  No pneumoperitoneum.  Musculoskeletal: Numerous mixed lytic and sclerotic lesions again noted throughout the axial and appendicular skeleton, compatible with metastatic disease. This includes a predominantly lytic lesion in the right femoral neck which measures approximately 2 cm in diameter, which is essentially unchanged, as well as a ill-defined lytic lesion in the right ilium which measures up to 4.0 x 2.0 cm, with some associated endosteal scalloping, which does appear increased compared to the prior examination.  IMPRESSION: 1. Overall today's study demonstrates progression of disease. Specifically, although the left lower lobe pulmonary nodule is stable in size, there is new left hilar lymphadenopathy, and there is an increasingly prominent lytic lesion in the right ilium, as detailed above. Multiple other osseous metastases appear generally similar compared to the prior examination. No additional evidence of metastatic disease is to the abdomen or pelvis is otherwise noted. 2. Cholelithiasis without evidence of acute cholecystitis at this time. 3.  Atherosclerosis, including 3 vessel coronary artery disease. 4. Additional incidental findings, as above.   Electronically Signed   By: Vinnie Langton M.D.   On: 09/09/2014 10:43   Ct Abdomen Pelvis W Contrast  09/09/2014   CLINICAL DATA:  Subsequent evaluation of a 54 year old male with history of lung cancer and liver metastases, originally diagnosed in May 2015 status post chemotherapy and radiation therapy (radiation therapy completed January 2016).  EXAM: CT CHEST,  ABDOMEN, AND PELVIS WITH CONTRAST  TECHNIQUE: Multidetector CT imaging of the chest, abdomen and pelvis was performed following the standard protocol during bolus administration of intravenous contrast.  CONTRAST:  112mL OMNIPAQUE IOHEXOL 300 MG/ML  SOLN  COMPARISON:  CT of the chest, abdomen and pelvis 07/07/2014  FINDINGS: CT CHEST FINDINGS  Mediastinum/Lymph Nodes: Enlarging left hilar lymph nodes measuring up to 2.5 x 2.2 cm (image 33 of series 2). Other smaller left infrahilar lymph nodes are also noted. No additional mediastinal, right hilar or axillary lymphadenopathy noted. Heart size is normal. There is no significant pericardial fluid, thickening or pericardial calcification. There is atherosclerosis of the thoracic aorta, the great vessels of the mediastinum and the coronary arteries, including calcified atherosclerotic plaque in the left anterior descending, left circumflex and right coronary arteries. Esophagus is unremarkable in appearance.  Lungs/Pleura: Patchy left lower lobe nodule is essentially stable in size measuring 2.2 x 1.4 cm, with a small amount of adjacent architectural distortion in ground-glass attenuation, presumably mild postradiation changes. No  other new suspicious appearing pulmonary nodules or masses are otherwise noted. No acute consolidative airspace disease. No pleural effusions. Mild diffuse bronchial wall thickening.  Musculoskeletal/Soft Tissues: Again noted are multifocal lytic and sclerotic lesions scattered throughout the visualized axial skeleton, most pronounced in the thoracic spine, with similar appearing lesions at T4, T5, T6, T9 and T11. At T6, there is a pathologic compression fracture with approximately 25% loss of anterior vertebral body height which appears similar to the prior examination. Old compression fractures at T11 and T12 are also unchanged, most severe T12 where there is approximately 50% loss of height (this is likely a benign compression fracture). No  definite new osseous lesions.  CT ABDOMEN AND PELVIS FINDINGS  Hepatobiliary: No discrete cystic or solid hepatic lesions. No intra or extrahepatic biliary ductal dilatation. 3 mm calcified gallstone in the gallbladder. No current findings to suggest acute cholecystitis at this time.  Pancreas: Unremarkable.  Spleen: Unremarkable.  Adrenals/Urinary Tract: Bilateral kidneys and bilateral adrenal glands are normal in appearance. No hydroureteronephrosis. Urinary bladder is normal in appearance.  Stomach/Bowel: Normal appearance of the stomach. No pathologic dilatation of small bowel or colon. Normal appendix.  Vascular/Lymphatic: Moderate atherosclerotic disease throughout the abdominal and pelvic vasculature, without evidence of aneurysm or dissection. No lymphadenopathy noted in the abdomen or pelvis.  Reproductive: Prostate gland and seminal vesicles are unremarkable in appearance.  Other: No significant volume of ascites.  No pneumoperitoneum.  Musculoskeletal: Numerous mixed lytic and sclerotic lesions again noted throughout the axial and appendicular skeleton, compatible with metastatic disease. This includes a predominantly lytic lesion in the right femoral neck which measures approximately 2 cm in diameter, which is essentially unchanged, as well as a ill-defined lytic lesion in the right ilium which measures up to 4.0 x 2.0 cm, with some associated endosteal scalloping, which does appear increased compared to the prior examination.  IMPRESSION: 1. Overall today's study demonstrates progression of disease. Specifically, although the left lower lobe pulmonary nodule is stable in size, there is new left hilar lymphadenopathy, and there is an increasingly prominent lytic lesion in the right ilium, as detailed above. Multiple other osseous metastases appear generally similar compared to the prior examination. No additional evidence of metastatic disease is to the abdomen or pelvis is otherwise noted. 2.  Cholelithiasis without evidence of acute cholecystitis at this time. 3.  Atherosclerosis, including 3 vessel coronary artery disease. 4. Additional incidental findings, as above.   Electronically Signed   By: Vinnie Langton M.D.   On: 09/09/2014 10:43    Impression:  Progressive small cell lung cancer. The patient is having pain in his right pelvis area which is likely related to his progressive ill-defined lytic lesion within the right ilium. As above on imaging he has progression in the left hilar area. I'm unsure of the etiology of the patient's pain along the right lower anterior rib cage area but will look at this area carefully during the patient's treatment planning CT scan. Patient would be a candidate for additional palliative radiation therapy record at the right pelvis and left hilar area. I discussed the treatment course side effects and potential toxicities of radiation therapy in this situation with the patient. He appears to understand and wishes to proceed with planned course of treatment.  Plan:  Simulation and planning on February 2 with treatments to begin soon afterwards. I anticipate 10 treatments directed at the right pelvis and left hilar area with potential treatments directed at the right anterior lower rib cage area  if CT scanning shows a problem in this area.  ____________________________________ Blair Promise, MD

## 2014-09-21 NOTE — Progress Notes (Signed)
Please see the Nurse Progress Note in the MD Initial Consult Encounter for this patient. 

## 2014-09-21 NOTE — Addendum Note (Signed)
Encounter addended by: Jacqulyn Liner, RN on: 09/21/2014  4:17 PM<BR>     Documentation filed: Charges VN

## 2014-09-22 ENCOUNTER — Encounter: Payer: Self-pay | Admitting: Radiation Oncology

## 2014-09-22 NOTE — Progress Notes (Signed)
  Radiation Oncology         [phone] ________________________________  Name: Kerry Peck MRN: [id number]  Date: 09/22/2014  DOB: 1960/10/05  End of Treatment Note  Diagnosis:    Extensive stage small cell lung cancer, Stage IV (T2a, N1, M1b)    Indication for treatment:  Prophylactic treatments to the brain, treatments directed at large thoracic spine lesion and persistent disease in the left lung primary site     Radiation treatment dates:   December 22 through January 12  Site/dose:   Whole brain 25 gray in 10 fractions;                      left lung lesion in T6-T7 lytic lesion-35 gray in 14 fractions  Beams/energy:   Lateral fields for whole brain treatment                                3-D conformal for chest/spine treatment  Narrative: The patient tolerated radiation treatment relatively well.   He denied any significant nausea or headaches with his prophylactic cranial irradiation. He did experience some mild esophageal symptoms at the end of his radiation therapy.  Plan: The patient has completed radiation treatment. The patient will return to radiation oncology clinic for routine followup in one month. I advised them to call or return sooner if they have any questions or concerns related to their recovery or treatment.  -----------------------------------  Blair Promise, PhD, MD

## 2014-09-26 ENCOUNTER — Encounter: Payer: Self-pay | Admitting: Internal Medicine

## 2014-09-26 NOTE — Progress Notes (Signed)
Insurance is paying [phone]

## 2014-09-27 ENCOUNTER — Ambulatory Visit
Admission: RE | Admit: 2014-09-27 | Discharge: 2014-09-27 | Disposition: A | Discharge status: Home / Self Care | Payer: Federal, State, Local not specified - PPO | Source: Ambulatory Visit | Attending: Radiation Oncology | Admitting: Radiation Oncology

## 2014-09-27 DIAGNOSIS — C3492 Malignant neoplasm of unspecified part of left bronchus or lung: Secondary | ICD-10-CM | POA: Diagnosis not present

## 2014-09-27 MED ORDER — OXYCODONE-ACETAMINOPHEN 5-325 MG PO TABS: 1.0000 | ORAL_TABLET | ORAL | Status: DC | PRN

## 2014-09-28 DIAGNOSIS — C3492 Malignant neoplasm of unspecified part of left bronchus or lung: Secondary | ICD-10-CM | POA: Diagnosis not present

## 2014-09-29 ENCOUNTER — Encounter: Payer: Self-pay | Admitting: *Deleted

## 2014-09-29 ENCOUNTER — Ambulatory Visit
Admission: RE | Admit: 2014-09-29 | Discharge: 2014-09-29 | Disposition: A | Discharge status: Home / Self Care | Payer: Federal, State, Local not specified - PPO | Source: Ambulatory Visit | Attending: Radiation Oncology | Admitting: Radiation Oncology

## 2014-09-29 DIAGNOSIS — C3492 Malignant neoplasm of unspecified part of left bronchus or lung: Secondary | ICD-10-CM | POA: Diagnosis not present

## 2014-09-29 NOTE — Progress Notes (Signed)
[facility] Psychosocial Distress Screening Clinical Social Work  Clinical Social Work was referred by distress screening protocol.  The patient scored a 7 on the Psychosocial Distress Thermometer which indicates moderate distress. Clinical Social Worker attempted to contact patient to assess for distress and other psychosocial needs.   ONCBCN DISTRESS SCREENING 09/21/2014  Screening Type Change in Status  Distress experienced in past week (1-10) 7  Practical problem type Work/school  Emotional problem type Depression;Adjusting to illness;Isolation/feeling alone;Boredom;Adjusting to appearance changes  Spiritual/Religous concerns type Relating to God;Facing my mortality  Information Concerns Type Lack of info about diagnosis;Lack of info about complementary therapy choices  Physical Problem type Pain;Getting around;Bathing/dressing;Breathing;Mouth sores/swallowing;Skin dry/itchy  Physician notified of physical symptoms Yes  Other     Clinical Social Worker follow up needed: Yes.    If yes, follow up plan: CSW left voicemail for patient to return call as soon as possible.  [name], MSW, LCSW, OSW-C Clinical Social Worker [facility] [phone]

## 2014-09-29 NOTE — Progress Notes (Signed)
  Radiation Oncology         [phone] ________________________________  Name: Kerry Peck MRN: [id number]  Date: 09/29/2014  DOB: [birth year]  Simulation Verification Note    ICD-9-CM ICD-10-CM   1. Small cell lung cancer, left 162.9 C34.92     Status: outpatient  NARRATIVE: The patient was brought to the treatment unit and placed in the planned treatment position. The clinical setup was verified. Then port films were obtained and uploaded to the radiation oncology medical record software.  The treatment beams were carefully compared against the planned radiation fields. The position location and shape of the radiation fields was reviewed. They targeted volume of tissue appears to be appropriately covered by the radiation beams. Organs at risk appear to be excluded as planned.  Based on my personal review, I approved the simulation verification. The patient's treatment will proceed as planned. Treatment set up was checked for his left hilar treatment right anterior lower rib cage area and right proximal femur  -----------------------------------  Blair Promise, PhD, MD

## 2014-09-30 ENCOUNTER — Ambulatory Visit
Admission: RE | Admit: 2014-09-30 | Discharge: 2014-09-30 | Disposition: A | Discharge status: Home / Self Care | Payer: Federal, State, Local not specified - PPO | Source: Ambulatory Visit | Attending: Radiation Oncology | Admitting: Radiation Oncology

## 2014-09-30 DIAGNOSIS — C3492 Malignant neoplasm of unspecified part of left bronchus or lung: Secondary | ICD-10-CM | POA: Diagnosis not present

## 2014-10-03 ENCOUNTER — Ambulatory Visit
Admission: RE | Admit: 2014-10-03 | Discharge: 2014-10-03 | Disposition: A | Discharge status: Home / Self Care | Payer: Federal, State, Local not specified - PPO | Source: Ambulatory Visit | Attending: Radiation Oncology | Admitting: Radiation Oncology

## 2014-10-03 DIAGNOSIS — C3492 Malignant neoplasm of unspecified part of left bronchus or lung: Secondary | ICD-10-CM | POA: Diagnosis not present

## 2014-10-04 ENCOUNTER — Ambulatory Visit
Admission: RE | Admit: 2014-10-04 | Discharge: 2014-10-04 | Disposition: A | Discharge status: Home / Self Care | Payer: Federal, State, Local not specified - PPO | Source: Ambulatory Visit | Attending: Radiation Oncology | Admitting: Radiation Oncology

## 2014-10-04 ENCOUNTER — Encounter: Payer: Self-pay | Admitting: Radiation Oncology

## 2014-10-04 VITALS — BP 105/65 | HR 75 | Temp 98.2°F | Resp 20 | Ht 73.0 in | Wt 234.5 lb

## 2014-10-04 DIAGNOSIS — C3490 Malignant neoplasm of unspecified part of unspecified bronchus or lung: Secondary | ICD-10-CM

## 2014-10-04 DIAGNOSIS — C3492 Malignant neoplasm of unspecified part of left bronchus or lung: Secondary | ICD-10-CM | POA: Diagnosis not present

## 2014-10-04 MED ORDER — OXYCODONE-ACETAMINOPHEN 5-325 MG PO TABS: 1.0000 | ORAL_TABLET | ORAL | Status: DC | PRN

## 2014-10-04 MED ORDER — OXYCODONE HCL ER 20 MG PO T12A: 20.0000 mg | EXTENDED_RELEASE_TABLET | Freq: Two times a day (BID) | ORAL | Status: DC

## 2014-10-04 NOTE — Progress Notes (Signed)
  Radiation Oncology         [phone]) [phone] ________________________________  Name: Kerry Peck MRN: [id number]  Date: 10/04/2014  DOB: Jun 29, 1961  Weekly Radiation Therapy Management  Diagnosis: Extensive stage small cell lung cancer  Current Dose: 12 Gy     Planned Dose:  30 Gy directed to the left hilar area right lower rib cage area and right pelvis  Narrative . . . . . . . . The patient presents for routine under treatment assessment.                                   The patient has noticed improvement in his pain along the right lower chest however has noticed pain along his left lower rib cage area. I refilled the patient's oxycodone. I'll also given him a prescription for OxyContin as his pain medication is not lasting long enough.                                 Set-up films were reviewed.                                 The chart was checked. Physical Findings. . .  height is 6\' 1"  (1.854 m) and weight is 234 lb 8 oz (106.369 kg). His oral temperature is 98.2 F (36.8 C). His blood pressure is 105/65 and his pulse is 75. His respiration is 20 and oxygen saturation is 95%. . The lungs are clear. The heart has a regular rhythm and rate. Palpation along the left lower anterior rib cage area Reveals no obvious point tenderness Impression . . . . . . . The patient is tolerating radiation. Plan . . . . . . . . . . . . Continue treatment as planned.  ________________________________   Blair Promise, PhD, MD

## 2014-10-04 NOTE — Progress Notes (Signed)
Kerry Peck has completed 4 fractions to his left hilar, right rib and right pelvis.  Kerry Peck reports the pain in his right hip and right side is better.  Kerry Peck is rating the pain in his right side at a 4/10.  Kerry Peck is also having pain in his left side, especially when Kerry Peck takes a deep breath, that started Friday night.  Kerry Peck is rating it at a 7/10.  Kerry Peck is taking percocet 2 tablets q 4 hours and is requested a refill.  Kerry Peck reports an occasional cough.  Kerry Peck denies hemoptysis.  Reviewed patient education including fatigue, skin changes and throat changes.    BP 105/65 mmHg  Pulse 75  Temp(Src) 98.2 F (36.8 C) (Oral)  Resp 20  Ht 6\' 1"  (1.854 m)  Wt 234 lb 8 oz (106.369 kg)  BMI 30.95 kg/m2  SpO2 95%

## 2014-10-05 ENCOUNTER — Telehealth: Payer: Self-pay | Admitting: Oncology

## 2014-10-05 ENCOUNTER — Ambulatory Visit
Admission: RE | Admit: 2014-10-05 | Discharge: 2014-10-05 | Disposition: A | Discharge status: Home / Self Care | Payer: Federal, State, Local not specified - PPO | Source: Ambulatory Visit | Attending: Radiation Oncology | Admitting: Radiation Oncology

## 2014-10-05 DIAGNOSIS — C3492 Malignant neoplasm of unspecified part of left bronchus or lung: Secondary | ICD-10-CM | POA: Diagnosis not present

## 2014-10-05 NOTE — Telephone Encounter (Signed)
Called [facility] for prior authorization for OxyContin 20 mg.  They will fax an approval form in the next 48 hours to [phone].

## 2014-10-06 ENCOUNTER — Ambulatory Visit
Admission: RE | Admit: 2014-10-06 | Discharge: 2014-10-06 | Disposition: A | Discharge status: Home / Self Care | Payer: Federal, State, Local not specified - PPO | Source: Ambulatory Visit | Attending: Radiation Oncology | Admitting: Radiation Oncology

## 2014-10-06 DIAGNOSIS — C3492 Malignant neoplasm of unspecified part of left bronchus or lung: Secondary | ICD-10-CM | POA: Diagnosis not present

## 2014-10-07 ENCOUNTER — Telehealth: Payer: Self-pay | Admitting: Oncology

## 2014-10-07 ENCOUNTER — Ambulatory Visit
Admission: RE | Admit: 2014-10-07 | Discharge: 2014-10-07 | Disposition: A | Discharge status: Home / Self Care | Payer: Federal, State, Local not specified - PPO | Source: Ambulatory Visit | Attending: Radiation Oncology | Admitting: Radiation Oncology

## 2014-10-07 DIAGNOSIS — C3492 Malignant neoplasm of unspecified part of left bronchus or lung: Secondary | ICD-10-CM | POA: Diagnosis not present

## 2014-10-07 NOTE — Telephone Encounter (Signed)
Called and left a message notifying Dawson that the prior authorization has gone through per [facility] for his OxyContin.

## 2014-10-10 ENCOUNTER — Ambulatory Visit: Payer: Federal, State, Local not specified - PPO | Admitting: Radiation Oncology

## 2014-10-10 ENCOUNTER — Ambulatory Visit
Admission: RE | Admit: 2014-10-10 | Discharge: 2014-10-10 | Disposition: A | Discharge status: Home / Self Care | Payer: Federal, State, Local not specified - PPO | Source: Ambulatory Visit | Attending: Radiation Oncology | Admitting: Radiation Oncology

## 2014-10-10 DIAGNOSIS — C3492 Malignant neoplasm of unspecified part of left bronchus or lung: Secondary | ICD-10-CM | POA: Diagnosis not present

## 2014-10-11 ENCOUNTER — Ambulatory Visit
Admission: RE | Admit: 2014-10-11 | Discharge: 2014-10-11 | Disposition: A | Discharge status: Home / Self Care | Payer: Federal, State, Local not specified - PPO | Source: Ambulatory Visit | Attending: Radiation Oncology | Admitting: Radiation Oncology

## 2014-10-11 ENCOUNTER — Encounter: Payer: Self-pay | Admitting: Radiation Oncology

## 2014-10-11 ENCOUNTER — Telehealth: Payer: Self-pay | Admitting: Oncology

## 2014-10-11 VITALS — BP 156/72 | HR 73 | Temp 98.9°F | Resp 20 | Wt 234.4 lb

## 2014-10-11 DIAGNOSIS — C3490 Malignant neoplasm of unspecified part of unspecified bronchus or lung: Secondary | ICD-10-CM

## 2014-10-11 DIAGNOSIS — C3492 Malignant neoplasm of unspecified part of left bronchus or lung: Secondary | ICD-10-CM | POA: Diagnosis not present

## 2014-10-11 MED ORDER — OXYCODONE-ACETAMINOPHEN 5-325 MG PO TABS: 1.0000 | ORAL_TABLET | ORAL | Status: DC | PRN

## 2014-10-11 NOTE — Progress Notes (Signed)
Weekly rd txs Left hilar,right rib,right pelvism,on mid back erythema,dry skin, uses biafine cream bid, slight pink on front of chest, right rib area not as sore stated, back feels like muscle pain stated, "Making me stiff, tightened"appetite good, energy level okay, regular bowel movements, takes Oxycotin q 12h, and Oxycodone/apap breakthrough  For pain pain level is 5/10 scale at present. Not as sharp as last time he was here stated 2:24 PM

## 2014-10-11 NOTE — Progress Notes (Signed)
  Radiation Oncology         [phone] ________________________________  Name: Dmoni Fortson MRN: [id number]  Date: 10/11/2014  DOB: [birth year]  Weekly Radiation Therapy Management  Diagnosis: Extensive stage small cell lung cancer  Current Dose: 27 Gy     Planned Dose:  30 Gy directed to the left hilar area,  right lower rib cage area and right pelvis  Narrative . . . . . . . . The patient presents for routine under treatment assessment.                                   The patient is without complaint. His pain in the right pelvis and right lower rib cage area has improved with this treatment. He denies any significant cough or breathing problems or swallowing difficulties.                                 Set-up films were reviewed.                                 The chart was checked. Physical Findings. . .  weight is 234 lb 6.4 oz (106.323 kg). His oral temperature is 98.9 F (37.2 C). His blood pressure is 156/72 and his pulse is 73. His respiration is 20. . The lungs are clear. The heart has a regular rhythm and rate. The abdomen is soft and nontender with normal bowel sounds. Impression . . . . . . . The patient is tolerating radiation. Plan . . . . . . . . . . . . Continue treatment as planned. Patient attempted to work but this was too difficult for him with back pain and fatigue; he is seeking permanent disability.  ________________________________   Blair Promise, PhD, MD

## 2014-10-11 NOTE — Telephone Encounter (Signed)
Called and left a message regarding oxycodone 20 mg.  Advised him that [facility] is showing that a coupon had been applied from his insurance.  [facility] said the price is still $130.00 and is thinking he may have a high deductible/co pay.  Requested that Treydon call back this afternoon.

## 2014-10-12 ENCOUNTER — Ambulatory Visit
Admission: RE | Admit: 2014-10-12 | Discharge: 2014-10-12 | Disposition: A | Discharge status: Home / Self Care | Payer: Federal, State, Local not specified - PPO | Source: Ambulatory Visit | Attending: Radiation Oncology | Admitting: Radiation Oncology

## 2014-10-12 DIAGNOSIS — C3492 Malignant neoplasm of unspecified part of left bronchus or lung: Secondary | ICD-10-CM | POA: Diagnosis not present

## 2014-10-21 ENCOUNTER — Telehealth: Payer: Self-pay | Admitting: Oncology

## 2014-10-21 NOTE — Telephone Encounter (Signed)
Kerry Peck called requesting a refill on oxycodone/acetaminophen 5/325 mg tablets that was last filled on 10/11/14.  He said he is taking the long acting OxyContin 20 mg BID and 2 tablets of the Percocet every 4 hours in between.  He has enough to last through the weekend.  Advised him that Dr. Sondra Come will be notified on Monday.  [name] verbalized agreement.

## 2014-10-22 ENCOUNTER — Other Ambulatory Visit: Payer: Self-pay | Admitting: Radiation Oncology

## 2014-10-22 MED ORDER — OXYCODONE-ACETAMINOPHEN 5-325 MG PO TABS: 1.0000 | ORAL_TABLET | ORAL | Status: DC | PRN

## 2014-10-24 ENCOUNTER — Telehealth: Payer: Self-pay | Admitting: Oncology

## 2014-10-24 NOTE — Telephone Encounter (Signed)
Left a message for Kerry Peck advising him that the percocet refill is ready for pick up in the Radiation nursing area.

## 2014-10-25 ENCOUNTER — Telehealth: Payer: Self-pay | Admitting: Oncology

## 2014-10-25 NOTE — Telephone Encounter (Signed)
Left a message for Kerry Peck advising him that his Physical Restriction paperwork is ready for him to pick up in the Radiation waiting room desk.

## 2014-10-29 ENCOUNTER — Encounter: Payer: Self-pay | Admitting: Radiation Oncology

## 2014-10-29 NOTE — Progress Notes (Signed)
  Radiation Oncology         [phone] ________________________________  Name: Kerry Peck MRN: [id number]  Date: 09/27/14  DOB: 1961/01/11  SIMULATION AND TREATMENT PLANNING NOTE   DIAGNOSIS:  Metastatic small cell lung cancer  NARRATIVE:  The patient was brought to the [facility] suite.  Identity was confirmed.  All relevant records and images related to the planned course of therapy were reviewed.  The patient freely provided informed written consent to proceed with treatment after reviewing the details related to the planned course of therapy. The consent form was witnessed and verified by the simulation staff.  Then, the patient was set-up in a stable reproducible  supine position for radiation therapy.  CT images were obtained.  Surface markings were placed.  The CT images were loaded into the planning software.  Then the target and avoidance structures were contoured.  Treatment planning then occurred.  The radiation prescription was entered and confirmed.  Then, I designed and supervised the construction of a total of 8 medically necessary complex treatment devices.  I have requested : 3D Simulation  I have requested a DVH of the following structures: lungs, heart, spinal cord, esophagus.  I have ordered:dose calc.  PLAN:  The patient will receive 30 Gy in 10 fractions directed at the left hilar region, right lower rib cage area and right pelvis. The patient will be treated with 3 separate isocenters     Special treatment procedure note  The patient has received prior radiation therapy directed at the chest area. Additional time was taken in reviewing the patient's previous treatment as it relates to the current set up. Increased potential for toxicities related to overlapping fields and additional time warrants a special treatment procedure.  ________________________________  Gery Pray, MD

## 2014-10-29 NOTE — Progress Notes (Signed)
  Radiation Oncology         [phone]) [phone] ________________________________  Name: Kerry Peck MRN: [id number]  Date: 10/29/2014  DOB: 12-Oct-1960  End of Treatment Note  Diagnosis:   Metastatic small cell lung cancer     Indication for treatment:  Painful osseous metastasis and progression in the left hilar region       Radiation treatment dates:   February 4 through February 17  Site/dose:   #1 left hilar area 30 gray in 10 fractions                        #2 right pelvis region 30 gray in 10 fractions                         #3 right lower rib cage area 30 gray in 10 fractions  Beams/energy:   3-D conformal multiple beams at all 3 locations  Narrative: The patient tolerated radiation treatment relatively well.   He had improvement in his pain along the right pelvis and right lower rib cage area. No significant swallowing problems at the completion of treatment  Plan: The patient has completed radiation treatment. The patient will return to radiation oncology clinic for routine followup in one month. I advised them to call or return sooner if they have any questions or concerns related to their recovery or treatment.  -----------------------------------  Blair Promise, PhD, MD

## 2014-10-31 ENCOUNTER — Telehealth: Payer: Self-pay | Admitting: Oncology

## 2014-10-31 ENCOUNTER — Encounter: Payer: Self-pay | Admitting: Oncology

## 2014-10-31 NOTE — Progress Notes (Signed)
Kerry Peck came to the nursing clinic for help filling out his retirement paperwork.  While he was here, he requested a refill on oxycodone 20 mg.  He said his pain is worse and is "all over."  He is wondering if he can get something stronger to help with the pain.  He refused to stay and see another physician because he stated that "Dr. Sondra Come is familiar with his case."  He is now using a cane to help walk.  He also reports a dry cough but denies hemoptysis and fevers.  He reports trouble keeping his food down.  Vitals taken: temp 98.5 degrees, BP 122/89, hr 99, Oxygen Saturation 96% on room air and a weight of 214.6 lb.  Advised patient to go to the ER if he starts to run a fever. Kerry Peck verbalized agreement.

## 2014-10-31 NOTE — Telephone Encounter (Signed)
Called in refill per Dr. Sondra Come to [facility] for sucralfate (CARAFATE) 1 GM/10ML suspension.  Take 10 mLs (1 g total) by mouth 4 (four) times daily - with meals and at bedtime.  Disp. 420 mL. 1 refill.

## 2014-10-31 NOTE — Telephone Encounter (Signed)
Khalin called back and said he has been eating 1 [employer] microwave dinner per day and is eating a snack like crackers or a fruit bowl before taking his meds.  He is drinking 1 bottle of flavored water per day.  He reports that he has to eat slowly with small amounts or he throws it back up.  He reports having a dry mouth and only sleeping 45 minutes at a time during the night.

## 2014-11-01 ENCOUNTER — Telehealth: Payer: Self-pay | Admitting: Medical Oncology

## 2014-11-01 ENCOUNTER — Encounter: Payer: Self-pay | Admitting: Medical Oncology

## 2014-11-01 ENCOUNTER — Encounter: Payer: Self-pay | Admitting: Oncology

## 2014-11-01 NOTE — Telephone Encounter (Signed)
erroneous

## 2014-11-01 NOTE — Telephone Encounter (Addendum)
asking if Paperwork done for "retirement" . Call transferred to [facility]. I do not have any paperwork for pt and told him that via voice message. I also told him I sent Ebony his message.

## 2014-11-01 NOTE — Telephone Encounter (Addendum)
Pt presented with signed release form for [place] [employer] for [place] to send records,  however there was no information where to send records. I gave pt a letter to take to his supervisor. His supervisor Vicente Males not available so I left message for Manual Meier [phone].

## 2014-11-02 ENCOUNTER — Encounter: Payer: Self-pay | Admitting: Radiation Oncology

## 2014-11-02 ENCOUNTER — Ambulatory Visit
Admission: RE | Admit: 2014-11-02 | Discharge: 2014-11-02 | Disposition: A | Discharge status: Home / Self Care | Payer: Federal, State, Local not specified - PPO | Source: Ambulatory Visit | Attending: Radiation Oncology | Admitting: Radiation Oncology

## 2014-11-02 ENCOUNTER — Other Ambulatory Visit: Payer: Self-pay | Admitting: Physician Assistant

## 2014-11-02 ENCOUNTER — Telehealth: Payer: Self-pay | Admitting: Oncology

## 2014-11-02 ENCOUNTER — Ambulatory Visit (HOSPITAL_BASED_OUTPATIENT_CLINIC_OR_DEPARTMENT_OTHER): Payer: Federal, State, Local not specified - PPO

## 2014-11-02 ENCOUNTER — Encounter: Payer: Self-pay | Admitting: Oncology

## 2014-11-02 ENCOUNTER — Encounter: Payer: Self-pay | Admitting: *Deleted

## 2014-11-02 DIAGNOSIS — C3490 Malignant neoplasm of unspecified part of unspecified bronchus or lung: Secondary | ICD-10-CM | POA: Insufficient documentation

## 2014-11-02 DIAGNOSIS — Z9221 Personal history of antineoplastic chemotherapy: Secondary | ICD-10-CM | POA: Insufficient documentation

## 2014-11-02 DIAGNOSIS — Z79891 Long term (current) use of opiate analgesic: Secondary | ICD-10-CM | POA: Diagnosis not present

## 2014-11-02 DIAGNOSIS — R102 Pelvic and perineal pain: Secondary | ICD-10-CM | POA: Diagnosis not present

## 2014-11-02 DIAGNOSIS — Z79899 Other long term (current) drug therapy: Secondary | ICD-10-CM | POA: Insufficient documentation

## 2014-11-02 DIAGNOSIS — Z7982 Long term (current) use of aspirin: Secondary | ICD-10-CM | POA: Insufficient documentation

## 2014-11-02 DIAGNOSIS — Z7951 Long term (current) use of inhaled steroids: Secondary | ICD-10-CM | POA: Diagnosis not present

## 2014-11-02 DIAGNOSIS — R5383 Other fatigue: Secondary | ICD-10-CM | POA: Insufficient documentation

## 2014-11-02 LAB — COMPREHENSIVE METABOLIC PANEL (CC13)
ALBUMIN: 3.6 g/dL (ref 3.5–5.0)
ALT: 25 U/L (ref 0–55)
AST: 76 U/L — ABNORMAL HIGH (ref 5–34)
Alkaline Phosphatase: 99 U/L (ref 40–150)
Anion Gap: 12 mEq/L — ABNORMAL HIGH (ref 3–11)
BUN: 18 mg/dL (ref 7.0–26.0)
CO2: 27 meq/L (ref 22–29)
CREATININE: 0.9 mg/dL (ref 0.7–1.3)
Calcium: 14.4 mg/dL (ref 8.4–10.4)
Chloride: 100 mEq/L (ref 98–109)
GLUCOSE: 97 mg/dL (ref 70–140)
POTASSIUM: 3.3 meq/L — AB (ref 3.5–5.1)
Sodium: 139 mEq/L (ref 136–145)
Total Bilirubin: 0.87 mg/dL (ref 0.20–1.20)
Total Protein: 7.7 g/dL (ref 6.4–8.3)

## 2014-11-02 LAB — CBC WITH DIFFERENTIAL/PLATELET
BASO%: 0.5 % (ref 0.0–2.0)
Basophils Absolute: 0 10*3/uL (ref 0.0–0.1)
EOS%: 6.2 % (ref 0.0–7.0)
Eosinophils Absolute: 0.5 10*3/uL (ref 0.0–0.5)
HEMATOCRIT: 33.3 % — AB (ref 38.4–49.9)
HGB: 11.1 g/dL — ABNORMAL LOW (ref 13.0–17.1)
LYMPH%: 11.8 % — ABNORMAL LOW (ref 14.0–49.0)
MCH: 31 pg (ref 27.2–33.4)
MCHC: 33.4 g/dL (ref 32.0–36.0)
MCV: 92.8 fL (ref 79.3–98.0)
MONO#: 0.8 10*3/uL (ref 0.1–0.9)
MONO%: 10.2 % (ref 0.0–14.0)
NEUT%: 71.3 % (ref 39.0–75.0)
NEUTROS ABS: 5.3 10*3/uL (ref 1.5–6.5)
PLATELETS: 224 10*3/uL (ref 140–400)
RBC: 3.59 10*6/uL — ABNORMAL LOW (ref 4.20–5.82)
RDW: 13.9 % (ref 11.0–14.6)
WBC: 7.5 10*3/uL (ref 4.0–10.3)
lymph#: 0.9 10*3/uL (ref 0.9–3.3)

## 2014-11-02 MED ORDER — SODIUM CHLORIDE 0.9 % IV SOLN
INTRAVENOUS | Status: DC
  Administered 2014-11-02: 16:00:00 via INTRAVENOUS

## 2014-11-02 MED ORDER — OXYCODONE HCL ER 40 MG PO T12A: 40.0000 mg | EXTENDED_RELEASE_TABLET | Freq: Two times a day (BID) | ORAL | Status: DC

## 2014-11-02 MED ORDER — OXYCODONE-ACETAMINOPHEN 10-325 MG PO TABS: 1.0000 | ORAL_TABLET | ORAL | Status: DC | PRN

## 2014-11-02 MED ORDER — ZOLEDRONIC ACID 4 MG/5ML IV CONC
4.0000 mg | Freq: Once | INTRAVENOUS | Status: DC
  Administered 2014-11-02: 4 mg via INTRAVENOUS
  Filled 2014-11-02: qty 5

## 2014-11-02 NOTE — Patient Instructions (Signed)
Zoledronic Acid injection (Hypercalcemia, Oncology) What is this medicine? ZOLEDRONIC ACID (ZOE le dron ik AS id) lowers the amount of calcium loss from bone. It is used to treat too much calcium in your blood from cancer. It is also used to prevent complications of cancer that has spread to the bone. This medicine may be used for other purposes; ask your health care provider or pharmacist if you have questions. COMMON BRAND NAME(S): Zometa What should I tell my health care provider before I take this medicine? They need to know if you have any of these conditions: -aspirin-sensitive asthma -cancer, especially if you are receiving medicines used to treat cancer -dental disease or wear dentures -infection -kidney disease -receiving corticosteroids like dexamethasone or prednisone -an unusual or allergic reaction to zoledronic acid, other medicines, foods, dyes, or preservatives -pregnant or trying to get pregnant -breast-feeding How should I use this medicine? This medicine is for infusion into a vein. It is given by a health care professional in a hospital or clinic setting. Talk to your pediatrician regarding the use of this medicine in children. Special care may be needed. Overdosage: If you think you have taken too much of this medicine contact a poison control center or emergency room at once. NOTE: This medicine is only for you. Do not share this medicine with others. What if I miss a dose? It is important not to miss your dose. Call your doctor or health care professional if you are unable to keep an appointment. What may interact with this medicine? -certain antibiotics given by injection -NSAIDs, medicines for pain and inflammation, like ibuprofen or naproxen -some diuretics like bumetanide, furosemide -teriparatide -thalidomide This list may not describe all possible interactions. Give your health care provider a list of all the medicines, herbs, non-prescription drugs, or  dietary supplements you use. Also tell them if you smoke, drink alcohol, or use illegal drugs. Some items may interact with your medicine. What should I watch for while using this medicine? Visit your doctor or health care professional for regular checkups. It may be some time before you see the benefit from this medicine. Do not stop taking your medicine unless your doctor tells you to. Your doctor may order blood tests or other tests to see how you are doing. Women should inform their doctor if they wish to become pregnant or think they might be pregnant. There is a potential for serious side effects to an unborn child. Talk to your health care professional or pharmacist for more information. You should make sure that you get enough calcium and vitamin D while you are taking this medicine. Discuss the foods you eat and the vitamins you take with your health care professional. Some people who take this medicine have severe bone, joint, and/or muscle pain. This medicine may also increase your risk for jaw problems or a broken thigh bone. Tell your doctor right away if you have severe pain in your jaw, bones, joints, or muscles. Tell your doctor if you have any pain that does not go away or that gets worse. Tell your dentist and dental surgeon that you are taking this medicine. You should not have major dental surgery while on this medicine. See your dentist to have a dental exam and fix any dental problems before starting this medicine. Take good care of your teeth while on this medicine. Make sure you see your dentist for regular follow-up appointments. What side effects may I notice from receiving this medicine? Side effects that   you should report to your doctor or health care professional as soon as possible: -allergic reactions like skin rash, itching or hives, swelling of the face, lips, or tongue -anxiety, confusion, or depression -breathing problems -changes in vision -eye pain -feeling faint or  lightheaded, falls -jaw pain, especially after dental work -mouth sores -muscle cramps, stiffness, or weakness -trouble passing urine or change in the amount of urine Side effects that usually do not require medical attention (report to your doctor or health care professional if they continue or are bothersome): -bone, joint, or muscle pain -constipation -diarrhea -fever -hair loss -irritation at site where injected -loss of appetite -nausea, vomiting -stomach upset -trouble sleeping -trouble swallowing -weak or tired This list may not describe all possible side effects. Call your doctor for medical advice about side effects. You may report side effects to FDA at 1-800-FDA-1088. Where should I keep my medicine? This drug is given in a hospital or clinic and will not be stored at home. NOTE: This sheet is a summary. It may not cover all possible information. If you have questions about this medicine, talk to your doctor, pharmacist, or health care provider.  2015, Elsevier/Gold Standard. (2013-01-21 13:03:13) Dehydration, Adult Dehydration is when you lose more fluids from the body than you take in. Vital organs like the kidneys, brain, and heart cannot function without a proper amount of fluids and salt. Any loss of fluids from the body can cause dehydration.  CAUSES   Vomiting.  Diarrhea.  Excessive sweating.  Excessive urine output.  Fever. SYMPTOMS  Mild dehydration  Thirst.  Dry lips.  Slightly dry mouth. Moderate dehydration  Very dry mouth.  Sunken eyes.  Skin does not bounce back quickly when lightly pinched and released.  Dark urine and decreased urine production.  Decreased tear production.  Headache. Severe dehydration  Very dry mouth.  Extreme thirst.  Rapid, weak pulse (more than 100 beats per minute at rest).  Cold hands and feet.  Not able to sweat in spite of heat and temperature.  Rapid breathing.  Blue lips.  Confusion and  lethargy.  Difficulty being awakened.  Minimal urine production.  No tears. DIAGNOSIS  Your caregiver will diagnose dehydration based on your symptoms and your exam. Blood and urine tests will help confirm the diagnosis. The diagnostic evaluation should also identify the cause of dehydration. TREATMENT  Treatment of mild or moderate dehydration can often be done at home by increasing the amount of fluids that you drink. It is best to drink small amounts of fluid more often. Drinking too much at one time can make vomiting worse. Refer to the home care instructions below. Severe dehydration needs to be treated at the hospital where you will probably be given intravenous (IV) fluids that contain water and electrolytes. HOME CARE INSTRUCTIONS   Ask your caregiver about specific rehydration instructions.  Drink enough fluids to keep your urine clear or pale yellow.  Drink small amounts frequently if you have nausea and vomiting.  Eat as you normally do.  Avoid:  Foods or drinks high in sugar.  Carbonated drinks.  Juice.  Extremely hot or cold fluids.  Drinks with caffeine.  Fatty, greasy foods.  Alcohol.  Tobacco.  Overeating.  Gelatin desserts.  Wash your hands well to avoid spreading bacteria and viruses.  Only take over-the-counter or prescription medicines for pain, discomfort, or fever as directed by your caregiver.  Ask your caregiver if you should continue all prescribed and over-the-counter medicines.  Keep all  follow-up appointments with your caregiver. SEEK MEDICAL CARE IF:  You have abdominal pain and it increases or stays in one area (localizes).  You have a rash, stiff neck, or severe headache.  You are irritable, sleepy, or difficult to awaken.  You are weak, dizzy, or extremely thirsty. SEEK IMMEDIATE MEDICAL CARE IF:   You are unable to keep fluids down or you get worse despite treatment.  You have frequent episodes of vomiting or  diarrhea.  You have blood or green matter (bile) in your vomit.  You have blood in your stool or your stool looks black and tarry.  You have not urinated in 6 to 8 hours, or you have only urinated a small amount of very dark urine.  You have a fever.  You faint. MAKE SURE YOU:   Understand these instructions.  Will watch your condition.  Will get help right away if you are not doing well or get worse. Document Released: 08/12/2005 Document Revised: 11/04/2011 Document Reviewed: 04/01/2011 [facility] Patient Information 2015 [place], [place]. This information is not intended to replace advice given to you by your health care provider. Make sure you discuss any questions you have with your health care provider.

## 2014-11-02 NOTE — Telephone Encounter (Signed)
Left a message for Kerry Peck regarding his lab work from this morning.  Requested a return call.

## 2014-11-02 NOTE — Progress Notes (Signed)
   Department of Radiation Oncology  Phone:  [phone] Fax:        [phone]  To Whom It May Concern:  Mr. Kerry Peck is a 54 year old gentleman diagnosed with extensive stage small cell lung cancer Stage IV (T2a, N1, M1b) diagnosed in July of 2015. He initially was treated with systemic chemotherapy. He then received radiation treatments directed at the brain and chest region as well as the pelvis region for metastatic spread. The patient has recently recurred and his performance status has deteriorated. He is unable to walk due to an unsteady feeling. 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 It is unsafe for him to continue working.  he may harm himself as well as others. I would recommend that he be considered for permanent complete disability/retirement from [employer] service.  anticipated life expectancy is less than 6 months.  Sincerely,     Rulon Sera.D.

## 2014-11-02 NOTE — Progress Notes (Addendum)
Kerry Peck here for follow up with a family friend.  He reports pain in his right underarm area and right shoulder blade, left side of his tail bone and when he coughs he feels like his lungs are squeezing.  He is using aleve once to twice a day, oxycontin 20 mg twice a day and 2 tablets of percocet q 4 hours.  He reports it is not helping his pain.  He reports having to use a cane for the last 3 days to walk.  He feels off balance when he walks.  He reports he is coughing more with grey sputum.  He reports shortness of breath with any activity.  His oxygen saturation on room air today was 92%.  He reports nausea and "gagging."  He is taking compazine and is wanting a refill or is wondering if zofran would be better.  He has lost 20 lb 10/11/12.  Orthostatic vitals taken: bp sitting 102/65, hr 94, bp standing 95/78, hr 192 on the monitor, 120 manual.    BP 102/65 mmHg  Pulse 94  Temp(Src) 99.3 F (37.4 C) (Oral)  Ht 6\' 1"  (1.854 m)  Wt 214 lb 3.2 oz (97.16 kg)  BMI 28.27 kg/m2  SpO2 92%

## 2014-11-02 NOTE — Progress Notes (Signed)
Radiation Oncology         (336) [phone] ________________________________  Name: Kerry Peck MRN: [id number]  Date: 11/02/2014  DOB: Mar 05, 1961  Follow-Up Visit Note  CC: Kerry Amel, MD  Kerry Bears, MD    ICD-9-CM ICD-10-CM   1. Small cell lung cancer, unspecified laterality 162.9 C34.90 CBC with Differential     Comprehensive metabolic panel    Diagnosis:   Extensive stage small cell lung cancer  Interval Since Last Radiation:  3  months, he recently completed treatments to the left hilar area, right pelvis, right lower rib cage area  Narrative:  The patient returns today for follow-up earlier than originally scheduled. The patient is having more problems with pain. He is complaining of pain in the left pelvis at this time right upper chest wall axillary region. He also complains of unsteadiness with walking. He denies any headaches or visual problems. Patient was brought in by wheelchair from the parking lot today which is unusual for Kerry Peck. He denies any bowel or bladder incontinence. He has difficulty sleeping and night in light of his pain.  Patient has stopped working in light of his fatigue and overall performance status.                              ALLERGIES:  has No Known Allergies.  Meds: Current Outpatient Prescriptions  Medication Sig Dispense Refill  . albuterol (PROVENTIL HFA;VENTOLIN HFA) 108 (90 BASE) MCG/ACT inhaler Inhale 2 puffs into the lungs every 6 (six) hours as needed for wheezing. 1 Inhaler 0  . amLODipine (NORVASC) 10 MG tablet Take 10 mg by mouth daily.    Marland Kitchen aspirin EC 81 MG tablet Take 81 mg by mouth daily.    Marland Kitchen atorvastatin (LIPITOR) 40 MG tablet Take 40 mg by mouth daily.    . Blood Pressure Monitoring (5 SERIES BP MONITOR) DEVI     . calcium-vitamin D (OSCAL WITH D) 500-200 MG-UNIT per tablet Take 1 tablet by mouth.    . emollient (BIAFINE) cream Apply topically as needed. 454 g 0  . esomeprazole (NEXIUM) 20 MG capsule Take 20 mg  by mouth daily as needed.    . fenofibrate 160 MG tablet Take 160 mg by mouth daily.    . Fluticasone-Salmeterol (ADVAIR) 250-50 MCG/DOSE AEPB Inhale 1 puff into the lungs every 12 (twelve) hours.    Marland Kitchen glipiZIDE (GLUCOTROL) 5 MG tablet Take 5 mg by mouth daily.    . metFORMIN (GLUCOPHAGE) 500 MG tablet Take 1,000 mg by mouth 2 (two) times daily with a meal.     . metoprolol tartrate (LOPRESSOR) 25 MG tablet Take 25 mg by mouth daily.     . Multiple Vitamins-Minerals (CENTRUM SILVER ADULT 50+ PO) Take by mouth.    . naproxen sodium (ANAPROX) 220 MG tablet Take 220 mg by mouth 2 (two) times daily with a meal. Takes 2 tablets bid    . ONE TOUCH ULTRA TEST test strip     . pantoprazole (PROTONIX) 40 MG tablet Take 40 mg by mouth as needed.     . prochlorperazine (COMPAZINE) 10 MG tablet Take 1 tablet (10 mg total) by mouth every 6 (six) hours as needed for nausea or vomiting. 30 tablet 0  . pyridOXINE (VITAMIN B-6) 100 MG tablet Take 100 mg by mouth daily.    . sucralfate (CARAFATE) 1 GM/10ML suspension Take 10 mLs (1 g total) by mouth 4 (four) times daily -  with meals and at bedtime. (Patient taking differently: Take 1 g by mouth 4 (four) times daily -  with meals and at bedtime. Called in refill to [facility] per Dr. Sondra Come on 10/31/14.) 420 mL 1  . vitamin C (ASCORBIC ACID) 500 MG tablet Take 500 mg by mouth daily.    . OxyCODONE (OXYCONTIN) 40 mg T12A 12 hr tablet Take 1 tablet (40 mg total) by mouth every 12 (twelve) hours. 60 tablet 0  . oxyCODONE-acetaminophen (PERCOCET) 10-325 MG per tablet Take 1 tablet by mouth every 4 (four) hours as needed for pain. 30 tablet 0   No current facility-administered medications for this encounter.    Physical Findings: The patient is in no acute distress. Patient is alert and oriented. Accompanied by sister-in-law on evaluation today  height is 6\' 1"  (1.854 m) and weight is 214 lb 3.2 oz (97.16 kg). His oral temperature is 99.3 F (37.4 C). His blood  pressure is 102/65 and his pulse is 94. His oxygen saturation is 92%. .  Patient is occasionally slow to respond to questions. Examination of oral cavity and posterior pharynx reveals the tongue to be midline. No secondary infection. Pupils are equal round and reactive to light. The extraocular eye movements are intact. Examination of the lungs reveals them to be clear. The heart has a regular rhythm and rate. On neurological examination motor strength is 5 out of 5 in the proximal and distal muscle groups in the upper lower extremities. Palpation along the chest wall area and left pelvis reveals no obvious point tenderness.  Lab Findings: Lab Results  Component Value Date   WBC 7.5 11/02/2014   HGB 11.1* 11/02/2014   HCT 33.3* 11/02/2014   MCV 92.8 11/02/2014   PLT 224 11/02/2014    Radiographic Findings: No results found.  Impression: Extensive stage small cell lung cancer. I'm concerned that the patient is having progressive disease and hence more pain from this situation. Patient will have a doubling of his OxyContin  and OxyCodone  prescriptions. These were written and given to the patient today.  Plan:  Patient will undergo blood work today in particular to rule out hypercalcemia way he is reacting today. Patient may also require a repeat brain MRI. Patient is scheduled for a full body scans on March 23rd then follow-up on March 28.  ____________________________________ Blair Promise, MD

## 2014-11-02 NOTE — Telephone Encounter (Signed)
Called [facility] per Dr. Sondra Come for Jakari's critical calcium level of 14.4.  Per Burnetta Sabin, she will talk to Dr. Julien Nordmann and will take care of orders for fluids.

## 2014-11-02 NOTE — Progress Notes (Signed)
3.8.16:  Patient dropped off disability paperwork for physician.  Documentation needed is letter from physician detailing diagnosis and treatment plan, etc.  Forwarded copies to Dr. Sondra Come.

## 2014-11-02 NOTE — Progress Notes (Signed)
Kerry Peck stopped by the clinic.  Advised him of his lab results from this morning.  He verbalized understanding and said he was scheduled for IV fluids today.  Encouraged him to call if he needs anything.

## 2014-11-03 ENCOUNTER — Encounter: Payer: Self-pay | Admitting: Oncology

## 2014-11-03 ENCOUNTER — Telehealth: Payer: Self-pay | Admitting: Oncology

## 2014-11-03 ENCOUNTER — Encounter: Payer: Self-pay | Admitting: Radiation Oncology

## 2014-11-03 NOTE — Progress Notes (Signed)
I called patient to please come and sign a release so that we may send his radiation therapy records to his [place]. He was agreeable to doing this.

## 2014-11-03 NOTE — Progress Notes (Signed)
Disability letter from Dr. Sondra Come given to [facility] sister-in-law, Almira Bar.

## 2014-11-03 NOTE — Telephone Encounter (Signed)
Called Kerry Peck to see how he is feeling today.  Kerry Peck said the increase in pain medication is helping and he was able to sleep last night.  Advised him to only take 1 (40 mg) tablet of OxyContin every 12 hours.  His sister in law, Kerry Peck, is concerned that he is going to take two at a time.  Kerry Peck verbalized he will take 1 OxyContin tablet in the morning and 1 at bedtime.

## 2014-11-04 ENCOUNTER — Telehealth: Payer: Self-pay | Admitting: Oncology

## 2014-11-04 ENCOUNTER — Telehealth: Payer: Self-pay | Admitting: *Deleted

## 2014-11-04 NOTE — Telephone Encounter (Signed)
Kerry Peck called asking about the rest of his disability paperwork.  Advised him that we are working on gathering the information together.  Advised him that I would call him on Monday to let him when it will be ready.  He reports that he has been sleeping a lot.  He said he is taking OxyContin 40 mg every 12 hours and has not had to take the percocet very often in between.  He is wondering if he can drink protein shakes.  Advised him that he can drink ensure, boost, soups and whatever sounds good to him.  Also advised him to take compazine as directed for nausea.  Kerry Peck verbalized understanding.

## 2014-11-04 NOTE — Telephone Encounter (Signed)
CALLED PATIENT TO INFORM OF NUTRITION APPT. ON 11-10-14 @ 4 PM, SPOKE WITH PATIENT AND HE IS  AWARE OF THIS APPT.

## 2014-11-05 ENCOUNTER — Encounter (HOSPITAL_COMMUNITY): Payer: Self-pay | Admitting: Emergency Medicine

## 2014-11-05 ENCOUNTER — Emergency Department (HOSPITAL_COMMUNITY)
Admission: EM | Admit: 2014-11-05 | Discharge: 2014-11-06 | Discharge status: Against Medical Advice | Payer: Federal, State, Local not specified - PPO | Attending: Emergency Medicine | Admitting: Emergency Medicine

## 2014-11-05 DIAGNOSIS — I10 Essential (primary) hypertension: Secondary | ICD-10-CM | POA: Insufficient documentation

## 2014-11-05 DIAGNOSIS — M542 Cervicalgia: Secondary | ICD-10-CM | POA: Diagnosis not present

## 2014-11-05 DIAGNOSIS — E119 Type 2 diabetes mellitus without complications: Secondary | ICD-10-CM | POA: Insufficient documentation

## 2014-11-05 DIAGNOSIS — M79605 Pain in left leg: Secondary | ICD-10-CM | POA: Insufficient documentation

## 2014-11-05 NOTE — ED Notes (Addendum)
Pt states he is a cancer pt and he just finished chemo in Jan. Pt reports he was sitting and felt a sharp pain in left leg. Pt reports pain in back of neck.

## 2014-11-06 NOTE — ED Notes (Signed)
Pt loud and cursing staff in lobby due to lengthy wait. Pt states he has seen others go back before him and not come back out. ED process was explained to pt. Pt was not satisfied with explanation and stated he was going to leave. Pt encouraged to stay and staff would work on getting a bed. Charge nurse aware.

## 2014-11-08 ENCOUNTER — Encounter: Payer: Self-pay | Admitting: Nutrition

## 2014-11-08 ENCOUNTER — Other Ambulatory Visit: Payer: Self-pay | Admitting: Medical Oncology

## 2014-11-08 ENCOUNTER — Telehealth: Payer: Self-pay | Admitting: Internal Medicine

## 2014-11-08 ENCOUNTER — Encounter: Payer: Federal, State, Local not specified - PPO | Admitting: Nutrition

## 2014-11-08 DIAGNOSIS — C3490 Malignant neoplasm of unspecified part of unspecified bronchus or lung: Secondary | ICD-10-CM

## 2014-11-08 NOTE — Telephone Encounter (Signed)
lvm for patient regarding to march appt....mailed patient appt sched and letter

## 2014-11-08 NOTE — Progress Notes (Signed)
Patient has nutrition appointment scheduled for Thursday, March 17.  Received a phone call.  The patient wishes to cancel appointment.  He did not reschedule.

## 2014-11-10 ENCOUNTER — Encounter: Payer: Federal, State, Local not specified - PPO | Admitting: Nutrition

## 2014-11-11 ENCOUNTER — Telehealth: Payer: Self-pay | Admitting: Oncology

## 2014-11-11 ENCOUNTER — Other Ambulatory Visit: Payer: Self-pay | Admitting: Radiation Oncology

## 2014-11-11 MED ORDER — OXYCODONE-ACETAMINOPHEN 10-325 MG PO TABS: 1.0000 | ORAL_TABLET | ORAL | Status: DC | PRN

## 2014-11-11 NOTE — Telephone Encounter (Signed)
Called Kerry Peck to let him know the prescription is ready for pick up in the Radiation nursing area.  Kerry Peck verbalized agreement.

## 2014-11-11 NOTE — Telephone Encounter (Signed)
Kerry Peck called requesting a refill on oxyCODONE-acetaminophen (PERCOCET) 10-325 MG per tablet.  He said he has 11-12 tablets left and is taking 3 tablets per day.  He last had it filled on 11/02/14.

## 2014-11-16 ENCOUNTER — Ambulatory Visit (HOSPITAL_COMMUNITY)
Admission: RE | Admit: 2014-11-16 | Discharge: 2014-11-16 | Disposition: A | Discharge status: Home / Self Care | Payer: Federal, State, Local not specified - PPO | Source: Ambulatory Visit | Attending: Internal Medicine | Admitting: Internal Medicine

## 2014-11-16 ENCOUNTER — Encounter (HOSPITAL_COMMUNITY): Payer: Self-pay

## 2014-11-16 DIAGNOSIS — E119 Type 2 diabetes mellitus without complications: Secondary | ICD-10-CM | POA: Insufficient documentation

## 2014-11-16 DIAGNOSIS — I10 Essential (primary) hypertension: Secondary | ICD-10-CM | POA: Insufficient documentation

## 2014-11-16 DIAGNOSIS — C7951 Secondary malignant neoplasm of bone: Secondary | ICD-10-CM | POA: Insufficient documentation

## 2014-11-16 DIAGNOSIS — C3490 Malignant neoplasm of unspecified part of unspecified bronchus or lung: Secondary | ICD-10-CM | POA: Diagnosis not present

## 2014-11-16 DIAGNOSIS — C787 Secondary malignant neoplasm of liver and intrahepatic bile duct: Secondary | ICD-10-CM | POA: Diagnosis not present

## 2014-11-16 MED ORDER — IOHEXOL 300 MG/ML  SOLN
100.0000 mL | Freq: Once | INTRAMUSCULAR | Status: AC | PRN
  Administered 2014-11-16: 100 mL via INTRAVENOUS

## 2014-11-17 ENCOUNTER — Telehealth: Payer: Self-pay | Admitting: Oncology

## 2014-11-17 NOTE — Telephone Encounter (Signed)
Zoey called and said he needed fenofibrate refilled.  Advised him that Dr. Laney Pastor had filled that in the past and would need to refill it.  Cleaven verbalized agreement.  Reminded him of his appointment with Dr. Sondra Come on Monday at 4 pm.  Toryn said he was aware of the appointment.

## 2014-11-21 ENCOUNTER — Ambulatory Visit
Admission: RE | Admit: 2014-11-21 | Discharge: 2014-11-21 | Disposition: A | Discharge status: Home / Self Care | Payer: Federal, State, Local not specified - PPO | Source: Ambulatory Visit | Attending: Radiation Oncology | Admitting: Radiation Oncology

## 2014-11-21 ENCOUNTER — Ambulatory Visit: Payer: Federal, State, Local not specified - PPO | Admitting: Oncology

## 2014-11-21 ENCOUNTER — Telehealth: Payer: Self-pay | Admitting: Internal Medicine

## 2014-11-21 ENCOUNTER — Encounter: Payer: Self-pay | Admitting: Radiation Oncology

## 2014-11-21 ENCOUNTER — Other Ambulatory Visit: Payer: Self-pay | Admitting: Oncology

## 2014-11-21 ENCOUNTER — Other Ambulatory Visit: Payer: Federal, State, Local not specified - PPO

## 2014-11-21 VITALS — BP 94/61 | HR 104 | Temp 98.0°F | Resp 16 | Wt 199.5 lb

## 2014-11-21 DIAGNOSIS — C3490 Malignant neoplasm of unspecified part of unspecified bronchus or lung: Secondary | ICD-10-CM

## 2014-11-21 MED ORDER — OXYCODONE HCL ER 40 MG PO T12A: 40.0000 mg | EXTENDED_RELEASE_TABLET | Freq: Two times a day (BID) | ORAL | Status: DC

## 2014-11-21 MED ORDER — OXYCODONE-ACETAMINOPHEN 10-325 MG PO TABS: 1.0000 | ORAL_TABLET | ORAL | Status: DC | PRN

## 2014-11-21 NOTE — Telephone Encounter (Signed)
Left message to confirm appointment reschedule for 04/08

## 2014-11-21 NOTE — Progress Notes (Signed)
Radiation Oncology         [phone] ________________________________  Name: Kerry Peck MRN: [id number]  Date: 11/21/2014  DOB: 1960-11-18  Follow-Up Visit Note  CC: Lujean Amel, MD  Curt Bears, MD    ICD-9-CM ICD-10-CM   1. Small cell lung cancer, unspecified laterality 162.9 C34.90 CBC with Differential     Comprehensive metabolic panel     MR Thoracic Spine W Contrast    Diagnosis:   Extensive stage small cell lung cancer  Interval Since Last Radiation:  5  weeks  Narrative:  The patient returns today for routine follow-up.  He continues to have diffuse pain complaints. He feels his current pain medication regimen is not helping that well. Patient has applied for retirement given his overall situation. He is accompanied by his sister on evaluation today. The patient is quite fatigued and rides  in wheelchair. He missed his blood work earlier today and this will be rescheduled for tomorrow. Recent imaging shows progressive disease with new liver metastasis. The patient will likely proceed with systemic chemotherapy in the near future concerning this issue. Patient's chest CT scan shows significant involvement at T4 and T10 and have scheduled a MRI of the thoracic spine to further evaluate this issue. Patient denies any bowel or bladder incontinence or numbness in his lower extremities.                              ALLERGIES:  has No Known Allergies.  Meds: Current Outpatient Prescriptions  Medication Sig Dispense Refill  . albuterol (PROVENTIL HFA;VENTOLIN HFA) 108 (90 BASE) MCG/ACT inhaler Inhale 2 puffs into the lungs every 6 (six) hours as needed for wheezing. 1 Inhaler 0  . amLODipine (NORVASC) 10 MG tablet Take 10 mg by mouth daily.    Marland Kitchen aspirin 325 MG tablet Take 325 mg by mouth every Wednesday.    Marland Kitchen atorvastatin (LIPITOR) 40 MG tablet Take 40 mg by mouth daily.    . Blood Pressure Monitoring (5 SERIES BP MONITOR) DEVI     . calcium-vitamin D (OSCAL WITH D)  500-200 MG-UNIT per tablet Take 1 tablet by mouth daily with breakfast.     . esomeprazole (NEXIUM) 20 MG capsule Take 20 mg by mouth daily as needed.    . fenofibrate 160 MG tablet Take 160 mg by mouth daily.    . Fluticasone-Salmeterol (ADVAIR) 250-50 MCG/DOSE AEPB Inhale 1 puff into the lungs every 12 (twelve) hours.    . metoprolol tartrate (LOPRESSOR) 25 MG tablet Take 25 mg by mouth daily.     . Multiple Vitamins-Minerals (CENTRUM SILVER ADULT 50+ PO) Take 1 tablet by mouth daily.     . naproxen sodium (ANAPROX) 220 MG tablet Take 220 mg by mouth 2 (two) times daily as needed (pain). Takes 2 tablets bid    . ONE TOUCH ULTRA TEST test strip     . OxyCODONE (OXYCONTIN) 40 mg T12A 12 hr tablet Take 1 tablet (40 mg total) by mouth every 12 (twelve) hours. 60 tablet 0  . oxyCODONE-acetaminophen (PERCOCET) 10-325 MG per tablet Take 1 tablet by mouth every 4 (four) hours as needed for pain. 30 tablet 0  . prochlorperazine (COMPAZINE) 10 MG tablet Take 1 tablet (10 mg total) by mouth every 6 (six) hours as needed for nausea or vomiting. 30 tablet 0  . pyridOXINE (VITAMIN B-6) 100 MG tablet Take 100 mg by mouth daily.    . sucralfate (  CARAFATE) 1 GM/10ML suspension Take 10 mLs (1 g total) by mouth 4 (four) times daily -  with meals and at bedtime. (Patient taking differently: Take 1 g by mouth 4 (four) times daily -  with meals and at bedtime. Called in refill to [facility] per Dr. Sondra Come on 10/31/14.) 420 mL 1  . vitamin C (ASCORBIC ACID) 500 MG tablet Take 500 mg by mouth daily.    Marland Kitchen emollient (BIAFINE) cream Apply topically as needed. (Patient not taking: Reported on 11/21/2014) 454 g 0  . glipiZIDE (GLUCOTROL XL) 5 MG 24 hr tablet   1  . metFORMIN (GLUCOPHAGE) 500 MG tablet Take 500 mg by mouth 2 (two) times daily with a meal.     . pantoprazole (PROTONIX) 40 MG tablet Take 40 mg by mouth daily.      No current facility-administered medications for this encounter.    Physical Findings: The  patient is in no acute distress. Patient is alert and oriented.  weight is 199 lb 8 oz (90.493 kg). His oral temperature is 98 F (36.7 C). His blood pressure is 94/61 and his pulse is 104. His respiration is 16 and oxygen saturation is 95%. . The patient has a pale appearance. He shows some mild confusion. The lungs are clear. The heart has a regular rhythm with a slightly increased rate. The abdomen is soft and nontender with normal bowel sounds. Motor strength is 5 out of 5 in the proximal and distal muscle groups of the upper and lower extremities.  Lab Findings: Lab Results  Component Value Date   WBC 7.5 11/02/2014   HGB 11.1* 11/02/2014   HCT 33.3* 11/02/2014   MCV 92.8 11/02/2014   PLT 224 11/02/2014    Radiographic Findings: Ct Chest W Contrast  11/16/2014   CLINICAL DATA:  Lung cancer restaging. Left-sided chest pain for 1 week. Diagnosed in 2015. Chemotherapy and radiation therapy complete. Small cell type. Diabetes. Hypertension.  EXAM: CT CHEST, ABDOMEN, AND PELVIS WITH CONTRAST  TECHNIQUE: Multidetector CT imaging of the chest, abdomen and pelvis was performed following the standard protocol during bolus administration of intravenous contrast.  CONTRAST:  14mL OMNIPAQUE IOHEXOL 300 MG/ML  SOLN  COMPARISON:  09/09/2014  FINDINGS: CT CHEST FINDINGS  Mediastinum/Nodes: No supraclavicular adenopathy. Extensive aortic atherosclerosis. Normal heart size, without pericardial effusion. Multivessel coronary artery atherosclerosis. No central pulmonary embolism, on this non-dedicated study. No mediastinal adenopathy. Decrease in left hilar adenopathy. 1.4 x 1.4 cm node on image 31 is decreased from 2.5 x 2.2 cm on the prior. No right hilar adenopathy.  Lungs/Pleura: Trace left pleural fluid is new. Secretions along the trachea. Moderate centrilobular emphysema.  Left lower lobe spiculated pulmonary nodule measures 1.8 x 1.6 cm on image 37 and is felt to be similar to 2.2 x 1.4 cm on the prior.   Minimal nodularity along the left major fissure on image 34 is new. Nonspecific.  Musculoskeletal: Progressive osseous metastasis. Posterior vertebral body tumor at T4 and T10 encroach upon the ventral canal. Example sagittal image 61. Axial images 43 and 67.  CT ABDOMEN PELVIS FINDINGS  Hepatobiliary: Development of hepatic metastasis. Example right hepatic lobe 2.1 cm lesion on image 64.  Lateral segment left liver lobe 2.3 cm lesion on image 53. Resolved hepatic steatosis.  Cholelithiasis without acute cholecystitis or biliary ductal dilatation.  Pancreas: Hypo attenuating lesion in the pancreatic tail measures 11 mm on image 65 and may have increased minimally from the prior exam. Suspicious for a metastasis.  Spleen: Normal  Adrenals/Urinary Tract: Normal adrenal glands. Too small to characterize interpolar left renal lesion. Normal right kidney, without hydronephrosis. Possible bladder wall thickening, mild.  Stomach/Bowel: Normal stomach, without wall thickening. Normal colon, appendix, and terminal ileum. Normal small bowel.  Vascular/Lymphatic: Advanced aortic and branch vessel atherosclerosis. Developing retroperitoneal adenopathy, including a left periaortic 1.4 cm node on image 75. Likely necrotic portacaval node at 1.7 cm on image 66. No pelvic adenopathy.  Reproductive: Normal prostate.  Other: No significant free fluid. No evidence of omental or peritoneal disease. Tiny fat containing bilateral inguinal hernias.  Musculoskeletal: Widespread osseous metastasis. Progressive. Right iliac lesion is enlarged with cortical destruction/pathologic fracture. Ill-defined lytic metastasis throughout the sacrum is progressive. Soft tissue component extends anterior to the left side of the sacrum including on image 103. Markedly progressive thoracolumbar vertebral metastatic disease. Tumor involvement involving the posterior aspect of L2 extends minimally into the ventral canal. Multiple compression deformities.  Similar.  IMPRESSION: CT CHEST IMPRESSION  1. Similar left lower lobe pulmonary nodule with decrease in left hilar adenopathy. 2. Progressive osseous metastasis. Tumor canal encroachment involving the thoracic spine. Consider further evaluation with pre and post contrast thoracic spine MRI. 3.  Atherosclerosis, including within the coronary arteries. 4. New trace left pleural fluid.  CT ABDOMEN AND PELVIS IMPRESSION  1. Development of hepatic metastasis. 2. Developing nodal metastasis within the upper abdomen. 3. Progressive osseous metastasis. Lumbar vertebral canal encroachment would also be better evaluated with pre and post contrast lumbar spine MRI. 4. Hypo attenuating pancreatic lesion is suspicious for metastasis. Similar to minimally progressive. 5. Cholelithiasis. 6. Possible mild bladder wall thickening. Correlate with symptoms to suggest cystitis or bladder outlet obstruction.   Electronically Signed   By: Abigail Miyamoto M.D.   On: 11/16/2014 10:56   Ct Abdomen Pelvis W Contrast  11/16/2014   CLINICAL DATA:  Lung cancer restaging. Left-sided chest pain for 1 week. Diagnosed in 2015. Chemotherapy and radiation therapy complete. Small cell type. Diabetes. Hypertension.  EXAM: CT CHEST, ABDOMEN, AND PELVIS WITH CONTRAST  TECHNIQUE: Multidetector CT imaging of the chest, abdomen and pelvis was performed following the standard protocol during bolus administration of intravenous contrast.  CONTRAST:  123mL OMNIPAQUE IOHEXOL 300 MG/ML  SOLN  COMPARISON:  09/09/2014  FINDINGS: CT CHEST FINDINGS  Mediastinum/Nodes: No supraclavicular adenopathy. Extensive aortic atherosclerosis. Normal heart size, without pericardial effusion. Multivessel coronary artery atherosclerosis. No central pulmonary embolism, on this non-dedicated study. No mediastinal adenopathy. Decrease in left hilar adenopathy. 1.4 x 1.4 cm node on image 31 is decreased from 2.5 x 2.2 cm on the prior. No right hilar adenopathy.  Lungs/Pleura: Trace  left pleural fluid is new. Secretions along the trachea. Moderate centrilobular emphysema.  Left lower lobe spiculated pulmonary nodule measures 1.8 x 1.6 cm on image 37 and is felt to be similar to 2.2 x 1.4 cm on the prior.  Minimal nodularity along the left major fissure on image 34 is new. Nonspecific.  Musculoskeletal: Progressive osseous metastasis. Posterior vertebral body tumor at T4 and T10 encroach upon the ventral canal. Example sagittal image 61. Axial images 43 and 67.  CT ABDOMEN PELVIS FINDINGS  Hepatobiliary: Development of hepatic metastasis. Example right hepatic lobe 2.1 cm lesion on image 64.  Lateral segment left liver lobe 2.3 cm lesion on image 53. Resolved hepatic steatosis.  Cholelithiasis without acute cholecystitis or biliary ductal dilatation.  Pancreas: Hypo attenuating lesion in the pancreatic tail measures 11 mm on image 65 and may have increased minimally  from the prior exam. Suspicious for a metastasis.  Spleen: Normal  Adrenals/Urinary Tract: Normal adrenal glands. Too small to characterize interpolar left renal lesion. Normal right kidney, without hydronephrosis. Possible bladder wall thickening, mild.  Stomach/Bowel: Normal stomach, without wall thickening. Normal colon, appendix, and terminal ileum. Normal small bowel.  Vascular/Lymphatic: Advanced aortic and branch vessel atherosclerosis. Developing retroperitoneal adenopathy, including a left periaortic 1.4 cm node on image 75. Likely necrotic portacaval node at 1.7 cm on image 66. No pelvic adenopathy.  Reproductive: Normal prostate.  Other: No significant free fluid. No evidence of omental or peritoneal disease. Tiny fat containing bilateral inguinal hernias.  Musculoskeletal: Widespread osseous metastasis. Progressive. Right iliac lesion is enlarged with cortical destruction/pathologic fracture. Ill-defined lytic metastasis throughout the sacrum is progressive. Soft tissue component extends anterior to the left side of the  sacrum including on image 103. Markedly progressive thoracolumbar vertebral metastatic disease. Tumor involvement involving the posterior aspect of L2 extends minimally into the ventral canal. Multiple compression deformities. Similar.  IMPRESSION: CT CHEST IMPRESSION  1. Similar left lower lobe pulmonary nodule with decrease in left hilar adenopathy. 2. Progressive osseous metastasis. Tumor canal encroachment involving the thoracic spine. Consider further evaluation with pre and post contrast thoracic spine MRI. 3.  Atherosclerosis, including within the coronary arteries. 4. New trace left pleural fluid.  CT ABDOMEN AND PELVIS IMPRESSION  1. Development of hepatic metastasis. 2. Developing nodal metastasis within the upper abdomen. 3. Progressive osseous metastasis. Lumbar vertebral canal encroachment would also be better evaluated with pre and post contrast lumbar spine MRI. 4. Hypo attenuating pancreatic lesion is suspicious for metastasis. Similar to minimally progressive. 5. Cholelithiasis. 6. Possible mild bladder wall thickening. Correlate with symptoms to suggest cystitis or bladder outlet obstruction.   Electronically Signed   By: Abigail Miyamoto M.D.   On: 11/16/2014 10:56    Impression:  The patient is recovering from the effects of radiation.  His left hilar mass did respond to radiation treatments. He is having less pain in the right pelvis area and right lower rib cage which was recently treated. Overall progression on recent scans with liver metastasis  Plan:  Blood work tomorrow and follow-up with medical oncology in the near future to likely proceed with additional chemotherapy. Patient may require palliative radiation therapy to his thoracic spine if significant disease is noted on MRI.  ____________________________________ Blair Promise, MD

## 2014-11-21 NOTE — Progress Notes (Signed)
Patient missed follow up appointment and lab work with Altamese Dilling, NP. Patient understands he needs to reschedule this appt. Patient has lost 15 lb since 11/02/2014. Patient reports a decreased appetite, nausea and vomiting. Reports taking compazine but, denies this helps. Will questions Dr. Sondra Come about Zofran. Patient reports attempting to drink one can of BOOST per day. Patient is orthostatic.   Sitting heart rate 88 bp 110/70 Standing heart rate 104 bp 94/61  Color ashen. Denies feeling lightheaded or dizziness. Denies headache. Reports an infrequent cough that is occasionally productive. Denies hemoptysis. Denies difficulty swallowing. Reports low back and buttock is sore and keeps him from sleeping. Reports SOB with exertion. Uses walker or cane at home to ambulate.

## 2014-11-22 ENCOUNTER — Telehealth: Payer: Self-pay | Admitting: Oncology

## 2014-11-22 ENCOUNTER — Telehealth: Payer: Self-pay | Admitting: Internal Medicine

## 2014-11-22 ENCOUNTER — Telehealth: Payer: Self-pay | Admitting: *Deleted

## 2014-11-22 NOTE — Telephone Encounter (Signed)
CALLED PATIENT TO INFORM OF FU WITH DR, MOHAMED ON 11-23-14- ARRIVAL TIME - 9:20 AM, SPOKE WITH PATIENT AND HE IS AWARE OF THIS APPT.

## 2014-11-22 NOTE — Telephone Encounter (Signed)
Kerry Peck called and said Dr. Sondra Come was going to call in a prescription for Zofran.  Per Dr. Sondra Come a script will be sent in today.  Called [name] back and let him know. He requested that I call his friend [name], regarding his 9:30 appointment time with Dr Julien Nordmann tomorrow.  Per [name], she will make sure he is at the 9:30 appointment tomorrow.

## 2014-11-22 NOTE — Telephone Encounter (Signed)
Received a call from [place] at [facility] saying that [facility] is needing to fill Oxycodone 40 mg but it is 8 days early.  Per Dr. Sondra Come, it is Ok to fill today.  Called Corrigan and asked how he is taking the Oxycodone 40 mg.  He reports he is taking 1 tablet in the morning and 1 at night.  He reports he has 1 left.

## 2014-11-22 NOTE — Telephone Encounter (Signed)
CALLED PATIENT TO INFORM OF MRI ON 12-01-14- ARRIVAL TIME - 11:45 AM @ [facility], NO RESTRICTIONS, LVM FOR A RETURN CALL

## 2014-11-22 NOTE — Telephone Encounter (Signed)
Kerry Peck from radiation called to sched pt with appt...ok and aware

## 2014-11-22 NOTE — Telephone Encounter (Signed)
sherlie from radiation called to sched pt with appt...ok and aware

## 2014-11-23 ENCOUNTER — Ambulatory Visit (HOSPITAL_COMMUNITY)
Admission: RE | Admit: 2014-11-23 | Discharge: 2014-11-23 | Disposition: A | Discharge status: Home / Self Care | Payer: Federal, State, Local not specified - PPO | Source: Ambulatory Visit | Attending: Radiation Oncology | Admitting: Radiation Oncology

## 2014-11-23 ENCOUNTER — Telehealth: Payer: Self-pay | Admitting: Internal Medicine

## 2014-11-23 ENCOUNTER — Ambulatory Visit (HOSPITAL_BASED_OUTPATIENT_CLINIC_OR_DEPARTMENT_OTHER): Payer: Federal, State, Local not specified - PPO | Admitting: Internal Medicine

## 2014-11-23 ENCOUNTER — Other Ambulatory Visit: Payer: Self-pay | Admitting: Nurse Practitioner

## 2014-11-23 ENCOUNTER — Encounter: Payer: Self-pay | Admitting: Internal Medicine

## 2014-11-23 ENCOUNTER — Other Ambulatory Visit (HOSPITAL_BASED_OUTPATIENT_CLINIC_OR_DEPARTMENT_OTHER): Payer: Federal, State, Local not specified - PPO

## 2014-11-23 ENCOUNTER — Telehealth: Payer: Self-pay | Admitting: Medical Oncology

## 2014-11-23 ENCOUNTER — Other Ambulatory Visit: Payer: Self-pay | Admitting: Medical Oncology

## 2014-11-23 ENCOUNTER — Ambulatory Visit (HOSPITAL_BASED_OUTPATIENT_CLINIC_OR_DEPARTMENT_OTHER): Payer: Federal, State, Local not specified - PPO

## 2014-11-23 ENCOUNTER — Other Ambulatory Visit: Payer: Self-pay | Admitting: Radiation Oncology

## 2014-11-23 VITALS — BP 107/71 | HR 111 | Temp 98.2°F | Resp 18 | Ht 73.0 in | Wt 200.2 lb

## 2014-11-23 DIAGNOSIS — G893 Neoplasm related pain (acute) (chronic): Secondary | ICD-10-CM | POA: Diagnosis not present

## 2014-11-23 DIAGNOSIS — M6281 Muscle weakness (generalized): Secondary | ICD-10-CM

## 2014-11-23 DIAGNOSIS — C7B8 Other secondary neuroendocrine tumors: Secondary | ICD-10-CM

## 2014-11-23 DIAGNOSIS — C7A1 Malignant poorly differentiated neuroendocrine tumors: Secondary | ICD-10-CM | POA: Diagnosis not present

## 2014-11-23 DIAGNOSIS — C7951 Secondary malignant neoplasm of bone: Secondary | ICD-10-CM

## 2014-11-23 DIAGNOSIS — C3490 Malignant neoplasm of unspecified part of unspecified bronchus or lung: Secondary | ICD-10-CM

## 2014-11-23 DIAGNOSIS — R5383 Other fatigue: Secondary | ICD-10-CM

## 2014-11-23 DIAGNOSIS — M8448XA Pathological fracture, other site, initial encounter for fracture: Secondary | ICD-10-CM | POA: Diagnosis not present

## 2014-11-23 HISTORY — DX: Secondary malignant neoplasm of bone: C79.51

## 2014-11-23 LAB — CBC WITH DIFFERENTIAL/PLATELET
BASO%: 0.3 % (ref 0.0–2.0)
Basophils Absolute: 0 10*3/uL (ref 0.0–0.1)
EOS%: 9.8 % — ABNORMAL HIGH (ref 0.0–7.0)
Eosinophils Absolute: 0.6 10*3/uL — ABNORMAL HIGH (ref 0.0–0.5)
HCT: 32.3 % — ABNORMAL LOW (ref 38.4–49.9)
HGB: 10.8 g/dL — ABNORMAL LOW (ref 13.0–17.1)
LYMPH%: 11.2 % — AB (ref 14.0–49.0)
MCH: 31.1 pg (ref 27.2–33.4)
MCHC: 33.4 g/dL (ref 32.0–36.0)
MCV: 93.1 fL (ref 79.3–98.0)
MONO#: 0.6 10*3/uL (ref 0.1–0.9)
MONO%: 8.4 % (ref 0.0–14.0)
NEUT#: 4.6 10*3/uL (ref 1.5–6.5)
NEUT%: 70.3 % (ref 39.0–75.0)
Platelets: 189 10*3/uL (ref 140–400)
RBC: 3.47 10*6/uL — AB (ref 4.20–5.82)
RDW: 14.2 % (ref 11.0–14.6)
WBC: 6.5 10*3/uL (ref 4.0–10.3)
lymph#: 0.7 10*3/uL — ABNORMAL LOW (ref 0.9–3.3)
nRBC: 0 % (ref 0–0)

## 2014-11-23 LAB — COMPREHENSIVE METABOLIC PANEL (CC13)
ALBUMIN: 3.2 g/dL — AB (ref 3.5–5.0)
ALT: 28 U/L (ref 0–55)
AST: 94 U/L — ABNORMAL HIGH (ref 5–34)
Alkaline Phosphatase: 157 U/L — ABNORMAL HIGH (ref 40–150)
Anion Gap: 13 mEq/L — ABNORMAL HIGH (ref 3–11)
BUN: 15.4 mg/dL (ref 7.0–26.0)
CO2: 25 mEq/L (ref 22–29)
Calcium: 12.3 mg/dL — ABNORMAL HIGH (ref 8.4–10.4)
Chloride: 103 mEq/L (ref 98–109)
Creatinine: 0.8 mg/dL (ref 0.7–1.3)
EGFR: >90 mL/min/{1.73_m2} (ref >90)
GLUCOSE: 145 mg/dL — AB (ref 70–140)
Potassium: 3.8 mEq/L (ref 3.5–5.1)
Sodium: 141 mEq/L (ref 136–145)
TOTAL PROTEIN: 7.8 g/dL (ref 6.4–8.3)
Total Bilirubin: 0.59 mg/dL (ref 0.20–1.20)

## 2014-11-23 MED ORDER — DEXAMETHASONE 4 MG PO TABS: 4.0000 mg | ORAL_TABLET | Freq: Four times a day (QID) | ORAL | Status: DC

## 2014-11-23 MED ORDER — SODIUM CHLORIDE 0.9 % IV SOLN: Freq: Once | INTRAVENOUS | Status: DC

## 2014-11-23 MED ORDER — MORPHINE SULFATE 4 MG/ML IJ SOLN
2.0000 mg | INTRAMUSCULAR | Status: DC | PRN
  Administered 2014-11-23: 2 mg via INTRAVENOUS

## 2014-11-23 MED ORDER — SODIUM CHLORIDE 0.9 % IV SOLN
1000.0000 mL | Freq: Once | INTRAVENOUS | Status: AC
  Administered 2014-11-23: 1000 mL via INTRAVENOUS

## 2014-11-23 MED ORDER — ZOLEDRONIC ACID 4 MG/100ML IV SOLN
4.0000 mg | Freq: Once | INTRAVENOUS | Status: AC
  Administered 2014-11-23: 4 mg via INTRAVENOUS
  Filled 2014-11-23: qty 100

## 2014-11-23 MED ORDER — MORPHINE SULFATE 4 MG/ML IJ SOLN
INTRAMUSCULAR | Status: AC
Start: 2014-11-23 — End: 2014-11-23
  Filled 2014-11-23: qty 1

## 2014-11-23 MED ORDER — GADOBENATE DIMEGLUMINE 529 MG/ML IV SOLN
20.0000 mL | Freq: Once | INTRAVENOUS | Status: AC | PRN
  Administered 2014-11-23: 20 mL via INTRAVENOUS

## 2014-11-23 NOTE — Telephone Encounter (Signed)
gave and printed appt sched and avs for pt for 3.30 thru April

## 2014-11-23 NOTE — Patient Instructions (Signed)
Dehydration, Adult Dehydration is when you lose more fluids from the body than you take in. Vital organs like the kidneys, brain, and heart cannot function without a proper amount of fluids and salt. Any loss of fluids from the body can cause dehydration.  CAUSES   Vomiting.  Diarrhea.  Excessive sweating.  Excessive urine output.  Fever. SYMPTOMS  Mild dehydration  Thirst.  Dry lips.  Slightly dry mouth. Moderate dehydration  Very dry mouth.  Sunken eyes.  Skin does not bounce back quickly when lightly pinched and released.  Dark urine and decreased urine production.  Decreased tear production.  Headache. Severe dehydration  Very dry mouth.  Extreme thirst.  Rapid, weak pulse (more than 100 beats per minute at rest).  Cold hands and feet.  Not able to sweat in spite of heat and temperature.  Rapid breathing.  Blue lips.  Confusion and lethargy.  Difficulty being awakened.  Minimal urine production.  No tears. DIAGNOSIS  Your caregiver will diagnose dehydration based on your symptoms and your exam. Blood and urine tests will help confirm the diagnosis. The diagnostic evaluation should also identify the cause of dehydration. TREATMENT  Treatment of mild or moderate dehydration can often be done at home by increasing the amount of fluids that you drink. It is best to drink small amounts of fluid more often. Drinking too much at one time can make vomiting worse. Refer to the home care instructions below. Severe dehydration needs to be treated at the hospital where you will probably be given intravenous (IV) fluids that contain water and electrolytes. HOME CARE INSTRUCTIONS   Ask your caregiver about specific rehydration instructions.  Drink enough fluids to keep your urine clear or pale yellow.  Drink small amounts frequently if you have nausea and vomiting.  Eat as you normally do.  Avoid:  Foods or drinks high in sugar.  Carbonated  drinks.  Juice.  Extremely hot or cold fluids.  Drinks with caffeine.  Fatty, greasy foods.  Alcohol.  Tobacco.  Overeating.  Gelatin desserts.  Wash your hands well to avoid spreading bacteria and viruses.  Only take over-the-counter or prescription medicines for pain, discomfort, or fever as directed by your caregiver.  Ask your caregiver if you should continue all prescribed and over-the-counter medicines.  Keep all follow-up appointments with your caregiver. SEEK MEDICAL CARE IF:  You have abdominal pain and it increases or stays in one area (localizes).  You have a rash, stiff neck, or severe headache.  You are irritable, sleepy, or difficult to awaken.  You are weak, dizzy, or extremely thirsty. SEEK IMMEDIATE MEDICAL CARE IF:   You are unable to keep fluids down or you get worse despite treatment.  You have frequent episodes of vomiting or diarrhea.  You have blood or green matter (bile) in your vomit.  You have blood in your stool or your stool looks black and tarry.  You have not urinated in 6 to 8 hours, or you have only urinated a small amount of very dark urine.  You have a fever.  You faint. MAKE SURE YOU:   Understand these instructions.  Will watch your condition.  Will get help right away if you are not doing well or get worse. Document Released: 08/12/2005 Document Revised: 11/04/2011 Document Reviewed: 04/01/2011 [employer] Patient Information 2015 [employer]. This information is not intended to replace advice given to you by your health care provider. Make sure you discuss any questions you have with your health care   provider.  Zoledronic Acid injection (Hypercalcemia, Oncology) (Zometa) What is this medicine? ZOLEDRONIC ACID (ZOE le dron ik AS id) lowers the amount of calcium loss from bone. It is used to treat too much calcium in your blood from cancer. It is also used to prevent complications of cancer that has spread to the  bone. This medicine may be used for other purposes; ask your health care provider or pharmacist if you have questions. COMMON BRAND NAME(S): Zometa What should I tell my health care provider before I take this medicine? They need to know if you have any of these conditions: -aspirin-sensitive asthma -cancer, especially if you are receiving medicines used to treat cancer -dental disease or wear dentures -infection -kidney disease -receiving corticosteroids like dexamethasone or prednisone -an unusual or allergic reaction to zoledronic acid, other medicines, foods, dyes, or preservatives -pregnant or trying to get pregnant -breast-feeding How should I use this medicine? This medicine is for infusion into a vein. It is given by a health care professional in a hospital or clinic setting. Talk to your pediatrician regarding the use of this medicine in children. Special care may be needed. Overdosage: If you think you have taken too much of this medicine contact a poison control center or emergency room at once. NOTE: This medicine is only for you. Do not share this medicine with others. What if I miss a dose? It is important not to miss your dose. Call your doctor or health care professional if you are unable to keep an appointment. What may interact with this medicine? -certain antibiotics given by injection -NSAIDs, medicines for pain and inflammation, like ibuprofen or naproxen -some diuretics like bumetanide, furosemide -teriparatide -thalidomide This list may not describe all possible interactions. Give your health care provider a list of all the medicines, herbs, non-prescription drugs, or dietary supplements you use. Also tell them if you smoke, drink alcohol, or use illegal drugs. Some items may interact with your medicine. What should I watch for while using this medicine? Visit your doctor or health care professional for regular checkups. It may be some time before you see the  benefit from this medicine. Do not stop taking your medicine unless your doctor tells you to. Your doctor may order blood tests or other tests to see how you are doing. Women should inform their doctor if they wish to become pregnant or think they might be pregnant. There is a potential for serious side effects to an unborn child. Talk to your health care professional or pharmacist for more information. You should make sure that you get enough calcium and vitamin D while you are taking this medicine. Discuss the foods you eat and the vitamins you take with your health care professional. Some people who take this medicine have severe bone, joint, and/or muscle pain. This medicine may also increase your risk for jaw problems or a broken thigh bone. Tell your doctor right away if you have severe pain in your jaw, bones, joints, or muscles. Tell your doctor if you have any pain that does not go away or that gets worse. Tell your dentist and dental surgeon that you are taking this medicine. You should not have major dental surgery while on this medicine. See your dentist to have a dental exam and fix any dental problems before starting this medicine. Take good care of your teeth while on this medicine. Make sure you see your dentist for regular follow-up appointments. What side effects may I notice from receiving this  medicine? Side effects that you should report to your doctor or health care professional as soon as possible: -allergic reactions like skin rash, itching or hives, swelling of the face, lips, or tongue -anxiety, confusion, or depression -breathing problems -changes in vision -eye pain -feeling faint or lightheaded, falls -jaw pain, especially after dental work -mouth sores -muscle cramps, stiffness, or weakness -trouble passing urine or change in the amount of urine Side effects that usually do not require medical attention (report to your doctor or health care professional if they continue  or are bothersome): -bone, joint, or muscle pain -constipation -diarrhea -fever -hair loss -irritation at site where injected -loss of appetite -nausea, vomiting -stomach upset -trouble sleeping -trouble swallowing -weak or tired This list may not describe all possible side effects. Call your doctor for medical advice about side effects. You may report side effects to FDA at 1-800-FDA-1088. Where should I keep my medicine? This drug is given in a hospital or clinic and will not be stored at home. NOTE: This sheet is a summary. It may not cover all possible information. If you have questions about this medicine, talk to your doctor, pharmacist, or health care provider.  2015, [employer]. (2013-01-21 13:03:13)

## 2014-11-23 NOTE — Telephone Encounter (Signed)
erroneous

## 2014-11-23 NOTE — Progress Notes (Signed)
Patient complains of lower back pain which is constant. Patient repositioned, heat pack applied no relief noted from patient. Selena Lesser, NP notified. Order given and carried out for 2 mg morphine IV push.

## 2014-11-23 NOTE — Progress Notes (Signed)
[place] [phone]   Fax:([phone]) [place], [place] [address] [place] [address]  DIAGNOSIS: Extensive stage, Stage IV (T2a, N1, M1b) small cell lung cancer diagnosed in July of 2015.  PRIOR THERAPY:  1) Systemic chemotherapy with carboplatin for AUC of 5 on day 1 and etoposide at 120 mg/M2 on days 1, 2 and 3 with Neulasta support on day 4. Status post 6 cycles.  2) prophylactic cranial irradiation under the care of Dr. Sondra Come. 3) palliative radiotherapy to the T7-spine completed on 09/06/2014.  CURRENT THERAPY: Observation.  INTERVAL HISTORY: Kerry Peck 54 y.o. male returns to the clinic today for follow up visit. The patient has been observation for the last few months except for the palliative radiation to the T7 spine. Has been complaining recently of increasing back pain as well as generalized weakness and fatigue. He also has shortness breath with exertion He denied having any significant fever or chills, no nausea or vomiting. He denied having any significant weight loss or night sweats. The patient has no significant chest pain but continues to have shortness of breath with exertion with no cough or hemoptysis. He had repeat CT scan of the chest, abdomen and pelvis performed recently and he is here for evaluation and discussion of his scan results.  MEDICAL HISTORY: Past Medical History  Diagnosis Date  . Diabetes mellitus without complication   . Hypertension   . Smoking addiction   . Cancer     lung and liver  . Radiation 08/16/14-09/06/14    prophylatic cranial radiation 25 gray, chest, t-spine  35 gray  . Radiation 09/29/14-10/12/14    left hilar area 30 gray, right pelvis 30 gray, right lower rib cage area 30 gray    ALLERGIES:  has No Known Allergies.  MEDICATIONS:  Current Outpatient Prescriptions  Medication Sig Dispense Refill  . albuterol (PROVENTIL HFA;VENTOLIN  HFA) 108 (90 BASE) MCG/ACT inhaler Inhale 2 puffs into the lungs every 6 (six) hours as needed for wheezing. 1 Inhaler 0  . amLODipine (NORVASC) 10 MG tablet Take 10 mg by mouth daily.    Marland Kitchen aspirin 325 MG tablet Take 325 mg by mouth every Wednesday.    Marland Kitchen atorvastatin (LIPITOR) 40 MG tablet Take 40 mg by mouth daily.    . Blood Pressure Monitoring (5 SERIES BP MONITOR) DEVI     . calcium-vitamin D (OSCAL WITH D) 500-200 MG-UNIT per tablet Take 1 tablet by mouth daily with breakfast.     . emollient (BIAFINE) cream Apply topically as needed. (Patient not taking: Reported on 11/21/2014) 454 g 0  . esomeprazole (NEXIUM) 20 MG capsule Take 20 mg by mouth daily as needed.    . fenofibrate 160 MG tablet Take 160 mg by mouth daily.    . Fluticasone-Salmeterol (ADVAIR) 250-50 MCG/DOSE AEPB Inhale 1 puff into the lungs every 12 (twelve) hours.    Marland Kitchen glipiZIDE (GLUCOTROL XL) 5 MG 24 hr tablet   1  . metFORMIN (GLUCOPHAGE) 500 MG tablet Take 500 mg by mouth 2 (two) times daily with a meal.     . metoprolol tartrate (LOPRESSOR) 25 MG tablet Take 25 mg by mouth daily.     . Multiple Vitamins-Minerals (CENTRUM SILVER ADULT 50+ PO) Take 1 tablet by mouth daily.     . naproxen sodium (ANAPROX) 220 MG tablet Take 220 mg by mouth 2 (two) times daily as needed (pain). Takes 2  tablets bid    . ONE TOUCH ULTRA TEST test strip     . OxyCODONE (OXYCONTIN) 40 mg T12A 12 hr tablet Take 1 tablet (40 mg total) by mouth every 12 (twelve) hours. 60 tablet 0  . oxyCODONE-acetaminophen (PERCOCET) 10-325 MG per tablet Take 1 tablet by mouth every 4 (four) hours as needed for pain. 30 tablet 0  . pantoprazole (PROTONIX) 40 MG tablet Take 40 mg by mouth daily.     . prochlorperazine (COMPAZINE) 10 MG tablet Take 1 tablet (10 mg total) by mouth every 6 (six) hours as needed for nausea or vomiting. 30 tablet 0  . pyridOXINE (VITAMIN B-6) 100 MG tablet Take 100 mg by mouth daily.    . sucralfate (CARAFATE) 1 GM/10ML suspension Take 10  mLs (1 g total) by mouth 4 (four) times daily -  with meals and at bedtime. (Patient taking differently: Take 1 g by mouth 4 (four) times daily -  with meals and at bedtime. Called in refill to [facility] per Dr. Sondra Come on 10/31/14.) 420 mL 1  . vitamin C (ASCORBIC ACID) 500 MG tablet Take 500 mg by mouth daily.     No current facility-administered medications for this visit.    REVIEW OF SYSTEMS:  Constitutional: positive for fatigue and weight loss Eyes: negative Ears, nose, mouth, throat, and face: negative Respiratory: positive for dyspnea on exertion Cardiovascular: negative Gastrointestinal: negative Genitourinary:negative Integument/breast: negative Hematologic/lymphatic: negative Musculoskeletal:positive for back pain and bone pain Neurological: negative Behavioral/Psych: negative Endocrine: negative Allergic/Immunologic: negative   PHYSICAL EXAMINATION: General appearance: alert, cooperative and no distress Head: Normocephalic, without obvious abnormality, atraumatic Neck: no adenopathy, no JVD, supple, symmetrical, trachea midline and thyroid not enlarged, symmetric, no tenderness/mass/nodules Lymph nodes: Cervical, supraclavicular, and axillary nodes normal. Resp: clear to auscultation bilaterally Back: symmetric, no curvature. ROM normal. No CVA tenderness. Cardio: regular rate and rhythm, S1, S2 normal, no murmur, click, rub or gallop GI: soft, non-tender; bowel sounds normal; no masses,  no organomegaly Extremities: extremities normal, atraumatic, no cyanosis or edema Neurologic: Alert and oriented X 3, normal strength and tone. Normal symmetric reflexes. Normal coordination and gait  ECOG PERFORMANCE STATUS: 2 - Symptomatic, <50% confined to bed  Blood pressure 107/71, pulse 111, temperature 98.2 F (36.8 C), temperature source Oral, resp. rate 18, height 6\' 1"  (1.854 m), weight 200 lb 3.2 oz (90.81 kg), SpO2 99 %.  LABORATORY DATA: Lab Results  Component Value Date    WBC 7.5 11/02/2014   HGB 11.1* 11/02/2014   HCT 33.3* 11/02/2014   MCV 92.8 11/02/2014   PLT 224 11/02/2014      Chemistry      Component Value Date/Time   NA 139 11/02/2014 1017   NA 136 04/05/2014 1338   K 3.3* 11/02/2014 1017   K 3.9 04/05/2014 1338   CL 101 04/05/2014 1338   CO2 27 11/02/2014 1017   CO2 27 04/05/2014 1338   BUN 18.0 11/02/2014 1017   BUN 10 04/05/2014 1338   CREATININE 0.9 11/02/2014 1017   CREATININE 0.64 04/05/2014 1338      Component Value Date/Time   CALCIUM 14.4* 11/02/2014 1017   CALCIUM 8.9 04/05/2014 1338   ALKPHOS 99 11/02/2014 1017   ALKPHOS 139* 04/05/2014 1338   AST 76* 11/02/2014 1017   AST 13 04/05/2014 1338   ALT 25 11/02/2014 1017   ALT 17 04/05/2014 1338   BILITOT 0.87 11/02/2014 1017   BILITOT 0.5 04/05/2014 1338       RADIOGRAPHIC  STUDIES: Ct Chest W Contrast  11/16/2014   CLINICAL DATA:  Lung cancer restaging. Left-sided chest pain for 1 week. Diagnosed in 2015. Chemotherapy and radiation therapy complete. Small cell type. Diabetes. Hypertension.  EXAM: CT CHEST, ABDOMEN, AND PELVIS WITH CONTRAST  TECHNIQUE: Multidetector CT imaging of the chest, abdomen and pelvis was performed following the standard protocol during bolus administration of intravenous contrast.  CONTRAST:  127mL OMNIPAQUE IOHEXOL 300 MG/ML  SOLN  COMPARISON:  09/09/2014  FINDINGS: CT CHEST FINDINGS  Mediastinum/Nodes: No supraclavicular adenopathy. Extensive aortic atherosclerosis. Normal heart size, without pericardial effusion. Multivessel coronary artery atherosclerosis. No central pulmonary embolism, on this non-dedicated study. No mediastinal adenopathy. Decrease in left hilar adenopathy. 1.4 x 1.4 cm node on image 31 is decreased from 2.5 x 2.2 cm on the prior. No right hilar adenopathy.  Lungs/Pleura: Trace left pleural fluid is new. Secretions along the trachea. Moderate centrilobular emphysema.  Left lower lobe spiculated pulmonary nodule measures 1.8 x 1.6  cm on image 37 and is felt to be similar to 2.2 x 1.4 cm on the prior.  Minimal nodularity along the left major fissure on image 34 is new. Nonspecific.  Musculoskeletal: Progressive osseous metastasis. Posterior vertebral body tumor at T4 and T10 encroach upon the ventral canal. Example sagittal image 61. Axial images 43 and 67.  CT ABDOMEN PELVIS FINDINGS  Hepatobiliary: Development of hepatic metastasis. Example right hepatic lobe 2.1 cm lesion on image 64.  Lateral segment left liver lobe 2.3 cm lesion on image 53. Resolved hepatic steatosis.  Cholelithiasis without acute cholecystitis or biliary ductal dilatation.  Pancreas: Hypo attenuating lesion in the pancreatic tail measures 11 mm on image 65 and may have increased minimally from the prior exam. Suspicious for a metastasis.  Spleen: Normal  Adrenals/Urinary Tract: Normal adrenal glands. Too small to characterize interpolar left renal lesion. Normal right kidney, without hydronephrosis. Possible bladder wall thickening, mild.  Stomach/Bowel: Normal stomach, without wall thickening. Normal colon, appendix, and terminal ileum. Normal small bowel.  Vascular/Lymphatic: Advanced aortic and branch vessel atherosclerosis. Developing retroperitoneal adenopathy, including a left periaortic 1.4 cm node on image 75. Likely necrotic portacaval node at 1.7 cm on image 66. No pelvic adenopathy.  Reproductive: Normal prostate.  Other: No significant free fluid. No evidence of omental or peritoneal disease. Tiny fat containing bilateral inguinal hernias.  Musculoskeletal: Widespread osseous metastasis. Progressive. Right iliac lesion is enlarged with cortical destruction/pathologic fracture. Ill-defined lytic metastasis throughout the sacrum is progressive. Soft tissue component extends anterior to the left side of the sacrum including on image 103. Markedly progressive thoracolumbar vertebral metastatic disease. Tumor involvement involving the posterior aspect of L2  extends minimally into the ventral canal. Multiple compression deformities. Similar.  IMPRESSION: CT CHEST IMPRESSION  1. Similar left lower lobe pulmonary nodule with decrease in left hilar adenopathy. 2. Progressive osseous metastasis. Tumor canal encroachment involving the thoracic spine. Consider further evaluation with pre and post contrast thoracic spine MRI. 3.  Atherosclerosis, including within the coronary arteries. 4. New trace left pleural fluid.  CT ABDOMEN AND PELVIS IMPRESSION  1. Development of hepatic metastasis. 2. Developing nodal metastasis within the upper abdomen. 3. Progressive osseous metastasis. Lumbar vertebral canal encroachment would also be better evaluated with pre and post contrast lumbar spine MRI. 4. Hypo attenuating pancreatic lesion is suspicious for metastasis. Similar to minimally progressive. 5. Cholelithiasis. 6. Possible mild bladder wall thickening. Correlate with symptoms to suggest cystitis or bladder outlet obstruction.   Electronically Signed   By: Marylyn Ishihara  Jobe Igo M.D.   On: 11/16/2014 10:56   Ct Abdomen Pelvis W Contrast  11/16/2014   CLINICAL DATA:  Lung cancer restaging. Left-sided chest pain for 1 week. Diagnosed in 2015. Chemotherapy and radiation therapy complete. Small cell type. Diabetes. Hypertension.  EXAM: CT CHEST, ABDOMEN, AND PELVIS WITH CONTRAST  TECHNIQUE: Multidetector CT imaging of the chest, abdomen and pelvis was performed following the standard protocol during bolus administration of intravenous contrast.  CONTRAST:  143mL OMNIPAQUE IOHEXOL 300 MG/ML  SOLN  COMPARISON:  09/09/2014  FINDINGS: CT CHEST FINDINGS  Mediastinum/Nodes: No supraclavicular adenopathy. Extensive aortic atherosclerosis. Normal heart size, without pericardial effusion. Multivessel coronary artery atherosclerosis. No central pulmonary embolism, on this non-dedicated study. No mediastinal adenopathy. Decrease in left hilar adenopathy. 1.4 x 1.4 cm node on image 31 is decreased from  2.5 x 2.2 cm on the prior. No right hilar adenopathy.  Lungs/Pleura: Trace left pleural fluid is new. Secretions along the trachea. Moderate centrilobular emphysema.  Left lower lobe spiculated pulmonary nodule measures 1.8 x 1.6 cm on image 37 and is felt to be similar to 2.2 x 1.4 cm on the prior.  Minimal nodularity along the left major fissure on image 34 is new. Nonspecific.  Musculoskeletal: Progressive osseous metastasis. Posterior vertebral body tumor at T4 and T10 encroach upon the ventral canal. Example sagittal image 61. Axial images 43 and 67.  CT ABDOMEN PELVIS FINDINGS  Hepatobiliary: Development of hepatic metastasis. Example right hepatic lobe 2.1 cm lesion on image 64.  Lateral segment left liver lobe 2.3 cm lesion on image 53. Resolved hepatic steatosis.  Cholelithiasis without acute cholecystitis or biliary ductal dilatation.  Pancreas: Hypo attenuating lesion in the pancreatic tail measures 11 mm on image 65 and may have increased minimally from the prior exam. Suspicious for a metastasis.  Spleen: Normal  Adrenals/Urinary Tract: Normal adrenal glands. Too small to characterize interpolar left renal lesion. Normal right kidney, without hydronephrosis. Possible bladder wall thickening, mild.  Stomach/Bowel: Normal stomach, without wall thickening. Normal colon, appendix, and terminal ileum. Normal small bowel.  Vascular/Lymphatic: Advanced aortic and branch vessel atherosclerosis. Developing retroperitoneal adenopathy, including a left periaortic 1.4 cm node on image 75. Likely necrotic portacaval node at 1.7 cm on image 66. No pelvic adenopathy.  Reproductive: Normal prostate.  Other: No significant free fluid. No evidence of omental or peritoneal disease. Tiny fat containing bilateral inguinal hernias.  Musculoskeletal: Widespread osseous metastasis. Progressive. Right iliac lesion is enlarged with cortical destruction/pathologic fracture. Ill-defined lytic metastasis throughout the sacrum is  progressive. Soft tissue component extends anterior to the left side of the sacrum including on image 103. Markedly progressive thoracolumbar vertebral metastatic disease. Tumor involvement involving the posterior aspect of L2 extends minimally into the ventral canal. Multiple compression deformities. Similar.  IMPRESSION: CT CHEST IMPRESSION  1. Similar left lower lobe pulmonary nodule with decrease in left hilar adenopathy. 2. Progressive osseous metastasis. Tumor canal encroachment involving the thoracic spine. Consider further evaluation with pre and post contrast thoracic spine MRI. 3.  Atherosclerosis, including within the coronary arteries. 4. New trace left pleural fluid.  CT ABDOMEN AND PELVIS IMPRESSION  1. Development of hepatic metastasis. 2. Developing nodal metastasis within the upper abdomen. 3. Progressive osseous metastasis. Lumbar vertebral canal encroachment would also be better evaluated with pre and post contrast lumbar spine MRI. 4. Hypo attenuating pancreatic lesion is suspicious for metastasis. Similar to minimally progressive. 5. Cholelithiasis. 6. Possible mild bladder wall thickening. Correlate with symptoms to suggest cystitis or bladder outlet obstruction.  Electronically Signed   By: Kerry Peck M.D.   On: 11/16/2014 10:56    ASSESSMENT AND PLAN: this is a very pleasant 54 years old white male recently diagnosed with extensive stage small cell lung cancer status post 6 cycles of systemic chemotherapy with carboplatin and etoposide with partial response. This was followed by prophylactic cranial irradiation in addition to palliative radiotherapy to the T7 thoracic spine and the chest. The recent CT scan of the Chest, Abdomen and pelvis showed evidence for further disease progression with progressive osseous metastasis and tumor canal encroachment involving the thoracic spine as well as development of hepatic metastasis and nodal metastasis within the upper abdomen. I had a  lengthy discussion with the patient today about his scan results and treatment options. The patient is scheduled to have MRI of the thoracic spine on 12/01/2014. I will request his MRI to be performed as soon as possible to rule out spinal cord compression. The patient was evaluated by Dr. Sofie Hartigan and he is expected to start palliative radiotherapy soon. I also started the patient on Decadron 4 mg by mouth every 6 hours. For the hypercalcemia of malignancy, I will start the patient on Zometa 4 mg IV today and I will also arrange for the patient to receive 1 L of normal saline today. The patient has evidence for disease progression in the liver and abdomen in addition to the osseous metastasis. I discussed with him proceeding with second line chemotherapy with cisplatin 30 MG/M2 and irinotecan 65 MG/M2 on days 1 and 8 every 3 weeks. He was also given him the option of palliative care and hospice. The patient is interested in proceeding with systemic chemotherapy and he is expected to start the first dose of this treatment early next week. I discussed with the patient adverse effect of the chemotherapy and he would like to proceed with the treatment as planned. He would come back for follow-up visit in 2 weeks for reevaluation and management of any adverse effect of his treatment. For pain management, the patient will continue on OxyContin and oxycodone for breakthrough pain. I discussed with him treatment with long acting fentanyl patch but the patient declined this option. The patient was advised to call immediately if he has any concerning symptoms in the interval. The patient voices understanding of current disease status and treatment options and is in agreement with the current care plan.  All questions were answered. The patient knows to call the clinic with any problems, questions or concerns. We can certainly see the patient much sooner if necessary.  Disclaimer: This note was dictated with voice  recognition software. Similar sounding words can inadvertently be transcribed and may not be corrected upon review.

## 2014-11-24 ENCOUNTER — Telehealth: Payer: Self-pay | Admitting: Oncology

## 2014-11-24 ENCOUNTER — Ambulatory Visit: Payer: Federal, State, Local not specified - PPO | Admitting: Radiation Oncology

## 2014-11-24 NOTE — Telephone Encounter (Signed)
Called in prescription for to [facility] per Dr. Sondra Come for Zofran 8 mg tablets.  Take 1 tablet (8mg ) by mouth q 8 hour as needed for nausea and vomiting.  Disp. 20 tablets. 1 refill.  Called [place] to let him know that it has been called in.

## 2014-11-25 ENCOUNTER — Ambulatory Visit
Admission: RE | Admit: 2014-11-25 | Discharge: 2014-11-25 | Disposition: A | Discharge status: Home / Self Care | Payer: Federal, State, Local not specified - PPO | Source: Ambulatory Visit | Attending: Radiation Oncology | Admitting: Radiation Oncology

## 2014-11-25 DIAGNOSIS — C3490 Malignant neoplasm of unspecified part of unspecified bronchus or lung: Secondary | ICD-10-CM | POA: Diagnosis present

## 2014-11-25 DIAGNOSIS — Z87891 Personal history of nicotine dependence: Secondary | ICD-10-CM | POA: Insufficient documentation

## 2014-11-25 DIAGNOSIS — C7951 Secondary malignant neoplasm of bone: Secondary | ICD-10-CM | POA: Diagnosis not present

## 2014-11-25 MED ORDER — HYDROMORPHONE HCL 4 MG/ML IJ SOLN
1.0000 mg | Freq: Once | INTRAMUSCULAR | Status: DC
Start: 2014-11-25 — End: 2014-11-25
  Filled 2014-11-25: qty 1

## 2014-11-25 MED ORDER — HYDROMORPHONE HCL 1 MG/ML IJ SOLN
1.0000 mg | Freq: Once | INTRAMUSCULAR | Status: AC
Start: 2014-11-25 — End: 2014-11-25
  Administered 2014-11-25: 1 mg via INTRAMUSCULAR
  Filled 2014-11-25: qty 1

## 2014-11-25 NOTE — Progress Notes (Signed)
Mr. Kerry Peck given 1 mg dilaudid IM in left ventro-gluteal region as ordered by Dr. Pablo Ledger for lower back pain at 13:40pm.  He graded his pain as a level 8 on a scale of 0-10 at 1346.  Sitting in wheelchair presently.  Simulation completed at at 2:21pm and he grades his pain as a level 5 at this time.  Assisted off the treatment table and is presently sitting in a wheelchair.  Accompanied by family.

## 2014-11-25 NOTE — Progress Notes (Signed)
I spoke with Kerry Peck today. After discussions with him and his sister, I determined that he has been out of oxycontin for about a week and the oxycodone he has been taking 2 tablets every 4 hours has not been controlling his pain. His siter requests something to help him sleep at night. He describes the pain as "all over" particularly in his back and hips and is worse at 2AM when he wakes up to go to the bathroom He had been confused and taking 2 of the 40 mg oxycontin tablets when his dose was increased and has therefore run out early. His insurance would not fill the oxycontin until today despite Dr. Sondra Come giving him a prescription on Tuesday for both the oxycontin and oxycodone. I have him 1 mg of Diluadid IM for sim today. He was writhing in the wheelchair and begging for something for pain.  I discussed with his sister that when he restarts his oxycontin tonight that his pain should get better. I did not want to give him a sleep aid AND have him restart his oxycontin tonight as this could lead to CNS depression and respiratory compromise.  I asked them both to keep track of what pain medication he was taking and share that when he comes to see Dr. Sondra Come and start treatment on Wednesday. He declined admission for pain control.

## 2014-11-26 ENCOUNTER — Inpatient Hospital Stay (HOSPITAL_COMMUNITY)
Admission: EM | Admit: 2014-11-26 | Discharge: 2014-11-29 | DRG: 542 | Disposition: A | Discharge status: Home Health | Payer: Federal, State, Local not specified - PPO | Attending: Internal Medicine | Admitting: Internal Medicine

## 2014-11-26 ENCOUNTER — Inpatient Hospital Stay (HOSPITAL_COMMUNITY): Payer: Federal, State, Local not specified - PPO

## 2014-11-26 ENCOUNTER — Encounter (HOSPITAL_COMMUNITY): Payer: Self-pay | Admitting: Emergency Medicine

## 2014-11-26 DIAGNOSIS — R52 Pain, unspecified: Secondary | ICD-10-CM | POA: Diagnosis present

## 2014-11-26 DIAGNOSIS — Z79891 Long term (current) use of opiate analgesic: Secondary | ICD-10-CM | POA: Diagnosis not present

## 2014-11-26 DIAGNOSIS — Z79899 Other long term (current) drug therapy: Secondary | ICD-10-CM | POA: Diagnosis not present

## 2014-11-26 DIAGNOSIS — R630 Anorexia: Secondary | ICD-10-CM | POA: Insufficient documentation

## 2014-11-26 DIAGNOSIS — Z515 Encounter for palliative care: Secondary | ICD-10-CM | POA: Diagnosis not present

## 2014-11-26 DIAGNOSIS — K5900 Constipation, unspecified: Secondary | ICD-10-CM | POA: Insufficient documentation

## 2014-11-26 DIAGNOSIS — G4700 Insomnia, unspecified: Secondary | ICD-10-CM | POA: Diagnosis present

## 2014-11-26 DIAGNOSIS — R339 Retention of urine, unspecified: Secondary | ICD-10-CM | POA: Diagnosis present

## 2014-11-26 DIAGNOSIS — C7951 Secondary malignant neoplasm of bone: Principal | ICD-10-CM | POA: Diagnosis present

## 2014-11-26 DIAGNOSIS — Z800 Family history of malignant neoplasm of digestive organs: Secondary | ICD-10-CM

## 2014-11-26 DIAGNOSIS — C3490 Malignant neoplasm of unspecified part of unspecified bronchus or lung: Secondary | ICD-10-CM | POA: Diagnosis present

## 2014-11-26 DIAGNOSIS — Z6825 Body mass index (BMI) 25.0-25.9, adult: Secondary | ICD-10-CM | POA: Diagnosis not present

## 2014-11-26 DIAGNOSIS — Z87891 Personal history of nicotine dependence: Secondary | ICD-10-CM

## 2014-11-26 DIAGNOSIS — Z7982 Long term (current) use of aspirin: Secondary | ICD-10-CM | POA: Diagnosis not present

## 2014-11-26 DIAGNOSIS — E46 Unspecified protein-calorie malnutrition: Secondary | ICD-10-CM | POA: Diagnosis present

## 2014-11-26 DIAGNOSIS — M545 Low back pain: Secondary | ICD-10-CM | POA: Diagnosis present

## 2014-11-26 DIAGNOSIS — E785 Hyperlipidemia, unspecified: Secondary | ICD-10-CM | POA: Diagnosis present

## 2014-11-26 DIAGNOSIS — I10 Essential (primary) hypertension: Secondary | ICD-10-CM | POA: Diagnosis present

## 2014-11-26 DIAGNOSIS — E43 Unspecified severe protein-calorie malnutrition: Secondary | ICD-10-CM | POA: Insufficient documentation

## 2014-11-26 DIAGNOSIS — E119 Type 2 diabetes mellitus without complications: Secondary | ICD-10-CM | POA: Diagnosis present

## 2014-11-26 DIAGNOSIS — Z85118 Personal history of other malignant neoplasm of bronchus and lung: Secondary | ICD-10-CM

## 2014-11-26 DIAGNOSIS — Z9221 Personal history of antineoplastic chemotherapy: Secondary | ICD-10-CM

## 2014-11-26 DIAGNOSIS — C801 Malignant (primary) neoplasm, unspecified: Secondary | ICD-10-CM

## 2014-11-26 DIAGNOSIS — G893 Neoplasm related pain (acute) (chronic): Secondary | ICD-10-CM | POA: Diagnosis present

## 2014-11-26 LAB — CBC WITH DIFFERENTIAL/PLATELET
BASOS ABS: 0 10*3/uL (ref 0.0–0.1)
BASOS PCT: 0 % (ref 0–1)
EOS ABS: 0.2 10*3/uL (ref 0.0–0.7)
EOS PCT: 2 % (ref 0–5)
HCT: 31 % — ABNORMAL LOW (ref 39.0–52.0)
HEMOGLOBIN: 10.3 g/dL — AB (ref 13.0–17.0)
LYMPHS ABS: 0.8 10*3/uL (ref 0.7–4.0)
Lymphocytes Relative: 8 % — ABNORMAL LOW (ref 12–46)
MCH: 31.2 pg (ref 26.0–34.0)
MCHC: 33.2 g/dL (ref 30.0–36.0)
MCV: 93.9 fL (ref 78.0–100.0)
Monocytes Absolute: 1 10*3/uL (ref 0.1–1.0)
Monocytes Relative: 10 % (ref 3–12)
NEUTROS ABS: 7.6 10*3/uL (ref 1.7–7.7)
Neutrophils Relative %: 80 % — ABNORMAL HIGH (ref 43–77)
PLATELETS: 187 10*3/uL (ref 150–400)
RBC: 3.3 MIL/uL — AB (ref 4.22–5.81)
RDW: 14.6 % (ref 11.5–15.5)
WBC: 9.5 10*3/uL (ref 4.0–10.5)

## 2014-11-26 LAB — URINALYSIS, ROUTINE W REFLEX MICROSCOPIC
Bilirubin Urine: NEGATIVE
Glucose, UA: NEGATIVE mg/dL
HGB URINE DIPSTICK: NEGATIVE
KETONES UR: NEGATIVE mg/dL
Leukocytes, UA: NEGATIVE
Nitrite: NEGATIVE
PROTEIN: NEGATIVE mg/dL
Specific Gravity, Urine: 1.021 (ref 1.005–1.030)
Urobilinogen, UA: 0.2 mg/dL (ref 0.0–1.0)
pH: 6 (ref 5.0–8.0)

## 2014-11-26 LAB — I-STAT CHEM 8, ED
BUN: 20 mg/dL (ref 6–23)
CALCIUM ION: 1.3 mmol/L — AB (ref 1.12–1.23)
Chloride: 104 mmol/L (ref 96–112)
Creatinine, Ser: 0.7 mg/dL (ref 0.50–1.35)
Glucose, Bld: 179 mg/dL — ABNORMAL HIGH (ref 70–99)
HEMATOCRIT: 31 % — AB (ref 39.0–52.0)
Hemoglobin: 10.5 g/dL — ABNORMAL LOW (ref 13.0–17.0)
POTASSIUM: 3.3 mmol/L — AB (ref 3.5–5.1)
SODIUM: 139 mmol/L (ref 135–145)
TCO2: 21 mmol/L (ref 0–100)

## 2014-11-26 LAB — CBG MONITORING, ED: Glucose-Capillary: 154 mg/dL — ABNORMAL HIGH (ref 70–99)

## 2014-11-26 LAB — GLUCOSE, CAPILLARY
GLUCOSE-CAPILLARY: 174 mg/dL — AB (ref 70–99)
GLUCOSE-CAPILLARY: 261 mg/dL — AB (ref 70–99)
Glucose-Capillary: 246 mg/dL — ABNORMAL HIGH (ref 70–99)

## 2014-11-26 MED ORDER — ASPIRIN EC 81 MG PO TBEC
81.0000 mg | DELAYED_RELEASE_TABLET | ORAL | Status: DC
  Administered 2014-11-26 – 2014-11-29 (×4): 81 mg via ORAL
  Filled 2014-11-26 (×4): qty 1

## 2014-11-26 MED ORDER — HYDROMORPHONE HCL 1 MG/ML IJ SOLN: 1.0000 mg | INTRAMUSCULAR | Status: DC | PRN

## 2014-11-26 MED ORDER — CETYLPYRIDINIUM CHLORIDE 0.05 % MT LIQD
7.0000 mL | Freq: Two times a day (BID) | OROMUCOSAL | Status: DC
  Administered 2014-11-26 – 2014-11-29 (×6): 7 mL via OROMUCOSAL

## 2014-11-26 MED ORDER — CALCIUM CARBONATE-VITAMIN D 500-200 MG-UNIT PO TABS
1.0000 | ORAL_TABLET | Freq: Every day | ORAL | Status: DC
  Administered 2014-11-27 – 2014-11-29 (×3): 1 via ORAL
  Filled 2014-11-26 (×5): qty 1

## 2014-11-26 MED ORDER — BISACODYL 10 MG RE SUPP: 10.0000 mg | Freq: Every day | RECTAL | Status: DC | PRN

## 2014-11-26 MED ORDER — DEXAMETHASONE 4 MG PO TABS
4.0000 mg | ORAL_TABLET | Freq: Four times a day (QID) | ORAL | Status: DC
  Administered 2014-11-26 – 2014-11-29 (×14): 4 mg via ORAL
  Filled 2014-11-26 (×16): qty 1

## 2014-11-26 MED ORDER — MIRTAZAPINE 15 MG PO TABS
15.0000 mg | ORAL_TABLET | Freq: Every day | ORAL | Status: DC
  Administered 2014-11-26 – 2014-11-28 (×3): 15 mg via ORAL
  Filled 2014-11-26 (×4): qty 1

## 2014-11-26 MED ORDER — IPRATROPIUM BROMIDE 0.02 % IN SOLN
0.5000 mg | Freq: Once | RESPIRATORY_TRACT | Status: AC
  Administered 2014-11-26: 0.5 mg via RESPIRATORY_TRACT
  Filled 2014-11-26: qty 2.5

## 2014-11-26 MED ORDER — HYDROMORPHONE HCL 2 MG/ML IJ SOLN
2.0000 mg | INTRAMUSCULAR | Status: AC
Start: 2014-11-26 — End: 2014-11-26
  Administered 2014-11-26: 2 mg via INTRAVENOUS
  Filled 2014-11-26: qty 1

## 2014-11-26 MED ORDER — DEXAMETHASONE SODIUM PHOSPHATE 10 MG/ML IJ SOLN
10.0000 mg | Freq: Once | INTRAMUSCULAR | Status: AC
  Administered 2014-11-26: 10 mg via INTRAVENOUS
  Filled 2014-11-26: qty 2.5

## 2014-11-26 MED ORDER — OXYCODONE HCL 5 MG PO TABS
5.0000 mg | ORAL_TABLET | ORAL | Status: DC | PRN
  Administered 2014-11-26: 5 mg via ORAL
  Filled 2014-11-26: qty 1

## 2014-11-26 MED ORDER — ONDANSETRON HCL 4 MG PO TABS: 4.0000 mg | ORAL_TABLET | Freq: Four times a day (QID) | ORAL | Status: DC | PRN

## 2014-11-26 MED ORDER — DOCUSATE SODIUM 100 MG PO CAPS
100.0000 mg | ORAL_CAPSULE | Freq: Two times a day (BID) | ORAL | Status: DC
  Administered 2014-11-26 – 2014-11-29 (×7): 100 mg via ORAL
  Filled 2014-11-26 (×8): qty 1

## 2014-11-26 MED ORDER — ALBUTEROL SULFATE (2.5 MG/3ML) 0.083% IN NEBU: 2.5000 mg | INHALATION_SOLUTION | RESPIRATORY_TRACT | Status: DC | PRN

## 2014-11-26 MED ORDER — SENNA 8.6 MG PO TABS
1.0000 | ORAL_TABLET | Freq: Two times a day (BID) | ORAL | Status: DC
  Administered 2014-11-26 – 2014-11-29 (×7): 8.6 mg via ORAL
  Filled 2014-11-26 (×7): qty 1

## 2014-11-26 MED ORDER — HEPARIN SODIUM (PORCINE) 5000 UNIT/ML IJ SOLN
5000.0000 [IU] | Freq: Three times a day (TID) | INTRAMUSCULAR | Status: DC
  Administered 2014-11-26 – 2014-11-29 (×11): 5000 [IU] via SUBCUTANEOUS
  Filled 2014-11-26 (×13): qty 1

## 2014-11-26 MED ORDER — SUCRALFATE 1 GM/10ML PO SUSP
1.0000 g | Freq: Three times a day (TID) | ORAL | Status: DC
  Administered 2014-11-26 – 2014-11-29 (×12): 1 g via ORAL
  Filled 2014-11-26 (×16): qty 10

## 2014-11-26 MED ORDER — SODIUM CHLORIDE 0.9 % IV BOLUS (SEPSIS)
1000.0000 mL | Freq: Once | INTRAVENOUS | Status: AC
  Administered 2014-11-26: 1000 mL via INTRAVENOUS

## 2014-11-26 MED ORDER — ACETAMINOPHEN 650 MG RE SUPP: 650.0000 mg | Freq: Four times a day (QID) | RECTAL | Status: DC | PRN

## 2014-11-26 MED ORDER — OXYCODONE HCL ER 40 MG PO T12A
40.0000 mg | EXTENDED_RELEASE_TABLET | Freq: Two times a day (BID) | ORAL | Status: DC
  Administered 2014-11-26: 40 mg via ORAL
  Filled 2014-11-26: qty 1

## 2014-11-26 MED ORDER — OXYCODONE HCL ER 40 MG PO T12A
60.0000 mg | EXTENDED_RELEASE_TABLET | Freq: Two times a day (BID) | ORAL | Status: DC
  Administered 2014-11-26 – 2014-11-29 (×6): 60 mg via ORAL
  Filled 2014-11-26 (×12): qty 1

## 2014-11-26 MED ORDER — OXYCODONE HCL 5 MG PO TABS
10.0000 mg | ORAL_TABLET | ORAL | Status: DC | PRN
  Administered 2014-11-26: 15 mg via ORAL
  Administered 2014-11-26 – 2014-11-27 (×2): 10 mg via ORAL
  Administered 2014-11-27 – 2014-11-29 (×9): 15 mg via ORAL
  Filled 2014-11-26 (×5): qty 3
  Filled 2014-11-26: qty 2
  Filled 2014-11-26 (×2): qty 3
  Filled 2014-11-26: qty 2
  Filled 2014-11-26 (×3): qty 3

## 2014-11-26 MED ORDER — METOPROLOL TARTRATE 25 MG PO TABS
25.0000 mg | ORAL_TABLET | Freq: Every day | ORAL | Status: DC
  Administered 2014-11-26 – 2014-11-29 (×4): 25 mg via ORAL
  Filled 2014-11-26 (×4): qty 1

## 2014-11-26 MED ORDER — PANTOPRAZOLE SODIUM 40 MG PO TBEC
40.0000 mg | DELAYED_RELEASE_TABLET | Freq: Every day | ORAL | Status: DC
  Administered 2014-11-26 – 2014-11-29 (×4): 40 mg via ORAL
  Filled 2014-11-26 (×4): qty 1

## 2014-11-26 MED ORDER — POLYETHYLENE GLYCOL 3350 17 G PO PACK
17.0000 g | PACK | Freq: Every day | ORAL | Status: DC | PRN
Start: 2014-11-26 — End: 2014-11-29

## 2014-11-26 MED ORDER — HYDROMORPHONE HCL 1 MG/ML IJ SOLN
1.0000 mg | Freq: Once | INTRAMUSCULAR | Status: AC
  Administered 2014-11-26: 1 mg via INTRAVENOUS
  Filled 2014-11-26: qty 1

## 2014-11-26 MED ORDER — MAGNESIUM CITRATE PO SOLN: 1.0000 | Freq: Once | ORAL | Status: AC | PRN

## 2014-11-26 MED ORDER — MOMETASONE FURO-FORMOTEROL FUM 100-5 MCG/ACT IN AERO
2.0000 | INHALATION_SPRAY | Freq: Two times a day (BID) | RESPIRATORY_TRACT | Status: DC
  Administered 2014-11-26 – 2014-11-29 (×8): 2 via RESPIRATORY_TRACT
  Filled 2014-11-26: qty 8.8

## 2014-11-26 MED ORDER — FENOFIBRATE 160 MG PO TABS
160.0000 mg | ORAL_TABLET | Freq: Every day | ORAL | Status: DC
  Administered 2014-11-26 – 2014-11-29 (×4): 160 mg via ORAL
  Filled 2014-11-26 (×4): qty 1

## 2014-11-26 MED ORDER — HYDROMORPHONE HCL 1 MG/ML IJ SOLN
1.0000 mg | INTRAMUSCULAR | Status: DC | PRN
  Administered 2014-11-26 (×2): 1 mg via INTRAVENOUS
  Filled 2014-11-26 (×2): qty 1

## 2014-11-26 MED ORDER — ZOLPIDEM TARTRATE 5 MG PO TABS
5.0000 mg | ORAL_TABLET | Freq: Once | ORAL | Status: AC
  Administered 2014-11-26: 5 mg via ORAL
  Filled 2014-11-26: qty 1

## 2014-11-26 MED ORDER — SODIUM CHLORIDE 0.9 % IV SOLN
INTRAVENOUS | Status: DC
  Administered 2014-11-26 – 2014-11-29 (×4): via INTRAVENOUS

## 2014-11-26 MED ORDER — GADOBENATE DIMEGLUMINE 529 MG/ML IV SOLN
20.0000 mL | Freq: Once | INTRAVENOUS | Status: AC | PRN
  Administered 2014-11-26: 20 mL via INTRAVENOUS

## 2014-11-26 MED ORDER — INSULIN ASPART 100 UNIT/ML ~~LOC~~ SOLN
0.0000 [IU] | Freq: Three times a day (TID) | SUBCUTANEOUS | Status: DC
  Administered 2014-11-26: 8 [IU] via SUBCUTANEOUS
  Administered 2014-11-26: 3 [IU] via SUBCUTANEOUS
  Administered 2014-11-27: 5 [IU] via SUBCUTANEOUS
  Administered 2014-11-27: 8 [IU] via SUBCUTANEOUS
  Administered 2014-11-28 (×2): 5 [IU] via SUBCUTANEOUS
  Administered 2014-11-28: 11 [IU] via SUBCUTANEOUS
  Administered 2014-11-29: 8 [IU] via SUBCUTANEOUS
  Administered 2014-11-29: 3 [IU] via SUBCUTANEOUS
  Administered 2014-11-29: 8 [IU] via SUBCUTANEOUS

## 2014-11-26 MED ORDER — VITAMIN C 500 MG PO TABS
500.0000 mg | ORAL_TABLET | Freq: Every day | ORAL | Status: DC
  Administered 2014-11-26 – 2014-11-29 (×4): 500 mg via ORAL
  Filled 2014-11-26 (×4): qty 1

## 2014-11-26 MED ORDER — HYDROMORPHONE HCL 1 MG/ML IJ SOLN
1.0000 mg | INTRAMUSCULAR | Status: DC | PRN
  Administered 2014-11-26 (×3): 1 mg via INTRAVENOUS
  Filled 2014-11-26 (×3): qty 1

## 2014-11-26 MED ORDER — ONDANSETRON HCL 4 MG/2ML IJ SOLN
4.0000 mg | Freq: Four times a day (QID) | INTRAMUSCULAR | Status: DC | PRN
  Administered 2014-11-27: 4 mg via INTRAVENOUS
  Filled 2014-11-26: qty 2

## 2014-11-26 MED ORDER — HYDROMORPHONE HCL 1 MG/ML IJ SOLN
1.0000 mg | INTRAMUSCULAR | Status: DC | PRN
  Administered 2014-11-26: 1 mg via INTRAVENOUS
  Filled 2014-11-26: qty 1

## 2014-11-26 MED ORDER — HYDROMORPHONE HCL 1 MG/ML IJ SOLN
1.0000 mg | INTRAMUSCULAR | Status: DC | PRN
  Administered 2014-11-27 – 2014-11-29 (×5): 1 mg via INTRAVENOUS
  Filled 2014-11-26 (×5): qty 1

## 2014-11-26 MED ORDER — FENTANYL CITRATE 0.05 MG/ML IJ SOLN
50.0000 ug | Freq: Once | INTRAMUSCULAR | Status: AC
  Administered 2014-11-26: 50 ug via NASAL
  Filled 2014-11-26: qty 2

## 2014-11-26 MED ORDER — ALBUTEROL SULFATE (2.5 MG/3ML) 0.083% IN NEBU
5.0000 mg | INHALATION_SOLUTION | Freq: Once | RESPIRATORY_TRACT | Status: AC
  Administered 2014-11-26: 5 mg via RESPIRATORY_TRACT
  Filled 2014-11-26: qty 6

## 2014-11-26 MED ORDER — VITAMIN B-6 100 MG PO TABS
100.0000 mg | ORAL_TABLET | Freq: Every day | ORAL | Status: DC
  Administered 2014-11-26 – 2014-11-29 (×4): 100 mg via ORAL
  Filled 2014-11-26 (×4): qty 1

## 2014-11-26 MED ORDER — AMLODIPINE BESYLATE 10 MG PO TABS
10.0000 mg | ORAL_TABLET | Freq: Every day | ORAL | Status: DC
  Administered 2014-11-26 – 2014-11-29 (×4): 10 mg via ORAL
  Filled 2014-11-26 (×4): qty 1

## 2014-11-26 MED ORDER — PROCHLORPERAZINE MALEATE 10 MG PO TABS: 10.0000 mg | ORAL_TABLET | Freq: Four times a day (QID) | ORAL | Status: DC | PRN

## 2014-11-26 MED ORDER — ATORVASTATIN CALCIUM 40 MG PO TABS
40.0000 mg | ORAL_TABLET | Freq: Every day | ORAL | Status: DC
  Administered 2014-11-26 – 2014-11-29 (×4): 40 mg via ORAL
  Filled 2014-11-26 (×4): qty 1

## 2014-11-26 MED ORDER — ACETAMINOPHEN 325 MG PO TABS: 650.0000 mg | ORAL_TABLET | Freq: Four times a day (QID) | ORAL | Status: DC | PRN

## 2014-11-26 NOTE — Progress Notes (Signed)
Patient states he feels like his left arm is numb. Patient is ble to distinguish when I touch his hand, forearm, and shoulder. He can distinguish how many fingers I touch his wrist with. Patient still has a strong hand grip.

## 2014-11-26 NOTE — ED Provider Notes (Signed)
CSN: [id number]     Arrival date & time 11/26/14  0510 History   First MD Initiated Contact with Patient 11/26/14 [phone]     Chief Complaint  Patient presents with  . Back Pain     (Consider location/radiation/quality/duration/timing/severity/associated sxs/prior Treatment) HPI   54 year old male with history of extensive stage IV small cell lung cancer status post chemotherapy presenting for evaluation of worsening back pain. Patient reported low back pain secondary to cancer. He had a recent CT scan of chest abdomen pelvis which show evidence of disease progression with progressive osseous metastatic and tumor canal encroachment. He recently ran out of his pain medication at home for a week and now his pain is not well controlled. Complain of severe sharp pain to low back, nonradiating, persistent, nothing specific to better or worse. Pain is currently 10 out of 10. Pain intensified yesterday and he was trying to get out of bed. He was subsequently seen by his radiation oncologist for an appointment. At that time he was getting an IM Dilaudid and was offered admission for pain control however patient declined. He was receiving refills of his oxycodone and OxyContin but despite taking these medication and provide minimal relief. He is here for pain control. He denies any fever, productive cough, dysuria, bowel bladder incontinence, or saddle anesthesia. He is scheduled for palliative radiation therapy next week. He reported having decreased appetite and feeling dehydrated.     Past Medical History  Diagnosis Date  . Diabetes mellitus without complication   . Hypertension   . Smoking addiction   . Cancer     lung and liver  . Radiation 08/16/14-09/06/14    prophylatic cranial radiation 25 gray, chest, t-spine  35 gray  . Radiation 09/29/14-10/12/14    left hilar area 30 gray, right pelvis 30 gray, right lower rib cage area 30 gray  . 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  No pertinent past surgical history. Family History  Problem Relation Age of Onset  . Pancreatic cancer Mother    History  Substance Use Topics  . Smoking status: Former Smoker -- 0.75 packs/day for 30 years    Types: Cigarettes    Quit date: 06/03/2014  . Smokeless tobacco: Never Used  . Alcohol Use: 18.0 oz/week    30 Cans of beer per week     Comment: 6 beers per day x 5 days per week    Review of Systems  All other systems reviewed and are negative.     Allergies  Review of patient's allergies indicates no known allergies.  Home Medications   Prior to Admission medications   Medication Sig Start Date End Date Taking? Authorizing Provider  albuterol (PROVENTIL HFA;VENTOLIN HFA) 108 (90 BASE) MCG/ACT inhaler Inhale 2 puffs into the lungs every 6 (six) hours as needed for wheezing. 08/09/12  Yes Leandrew Koyanagi, MD  amLODipine (NORVASC) 10 MG tablet Take 10 mg by mouth daily.   Yes Historical Provider, MD  aspirin EC 81 MG tablet Take 81 mg by mouth as directed. Take 1 tablet all days except Wednesday   Yes Historical Provider, MD  atorvastatin (LIPITOR) 40 MG tablet Take 40 mg by mouth daily.   Yes Historical Provider, MD  calcium-vitamin D (OSCAL WITH D) 500-200 MG-UNIT per tablet Take 1 tablet by mouth daily with breakfast.    Yes Historical Provider, MD  dexamethasone (DECADRON) 4 MG tablet Take 1 tablet (4 mg total) by mouth 4 (four) times daily. 11/23/14  Yes Curt Bears, MD  fenofibrate 160 MG tablet Take 160 mg by mouth daily.   Yes Historical Provider, MD  Fluticasone-Salmeterol (ADVAIR) 250-50 MCG/DOSE AEPB Inhale 1 puff into the lungs every 12 (twelve) hours.   Yes Historical Provider, MD  glipiZIDE (GLUCOTROL XL) 5 MG 24 hr tablet Take 5 mg by mouth daily with breakfast.  11/09/14  Yes Historical Provider, MD  metFORMIN (GLUCOPHAGE) 500 MG tablet Take 1,000 mg by mouth 2 (two) times daily with a meal.    Yes Historical Provider, MD  metoprolol tartrate  (LOPRESSOR) 25 MG tablet Take 25 mg by mouth daily.    Yes Historical Provider, MD  Multiple Vitamins-Minerals (CENTRUM SILVER ADULT 50+ PO) Take 1 tablet by mouth daily.    Yes Historical Provider, MD  naproxen sodium (ANAPROX) 220 MG tablet Take 220 mg by mouth 2 (two) times daily as needed (pain). Takes 2 tablets bid   Yes Historical Provider, MD  OxyCODONE (OXYCONTIN) 40 mg T12A 12 hr tablet Take 1 tablet (40 mg total) by mouth every 12 (twelve) hours. 11/21/14  Yes Gery Pray, MD  oxyCODONE-acetaminophen (PERCOCET) 10-325 MG per tablet Take 1 tablet by mouth every 4 (four) hours as needed for pain. 11/21/14  Yes Gery Pray, MD  pantoprazole (PROTONIX) 40 MG tablet Take 40 mg by mouth daily.  05/19/14  Yes Historical Provider, MD  prochlorperazine (COMPAZINE) 10 MG tablet Take 1 tablet (10 mg total) by mouth every 6 (six) hours as needed for nausea or vomiting. 03/03/14  Yes Curt Bears, MD  pyridOXINE (VITAMIN B-6) 100 MG tablet Take 100 mg by mouth daily.   Yes Historical Provider, MD  sucralfate (CARAFATE) 1 GM/10ML suspension Take 10 mLs (1 g total) by mouth 4 (four) times daily -  with meals and at bedtime. Patient taking differently: Take 1 g by mouth 4 (four) times daily -  with meals and at bedtime. . 09/15/14  Yes Gery Pray, MD  vitamin C (ASCORBIC ACID) 500 MG tablet Take 500 mg by mouth daily.   Yes Historical Provider, MD  aspirin 325 MG tablet Take 325 mg by mouth every Wednesday.    Historical Provider, MD  Blood Pressure Monitoring (5 SERIES BP MONITOR) DEVI  06/08/14   Historical Provider, MD  emollient (BIAFINE) cream Apply topically as needed. Patient not taking: Reported on 11/21/2014 08/16/14   Gery Pray, MD  esomeprazole (NEXIUM) 20 MG capsule Take 20 mg by mouth daily as needed.    Historical Provider, MD  ondansetron (ZOFRAN) 8 MG tablet Take 8 mg by mouth every 8 (eight) hours as needed for nausea or vomiting. Disp. 20 tablets. 1 refill.  Called in to [facility] per  Dr. Sondra Come on 11/24/14.    Historical Provider, MD  ONE TOUCH ULTRA TEST test strip  04/11/14   Historical Provider, MD   BP 141/74 mmHg  Pulse 111  Temp(Src) 98.2 F (36.8 C) (Oral)  Resp 24  SpO2 96% Physical Exam  Constitutional: He appears well-developed and well-nourished. No distress.  Pleasant Caucasian male laying in bed, appears to be in moderate discomfort.  HENT:  Head: Atraumatic.  Oral mucosa dry  Eyes: Conjunctivae are normal.  Neck: Normal range of motion. Neck supple.  Cardiovascular:  Tachycardic without murmurs rubs or gallops  Pulmonary/Chest:  Shallow breath sounds, no obvious rales or rhonchi  Abdominal: Soft. He exhibits no distension. There is no tenderness.  Decreased bowel sounds  Musculoskeletal: He exhibits tenderness (tenderness to lumbar midline spine, no crepitus,  no step-off, no overlying skin changes. Increasing pain with back flexion extension rotation.).  5/5 strength to bilateral lower extremities. Intact distal pulses.  Neurological: He is alert.  Patellar deep tendon reflex intact bilaterally, no foot drops.  Skin: No rash noted.  Psychiatric: He has a normal mood and affect.  Nursing note and vitals reviewed.   ED Course  Procedures (including critical care time)  Acute on chronic low back pain secondary to metastatic small cell's cancer with bone involvement. Patient recently had MRI of his back pain, was seen by his radiation oncologist specialist yesterday for further management. He does not have any red flags at this time to suggest cauda equina. He may need medical admission for pain management. Will provide pain control at this time.  6:45 AM Pt report chest tightness. Does have a faint wheeze on exam.  Albuterol/atrovent nebs given.   7:45 AM Worsening pain despite receiving 2mg  of Dilaudid.  Will need admission for pain control.    8:26 AM Appreciated consult from [facility] Dr. Roseanne Kaufman who agrees to see and admit pt for  further care.    Labs Review Labs Reviewed  CBC WITH DIFFERENTIAL/PLATELET - Abnormal; Notable for the following:    RBC 3.30 (*)    Hemoglobin 10.3 (*)    HCT 31.0 (*)    Neutrophils Relative % 80 (*)    Lymphocytes Relative 8 (*)    All other components within normal limits  I-STAT CHEM 8, ED - Abnormal; Notable for the following:    Potassium 3.3 (*)    Glucose, Bld 179 (*)    Calcium, Ion 1.30 (*)    Hemoglobin 10.5 (*)    HCT 31.0 (*)    All other components within normal limits  CBG MONITORING, ED - Abnormal; Notable for the following:    Glucose-Capillary 154 (*)    All other components within normal limits  URINALYSIS, ROUTINE W REFLEX MICROSCOPIC    Imaging Review No results found.   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 I reviewed available ER/hospitalization records thought the EMR     Domenic Moras, PA-C 11/26/14 0827  Jola Schmidt, MD 11/26/14 2302

## 2014-11-26 NOTE — Progress Notes (Addendum)
Patient  had urinary retention , repeat MRI thoracic spine showing no changes from previous study 3 days ago, but giving patient urinary retention there could be potential of some cord compressive symptoms based on the disease of T1-2 and T9, discussed this finding with Dr. Camelia Eng from radiation oncology , will plan for radiation therapy later today or tomorrow morning. As well plan discussed with oncology on call Dr. Jana Hakim, , retention as well may be secondary to pain medication , inserted foley catheter. Phillips Climes MD

## 2014-11-26 NOTE — ED Notes (Signed)
Bed: [facility]ALD Expected date:  Expected time:  Means of arrival:  Comments: 

## 2014-11-26 NOTE — ED Notes (Signed)
Patient given additional pain medication due to continued uncontrolled pain, see MAR Patient placed on 2L [place] for comfort EDP present at bedside

## 2014-11-26 NOTE — ED Notes (Signed)
Pt receiving breathing treatment upon this RN's shift assessment. This RN rounded and introduced self with night shift RN. PT rates his pain 6/10. He appears pale but resting somewhat comfortably. Lungs inspiratory wheezing bilaterally. Pt denies further needs at this time.

## 2014-11-26 NOTE — ED Notes (Signed)
Pt transported from home with c/o back pain, pt has known bone mets, pt seen at MD yesterday, given IM Dilaudid "that didn't hardly help" pt states he had taken Oxycontin 40mg  PO @ 2200 and Zofran earlier in the day. Pt grunting with speaking, rates pain 10/10

## 2014-11-26 NOTE — ED Notes (Signed)
PA Gertie Fey made aware of patient's pain status, diaphoresis, tachycardia (120 sinus Tach). PA Bowie plans to admit for pain control. Will administer another dose of dilaudid at 0800.

## 2014-11-26 NOTE — ED Notes (Signed)
Pt reports pain is better rates 2/10. Pain worse with movement

## 2014-11-26 NOTE — ED Notes (Addendum)
Patient medicated per ED pain protocol, see MAR Patient with c/o chronic back pain due to CA Patient reports that he restarted his pain medications yesterday after running out of medications and being without meds for one week Patient states that he was offered Fentanyl patch for pain, but refused because he was told that "Oxycontin was the strongest thing out there." Patient in obvious discomfort  Mouth swabs provided to patient

## 2014-11-26 NOTE — H&P (Signed)
Patient Demographics  Kerry Peck, is a 54 y.o. male  MRN: [id number]   DOB - 01-06-1961  Admit Date - 11/26/2014  Outpatient Primary MD for the patient is Lujean Amel, MD   With History of -  Past Medical History  Diagnosis Date  . Diabetes mellitus without complication   . Hypertension   . Smoking addiction   . Cancer     lung and liver  . Radiation 08/16/14-09/06/14    prophylatic cranial radiation 25 gray, chest, t-spine  35 gray  . Radiation 09/29/14-10/12/14    left hilar area 30 gray, right pelvis 30 gray, right lower rib cage area 30 gray  . 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 No pertinent past surgical history.  in for   Chief Complaint  Patient presents with  . Back Pain     HPI  Kerry Peck  is a 54 y.o. male, with stage IV small cell lung cancer diagnosed in July 2015, patient with thoracic spine metastasis, already been seen by radiation oncology, already had simulation done, plan to start on palliative radiation this coming week, presents to [facility] ED with uncontrolled pain, patient out of his MS Contin for 1 week,  given prescription yesterday by Dr. Pablo Ledger  from radiation oncology as patient declined to be admitted for pain control, but he could not tolerate the pain anymore at home, so he presented to ED, there are no new neurological deficits, no urinary or stool incontinence, no loss of sensation, patient received IV Dilaudid in ED with minimal relief of pain, so hospitalist requested to admit the patient.    Review of Systems    In addition to the HPI above,  No Fever-chills, No Headache, No changes with Vision or hearing, No problems swallowing food or Liquids, No Chest pain, Cough or Shortness of Breath, No Abdominal pain, No Nausea or Vommitting, Bowel movements are regular, No Blood in stool or Urine, No dysuria, No new skin rashes or bruises, Complains of severe pain and back area No new weakness,  tingling, numbness in any extremity, No recent weight gain or loss, No polyuria, polydypsia or polyphagia,   A full 10 point Review of Systems was done, except as stated above, all other Review of Systems were negative.   Social History History  Substance Use Topics  . Smoking status: Former Smoker -- 0.75 packs/day for 30 years    Types: Cigarettes    Quit date: 06/03/2014  . Smokeless tobacco: Never Used  . Alcohol Use: 18.0 oz/week    30 Cans of beer per week     Comment: 6 beers per day x 5 days per week     Family History Family History  Problem Relation Age of Onset  . Pancreatic cancer Mother      Prior to Admission medications   Medication Sig Start Date End Date Taking? Authorizing Provider  albuterol (PROVENTIL HFA;VENTOLIN HFA) 108 (90 BASE) MCG/ACT inhaler Inhale 2 puffs into the lungs every 6 (six) hours as needed for wheezing. 08/09/12  Yes Leandrew Koyanagi, MD  amLODipine (NORVASC) 10 MG tablet Take 10 mg by mouth daily.   Yes Historical Provider, MD  aspirin EC 81 MG tablet Take 81 mg by mouth as directed. Take 1 tablet all days except Wednesday   Yes Historical Provider, MD  atorvastatin (LIPITOR) 40 MG tablet Take 40 mg by mouth daily.   Yes Historical Provider, MD  calcium-vitamin D Darron Doom  WITH D) 500-200 MG-UNIT per tablet Take 1 tablet by mouth daily with breakfast.    Yes Historical Provider, MD  dexamethasone (DECADRON) 4 MG tablet Take 1 tablet (4 mg total) by mouth 4 (four) times daily. 11/23/14  Yes Curt Bears, MD  fenofibrate 160 MG tablet Take 160 mg by mouth daily.   Yes Historical Provider, MD  Fluticasone-Salmeterol (ADVAIR) 250-50 MCG/DOSE AEPB Inhale 1 puff into the lungs every 12 (twelve) hours.   Yes Historical Provider, MD  glipiZIDE (GLUCOTROL XL) 5 MG 24 hr tablet Take 5 mg by mouth daily with breakfast.  11/09/14  Yes Historical Provider, MD  metFORMIN (GLUCOPHAGE) 500 MG tablet Take 1,000 mg by mouth 2 (two) times daily with a meal.     Yes Historical Provider, MD  metoprolol tartrate (LOPRESSOR) 25 MG tablet Take 25 mg by mouth daily.    Yes Historical Provider, MD  Multiple Vitamins-Minerals (CENTRUM SILVER ADULT 50+ PO) Take 1 tablet by mouth daily.    Yes Historical Provider, MD  naproxen sodium (ANAPROX) 220 MG tablet Take 220 mg by mouth 2 (two) times daily as needed (pain). Takes 2 tablets bid   Yes Historical Provider, MD  OxyCODONE (OXYCONTIN) 40 mg T12A 12 hr tablet Take 1 tablet (40 mg total) by mouth every 12 (twelve) hours. 11/21/14  Yes Gery Pray, MD  oxyCODONE-acetaminophen (PERCOCET) 10-325 MG per tablet Take 1 tablet by mouth every 4 (four) hours as needed for pain. 11/21/14  Yes Gery Pray, MD  pantoprazole (PROTONIX) 40 MG tablet Take 40 mg by mouth daily.  05/19/14  Yes Historical Provider, MD  prochlorperazine (COMPAZINE) 10 MG tablet Take 1 tablet (10 mg total) by mouth every 6 (six) hours as needed for nausea or vomiting. 03/03/14  Yes Curt Bears, MD  pyridOXINE (VITAMIN B-6) 100 MG tablet Take 100 mg by mouth daily.   Yes Historical Provider, MD  sucralfate (CARAFATE) 1 GM/10ML suspension Take 10 mLs (1 g total) by mouth 4 (four) times daily -  with meals and at bedtime. Patient taking differently: Take 1 g by mouth 4 (four) times daily -  with meals and at bedtime. . 09/15/14  Yes Gery Pray, MD  vitamin C (ASCORBIC ACID) 500 MG tablet Take 500 mg by mouth daily.   Yes Historical Provider, MD  aspirin 325 MG tablet Take 325 mg by mouth every Wednesday.    Historical Provider, MD  Blood Pressure Monitoring (5 SERIES BP MONITOR) DEVI  06/08/14   Historical Provider, MD  emollient (BIAFINE) cream Apply topically as needed. Patient not taking: Reported on 11/21/2014 08/16/14   Gery Pray, MD  esomeprazole (NEXIUM) 20 MG capsule Take 20 mg by mouth daily as needed.    Historical Provider, MD  ondansetron (ZOFRAN) 8 MG tablet Take 8 mg by mouth every 8 (eight) hours as needed for nausea or vomiting.  Disp. 20 tablets. 1 refill.  Called in to [facility] per Dr. Sondra Come on 11/24/14.    Historical Provider, MD  ONE TOUCH ULTRA TEST test strip  04/11/14   Historical Provider, MD    No Known Allergies  Physical Exam  Vitals  Blood pressure 139/94, pulse 117, temperature 98.9 F (37.2 C), temperature source Oral, resp. rate 19, SpO2 100 %.   1. General elderly male with palpation lying in bed in mild distress due to pain   2. Normal affect and insight, Not Suicidal or Homicidal, Awake Alert, Oriented X 3.  3. No F.N deficits, ALL C.Nerves Intact, Strength  5/5 all 4 extremities, Sensation intact all 4 extremities, Plantars down going.  4. Ears and Eyes appear Normal, Conjunctivae clear, PERRLA. Moist Oral Mucosa.  5. Supple Neck, No JVD, No cervical lymphadenopathy appriciated, No Carotid Bruits.  6. Symmetrical Chest wall movement, Good air movement bilaterally, CTAB.  7. Tachycardic, No Gallops, Rubs or Murmurs, No Parasternal Heave.  8. Positive Bowel Sounds, Abdomen Soft, No tenderness, No organomegaly appriciated,No rebound -guarding or rigidity.  9.  No Cyanosis, Normal Skin Turgor, No Skin Rash or Bruise.  10. Good muscle tone,  joints appear normal , no effusions, Normal ROM.  11. No Palpable Lymph Nodes in Neck or Axillae    Data Review  CBC  Recent Labs Lab 11/23/14 1019 11/26/14 0634 11/26/14 0637  WBC 6.5 9.5  --   HGB 10.8* 10.3* 10.5*  HCT 32.3* 31.0* 31.0*  PLT 189 187  --   MCV 93.1 93.9  --   MCH 31.1 31.2  --   MCHC 33.4 33.2  --   RDW 14.2 14.6  --   LYMPHSABS 0.7* 0.8  --   MONOABS 0.6 1.0  --   EOSABS 0.6* 0.2  --   BASOSABS 0.0 0.0  --    ------------------------------------------------------------------------------------------------------------------  Chemistries   Recent Labs Lab 11/23/14 1019 11/26/14 0637  NA 141 139  K 3.8 3.3*  CL  --  104  CO2 25  --   GLUCOSE 145* 179*  BUN 15.4 20  CREATININE 0.8 0.70  CALCIUM 12.3*   --   AST 94*  --   ALT 28  --   ALKPHOS 157*  --   BILITOT 0.59  --    ------------------------------------------------------------------------------------------------------------------ estimated creatinine clearance is 125.6 mL/min (by C-G formula based on Cr of 0.7). ------------------------------------------------------------------------------------------------------------------ No results for input(s): TSH, T4TOTAL, T3FREE, THYROIDAB in the last 72 hours.  Invalid input(s): FREET3   Coagulation profile No results for input(s): INR, PROTIME in the last 168 hours. ------------------------------------------------------------------------------------------------------------------- No results for input(s): DDIMER in the last 72 hours. -------------------------------------------------------------------------------------------------------------------  Cardiac Enzymes No results for input(s): CKMB, TROPONINI, MYOGLOBIN in the last 168 hours.  Invalid input(s): CK ------------------------------------------------------------------------------------------------------------------ Invalid input(s): POCBNP   ---------------------------------------------------------------------------------------------------------------  Urinalysis No results found for: COLORURINE, APPEARANCEUR, LABSPEC, [place], GLUCOSEU, HGBUR, BILIRUBINUR, KETONESUR, PROTEINUR, UROBILINOGEN, NITRITE, LEUKOCYTESUR  ----------------------------------------------------------------------------------------------------------------  Imaging results:   No results found.     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 - We'll start on good bowel regimen. - The plan is for palliative radiation, at this point patient has no clinical signs of cord compression, good lower extremity strength, no urinary or stool incontinence, continue with Decadron. - Discussed with radiation oncology, no indication for emergent radiation therapy in absence of cord compression.  Diabetes mellitus - Hold metformin, will start on insulin sliding scale  Hypertension - Acceptable, resume home medication    DVT Prophylaxis Heparin -  AM Labs Ordered, also please review Full Orders  Family Communication: Admission, patients condition and plan of care  including tests being ordered have been discussed with the patient and sister who indicate understanding and agree with the plan and Code Status.  Code Status full  Likely DC to  doing further workup  Condition GUARDED    Time spent in minutes : 55 minutes    Maudy Yonan M.D on 11/26/2014 at 10:03 AM  Between 7am to 7pm - Pager - [phone]  After 7pm go to [email or web address] - password [email or web address]  And look for the night coverage person covering me after hours  [facility] Office  [phone]   **Disclaimer: This note may have been dictated with voice recognition software. Similar sounding words can inadvertently be transcribed and this note may contain transcription errors which may not have been corrected upon publication of note.**

## 2014-11-26 NOTE — ED Notes (Signed)
Patient continues to have 10/10 pain s/p admin of Fentanyl, see MAR PIV site established EDP made aware

## 2014-11-26 NOTE — Progress Notes (Signed)
  Radiation Oncology         [phone] ________________________________  Name: Kerry Peck MRN: [id number]  Date: 11/25/2014  DOB: 28-Aug-1960  SIMULATION AND TREATMENT PLANNING NOTE    ICD-9-CM ICD-10-CM   1. 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 Small cell lung cancer, unspecified laterality 162.9 C34.90     DIAGNOSIS:  Metastatic small cell lung cancer  NARRATIVE:  The patient was brought to the [facility].  Identity was confirmed.  All relevant records and images related to the planned course of therapy were reviewed.  The patient freely provided informed written consent to proceed with treatment after reviewing the details related to the planned course of therapy. The consent form was witnessed and verified by the simulation staff.  Then, the patient was set-up in a stable reproducible  supine position for radiation therapy.  CT images were obtained.  Surface markings were placed.  The CT images were loaded into the planning software.  Then the target and avoidance structures were contoured.  Treatment planning then occurred.  The radiation prescription was entered and confirmed.  Then, I designed and supervised the construction of a total of 4 medically necessary complex treatment devices.  I have requested : Isodose Plan.  I have ordered:dose calc.  PLAN:  The patient will receive 25 Gy in 10 fractions directed at the upper thoracic spine and lower thoracic spine. The patient will be treated with 2 separate isocenters in light of his previous treatment to the T6 area.  ________________________________  Special treatment procedure note   Patient is received 2 prior courses of radiation therapy directed at the chest area. Additional time was taken in reviewing the patient's previous treatments as it relates to his current set up. Given this the additional  time and increased potential for toxicities, this constitutes a special treatment procedure. -----------------------------------  Blair Promise, PhD, MD

## 2014-11-26 NOTE — ED Notes (Signed)
Patient reports that previously given pain medications have been minimally effective in decreasing pain Patient given additional dose of pain medication, see MAR Neb tx given, see MAR Patient continues to appear extremely uncomfortable VS updated and stable

## 2014-11-26 NOTE — Consult Note (Signed)
Patient XV:Kerry Peck      DOB: May 03, 1961      TAE:[id number]     Consult Note from the Palliative Medicine Team at [facility] Requested by: Dr Landis Gandy     PCP: Lujean Amel, MD Reason for Consultation: Pain Management     Phone [phone]  Assessment/Recommendations: 54 yo male with Extensive Stage SCLC complicated by liver, bone/spine mets. Admitted with cancer related pain  1.  Code Status: Full, I did not discuss  2. GOC: Plans for ongoing chemotherapy and radiation therapy.  Unclear to me what his understanding of his situation is. Will see if we can explore more when his symptoms are better controlled.    3. Symptom Management:   1. Cancer Related Pain: suspect being out of oxycontin and also disease progression (especially in spine) are major factors.  Used about 53m of IV dilaudid.  I think he will need higher dose of pain medication to control symptoms. He is starting to feel better this afternoon.  I will increase oxycontin to 680mBID and oxycodone to 10-1549mRN.  I instructed him to try using orals only and see if he can tolerate without IV. I will fu tomorrow as well.  Good that he is on steroids. Hopefully XRT can help as well. Recently received zometa which sometimes has improvement in bone met pain, but more strongly associated with bone pain relief in breast CA or Myeloma.  2. Loss of Appetite: On steroids. Will add remeron given his insomnia as well 3. Insomnia: Increase pain control as above. Will use remeron instead of benzo given opioid adjustments 4. Constipation- No BM in 3-4 days. Opioid induced is real concern. Agree with doc/senna and prn miralax. Will monitor as may need more aggressive regimen  4. Psychosocial/Spiritual: Lives at home with support from his sister who has been staying with him. Retired posTour managerBrief HPI: [age] male with PMHx of extensive stage SCLC s/p chemo, PCI, XRT to left hilar mass/pelvis/spine.  Recent hypercalcemia of malignancy s/p zometa. He was admitted from home with worsening of his cancer related pain. Pain is mostly in lower back radiating around flanks.  Has been present for several months and has been on oxycontin for few months as well.  Ran out of oxycontin last week and pain progressively worsening.  No acute inciting factors leading to admission.  Pain worse with movement.  Has sharp pains without associated neuropathic symptoms of N/T or burning.  He had been doubling dose of his home oxycodone without much relief.  Over past 3 weeks has had some associated difficulty with urinating and concern for urinary retention on admission. He has not had BM in 3-4 days.  Denies any weakness of his legs but complains of some numbness over his left thigh.  He has remained ambulatory. Complains of difficulty sleeping 2/2 pain.  Appetite has been poor and feels like he has been losing weight. Denies any anxiety/deprssion. No dyspnea, N/V.  MRI in ED revealed numerous spinal mets and concern for cord compression, though not markedly different from few days prior. Has been put back on home dose of oxycontin and receiving IV dilaudid. His pain is doing a bit better this afternoon.   ROS: Full ROS negative unless otherwise mentioned above    PMH:  Past Medical History  Diagnosis Date  . Diabetes mellitus without complication   . Hypertension   . Smoking addiction   . Cancer  lung and liver  . Radiation 08/16/14-09/06/14    prophylatic cranial radiation 25 gray, chest, t-spine  35 gray  . Radiation 09/29/14-10/12/14    left hilar area 30 gray, right pelvis 30 gray, right lower rib cage area 30 gray  . 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 No pertinent past surgical history. I have reviewed the [place] and SH and  If appropriate update it with new information. No Known Allergies Scheduled Meds: . amLODipine  10 mg Oral Daily  . antiseptic oral rinse  7 mL Mouth  Rinse BID  . aspirin EC  81 mg Oral Once per day on Sun Mon Tue Thu Fri Sat  . atorvastatin  40 mg Oral Daily  . [START ON 11/27/2014] calcium-vitamin D  1 tablet Oral Q breakfast  . dexamethasone  4 mg Oral QID  . docusate sodium  100 mg Oral BID  . fenofibrate  160 mg Oral Daily  . heparin  5,000 Units Subcutaneous 3 times per day  . insulin aspart  0-15 Units Subcutaneous TID WC  . metoprolol tartrate  25 mg Oral Daily  . mirtazapine  15 mg Oral QHS  . mometasone-formoterol  2 puff Inhalation BID  . OxyCODONE  60 mg Oral Q12H  . pantoprazole  40 mg Oral Daily  . pyridOXINE  100 mg Oral Daily  . senna  1 tablet Oral BID  . sucralfate  1 g Oral TID WC & HS  . vitamin C  500 mg Oral Daily   Continuous Infusions: . sodium chloride 50 mL/hr at 11/26/14 1021   PRN Meds:.acetaminophen **OR** acetaminophen, albuterol, bisacodyl, HYDROmorphone (DILAUDID) injection, magnesium citrate, ondansetron **OR** ondansetron (ZOFRAN) IV, oxyCODONE, polyethylene glycol, prochlorperazine    BP 123/84 mmHg  Pulse 100  Temp(Src) 98.1 F (36.7 C) (Oral)  Resp 20  Ht 6' 2"  (1.88 m)  Wt 89.948 kg (198 lb 4.8 oz)  BMI 25.45 kg/m2  SpO2 98%   PPS: 60   Intake/Output Summary (Last 24 hours) at 11/26/14 1942 Last data filed at 11/26/14 1849  Gross per 24 hour  Intake    960 ml  Output   1350 ml  Net   -390 ml    Physical Exam:  General: Alert, NAD HEENT:  [place], sclera anicteric, mmm Neck: supple Chest:   CTAB CVS: RRR Abdomen: soft, NT ND Ext: warm, no edema Neuro: no focal weakness of lower ext Skin: warm/dry Lymph: no palpable adenopathy MSK: diffuse back and midline tenderness  Labs: CBC    Component Value Date/Time   WBC 9.5 11/26/2014 0634   WBC 6.5 11/23/2014 1019   WBC 16.7* 03/27/2014 1200   RBC 3.30* 11/26/2014 0634   RBC 3.47* 11/23/2014 1019   RBC 4.03* 03/27/2014 1200   HGB 10.5* 11/26/2014 0637   HGB 10.8* 11/23/2014 1019   HGB 12.8* 03/27/2014 1200   HCT 31.0*  11/26/2014 0637   HCT 32.3* 11/23/2014 1019   HCT 38.8* 03/27/2014 1200   PLT 187 11/26/2014 0634   PLT 189 11/23/2014 1019   MCV 93.9 11/26/2014 0634   MCV 93.1 11/23/2014 1019   MCV 96.4 03/27/2014 1200   MCH 31.2 11/26/2014 0634   MCH 31.1 11/23/2014 1019   MCH 31.7* 03/27/2014 1200   MCHC 33.2 11/26/2014 0634   MCHC 33.4 11/23/2014 1019   MCHC 32.9 03/27/2014 1200   RDW 14.6 11/26/2014 0634   RDW 14.2 11/23/2014 1019   LYMPHSABS 0.8 11/26/2014 0634   LYMPHSABS  0.7* 11/23/2014 1019   MONOABS 1.0 11/26/2014 0634   MONOABS 0.6 11/23/2014 1019   EOSABS 0.2 11/26/2014 0634   EOSABS 0.6* 11/23/2014 1019   BASOSABS 0.0 11/26/2014 0634   BASOSABS 0.0 11/23/2014 1019    BMET    Component Value Date/Time   NA 139 11/26/2014 0637   NA 141 11/23/2014 1019   K 3.3* 11/26/2014 0637   K 3.8 11/23/2014 1019   CL 104 11/26/2014 0637   CO2 25 11/23/2014 1019   CO2 27 04/05/2014 1338   GLUCOSE 179* 11/26/2014 0637   GLUCOSE 145* 11/23/2014 1019   BUN 20 11/26/2014 0637   BUN 15.4 11/23/2014 1019   CREATININE 0.70 11/26/2014 0637   CREATININE 0.8 11/23/2014 1019   CALCIUM 12.3* 11/23/2014 1019   CALCIUM 8.9 04/05/2014 1338    CMP     Component Value Date/Time   NA 139 11/26/2014 0637   NA 141 11/23/2014 1019   K 3.3* 11/26/2014 0637   K 3.8 11/23/2014 1019   CL 104 11/26/2014 0637   CO2 25 11/23/2014 1019   CO2 27 04/05/2014 1338   GLUCOSE 179* 11/26/2014 0637   GLUCOSE 145* 11/23/2014 1019   BUN 20 11/26/2014 0637   BUN 15.4 11/23/2014 1019   CREATININE 0.70 11/26/2014 0637   CREATININE 0.8 11/23/2014 1019   CALCIUM 12.3* 11/23/2014 1019   CALCIUM 8.9 04/05/2014 1338   PROT 7.8 11/23/2014 1019   PROT 6.4 04/05/2014 1338   ALBUMIN 3.2* 11/23/2014 1019   ALBUMIN 4.0 04/05/2014 1338   AST 94* 11/23/2014 1019   AST 13 04/05/2014 1338   ALT 28 11/23/2014 1019   ALT 17 04/05/2014 1338   ALKPHOS 157* 11/23/2014 1019   ALKPHOS 139* 04/05/2014 1338   BILITOT 0.59  11/23/2014 1019   BILITOT 0.5 04/05/2014 1338   3/30 MRI T-spine IMPRESSION: 1. Diffuse metastatic disease throughout the entire thoracic spine with the exception of sparing of the T7 vertebra. 2. Extensive epidural tumor on the left at T2-3 which deviates spinal cord to the right. 3. Multiple pathologic fractures with slight protrusions of the posterior aspects of T3, T8 and T9 into the spinal canal with extension into the left neural foramen at T9-10.  4/2 MRI T-spine IMPRESSION: Please note sagittal images proceed from left to right, contrary to our normal convention.  No change since the study of 3 days ago. There could be potential for some cord compressive symptoms based on the disease at T1-2 and T9.    Doran Clay D.O. Palliative Medicine Team at [facility]  Pager: [phone] Team Phone: [phone]

## 2014-11-26 NOTE — ED Notes (Signed)
Report called to [facility] on 3 W.

## 2014-11-27 ENCOUNTER — Ambulatory Visit
Admit: 2014-11-27 | Discharge: 2014-11-27 | Disposition: A | Discharge status: Home / Self Care | Payer: Federal, State, Local not specified - PPO | Attending: Radiation Oncology | Admitting: Radiation Oncology

## 2014-11-27 LAB — BASIC METABOLIC PANEL
Anion gap: 11 (ref 5–15)
BUN: 15 mg/dL (ref 6–23)
CHLORIDE: 103 mmol/L (ref 96–112)
CO2: 25 mmol/L (ref 19–32)
Calcium: 9.2 mg/dL (ref 8.4–10.5)
Creatinine, Ser: 0.61 mg/dL (ref 0.50–1.35)
GFR calc non Af Amer: >90 mL/min (ref >90)
GLUCOSE: 293 mg/dL — AB (ref 70–99)
Potassium: 3.6 mmol/L (ref 3.5–5.1)
Sodium: 139 mmol/L (ref 135–145)

## 2014-11-27 LAB — GLUCOSE, CAPILLARY
GLUCOSE-CAPILLARY: 214 mg/dL — AB (ref 70–99)
Glucose-Capillary: 220 mg/dL — ABNORMAL HIGH (ref 70–99)
Glucose-Capillary: 292 mg/dL — ABNORMAL HIGH (ref 70–99)

## 2014-11-27 LAB — CBC
HEMATOCRIT: 29.1 % — AB (ref 39.0–52.0)
Hemoglobin: 9.5 g/dL — ABNORMAL LOW (ref 13.0–17.0)
MCH: 30.9 pg (ref 26.0–34.0)
MCHC: 32.6 g/dL (ref 30.0–36.0)
MCV: 94.8 fL (ref 78.0–100.0)
Platelets: 181 10*3/uL (ref 150–400)
RBC: 3.07 MIL/uL — ABNORMAL LOW (ref 4.22–5.81)
RDW: 14.6 % (ref 11.5–15.5)
WBC: 6.6 10*3/uL (ref 4.0–10.5)

## 2014-11-27 MED ORDER — METFORMIN HCL 500 MG PO TABS
1000.0000 mg | ORAL_TABLET | Freq: Two times a day (BID) | ORAL | Status: DC
  Administered 2014-11-27 – 2014-11-29 (×5): 1000 mg via ORAL
  Filled 2014-11-27 (×7): qty 2

## 2014-11-27 MED ORDER — HYDROMORPHONE HCL 2 MG/ML IJ SOLN
2.0000 mg | Freq: Once | INTRAMUSCULAR | Status: AC | PRN
  Administered 2014-11-27: 2 mg via INTRAVENOUS
  Filled 2014-11-27: qty 1

## 2014-11-27 MED ORDER — BOOST PLUS PO LIQD
237.0000 mL | ORAL | Status: DC | PRN
  Filled 2014-11-27: qty 237

## 2014-11-27 NOTE — Progress Notes (Addendum)
Patient Demographics  Kerry Peck, is a 54 y.o. male, DOB - [birth year], BSW:[id number]  Admit date - 11/26/2014   Admitting Physician Albertine Patricia, MD  Outpatient Primary MD for the patient is Lujean Amel, MD  LOS - 1   Chief Complaint  Patient presents with  . Back Pain      Admission history of present illness/brief narrative: Kerry Peck is a 54 y.o. 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 Pablo Ledger from radiation oncology as patient declined to be admitted for pain control, but he could not tolerate the pain anymore at home, so he presented to ED, there are no new neurological deficits, no urinary or stool incontinence, no loss of sensation, patient received IV Dilaudid in ED with minimal relief of pain, so hospitalist requested to admit the patient. Patient developed urinary retention, unclear if this is related to pain medication versus hoping cord compression, so thoracic spine was done, showing no significant changes from one done on 3/30, but developing urinary retention was concerning for early signs of cord compression, so patient started radiation therapy on 11/27/58.  Subjective:   Kerry Peck today has, No headache, No chest pain, No abdominal pain - No Nausea, had brief episode of left arm tingling, which was related to him sleeping on his left arm, resolved this a.m., no other new neurological symptoms or findings.  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 - Pain is well controlled on increased dose MS Contin, only required 3 doses of when necessary oxycodone. - Patient developed urinary retention yesterday, unclear if this is related to development of cord compression symptoms versus pain medication, but given his cord displacement, epidural tumor, he was started on radiation therapy. - Continue with Decadron, continue with Protonix as on steroids. - Follow with Dr. Earlie Server as an outpatient.  Diabetes mellitus - Hold metformin,  on insulin sliding scale, CBGs uncontrolled, will resume on metformin  Hypertension - Acceptable, continue with metoprolol, amlodipine,   Protein calorie malnutrition - continue with boost plus  Hyperlipidemia - Continue with statin   Code Status: Full  Family Communication: None at bedside  Disposition Plan: Home when stable   Procedures  Radiation therapy started 4/3   Consults   Radiation oncology Palliative care    Medications  Scheduled Meds: . amLODipine  10 mg Oral Daily  . antiseptic oral rinse  7 mL Mouth Rinse BID  . aspirin EC  81 mg Oral Once per day on  Dorene Grebe Tue Thu Fri Sat  . atorvastatin  40 mg Oral Daily  . calcium-vitamin D  1 tablet Oral Q breakfast  . dexamethasone  4 mg Oral QID  . docusate sodium  100 mg Oral BID  . fenofibrate  160 mg Oral Daily  . heparin  5,000 Units Subcutaneous 3 times per day  . insulin aspart  0-15 Units Subcutaneous TID WC  . metoprolol tartrate  25 mg Oral Daily  . mirtazapine  15 mg Oral QHS  . mometasone-formoterol  2 puff Inhalation BID  .  OxyCODONE  60 mg Oral Q12H  . pantoprazole  40 mg Oral Daily  . pyridOXINE  100 mg Oral Daily  . senna  1 tablet Oral BID  . sucralfate  1 g Oral TID WC & HS  . vitamin C  500 mg Oral Daily   Continuous Infusions: . sodium chloride 50 mL/hr at 11/27/14 0525   PRN Meds:.acetaminophen **OR** acetaminophen, albuterol, bisacodyl, HYDROmorphone (DILAUDID) injection, ondansetron **OR** ondansetron (ZOFRAN) IV, oxyCODONE, polyethylene glycol, prochlorperazine  DVT Prophylaxis  - Heparin -   Lab Results  Component Value Date   PLT 181 11/27/2014    Antibiotics   Anti-infectives    None          Objective:   Filed Vitals:   11/26/14 1455 11/26/14 2032 11/27/14 0420 11/27/14 0959  BP: 123/84 122/79 143/84   Pulse: 100 81 80   Temp: 98.1 F (36.7 C) 98 F (36.7 C) 97.7 F (36.5 C)   TempSrc: Oral Oral Oral   Resp: 20 18 18    Height:      Weight:      SpO2: 98% 99% 98% 96%    Wt Readings from Last 3 Encounters:  11/26/14 89.948 kg (198 lb 4.8 oz)  11/23/14 90.81 kg (200 lb 3.2 oz)  11/23/14 90.719 kg (200 lb)     Intake/Output Summary (Last 24 hours) at 11/27/14 1241 Last data filed at 11/27/14 1100  Gross per 24 hour  Intake   1440 ml  Output   3250 ml  Net  -1810 ml     Physical Exam  Awake Alert, Oriented X 3, No new F.N deficits, Normal affect [place].AT,PERRAL Supple Neck,No JVD, No cervical lymphadenopathy appriciated.  Symmetrical Chest wall movement, Good air movement bilaterally, CTAB RRR,No Gallops,Rubs or new Murmurs, No Parasternal Heave +ve B.Sounds, Abd Soft, No tenderness, No organomegaly appriciated, No rebound - guarding or rigidity. No Cyanosis, Clubbing or edema, No new Rash or bruise  , lower extremity strength 5 out of 5, upper 5 out of 5, sensation is symmetrical and intact, deep tendon reflexes intact.   Data Review   Micro Results No results found for this or any previous visit (from the past 240 hour(s)).  Radiology Reports Mr  Thoracic Spine W Wo Contrast  11/26/2014   CLINICAL DATA:  Known spinal metastatic disease. Unable to bear weight. Urinary retention. Severe pain.  EXAM: MRI THORACIC SPINE WITHOUT AND WITH CONTRAST  TECHNIQUE: Multiplanar and multiecho pulse sequences of the thoracic spine were obtained without and with intravenous contrast.  CONTRAST:  57mL MULTIHANCE GADOBENATE DIMEGLUMINE 529 MG/ML IV SOLN  COMPARISON:  11/23/2014  FINDINGS: Sagittal images proceed from left to right, contrary to our normal convention.  No change is appreciated since the study of 3 days ago. There is diffuse marrow involvement by metastatic tumor throughout the region image to including the sternum, ribs and spine. At the T1-2 level, there is extraosseous  tumor within the left side of the spinal canal which displaces the cord towards the right and could cause cord compressive symptoms.  At T2-3, there is a small amount of epidural tumor on the right which contacts the cord but does not appear to cause cord compression.  At T8, there is some ventral epidural tumor that contacts the ventral aspect of the cord but does not appear to cause cord compression.  At T9, there is ventral epidural tumor that contacts the ventral aspect of the cord and may deform the cord slightly on the left.  At T10, there is a small amount of ventral epidural tumor without cord compression.  Partial compression fractures are present at multiple levels, unchanged since the previous study. These include T3, T8, T9, T10, T11 and T12.  I do not identify any intra medullary metastatic disease in the region.  IMPRESSION: Please note sagittal images proceed from left to right, contrary to our normal convention.  No change since the study of 3 days ago. There could be potential for some cord compressive symptoms based on the disease at T1-2 and T9.  Critical Value/emergent results were called by telephone at the time of interpretation on 11/26/2014 at 2:42 pm to Dr. Phillips Climes , who verbally acknowledged these results.   Electronically Signed   By: Nelson Chimes M.D.   On: 11/26/2014 14:43    CBC  Recent Labs Lab 11/23/14 1019 11/26/14 0634 11/26/14 0637 11/27/14 0455  WBC 6.5 9.5  --  6.6  HGB 10.8* 10.3* 10.5* 9.5*  HCT 32.3* 31.0* 31.0* 29.1*  PLT 189 187  --  181  MCV 93.1 93.9  --  94.8  MCH 31.1 31.2  --  30.9  MCHC 33.4 33.2  --  32.6  RDW 14.2 14.6  --  14.6  LYMPHSABS 0.7* 0.8  --   --   MONOABS 0.6 1.0  --   --   EOSABS 0.6* 0.2  --   --   BASOSABS 0.0 0.0  --   --     Chemistries   Recent Labs Lab 11/23/14 1019 11/26/14 0637 11/27/14 0455  NA 141 139 139  K 3.8 3.3* 3.6  CL  --  104 103  CO2 25  --  25  GLUCOSE 145* 179* 293*  BUN 15.4 20 15   CREATININE 0.8 0.70 0.61  CALCIUM 12.3*  --  9.2  AST 94*  --   --   ALT 28  --   --   ALKPHOS 157*  --   --   BILITOT 0.59  --   --    ------------------------------------------------------------------------------------------------------------------ estimated creatinine clearance is 125.6 mL/min (by C-G formula based on Cr of 0.61). ------------------------------------------------------------------------------------------------------------------ No results for input(s): HGBA1C in the last 72 hours. ------------------------------------------------------------------------------------------------------------------ No results for input(s): CHOL, HDL, LDLCALC, TRIG, CHOLHDL, LDLDIRECT in the last 72 hours. ------------------------------------------------------------------------------------------------------------------ No results for input(s): TSH, T4TOTAL, T3FREE, THYROIDAB in the last 72 hours.  Invalid input(s): FREET3 ------------------------------------------------------------------------------------------------------------------ No results for input(s): VITAMINB12, FOLATE, FERRITIN, TIBC, IRON, RETICCTPCT in the last 72 hours.  Coagulation profile No results for  input(s): INR, PROTIME in the last 168 hours.  No results for input(s): DDIMER in the last 72 hours.  Cardiac Enzymes No results for input(s): CKMB, TROPONINI, MYOGLOBIN in the last 168 hours.  Invalid input(s): CK ------------------------------------------------------------------------------------------------------------------ Invalid input(s): POCBNP     Time Spent in minutes   35 minutes   Tyrika Newman M.D on 11/27/2014 at 12:41 PM  Between 7am to 7pm - Pager - [phone]  After 7pm go to [email or web address] - password [email or web address]  And look for the night coverage person covering for me after hours  [facility] Office  [phone]   **Disclaimer: This note may have been dictated with voice recognition software. Similar sounding words can inadvertently be transcribed and this note may contain transcription errors which may not have been corrected upon publication of note.**

## 2014-11-27 NOTE — Therapy (Signed)
[facility] Dept Therapy Treatment Record Phone [phone]   Radiation Therapy was administered to Kerry Peck on: 11/27/2014  7:06 AM and was treatment # 1 out of a planned course of 1 treatments.

## 2014-11-27 NOTE — Progress Notes (Signed)
INITIAL NUTRITION ASSESSMENT  DOCUMENTATION CODES Per approved criteria  -Severe malnutrition in the context of chronic illness  Pt meets criteria for severe MALNUTRITION in the context of chronic illness as evidenced by 15% weight loss x 3.5 months and energy intake <75% for >/=1 month .  INTERVENTION: Provide Boost Plus PRN  Encourage PO intake  NUTRITION DIAGNOSIS: Malnutrition related to lung and lung cancer with associated treatments as evidenced by 15% weight loss x 3.5 months and energy intake <75% for >/=1 month.   Goal: Pt to meet >/= 90% of their estimated nutrition needs   Monitor:  PO and supplemental intake, weight, labs, I/O's  Reason for Assessment: Pt identified as at nutrition risk on the Malnutrition Screen Tool  Admitting Dx: Cancer associated pain  ASSESSMENT: 54 y.o. 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  Pt reports good appetite and eating well now. PTA pt states he would eat normally but would vomit half of what he would eat. Main foods that would trigger these symptoms would be greasy or fried foods he would eat.  Pt states he drinks Boost supplements at home. Pt declines supplements today as he is eating 100% and does not feel he needs to order. He states "I think I'm discharging tomorrow". RD to order PRN for pt.  Pt with 35 lb weight loss since 1/19 (15% weight loss x 3.5 months, significant for time frame).  Pt presents with mild depletion in clavicle regions.  Labs reviewed: CBGs: 220-261  Height: Ht Readings from Last 1 Encounters:  11/26/14 6\' 2"  (1.88 m)    Weight: Wt Readings from Last 1 Encounters:  11/26/14 198 lb 4.8 oz (89.948 kg)    Ideal Body Weight: 190 lb  % Ideal Body Weight:  104%  Wt Readings from Last 10 Encounters:  11/26/14 198 lb 4.8 oz (89.948 kg)  11/23/14 200 lb 3.2 oz (90.81 kg)  11/23/14 200 lb (90.719 kg)  11/21/14 199 lb 8 oz (90.493 kg)  09/13/14 233 lb 8 oz (105.915 kg)  08/23/14 235 lb 9.6 oz (106.867 kg)  08/08/14 247 lb (112.038 kg)  07/12/14 238 lb 4.8 oz (108.092 kg)  06/21/14 237 lb 6.4 oz (107.684 kg)  05/31/14 237 lb 12.8 oz (107.865 kg)    Usual Body Weight: 238 lb -per pt  % Usual Body Weight: 83%  BMI:  Body mass index is 25.45 kg/(m^2).  Estimated Nutritional Needs: Kcal: 2400-2500 Protein: 120-130g Fluid: 2.4L/day  Skin: intact  Diet Order: Diet heart healthy/carb modified Room service appropriate?: Yes; Fluid consistency:: Thin  EDUCATION NEEDS: -No education needs identified at this time   Intake/Output Summary (Last 24 hours) at 11/27/14 1138 Last data filed at 11/27/14 0420  Gross per 24 hour  Intake    960 ml  Output   3000 ml  Net  -2040 ml    Last BM: 3/30  Labs:   Recent Labs Lab 11/23/14 1019 11/26/14 0637 11/27/14 0455  NA 141 139 139  K 3.8 3.3* 3.6  CL  --  104 103  CO2 25  --  25  BUN 15.4 20 15   CREATININE 0.8 0.70 0.61  CALCIUM 12.3*  --  9.2  GLUCOSE 145* 179* 293*    CBG (last 3)   Recent Labs  11/26/14 1716 11/26/14 2051 11/27/14 0734  GLUCAP 261* 246* 220*    Scheduled Meds: . amLODipine  10 mg Oral Daily  . antiseptic oral rinse  7 mL Mouth Rinse BID  . aspirin EC  81 mg Oral Once per day on Sun Mon Tue Thu Fri Sat  . atorvastatin  40 mg Oral Daily  . calcium-vitamin D  1 tablet Oral Q breakfast  . dexamethasone  4 mg Oral QID  . docusate sodium  100 mg Oral BID  . fenofibrate  160 mg Oral Daily  . heparin  5,000 Units Subcutaneous 3 times per day  . insulin aspart  0-15 Units Subcutaneous TID WC  . metoprolol tartrate  25 mg Oral Daily  . mirtazapine  15 mg Oral QHS  . mometasone-formoterol  2 puff Inhalation BID  . OxyCODONE  60 mg Oral Q12H  .  pantoprazole  40 mg Oral Daily  . pyridOXINE  100 mg Oral Daily  . senna  1 tablet Oral BID  . sucralfate  1 g Oral TID WC & HS  . vitamin C  500 mg Oral Daily    Continuous Infusions: . sodium chloride 50 mL/hr at 11/27/14 1700    Past Medical History  Diagnosis Date  . Diabetes mellitus without complication   . Hypertension   . Smoking addiction   . Cancer     lung and liver  . Radiation 08/16/14-09/06/14    prophylatic cranial radiation 25 gray, chest, t-spine  35 gray  . Radiation 09/29/14-10/12/14    left hilar area 30 gray, right pelvis 30 gray, right lower rib cage area 30 gray  . 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 No pertinent past surgical history.  Clayton Bibles, MS, RD, LDN Pager: [phone] After Hours Pager: [phone]

## 2014-11-27 NOTE — Progress Notes (Signed)
Patient Kerry Peck      DOB: 05-27-1961      DGL:[id number]   Palliative Medicine Team at [facility] Progress Note    Subjective: Pain better controlled today. Between 3-4. Slept better overnight as pain less of issue.  Eating breakfast this morning. No BM yet, but feels like he will go today.      Filed Vitals:   11/27/14 0420  BP: 143/84  Pulse: 80  Temp: 97.7 F (36.5 C)  Resp: 18   Physical exam: GEN: alert, NAD CV: RRR LUNGS: CTAB ABD: soft, NT ND EXT: no edema Skin: warm/dry  CBC    Component Value Date/Time   WBC 6.6 11/27/2014 0455   WBC 6.5 11/23/2014 1019   WBC 16.7* 03/27/2014 1200   RBC 3.07* 11/27/2014 0455   RBC 3.47* 11/23/2014 1019   RBC 4.03* 03/27/2014 1200   HGB 9.5* 11/27/2014 0455   HGB 10.8* 11/23/2014 1019   HGB 12.8* 03/27/2014 1200   HCT 29.1* 11/27/2014 0455   HCT 32.3* 11/23/2014 1019   HCT 38.8* 03/27/2014 1200   PLT 181 11/27/2014 0455   PLT 189 11/23/2014 1019   MCV 94.8 11/27/2014 0455   MCV 93.1 11/23/2014 1019   MCV 96.4 03/27/2014 1200   MCH 30.9 11/27/2014 0455   MCH 31.1 11/23/2014 1019   MCH 31.7* 03/27/2014 1200   MCHC 32.6 11/27/2014 0455   MCHC 33.4 11/23/2014 1019   MCHC 32.9 03/27/2014 1200   RDW 14.6 11/27/2014 0455   RDW 14.2 11/23/2014 1019   LYMPHSABS 0.8 11/26/2014 0634   LYMPHSABS 0.7* 11/23/2014 1019   MONOABS 1.0 11/26/2014 0634   MONOABS 0.6 11/23/2014 1019   EOSABS 0.2 11/26/2014 0634   EOSABS 0.6* 11/23/2014 1019   BASOSABS 0.0 11/26/2014 0634   BASOSABS 0.0 11/23/2014 1019    CMP     Component Value Date/Time   NA 139 11/27/2014 0455   NA 141 11/23/2014 1019   K 3.6 11/27/2014 0455   K 3.8 11/23/2014 1019   CL 103 11/27/2014 0455   CO2 25 11/27/2014 0455   CO2 25 11/23/2014 1019   GLUCOSE 293* 11/27/2014 0455   GLUCOSE 145* 11/23/2014 1019   BUN 15 11/27/2014 0455   BUN 15.4 11/23/2014 1019   CREATININE 0.61 11/27/2014 0455   CREATININE 0.8 11/23/2014 1019   CALCIUM 9.2  11/27/2014 0455   CALCIUM 12.3* 11/23/2014 1019   PROT 7.8 11/23/2014 1019   PROT 6.4 04/05/2014 1338   ALBUMIN 3.2* 11/23/2014 1019   ALBUMIN 4.0 04/05/2014 1338   AST 94* 11/23/2014 1019   AST 13 04/05/2014 1338   ALT 28 11/23/2014 1019   ALT 17 04/05/2014 1338   ALKPHOS 157* 11/23/2014 1019   ALKPHOS 139* 04/05/2014 1338   BILITOT 0.59 11/23/2014 1019   BILITOT 0.5 04/05/2014 1338   GFRNONAA >90 11/27/2014 0455   GFRAA >90 11/27/2014 0455      Assessment and plan: 54 yo male with Extensive Stage SCLC complicated by liver, bone/spine mets. Admitted with cancer related pain  1. Code Status: Full, I did not discuss  2. GOC: Plans for ongoing chemotherapy and radiation therapy. I don't think he has great health literacy. Will need plan for Onc follow-up in addition to radiation.     3. Symptom Management:  1. Cancer Related Pain: doing better. Only required 3 doses of PRN oxycodone. Did get IV dilaudid for radiation sim this morning. Would continue current opioid regimen in addition to other treatment  modalities.  2. Loss of Appetite: On steroids. remeron 3. Insomnia: Lorrin Mais helped last night. Would try to only use low dose if needed.  4. Constipation- continue bowel regimen. Would be more aggressive if he does not have BM today.   4. Psychosocial/Spiritual: Lives at home with support from his sister who has been staying with him. Retired Tour manager  Doran Clay D.O. Palliative Medicine Team at [facility]  Pager: [phone] Team Phone: [phone]

## 2014-11-27 NOTE — Progress Notes (Signed)
Inpatient simple simulation and treatment planning note  Patient was brought down to radiation oncology this morning due to new onset urinary rentention yesterday.  Of note, the patient reports that the "left arm numbness" documented in the chart last night was transient and may be related to his positioning while watching the NCAA tournament.    He has a urinary catheter now. His pain is currently 3/10. He has meet with palliative care.  I have reviewed his chart and spoken with Dr. Sondra Come, the patient's primary radiation oncologist about the patient.  I have spoken with Dr Waldron Labs as well. I reviewed the patient's T spine MRI.  He does not have a frank cord compression but I decided to treat him given the cord displacement, epidural tumor, and new onset urinary retention.  The patient was laid in the supine position on the LINAC with arms in a wingboard.  Portal imaging was obtained of his spine.  The field length and width were determined. Dose calculations we made with the radiation therapists.  Plan devised to treat C7-T12 to 3 Gy in one fraction with AP/PA fields (no MLCs or wedges) with 15 MV photons.  3 Gy was delivered from approximately C7-T12 this morning.  Anticipate resuming radiotherapy tomorrow afternoon after Dr. Sondra Come completes a formal radiotherapy plan.  -----------------------------------  Eppie Gibson, MD Radiation Oncology, On-Call physician

## 2014-11-28 ENCOUNTER — Ambulatory Visit
Admit: 2014-11-28 | Discharge: 2014-11-28 | Disposition: A | Discharge status: Home / Self Care | Payer: Federal, State, Local not specified - PPO | Attending: Radiation Oncology | Admitting: Radiation Oncology

## 2014-11-28 ENCOUNTER — Telehealth: Payer: Self-pay | Admitting: Oncology

## 2014-11-28 DIAGNOSIS — E43 Unspecified severe protein-calorie malnutrition: Secondary | ICD-10-CM | POA: Insufficient documentation

## 2014-11-28 LAB — GLUCOSE, CAPILLARY
GLUCOSE-CAPILLARY: 238 mg/dL — AB (ref 70–99)
GLUCOSE-CAPILLARY: 305 mg/dL — AB (ref 70–99)
GLUCOSE-CAPILLARY: 264 mg/dL — AB (ref 70–99)
Glucose-Capillary: 209 mg/dL — ABNORMAL HIGH (ref 70–99)

## 2014-11-28 MED ORDER — INSULIN GLARGINE 100 UNIT/ML ~~LOC~~ SOLN
10.0000 [IU] | Freq: Every day | SUBCUTANEOUS | Status: DC
  Filled 2014-11-28: qty 0.1

## 2014-11-28 MED ORDER — INSULIN GLARGINE 100 UNIT/ML ~~LOC~~ SOLN
10.0000 [IU] | Freq: Every day | SUBCUTANEOUS | Status: DC
  Administered 2014-11-28 – 2014-11-29 (×2): 10 [IU] via SUBCUTANEOUS
  Filled 2014-11-28 (×2): qty 0.1

## 2014-11-28 MED ORDER — TAMSULOSIN HCL 0.4 MG PO CAPS
0.4000 mg | ORAL_CAPSULE | Freq: Every day | ORAL | Status: DC
  Administered 2014-11-28 – 2014-11-29 (×2): 0.4 mg via ORAL
  Filled 2014-11-28 (×2): qty 1

## 2014-11-28 MED ORDER — POLYETHYLENE GLYCOL 3350 17 G PO PACK
17.0000 g | PACK | Freq: Every day | ORAL | Status: DC
  Administered 2014-11-28 – 2014-11-29 (×2): 17 g via ORAL
  Filled 2014-11-28 (×2): qty 1

## 2014-11-28 MED ORDER — INSULIN STARTER KIT- PEN NEEDLES (ENGLISH)
1.0000 | Freq: Once | Status: AC
  Administered 2014-11-28: 1
  Filled 2014-11-28: qty 1

## 2014-11-28 MED ORDER — MAGNESIUM CITRATE PO SOLN
1.0000 | Freq: Once | ORAL | Status: AC
  Administered 2014-11-28: 1 via ORAL
  Filled 2014-11-28: qty 296

## 2014-11-28 NOTE — Progress Notes (Signed)
Patient had large bowel movement and independently voided.  Unable to secure amount as patient stated he had bowel movement and urinated all in same event.  Bladder scanning completed and patient had 25ccs remaining in bladder.

## 2014-11-28 NOTE — Progress Notes (Signed)
Patient Kerry Peck      DOB: 1960-09-03      QJF:[id number]   Palliative Medicine Team at [facility] Progress Note    Subjective: Pain under good control. No BM yet. Appetite good. Sleeping better.     Filed Vitals:   11/28/14 0513  BP: 134/63  Pulse: 79  Temp: 98.1 F (36.7 C)  Resp: 20   Physical exam: GEN: alert, NAD CV: RRR LUNGS: CTAB ABD: soft, NT ND EXT: no edema Skin: warm/dry  CBC    Component Value Date/Time   WBC 6.6 11/27/2014 0455   WBC 6.5 11/23/2014 1019   WBC 16.7* 03/27/2014 1200   RBC 3.07* 11/27/2014 0455   RBC 3.47* 11/23/2014 1019   RBC 4.03* 03/27/2014 1200   HGB 9.5* 11/27/2014 0455   HGB 10.8* 11/23/2014 1019   HGB 12.8* 03/27/2014 1200   HCT 29.1* 11/27/2014 0455   HCT 32.3* 11/23/2014 1019   HCT 38.8* 03/27/2014 1200   PLT 181 11/27/2014 0455   PLT 189 11/23/2014 1019   MCV 94.8 11/27/2014 0455   MCV 93.1 11/23/2014 1019   MCV 96.4 03/27/2014 1200   MCH 30.9 11/27/2014 0455   MCH 31.1 11/23/2014 1019   MCH 31.7* 03/27/2014 1200   MCHC 32.6 11/27/2014 0455   MCHC 33.4 11/23/2014 1019   MCHC 32.9 03/27/2014 1200   RDW 14.6 11/27/2014 0455   RDW 14.2 11/23/2014 1019   LYMPHSABS 0.8 11/26/2014 0634   LYMPHSABS 0.7* 11/23/2014 1019   MONOABS 1.0 11/26/2014 0634   MONOABS 0.6 11/23/2014 1019   EOSABS 0.2 11/26/2014 0634   EOSABS 0.6* 11/23/2014 1019   BASOSABS 0.0 11/26/2014 0634   BASOSABS 0.0 11/23/2014 1019    CMP     Component Value Date/Time   NA 139 11/27/2014 0455   NA 141 11/23/2014 1019   K 3.6 11/27/2014 0455   K 3.8 11/23/2014 1019   CL 103 11/27/2014 0455   CO2 25 11/27/2014 0455   CO2 25 11/23/2014 1019   GLUCOSE 293* 11/27/2014 0455   GLUCOSE 145* 11/23/2014 1019   BUN 15 11/27/2014 0455   BUN 15.4 11/23/2014 1019   CREATININE 0.61 11/27/2014 0455   CREATININE 0.8 11/23/2014 1019   CALCIUM 9.2 11/27/2014 0455   CALCIUM 12.3* 11/23/2014 1019   PROT 7.8 11/23/2014 1019   PROT 6.4 04/05/2014  1338   ALBUMIN 3.2* 11/23/2014 1019   ALBUMIN 4.0 04/05/2014 1338   AST 94* 11/23/2014 1019   AST 13 04/05/2014 1338   ALT 28 11/23/2014 1019   ALT 17 04/05/2014 1338   ALKPHOS 157* 11/23/2014 1019   ALKPHOS 139* 04/05/2014 1338   BILITOT 0.59 11/23/2014 1019   BILITOT 0.5 04/05/2014 1338   GFRNONAA >90 11/27/2014 0455   GFRAA >90 11/27/2014 0455      Assessment and plan: 54 yo male with Extensive Stage SCLC complicated by liver, bone/spine mets. Admitted with cancer related pain  1. Code Status: Full, I did not discuss  2. GOC: Plans for ongoing chemotherapy and radiation therapy. I don't think he has great health literacy or understanding of his situation. I think this is outside of his generally positive attitude.  He plans to follow with Dr Julien Nordmann after XRT complete for further chemotherapy. . Will need plan for Onc follow-up in addition to radiation. Unfortunately our institution does not have an outpatient palliative care program to manage pain and work with him on goals going forward.    3. Symptom Management:  1. Cancer Related Pain: reports good control. Continue current regimen 2. Loss of Appetite: On steroids. Remeron. Doing much better 3. Insomnia: mirtazapine.  4. Constipation- continue bowel regimen. I will add daily miralax scheduled. No BM and not taking PRN.   4. Psychosocial/Spiritual: Lives at home with support from his sister who has been staying with him. Retired Tour manager  Doran Clay D.O. Palliative Medicine Team at [facility]  Pager: [phone] Team Phone: [phone]

## 2014-11-28 NOTE — Telephone Encounter (Signed)
Called Al, RN on [address].  Informed him that Efstathios has an appointment at 4 pm today for radiation.  Al verbalized agreement.

## 2014-11-28 NOTE — Evaluation (Signed)
Physical Therapy Evaluation Patient Details Name: Park Beck MRN: [id number] DOB: Jul 10, 1961 Today's Date: 11/28/2014   History of Present Illness  54 yo male admitted wtih cancer associated pain. Stage IV SCLC with Tspine mets (T1-T2, T9 tumors with potential for cord compression symptoms).   Clinical Impression  On eval, pt required Min assist for mobility-able to ambulate ~75 feet with RW. Pain rated as 8/10 with activity. Ambulation is effortful. Assisted pt back to bed at his request. Will follow.     Follow Up Recommendations Home health PT;Supervision/Assistance - 24 hour    Equipment Recommendations  None recommended by PT    Recommendations for Other Services       Precautions / Restrictions Precautions Precautions: Fall Precaution Comments: logroll technique for safety/comfort Restrictions Weight Bearing Restrictions: No      Mobility  Bed Mobility Overal bed mobility: Needs Assistance Bed Mobility: Rolling;Sidelying to Sit;Sit to Sidelying Rolling: Min guard Sidelying to sit: Min assist     Sit to sidelying: Min assist General bed mobility comments: Assist for trunk and LEs. Increased time.   Transfers Overall transfer level: Needs assistance Equipment used: Rolling walker (2 wheeled) Transfers: Sit to/from Stand Sit to Stand: Min assist;From elevated surface         General transfer comment: Assist to rise, stabilize, control descent.   Ambulation/Gait Ambulation/Gait assistance: Min assist Ambulation Distance (Feet): 75 Feet Assistive device: Rolling walker (2 wheeled) Gait Pattern/deviations: Step-through pattern;Decreased stride length;Trunk flexed     General Gait Details: Assist to stabilize. Ambulation is effortful. Pain increased from 6-8 with activity  Stairs            Wheelchair Mobility    Modified Rankin (Stroke Patients Only)       Balance Overall balance assessment: Needs assistance         Standing balance  support: Bilateral upper extremity supported;During functional activity Standing balance-Leahy Scale: Poor                               Pertinent Vitals/Pain Pain Assessment: 0-10 Pain Score: 8  Pain Location: T-L area of back, bil hips Pain Descriptors / Indicators: Aching;Sharp;Sore Pain Intervention(s): Repositioned;Monitored during session;Limited activity within patient's tolerance    Home Living Family/patient expects to be discharged to:: Private residence Living Arrangements: Other relatives (sister) Available Help at Discharge: Family Type of Home: House Home Access: Stairs to enter   Technical brewer of Steps: 1+1 Home Layout: One level Home Equipment: Cane - single point;Walker - 2 wheels      Prior Function Level of Independence: Independent with assistive device(s);Needs assistance   Gait / Transfers Assistance Needed: using RW most recently  ADL's / Homemaking Assistance Needed: sister  assists with bathing,dressing        Hand Dominance        Extremity/Trunk Assessment   Upper Extremity Assessment:  (pt reports numbness L UE)           Lower Extremity Assessment: Generalized weakness      Cervical / Trunk Assessment: Normal  Communication   Communication: No difficulties  Cognition Arousal/Alertness: Awake/alert Behavior During Therapy: WFL for tasks assessed/performed Overall Cognitive Status: Within Functional Limits for tasks assessed                      General Comments      Exercises        Assessment/Plan  PT Assessment Patient needs continued PT services  PT Diagnosis Difficulty walking;Acute pain;Generalized weakness   PT Problem List Decreased strength;Decreased activity tolerance;Decreased balance;Decreased mobility;Pain;Decreased knowledge of use of DME  PT Treatment Interventions DME instruction;Gait training;Functional mobility training;Therapeutic activities;Therapeutic exercise;Balance  training;Patient/family education   PT Goals (Current goals can be found in the Care Plan section) Acute Rehab PT Goals Patient Stated Goal: less pain. home at discharge PT Goal Formulation: With patient Time For Goal Achievement: 12/12/14 Potential to Achieve Goals: Fair    Frequency Min 3X/week   Barriers to discharge        Co-evaluation               End of Session   Activity Tolerance: Patient limited by pain;Patient limited by fatigue Patient left: in bed;with call bell/phone within reach           Time: 1352-1410 PT Time Calculation (min) (ACUTE ONLY): 18 min   Charges:   PT Evaluation $Initial PT Evaluation Tier I: 1 Procedure     PT G Codes:        Weston Anna, MPT Pager: [phone]

## 2014-11-28 NOTE — Progress Notes (Addendum)
Patient Demographics  Kerry Peck, is a 54 y.o. male, DOB - October 25, 1960, YIR:[id number]  Admit date - 11/26/2014   Admitting Physician Albertine Patricia, MD  Outpatient Primary MD for the patient is Lujean Amel, MD  LOS - 2   Chief Complaint  Patient presents with  . Back Pain      Admission history of present illness/brief narrative: Kerry Peck is a 54 y.o. 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 Pablo Ledger from radiation oncology as patient declined to be admitted for pain control, but he could not tolerate the pain anymore at home, so he presented to ED, there are no new neurological deficits, no urinary or stool incontinence, no loss of sensation, patient received IV Dilaudid in ED with minimal relief of pain, so hospitalist requested to admit the patient. Patient developed urinary retention, unclear if this is related to pain medication versus hoping cord compression, so thoracic spine was done, showing no significant changes from one done on 3/30, but developing urinary retention was concerning for early signs of cord compression, so patient started radiation therapy on 11/27/58.  Subjective:   Kerry Peck today has, No headache, No chest pain, No abdominal pain - No Nausea, no new weakness or tingling or numbness, reports he is constipated, reports pain is much improved.  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 - Pain is well controlled on increased dose MS Contin . - Patient developed urinary retention on admission, unclear if this is related to development of cord compression symptoms versus pain medication, but given his cord displacement, epidural tumor, he was started on radiation therapy. - Continue with Decadron, continue with Protonix as on steroids. - Follow with Dr. Earlie Server as an outpatient.  Urinary retention - Possible early signs of cord compression versus secondary to severe pain or pain medication, will attempt a voiding trial today, will start on Flomax.  Diabetes mellitus - on metformin,  on insulin sliding scale, CBGs uncontrolled, will start on Lantus.  Hypertension - Acceptable, continue with metoprolol, amlodipine,   Protein calorie malnutrition - continue with boost plus  Hyperlipidemia - Continue with statin   Code Status: Full  Family Communication: None at bedside  Disposition Plan: Home when stable   Procedures  Radiation therapy started 4/3   Consults   Radiation oncology Palliative care    Medications  Scheduled Meds: . amLODipine  10 mg Oral Daily  . antiseptic oral rinse  7  mL Mouth Rinse BID  . aspirin EC  81 mg Oral Once per day on Sun Mon Tue Thu Fri Sat  . atorvastatin  40 mg Oral Daily  . calcium-vitamin D  1 tablet Oral Q breakfast  . dexamethasone  4 mg Oral QID  . docusate sodium  100 mg Oral BID  . fenofibrate  160 mg Oral Daily  . heparin  5,000 Units Subcutaneous 3 times per day  . insulin aspart  0-15 Units Subcutaneous TID WC  . insulin glargine  10  Units Subcutaneous Daily  . metFORMIN  1,000 mg Oral BID WC  . metoprolol tartrate  25 mg Oral Daily  . mirtazapine  15 mg Oral QHS  . mometasone-formoterol  2 puff Inhalation BID  . OxyCODONE  60 mg Oral Q12H  . pantoprazole  40 mg Oral Daily  . polyethylene glycol  17 g Oral Daily  . pyridOXINE  100 mg Oral Daily  . senna  1 tablet Oral BID  . sucralfate  1 g Oral TID WC & HS  . tamsulosin  0.4 mg Oral Daily  . vitamin C  500 mg Oral Daily   Continuous Infusions: . sodium chloride 50 mL/hr at 11/28/14 0255   PRN Meds:.acetaminophen **OR** acetaminophen, albuterol, bisacodyl, HYDROmorphone (DILAUDID) injection, lactose free nutrition, ondansetron **OR** ondansetron (ZOFRAN) IV, oxyCODONE, polyethylene glycol, prochlorperazine  DVT Prophylaxis  - Heparin -   Lab Results  Component Value Date   PLT 181 11/27/2014    Antibiotics   Anti-infectives    None          Objective:   Filed Vitals:   11/27/14 1435 11/27/14 2030 11/28/14 0513 11/28/14 0748  BP: 137/90 145/87 134/63   Pulse: 73 77 79   Temp: 98.3 F (36.8 C) 98.2 F (36.8 C) 98.1 F (36.7 C)   TempSrc: Oral Oral Oral   Resp: 18 20 20    Height:      Weight:      SpO2: 99% 100% 99% 97%    Wt Readings from Last 3 Encounters:  11/26/14 89.948 kg (198 lb 4.8 oz)  11/23/14 90.81 kg (200 lb 3.2 oz)  11/23/14 90.719 kg (200 lb)     Intake/Output Summary (Last 24 hours) at 11/28/14 1254 Last data filed at 11/28/14 1012  Gross per 24 hour  Intake    840 ml  Output   2500 ml  Net  -1660 ml     Physical Exam  Awake Alert, Oriented X 3, No new F.N deficits, Normal affect, appears  more comfortable today. [place].AT,PERRAL Supple Neck,No JVD, No cervical lymphadenopathy appriciated.  Symmetrical Chest wall movement, Good air movement bilaterally, CTAB RRR,No Gallops,Rubs or new Murmurs, No Parasternal Heave +ve B.Sounds, Abd Soft, No tenderness, No organomegaly appriciated, No rebound - guarding or  rigidity. No Cyanosis, Clubbing or edema, No new Rash or bruise  , lower extremity strength 5 out of 5, upper 5 out of 5, sensation is symmetrical and intact, deep tendon reflexes intact.   Data Review   Micro Results No results found for this or any previous visit (from the past 240 hour(s)).  Radiology Reports Mr Thoracic Spine W Wo Contrast  11/26/2014   CLINICAL DATA:  Known spinal metastatic disease. Unable to bear weight. Urinary retention. Severe pain.  EXAM: MRI THORACIC SPINE WITHOUT AND WITH CONTRAST  TECHNIQUE: Multiplanar and multiecho pulse sequences of the thoracic spine were obtained without and with intravenous contrast.  CONTRAST:  60mL MULTIHANCE GADOBENATE DIMEGLUMINE  529 MG/ML IV SOLN  COMPARISON:  11/23/2014  FINDINGS: Sagittal images proceed from left to right, contrary to our normal convention.  No change is appreciated since the study of 3 days ago. There is diffuse marrow involvement by metastatic tumor throughout the region image to including the sternum, ribs and spine. At the T1-2 level, there is extraosseous tumor within the left side of the spinal canal which displaces the cord towards the right and could cause cord compressive symptoms.  At T2-3, there is a small amount of epidural tumor on the right which contacts the cord but does not appear to cause cord compression.  At T8, there is some ventral epidural tumor that contacts the ventral aspect of the cord but does not appear to cause cord compression.  At T9, there is ventral epidural tumor that contacts the ventral aspect of the cord and may deform the cord slightly on the left.  At T10, there is a small amount of ventral epidural tumor without cord compression.  Partial compression fractures are present at multiple levels, unchanged since the previous study. These include T3, T8, T9, T10, T11 and T12.  I do not identify any intra medullary metastatic disease in the region.  IMPRESSION: Please note sagittal images proceed  from left to right, contrary to our normal convention.  No change since the study of 3 days ago. There could be potential for some cord compressive symptoms based on the disease at T1-2 and T9.  Critical Value/emergent results were called by telephone at the time of interpretation on 11/26/2014 at 2:42 pm to Dr. Phillips Climes , who verbally acknowledged these results.   Electronically Signed   By: Nelson Chimes M.D.   On: 11/26/2014 14:43    CBC  Recent Labs Lab 11/23/14 1019 11/26/14 0634 11/26/14 0637 11/27/14 0455  WBC 6.5 9.5  --  6.6  HGB 10.8* 10.3* 10.5* 9.5*  HCT 32.3* 31.0* 31.0* 29.1*  PLT 189 187  --  181  MCV 93.1 93.9  --  94.8  MCH 31.1 31.2  --  30.9  MCHC 33.4 33.2  --  32.6  RDW 14.2 14.6  --  14.6  LYMPHSABS 0.7* 0.8  --   --   MONOABS 0.6 1.0  --   --   EOSABS 0.6* 0.2  --   --   BASOSABS 0.0 0.0  --   --     Chemistries   Recent Labs Lab 11/23/14 1019 11/26/14 0637 11/27/14 0455  NA 141 139 139  K 3.8 3.3* 3.6  CL  --  104 103  CO2 25  --  25  GLUCOSE 145* 179* 293*  BUN 15.4 20 15   CREATININE 0.8 0.70 0.61  CALCIUM 12.3*  --  9.2  AST 94*  --   --   ALT 28  --   --   ALKPHOS 157*  --   --   BILITOT 0.59  --   --    ------------------------------------------------------------------------------------------------------------------ estimated creatinine clearance is 125.6 mL/min (by C-G formula based on Cr of 0.61). ------------------------------------------------------------------------------------------------------------------ No results for input(s): HGBA1C in the last 72 hours. ------------------------------------------------------------------------------------------------------------------ No results for input(s): CHOL, HDL, LDLCALC, TRIG, CHOLHDL, LDLDIRECT in the last 72 hours. ------------------------------------------------------------------------------------------------------------------ No results for input(s): TSH, T4TOTAL, T3FREE,  THYROIDAB in the last 72 hours.  Invalid input(s): FREET3 ------------------------------------------------------------------------------------------------------------------ No results for input(s): VITAMINB12, FOLATE, FERRITIN, TIBC, IRON, RETICCTPCT in the last 72 hours.  Coagulation profile No results for input(s): INR, PROTIME in the  last 168 hours.  No results for input(s): DDIMER in the last 72 hours.  Cardiac Enzymes No results for input(s): CKMB, TROPONINI, MYOGLOBIN in the last 168 hours.  Invalid input(s): CK ------------------------------------------------------------------------------------------------------------------ Invalid input(s): POCBNP     Time Spent in minutes   35 minutes   ELGERGAWY, DAWOOD M.D on 11/28/2014 at 12:54 PM  Between 7am to 7pm - Pager - [phone]  After 7pm go to [email or web address] - password [email or web address]  And look for the night coverage person covering for me after hours  [facility] Office  [phone]   **Disclaimer: This note may have been dictated with voice recognition software. Similar sounding words can inadvertently be transcribed and this note may contain transcription errors which may not have been corrected upon publication of note.**

## 2014-11-28 NOTE — Progress Notes (Signed)
Inpatient Diabetes Program Recommendations  AACE/ADA: New Consensus Statement on Inpatient Glycemic Control (2013)  Target Ranges:  Prepandial:   less than 140 mg/dL      Peak postprandial:   less than 180 mg/dL (1-2 hours)      Critically ill patients:  140 - 180 mg/dL     Results for FIRMIN, BELISLE (MRN [id number]) as of 11/28/2014 09:01  Ref. Range 11/27/2014 07:34 11/27/2014 14:33 11/27/2014 21:24  Glucose-Capillary Latest Range: 70-99 mg/dL 220 (H) 292 (H) 214 (H)    Results for BOE, DEANS (MRN [id number]) as of 11/28/2014 09:01  Ref. Range 11/28/2014 07:50  Glucose-Capillary Latest Range: 70-99 mg/dL 238 (H)     Admit with: Severe Back Pain  History: DM, HTN, Stage IV Lung cancer with Mets to Bone  Home DM Meds: Metformin 1000 mg bid      Glipizide XL 5 mg daily  Current DM Orders: Metformin 1000 mg bid            Novolog Moderate SSI tid     **Patient currently receiving Decadron 4 mg QID.  **Having consistently elevated glucose levels >200 mg/dl.    MD- If within goals of care, please consider adding basal insulin to patient's in-hospital insulin regimen to help better control glucose levels-  Levemir 13 units QHS (0.15 units/kg dosing)     Will follow Wyn Quaker RN, MSN, CDE Diabetes Coordinator Inpatient Diabetes Program Team Pager: [phone] (8a-5p)

## 2014-11-29 ENCOUNTER — Ambulatory Visit (HOSPITAL_COMMUNITY): Admission: RE | Admit: 2014-11-29 | Payer: Federal, State, Local not specified - PPO | Source: Ambulatory Visit

## 2014-11-29 ENCOUNTER — Ambulatory Visit
Admit: 2014-11-29 | Discharge: 2014-11-29 | Disposition: A | Discharge status: Home / Self Care | Payer: Federal, State, Local not specified - PPO | Attending: Radiation Oncology | Admitting: Radiation Oncology

## 2014-11-29 DIAGNOSIS — C3490 Malignant neoplasm of unspecified part of unspecified bronchus or lung: Secondary | ICD-10-CM

## 2014-11-29 LAB — GLUCOSE, CAPILLARY
Glucose-Capillary: 254 mg/dL — ABNORMAL HIGH (ref 70–99)
Glucose-Capillary: 254 mg/dL — ABNORMAL HIGH (ref 70–99)
Glucose-Capillary: 193 mg/dL — ABNORMAL HIGH (ref 70–99)

## 2014-11-29 MED ORDER — OXYCODONE HCL ER 60 MG PO T12A: 60.0000 mg | EXTENDED_RELEASE_TABLET | Freq: Two times a day (BID) | ORAL | Status: DC

## 2014-11-29 MED ORDER — SENNA 8.6 MG PO TABS: 1.0000 | ORAL_TABLET | Freq: Two times a day (BID) | ORAL | Status: DC

## 2014-11-29 MED ORDER — POLYETHYLENE GLYCOL 3350 17 G PO PACK: 17.0000 g | PACK | Freq: Every day | ORAL | Status: DC

## 2014-11-29 MED ORDER — TAMSULOSIN HCL 0.4 MG PO CAPS: 0.4000 mg | ORAL_CAPSULE | Freq: Every day | ORAL | Status: DC

## 2014-11-29 MED ORDER — "PEN NEEDLES 3/16"" 31G X 5 MM MISC": Status: AC

## 2014-11-29 MED ORDER — OXYCODONE HCL 10 MG PO TABS: 10.0000 mg | ORAL_TABLET | ORAL | Status: DC | PRN

## 2014-11-29 MED ORDER — INSULIN GLARGINE 100 UNIT/ML SOLOSTAR PEN: 15.0000 [IU] | PEN_INJECTOR | Freq: Every day | SUBCUTANEOUS | Status: DC

## 2014-11-29 NOTE — Progress Notes (Signed)
Kerry Peck has completed 2 fractions to his upper and lower t spine.  He reports his pain is better.  He was able to walk with a walker yesterday with PT.  He reports feeling numbness in his left arm like it is asleep.  He reports his nausea has improved but is burping after he takes Carafate.  He reports he is urinating and has had a bowel movement yesterday and today.  He is confused on how to take his pain medication.   Reviewed potential side effects including trouble swallowing, fatigue and skin changes.  He reports he has biafine cream left from his last treatments and will use it as needed.

## 2014-11-29 NOTE — Progress Notes (Addendum)
Inpatient Diabetes Program Recommendations  AACE/ADA: New Consensus Statement on Inpatient Glycemic Control (2013)  Target Ranges:  Prepandial:   less than 140 mg/dL      Peak postprandial:   less than 180 mg/dL (1-2 hours)      Critically ill patients:  140 - 180 mg/dL     Results for EURA, RADABAUGH (MRN [id number]) as of 11/29/2014 08:41  Ref. Range 11/28/2014 07:50 11/28/2014 12:20 11/28/2014 18:11 11/28/2014 21:21  Glucose-Capillary Latest Range: 70-99 mg/dL 238 (H) 305 (H) 209 (H) 264 (H)    Results for BECKHAM, CAPISTRAN (MRN [id number]) as of 11/29/2014 08:41  Ref. Range 11/29/2014 08:02  Glucose-Capillary Latest Range: 70-99 mg/dL 254 (H)      Admit with: Severe Back Pain  History: DM, HTN, Stage IV Lung cancer with Mets to Bone  Home DM Meds: Metformin 1000 mg bid  Glipizide XL 5 mg daily  Current DM Orders: Metformin 1000 mg bid  Novolog Moderate SSI tid            Lantus 10 units daily (started yesterday AM)    **Patient currently receiving Decadron 4 mg QID.  **Having consistently elevated glucose levels >200 mg/dl.  **Note Lantus 10 units daily started yesterday AM (04/04).    MD- Please consider the following in-hospital insulin adjustments:  1. Increase Lantus to 15 units daily  2. Add Novolog Meal Coverage- Novolog 3 units tid with meals (patient is eating 75-100% of meals)  3. If patient to go home on insulin, patient would prefer insulin pens- Please give patient Rxs for Insulin pens and Insulin pen needles.   Can use the following order numbers:  Lantus Solostar Insulin Pen: Order # [id number] Insulin Pen needles 31 gauge x 5 mm: Order # [id number]     Addendum 1030AM: Educated patient on insulin pen use at home.  Reviewed contents of insulin flexpen starter kit.  Reviewed all steps if insulin pen including attachment of needle, 2-unit air shot, dialing up dose, giving injection, removing needle, disposal  of sharps, storage of unused insulin, disposal of insulin etc.  Patient able to provide successful return demonstration.  Also reviewed troubleshooting with insulin pen.  MD to give patient Rxs for insulin pens and insulin pen needles.      Will follow Wyn Quaker RN, MSN, CDE Diabetes Coordinator Inpatient Diabetes Program Team Pager: [phone] (8a-5p)

## 2014-11-29 NOTE — Progress Notes (Signed)
Patient GE:XBMWU Markale Birdsell      DOB: 03/11/1961      XLK:[id number]   Palliative Medicine Team at [facility] Progress Note    Subjective: Pain well controlled. Needed some IV dilaudid for pain related to laying on XRT table. Moving bowels. Still with urinary retention. No other acute complaints. Eating well, No N/V   Filed Vitals:   11/29/14 1411  BP: 131/98  Pulse: 88  Temp: 98.4 F (36.9 C)  Resp: 20   Physical exam: GEN: alert, NAD CV: RRR LUNGS: CTAB ABD: soft, NT ND EXT: no edema Skin: warm/dry   CBC    Component Value Date/Time   WBC 6.6 11/27/2014 0455   WBC 6.5 11/23/2014 1019   WBC 16.7* 03/27/2014 1200   RBC 3.07* 11/27/2014 0455   RBC 3.47* 11/23/2014 1019   RBC 4.03* 03/27/2014 1200   HGB 9.5* 11/27/2014 0455   HGB 10.8* 11/23/2014 1019   HGB 12.8* 03/27/2014 1200   HCT 29.1* 11/27/2014 0455   HCT 32.3* 11/23/2014 1019   HCT 38.8* 03/27/2014 1200   PLT 181 11/27/2014 0455   PLT 189 11/23/2014 1019   MCV 94.8 11/27/2014 0455   MCV 93.1 11/23/2014 1019   MCV 96.4 03/27/2014 1200   MCH 30.9 11/27/2014 0455   MCH 31.1 11/23/2014 1019   MCH 31.7* 03/27/2014 1200   MCHC 32.6 11/27/2014 0455   MCHC 33.4 11/23/2014 1019   MCHC 32.9 03/27/2014 1200   RDW 14.6 11/27/2014 0455   RDW 14.2 11/23/2014 1019   LYMPHSABS 0.8 11/26/2014 0634   LYMPHSABS 0.7* 11/23/2014 1019   MONOABS 1.0 11/26/2014 0634   MONOABS 0.6 11/23/2014 1019   EOSABS 0.2 11/26/2014 0634   EOSABS 0.6* 11/23/2014 1019   BASOSABS 0.0 11/26/2014 0634   BASOSABS 0.0 11/23/2014 1019    CMP     Component Value Date/Time   NA 139 11/27/2014 0455   NA 141 11/23/2014 1019   K 3.6 11/27/2014 0455   K 3.8 11/23/2014 1019   CL 103 11/27/2014 0455   CO2 25 11/27/2014 0455   CO2 25 11/23/2014 1019   GLUCOSE 293* 11/27/2014 0455   GLUCOSE 145* 11/23/2014 1019   BUN 15 11/27/2014 0455   BUN 15.4 11/23/2014 1019   CREATININE 0.61 11/27/2014 0455   CREATININE 0.8 11/23/2014 1019   CALCIUM 9.2 11/27/2014 0455   CALCIUM 12.3* 11/23/2014 1019   PROT 7.8 11/23/2014 1019   PROT 6.4 04/05/2014 1338   ALBUMIN 3.2* 11/23/2014 1019   ALBUMIN 4.0 04/05/2014 1338   AST 94* 11/23/2014 1019   AST 13 04/05/2014 1338   ALT 28 11/23/2014 1019   ALT 17 04/05/2014 1338   ALKPHOS 157* 11/23/2014 1019   ALKPHOS 139* 04/05/2014 1338   BILITOT 0.59 11/23/2014 1019   BILITOT 0.5 04/05/2014 1338   GFRNONAA >90 11/27/2014 0455   GFRAA >90 11/27/2014 0455    Assessment and plan: 54 yo male with Extensive Stage SCLC complicated by liver, bone/spine mets. Admitted with cancer related pain  1. Code Status: Full, I did not discuss  2. GOC: Had longer discussion today about how he is coping with his illness.  He states "im scared as hell, i don't know what dying will be like".  He embraces the "fight" mentality against cancer and is hopeful that continued treatment plans will put this off to the distant future. At the same time he is somewhat pragmatic having moved in with his sister and pointing to getting his affairs  in order with her help.  We also talked through discharge plan and I attempted to answer some questions for him regarding things like insulin injections (which will be new for him), home health with catheter, etc.    His goals will need ongoing discussion in future. I would ask that if/when he is re-admitted that we be consulted at time of admission. Unfortunately, we do not have outpatient services through our department at this time.    3. Symptom Management:  1. Cancer Related Pain: Will need prescriptions for new dose of oxycontin 60mg  BID and oxycodone 15mg  PRN 2. Loss of Appetite: On steroids. Remeron. Doing much better 3. Insomnia: mirtazapine.  4. Constipation- resolved. Cont current regimen  4. Psychosocial/Spiritual: Lives at home with support from his sister who has been staying with him. Retired [employer] manager  Total Time: 40 minutes >50% of time  spent in counseling and coordination of care regarding above.  Doran Clay D.O. Palliative Medicine Team at [facility]  Pager: [phone] Team Phone: [phone]

## 2014-11-29 NOTE — Care Management Note (Addendum)
CARE MANAGEMENT NOTE 11/29/2014  Patient:  Kerry Peck, Kerry Peck   Account Number:  [id number]  Date Initiated:  11/29/2014  Documentation initiated by:  Kerry Peck  Subjective/Objective Assessment:   54 yo admitted with cancer associated pain.     Action/Plan:   From home alone   Anticipated DC Date:  11/29/2014   Anticipated DC Plan:  [place]  CM consult      [facility] Choice  HOME HEALTH   Choice offered to / List presented to:  C-1 Patient        [facility] arranged  HH-1 RN  HH-2 PT  HH-3 OT  HH-4 NURSE'S AIDE  HH-6 SOCIAL WORKER      Status of service:  Completed, signed off Medicare Important Message given?   (If response is "NO", the following Medicare IM given date fields will be blank) Date Medicare IM given:   Medicare IM given by:   Date Additional Medicare IM given:   Additional Medicare IM given by:    Discharge Disposition:    Per UR Regulation:  Reviewed for med. necessity/level of care/duration of stay  If discussed at [place] of Stay Meetings, dates discussed:    Comments: 11/29/2014 Kerry Peck RNCM 1853pm EDCM received phone call from Kerry Peck is at bedside and that [facility] home health has been chosen for services.  EDCM informed RN to inform patient that Kerry Peck will have 24-48 hours to contact the patient.  [facility] faxed home health orders with face to face to [facility] at 2101pm with confirmation of receipt at 2103pm.  No further [facility] needs at this time.    11/29/14 [facility] in with staff RN who states that pts Peck has still not arrived. Staff RN checked in with pt about [facility] decision. Pt states he will speak to CM once his Peck is here. Staff RN given evening ED CM number to call when pt Peck arrives to set up [facility] services.  11/29/14 Kerry Bay RN,BSN,NCM DC order written by Kerry Peck and Kerry Peck orders as well.  This CM asked pt if he  would like me to call his Peck and speak to her about the [place]. Pt states that no he would not like me to call his Peck that she would be up to the hospital later to learn how to give him his insulin. He states that I was "rushing him on making a decision".  11/29/14 1300 Kerry Doctor RN,BSN,NCM [phone] Kerry Peck asked CM to see what pt would have to pay as a copay for a lantus pen at DC. Benefits check done and [employer] copay for lantus pen is 50$. This information was shared with pt. PT recommendations are for HHPT and 24hr supervision. Pt states that his Peck and her wife live right around the corner from him and work different shifts so they can stay with him for awhile. Pt given [facility] provider list and offered choice for [facility] services. Pt states that he does not want to make a decision at this time and would like to speak with his Peck.

## 2014-11-29 NOTE — Discharge Summary (Signed)
Kerry Peck, 54 y.o., DOB 09/15/1960, MRN [id number]. Admission date: 11/26/2014 Discharge Date 11/29/2014 Primary MD Lujean Amel, MD Admitting Physician Albertine Patricia, MD   PCP please follow-up on: - Agent developed urinary retention, failed voiding trial, started on Flomax, please attempt voiding trial as an outpatient, be discharged with Foley catheter. - Will need to continue radiation treatment, has a mother 8 sessions left. - We need to follow with oncology service after radiation treatment finished. - Check regular labs pending CBC, BMP during next visit. - was started on Lantus on discharge.  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 Diabetes mellitus without complication   . Hypertension   . Smoking addiction   . Cancer     lung and liver  . Radiation 08/16/14-09/06/14    prophylatic cranial radiation 25 gray, chest, t-spine  35 gray  . Radiation 09/29/14-10/12/14    left hilar area 30 gray, right pelvis 30 gray, right lower rib cage area 30 gray  . 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 No pertinent past surgical history.   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 Pablo Ledger from radiation oncology as patient declined to be admitted for pain control, but he could not tolerate the pain anymore at home, so he presented to ED, there are no new neurological deficits, no urinary or stool incontinence, no loss of sensation, patient received IV Dilaudid in ED with minimal relief of pain, so hospitalist requested to admit the patient. Patient developed urinary retention, unclear if this is related to pain medication versus hoping cord compression, so thoracic spine was done, showing no significant changes from one done on 3/30, but developing urinary retention was concerning for early signs of cord compression, so patient started radiation therapy on 11/27/58.  Uncontrolled cancer pain due to metastasis in thoracic spine/ stage IV small cell lung cancer - Palliative care consult appreciated regarding pain medication management, pain initially uncontrolled as he ran out of his MS Contin. - Pain is well controlled on increased dose MS Contin 60 mg twice a day, on when necessary oxycodone 10-15 mg every 4 hours, shunt was given prescription. - Patient developed urinary retention on admission, unclear  if this is related to development of cord compression symptoms versus pain medication, but given his cord displacement, epidural tumor, he was started on radiation therapy. - Continue with Decadron, continue with Protonix as on steroids. - Follow with Dr. Earlie Server as an  outpatient.  Urinary retention - Possible early signs of cord compression versus secondary to severe pain or pain medication, failed voiding trial, discharged with Foley catheter, started on Flomax, please attempt voiding trial as an outpatient.  Diabetes mellitus - CBGs with poorly controlled, so was started on Lantus, educated about Lantus pen use, given prescription, continue with metformin.  Hypertension - Acceptable, continue with metoprolol, amlodipine,   Protein calorie malnutrition - continue with boost plus  Hyperlipidemia - Continue with statin  Consults   Radiation oncology  Significant Tests:  See full reports for all details     Ct Chest W Contrast  11/16/2014   CLINICAL DATA:  Lung cancer restaging. Left-sided chest pain for 1 week. Diagnosed in 2015. Chemotherapy and radiation therapy complete. Small cell type. Diabetes. Hypertension.  EXAM: CT CHEST, ABDOMEN, AND PELVIS WITH CONTRAST  TECHNIQUE: Multidetector CT imaging of the chest, abdomen and pelvis was performed following the standard protocol during bolus administration of intravenous contrast.  CONTRAST:  179mL OMNIPAQUE IOHEXOL 300 MG/ML  SOLN  COMPARISON:  09/09/2014  FINDINGS: CT CHEST FINDINGS  Mediastinum/Nodes: No supraclavicular adenopathy. Extensive aortic atherosclerosis. Normal heart size, without pericardial effusion. Multivessel coronary artery atherosclerosis. No central pulmonary embolism, on this non-dedicated study. No mediastinal adenopathy. Decrease in left hilar adenopathy. 1.4 x 1.4 cm node on image 31 is decreased from 2.5 x 2.2 cm on the prior. No right hilar adenopathy.  Lungs/Pleura: Trace left pleural fluid is new. Secretions along the trachea. Moderate centrilobular emphysema.  Left lower lobe spiculated pulmonary nodule measures 1.8 x 1.6 cm on image 37 and is felt to be similar to 2.2 x 1.4 cm on the prior.  Minimal nodularity along the left major fissure on image 34 is new. Nonspecific.   Musculoskeletal: Progressive osseous metastasis. Posterior vertebral body tumor at T4 and T10 encroach upon the ventral canal. Example sagittal image 61. Axial images 43 and 67.  CT ABDOMEN PELVIS FINDINGS  Hepatobiliary: Development of hepatic metastasis. Example right hepatic lobe 2.1 cm lesion on image 64.  Lateral segment left liver lobe 2.3 cm lesion on image 53. Resolved hepatic steatosis.  Cholelithiasis without acute cholecystitis or biliary ductal dilatation.  Pancreas: Hypo attenuating lesion in the pancreatic tail measures 11 mm on image 65 and may have increased minimally from the prior exam. Suspicious for a metastasis.  Spleen: Normal  Adrenals/Urinary Tract: Normal adrenal glands. Too small to characterize interpolar left renal lesion. Normal right kidney, without hydronephrosis. Possible bladder wall thickening, mild.  Stomach/Bowel: Normal stomach, without wall thickening. Normal colon, appendix, and terminal ileum. Normal small bowel.  Vascular/Lymphatic: Advanced aortic and branch vessel atherosclerosis. Developing retroperitoneal adenopathy, including a left periaortic 1.4 cm node on image 75. Likely necrotic portacaval node at 1.7 cm on image 66. No pelvic adenopathy.  Reproductive: Normal prostate.  Other: No significant free fluid. No evidence of omental or peritoneal disease. Tiny fat containing bilateral inguinal hernias.  Musculoskeletal: Widespread osseous metastasis. Progressive. Right iliac lesion is enlarged with cortical destruction/pathologic fracture. Ill-defined lytic metastasis throughout the sacrum is progressive. Soft tissue component extends anterior to the left side of the sacrum including on image 103. Markedly progressive thoracolumbar vertebral metastatic disease. Tumor involvement involving the posterior aspect of L2 extends  minimally into the ventral canal. Multiple compression deformities. Similar.  IMPRESSION: CT CHEST IMPRESSION  1. Similar left lower lobe pulmonary  nodule with decrease in left hilar adenopathy. 2. Progressive osseous metastasis. Tumor canal encroachment involving the thoracic spine. Consider further evaluation with pre and post contrast thoracic spine MRI. 3.  Atherosclerosis, including within the coronary arteries. 4. New trace left pleural fluid.  CT ABDOMEN AND PELVIS IMPRESSION  1. Development of hepatic metastasis. 2. Developing nodal metastasis within the upper abdomen. 3. Progressive osseous metastasis. Lumbar vertebral canal encroachment would also be better evaluated with pre and post contrast lumbar spine MRI. 4. Hypo attenuating pancreatic lesion is suspicious for metastasis. Similar to minimally progressive. 5. Cholelithiasis. 6. Possible mild bladder wall thickening. Correlate with symptoms to suggest cystitis or bladder outlet obstruction.   Electronically Signed   By: Abigail Miyamoto M.D.   On: 11/16/2014 10:56   Mr Thoracic Spine W Wo Contrast  11/26/2014   CLINICAL DATA:  Known spinal metastatic disease. Unable to bear weight. Urinary retention. Severe pain.  EXAM: MRI THORACIC SPINE WITHOUT AND WITH CONTRAST  TECHNIQUE: Multiplanar and multiecho pulse sequences of the thoracic spine were obtained without and with intravenous contrast.  CONTRAST:  57mL MULTIHANCE GADOBENATE DIMEGLUMINE 529 MG/ML IV SOLN  COMPARISON:  11/23/2014  FINDINGS: Sagittal images proceed from left to right, contrary to our normal convention.  No change is appreciated since the study of 3 days ago. There is diffuse marrow involvement by metastatic tumor throughout the region image to including the sternum, ribs and spine. At the T1-2 level, there is extraosseous tumor within the left side of the spinal canal which displaces the cord towards the right and could cause cord compressive symptoms.  At T2-3, there is a small amount of epidural tumor on the right which contacts the cord but does not appear to cause cord compression.  At T8, there is some ventral epidural tumor  that contacts the ventral aspect of the cord but does not appear to cause cord compression.  At T9, there is ventral epidural tumor that contacts the ventral aspect of the cord and may deform the cord slightly on the left.  At T10, there is a small amount of ventral epidural tumor without cord compression.  Partial compression fractures are present at multiple levels, unchanged since the previous study. These include T3, T8, T9, T10, T11 and T12.  I do not identify any intra medullary metastatic disease in the region.  IMPRESSION: Please note sagittal images proceed from left to right, contrary to our normal convention.  No change since the study of 3 days ago. There could be potential for some cord compressive symptoms based on the disease at T1-2 and T9.  Critical Value/emergent results were called by telephone at the time of interpretation on 11/26/2014 at 2:42 pm to Dr. Phillips Climes , who verbally acknowledged these results.   Electronically Signed   By: Nelson Chimes M.D.   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 Left-sided chest pain. Spinal canal encroachment by tumor on CT scan of the chest dated 11/16/2014  EXAM: MRI THORACIC SPINE WITHOUT AND WITH CONTRAST  TECHNIQUE: Multiplanar and multiecho pulse sequences of the thoracic spine were obtained without and with intravenous contrast.  CONTRAST:  47mL MULTIHANCE GADOBENATE DIMEGLUMINE 529 MG/ML IV SOLN  COMPARISON:  CT scan dated 11/16/2014  FINDINGS: Tumor infiltrates every thoracic  vertebra except T7. Epidural tumor extends into the the left side of the spinal canal at T1-2 and deviates the spinal cord to the right. Tumor extends into the left neural foramina at T1-2 and T2-3. There is extensive tumor in the posterior elements of T3 and T4. There pathologic compression fractures of T3, T8, T9, and T11 and T12.  The posterior margins of T3 and T8 and T9 protrude into  the spinal canal. Tumor encroaches on the left neural foramen at T9-10.  Note is made of numerous metastatic lesions in the liver.  IMPRESSION: 1. Diffuse metastatic disease throughout the entire thoracic spine with the exception of sparing of the T7 vertebra. 2. Extensive epidural tumor on the left at T2-3 which deviates spinal cord to the right. 3. Multiple pathologic fractures with slight protrusions of the posterior aspects of T3, T8 and T9 into the spinal canal with extension into the left neural foramen at T9-10.   Electronically Signed   By: Lorriane Shire M.D.   On: 11/24/2014 08:25   Ct Abdomen Pelvis W Contrast  11/16/2014   CLINICAL DATA:  Lung cancer restaging. Left-sided chest pain for 1 week. Diagnosed in 2015. Chemotherapy and radiation therapy complete. Small cell type. Diabetes. Hypertension.  EXAM: CT CHEST, ABDOMEN, AND PELVIS WITH CONTRAST  TECHNIQUE: Multidetector CT imaging of the chest, abdomen and pelvis was performed following the standard protocol during bolus administration of intravenous contrast.  CONTRAST:  124mL OMNIPAQUE IOHEXOL 300 MG/ML  SOLN  COMPARISON:  09/09/2014  FINDINGS: CT CHEST FINDINGS  Mediastinum/Nodes: No supraclavicular adenopathy. Extensive aortic atherosclerosis. Normal heart size, without pericardial effusion. Multivessel coronary artery atherosclerosis. No central pulmonary embolism, on this non-dedicated study. No mediastinal adenopathy. Decrease in left hilar adenopathy. 1.4 x 1.4 cm node on image 31 is decreased from 2.5 x 2.2 cm on the prior. No right hilar adenopathy.  Lungs/Pleura: Trace left pleural fluid is new. Secretions along the trachea. Moderate centrilobular emphysema.  Left lower lobe spiculated pulmonary nodule measures 1.8 x 1.6 cm on image 37 and is felt to be similar to 2.2 x 1.4 cm on the prior.  Minimal nodularity along the left major fissure on image 34 is new. Nonspecific.  Musculoskeletal: Progressive osseous metastasis. Posterior  vertebral body tumor at T4 and T10 encroach upon the ventral canal. Example sagittal image 61. Axial images 43 and 67.  CT ABDOMEN PELVIS FINDINGS  Hepatobiliary: Development of hepatic metastasis. Example right hepatic lobe 2.1 cm lesion on image 64.  Lateral segment left liver lobe 2.3 cm lesion on image 53. Resolved hepatic steatosis.  Cholelithiasis without acute cholecystitis or biliary ductal dilatation.  Pancreas: Hypo attenuating lesion in the pancreatic tail measures 11 mm on image 65 and may have increased minimally from the prior exam. Suspicious for a metastasis.  Spleen: Normal  Adrenals/Urinary Tract: Normal adrenal glands. Too small to characterize interpolar left renal lesion. Normal right kidney, without hydronephrosis. Possible bladder wall thickening, mild.  Stomach/Bowel: Normal stomach, without wall thickening. Normal colon, appendix, and terminal ileum. Normal small bowel.  Vascular/Lymphatic: Advanced aortic and branch vessel atherosclerosis. Developing retroperitoneal adenopathy, including a left periaortic 1.4 cm node on image 75. Likely necrotic portacaval node at 1.7 cm on image 66. No pelvic adenopathy.  Reproductive: Normal prostate.  Other: No significant free fluid. No evidence of omental or peritoneal disease. Tiny fat containing bilateral inguinal hernias.  Musculoskeletal: Widespread osseous metastasis. Progressive. Right iliac lesion is enlarged with cortical destruction/pathologic fracture. Ill-defined lytic metastasis throughout the sacrum  is progressive. Soft tissue component extends anterior to the left side of the sacrum including on image 103. Markedly progressive thoracolumbar vertebral metastatic disease. Tumor involvement involving the posterior aspect of L2 extends minimally into the ventral canal. Multiple compression deformities. Similar.  IMPRESSION: CT CHEST IMPRESSION  1. Similar left lower lobe pulmonary nodule with decrease in left hilar adenopathy. 2. Progressive  osseous metastasis. Tumor canal encroachment involving the thoracic spine. Consider further evaluation with pre and post contrast thoracic spine MRI. 3.  Atherosclerosis, including within the coronary arteries. 4. New trace left pleural fluid.  CT ABDOMEN AND PELVIS IMPRESSION  1. Development of hepatic metastasis. 2. Developing nodal metastasis within the upper abdomen. 3. Progressive osseous metastasis. Lumbar vertebral canal encroachment would also be better evaluated with pre and post contrast lumbar spine MRI. 4. Hypo attenuating pancreatic lesion is suspicious for metastasis. Similar to minimally progressive. 5. Cholelithiasis. 6. Possible mild bladder wall thickening. Correlate with symptoms to suggest cystitis or bladder outlet obstruction.   Electronically Signed   By: Abigail Miyamoto M.D.   On: 11/16/2014 10:56     Today   Subjective:   Kerry Peck today has no headache,no chest abdominal pain,no new weakness tingling or numbness, feels much better today, failed another voiding trial, Foley catheter was reinserted.  Objective:   Blood pressure 131/98, pulse 88, temperature 98.4 F (36.9 C), temperature source Oral, resp. rate 20, height 6\' 2"  (1.88 m), weight 90.6 kg (199 lb 11.8 oz), SpO2 97 %.  Intake/Output Summary (Last 24 hours) at 11/29/14 1752 Last data filed at 11/29/14 1740  Gross per 24 hour  Intake   1457 ml  Output   1600 ml  Net   -143 ml    Exam Awake Alert, Oriented *3, No new F.N deficits, Normal affect [place].AT,PERRAL Supple Neck,No JVD, No cervical lymphadenopathy appriciated.  Symmetrical Chest wall movement, Good air movement bilaterally, CTAB RRR,No Gallops,Rubs or new Murmurs, No Parasternal Heave +ve B.Sounds, Abd Soft, Non tender, No organomegaly appriciated, No rebound -guarding or rigidity. No Cyanosis, Clubbing or edema, No new Rash or bruise, motor strength 5 out of 5 in all extremities, no focal deficits.  Data Review   Cultures -   CBC w Diff:  Lab Results  Component Value Date   WBC 6.6 11/27/2014   WBC 6.5 11/23/2014   WBC 16.7* 03/27/2014   HGB 9.5* 11/27/2014   HGB 10.8* 11/23/2014   HGB 12.8* 03/27/2014   HCT 29.1* 11/27/2014   HCT 32.3* 11/23/2014   HCT 38.8* 03/27/2014   PLT 181 11/27/2014   PLT 189 11/23/2014   LYMPHOPCT 8* 11/26/2014   LYMPHOPCT 11.2* 11/23/2014   MONOPCT 10 11/26/2014   MONOPCT 8.4 11/23/2014   EOSPCT 2 11/26/2014   EOSPCT 9.8* 11/23/2014   BASOPCT 0 11/26/2014   BASOPCT 0.3 11/23/2014   CMP: Lab Results  Component Value Date   NA 139 11/27/2014   NA 141 11/23/2014   K 3.6 11/27/2014   K 3.8 11/23/2014   CL 103 11/27/2014   CO2 25 11/27/2014   CO2 25 11/23/2014   BUN 15 11/27/2014   BUN 15.4 11/23/2014   CREATININE 0.61 11/27/2014   CREATININE 0.8 11/23/2014   PROT 7.8 11/23/2014   PROT 6.4 04/05/2014   ALBUMIN 3.2* 11/23/2014   ALBUMIN 4.0 04/05/2014   BILITOT 0.59 11/23/2014   BILITOT 0.5 04/05/2014   ALKPHOS 157* 11/23/2014   ALKPHOS 139* 04/05/2014   AST 94* 11/23/2014   AST 13 04/05/2014  ALT 28 11/23/2014   ALT 17 04/05/2014  .  Micro Results No results found for this or any previous visit (from the past 240 hour(s)).   Discharge Instructions      Follow-up Information    Follow up with Lujean Amel, MD. Schedule an appointment as soon as possible for a visit in 1 week.   Specialty:  Family Medicine   Why:  Posthospitalization for uncontrolled pain   Contact information:   [address] [place] [address] [phone]       Follow up with [name], MD. Call in 3 weeks.   Specialty:  Oncology   Why:  Follow-up on lung cancer   Contact information:   [place] [address] [phone]       Follow up with [facility].   Specialty:  Radiation Oncology   Why:  Please be there on 4/6 at 4:30 PM as you're scheduled to have radiation treatment at 4:45 PM   Contact  information:   [address] [id number] [place] [phone]      Discharge Medications     Medication List    STOP taking these medications        glipiZIDE 5 MG 24 hr tablet  Commonly known as:  GLUCOTROL XL     naproxen sodium 220 MG tablet  Commonly known as:  ANAPROX     oxyCODONE-acetaminophen 10-325 MG per tablet  Commonly known as:  PERCOCET      TAKE these medications        5 SERIES BP MONITOR Devi     albuterol 108 (90 BASE) MCG/ACT inhaler  Commonly known as:  PROVENTIL HFA;VENTOLIN HFA  Inhale 2 puffs into the lungs every 6 (six) hours as needed for wheezing.     amLODipine 10 MG tablet  Commonly known as:  NORVASC  Take 10 mg by mouth daily.     aspirin EC 81 MG tablet  Take 81 mg by mouth as directed. Take 1 tablet all days except Wednesday     atorvastatin 40 MG tablet  Commonly known as:  LIPITOR  Take 40 mg by mouth daily.     calcium-vitamin D 500-200 MG-UNIT per tablet  Commonly known as:  OSCAL WITH D  Take 1 tablet by mouth daily with breakfast.     CENTRUM SILVER ADULT 50+ PO  Take 1 tablet by mouth daily.     dexamethasone 4 MG tablet  Commonly known as:  DECADRON  Take 1 tablet (4 mg total) by mouth 4 (four) times daily.     emollient cream  Commonly known as:  BIAFINE  Apply topically as needed.     esomeprazole 20 MG capsule  Commonly known as:  NEXIUM  Take 20 mg by mouth daily as needed.     fenofibrate 160 MG tablet  Take 160 mg by mouth daily.     Fluticasone-Salmeterol 250-50 MCG/DOSE Aepb  Commonly known as:  ADVAIR  Inhale 1 puff into the lungs every 12 (twelve) hours.     Insulin Glargine 100 UNIT/ML Solostar Pen  Commonly known as:  LANTUS  Inject 15 Units into the skin daily.     metFORMIN 500 MG tablet  Commonly known as:  GLUCOPHAGE  Take 1,000 mg by mouth 2 (two) times daily with a meal.     metoprolol tartrate 25 MG tablet  Commonly known as:  LOPRESSOR  Take 25  mg by mouth daily.     ondansetron 8 MG tablet  Commonly known as:  ZOFRAN  Take 8 mg by mouth every 8 (eight) hours as needed for nausea or vomiting. Disp. 20 tablets. 1 refill.  Called in to [facility] per Dr. Sondra Come on 11/24/14.     ONE TOUCH ULTRA TEST test strip  Generic drug:  glucose blood     OxyCODONE HCl ER 60 MG T12a  Commonly known as:  OXYCONTIN  Take 60 mg by mouth every 12 (twelve) hours.     Oxycodone HCl 10 MG Tabs  Take 1-1.5 tablets (10-15 mg total) by mouth every 4 (four) hours as needed for moderate pain or severe pain.     pantoprazole 40 MG tablet  Commonly known as:  PROTONIX  Take 40 mg by mouth daily.     Pen Needles 3/16" 31G X 5 MM Misc  Use daily with your Lantus injection     polyethylene glycol packet  Commonly known as:  MIRALAX / GLYCOLAX  Take 17 g by mouth daily.     prochlorperazine 10 MG tablet  Commonly known as:  COMPAZINE  Take 1 tablet (10 mg total) by mouth every 6 (six) hours as needed for nausea or vomiting.     pyridOXINE 100 MG tablet  Commonly known as:  VITAMIN B-6  Take 100 mg by mouth daily.     senna 8.6 MG Tabs tablet  Commonly known as:  SENOKOT  Take 1 tablet (8.6 mg total) by mouth 2 (two) times daily.     sucralfate 1 GM/10ML suspension  Commonly known as:  CARAFATE  Take 10 mLs (1 g total) by mouth 4 (four) times daily -  with meals and at bedtime.     tamsulosin 0.4 MG Caps capsule  Commonly known as:  FLOMAX  Take 1 capsule (0.4 mg total) by mouth daily.     vitamin C 500 MG tablet  Commonly known as:  ASCORBIC ACID  Take 500 mg by mouth daily.         Total Time in preparing paper work, data evaluation and todays exam - 35 minutes  Kerry Peck M.D on 11/29/2014 at 5:52 PM  [place]  [phone]

## 2014-11-29 NOTE — Progress Notes (Signed)
Pt given copy of AVS and was educated with family member present.  Patient and family member verbalized understanding all information included in documents.  Patient also replace large foley collection system with leg bag and given leg bag as spare to take home.  Patient selection of home health agency [facility] passed on to ED case manager for completion.  Lantus Pen Instructional video watched with patient and family member.  Patient family member verbalized having history of assisting other family members with the pen.  Patient discharge home via wc to car.

## 2014-11-29 NOTE — Discharge Instructions (Signed)
Follow with Primary MD Kerry Amel, MD in 7 days   Get CBC, CMP, 2 view Chest X ray checked  by Primary MD next visit.    Activity: As tolerated with Full fall precautions use walker/cane & assistance as needed   Disposition Home    Diet: Heart Healthy , carbohydrate modified , with feeding assistance and aspiration precautions as needed.  For Heart failure patients - Check your Weight same time everyday, if you gain over 2 pounds, or you develop in leg swelling, experience more shortness of breath or chest pain, call your Primary MD immediately. Follow Cardiac Low Salt Diet and 1.5 lit/day fluid restriction.   On your next visit with your primary care physician please Get Medicines reviewed and adjusted.   Please request your Prim.MD to go over all Hospital Tests and Procedure/Radiological results at the follow up, please get all Hospital records sent to your Prim MD by signing hospital release before you go home.   If you experience worsening of your admission symptoms, develop shortness of breath, life threatening emergency, suicidal or homicidal thoughts you must seek medical attention immediately by calling 911 or calling your MD immediately  if symptoms less severe.  You Must read complete instructions/literature along with all the possible adverse reactions/side effects for all the Medicines you take and that have been prescribed to you. Take any new Medicines after you have completely understood and accpet all the possible adverse reactions/side effects.   Do not drive, operating heavy machinery, perform activities at heights, swimming or participation in water activities or provide baby sitting services if your were admitted for syncope or siezures until you have seen by Primary MD or a Neurologist and advised to do so again.  Do not drive when taking Pain medications.    Do not take more than prescribed Pain, Sleep and Anxiety Medications  Special Instructions: If you  have smoked or chewed Tobacco  in the last 2 yrs please stop smoking, stop any regular Alcohol  and or any Recreational drug use.  Wear Seat belts while driving.   Please note  You were cared for by a hospitalist during your hospital stay. If you have any questions about your discharge medications or the care you received while you were in the hospital after you are discharged, you can call the unit and asked to speak with the hospitalist on call if the hospitalist that took care of you is not available. Once you are discharged, your primary care physician will handle any further medical issues. Please note that NO REFILLS for any discharge medications will be authorized once you are discharged, as it is imperative that you return to your primary care physician (or establish a relationship with a primary care physician if you do not have one) for your aftercare needs so that they can reassess your need for medications and monitor your lab values.

## 2014-11-29 NOTE — Progress Notes (Signed)
  Radiation Oncology         [phone] ________________________________  Name: Kerry Peck MRN: [id number]  Date: 11/29/2014  DOB: 1961/02/13  Weekly Radiation Therapy Management - inpatient  Diagnosis: Metastatic small cell lung cancer  Current Dose: 5.0 Gy     Planned Dose:  25 Gy  Narrative . . . . . . . . The patient presents for routine under treatment assessment.                                   He continues to be admitted to the hospital but may be going home later today or tomorrow. The patient did have his Foley catheter removed and is been able to urinate on his own. Patient has been able to ambulate with the assistance of staff.                                 Set-up films were reviewed.                                 The chart was checked. Physical Findings. . . Alert and oriented lying comfortably in his hospital bed. lower motor strength is 5 out of 5 in the proximal and distal muscle groups of the lower extremities. Impression . . . . . . . The patient is tolerating radiation. Plan . . . . . . . . . . . . Continue treatment as planned.  ________________________________   Blair Promise, PhD, MD

## 2014-11-30 ENCOUNTER — Ambulatory Visit: Payer: Federal, State, Local not specified - PPO | Admitting: Radiation Oncology

## 2014-11-30 ENCOUNTER — Ambulatory Visit
Admit: 2014-11-30 | Discharge: 2014-11-30 | Disposition: A | Discharge status: Home / Self Care | Payer: Federal, State, Local not specified - PPO | Attending: Radiation Oncology | Admitting: Radiation Oncology

## 2014-11-30 DIAGNOSIS — C7951 Secondary malignant neoplasm of bone: Secondary | ICD-10-CM | POA: Diagnosis not present

## 2014-11-30 DIAGNOSIS — Z87891 Personal history of nicotine dependence: Secondary | ICD-10-CM | POA: Diagnosis not present

## 2014-11-30 DIAGNOSIS — C3490 Malignant neoplasm of unspecified part of unspecified bronchus or lung: Secondary | ICD-10-CM | POA: Diagnosis present

## 2014-11-30 NOTE — Progress Notes (Signed)
[facility] received phone call from [place] [facility] home health reporting that nursing would not be available to see the patient until Saturday.  EDCM called patient at [phone] and left voice mail with phone number for call back.  [facility] called phone number left by [facility], which was a fax machine.  EDCM called [facility] at [phone] an was disconnected from the call.  No further EDCM needs at this time.

## 2014-12-01 ENCOUNTER — Encounter (HOSPITAL_COMMUNITY): Payer: Self-pay | Admitting: Emergency Medicine

## 2014-12-01 ENCOUNTER — Telehealth: Payer: Self-pay | Admitting: Oncology

## 2014-12-01 ENCOUNTER — Emergency Department (HOSPITAL_COMMUNITY): Payer: Federal, State, Local not specified - PPO

## 2014-12-01 ENCOUNTER — Emergency Department (HOSPITAL_COMMUNITY)
Admission: EM | Admit: 2014-12-01 | Discharge: 2014-12-01 | Disposition: A | Discharge status: Home / Self Care | Payer: Federal, State, Local not specified - PPO | Attending: Emergency Medicine | Admitting: Emergency Medicine

## 2014-12-01 ENCOUNTER — Ambulatory Visit (HOSPITAL_COMMUNITY): Payer: Federal, State, Local not specified - PPO

## 2014-12-01 ENCOUNTER — Ambulatory Visit: Payer: Federal, State, Local not specified - PPO

## 2014-12-01 DIAGNOSIS — C799 Secondary malignant neoplasm of unspecified site: Secondary | ICD-10-CM

## 2014-12-01 DIAGNOSIS — C787 Secondary malignant neoplasm of liver and intrahepatic bile duct: Secondary | ICD-10-CM | POA: Insufficient documentation

## 2014-12-01 DIAGNOSIS — C3490 Malignant neoplasm of unspecified part of unspecified bronchus or lung: Secondary | ICD-10-CM | POA: Insufficient documentation

## 2014-12-01 DIAGNOSIS — Z79899 Other long term (current) drug therapy: Secondary | ICD-10-CM | POA: Diagnosis not present

## 2014-12-01 DIAGNOSIS — E119 Type 2 diabetes mellitus without complications: Secondary | ICD-10-CM | POA: Diagnosis not present

## 2014-12-01 DIAGNOSIS — Z87891 Personal history of nicotine dependence: Secondary | ICD-10-CM | POA: Insufficient documentation

## 2014-12-01 DIAGNOSIS — Z794 Long term (current) use of insulin: Secondary | ICD-10-CM | POA: Insufficient documentation

## 2014-12-01 DIAGNOSIS — R339 Retention of urine, unspecified: Secondary | ICD-10-CM

## 2014-12-01 DIAGNOSIS — Z7982 Long term (current) use of aspirin: Secondary | ICD-10-CM | POA: Diagnosis not present

## 2014-12-01 DIAGNOSIS — C7952 Secondary malignant neoplasm of bone marrow: Secondary | ICD-10-CM | POA: Insufficient documentation

## 2014-12-01 DIAGNOSIS — Z7951 Long term (current) use of inhaled steroids: Secondary | ICD-10-CM | POA: Insufficient documentation

## 2014-12-01 DIAGNOSIS — I10 Essential (primary) hypertension: Secondary | ICD-10-CM | POA: Diagnosis not present

## 2014-12-01 LAB — CBC
HCT: 27.1 % — ABNORMAL LOW (ref 39.0–52.0)
HEMOGLOBIN: 8.9 g/dL — AB (ref 13.0–17.0)
MCH: 30.8 pg (ref 26.0–34.0)
MCHC: 32.8 g/dL (ref 30.0–36.0)
MCV: 93.8 fL (ref 78.0–100.0)
Platelets: 145 10*3/uL — ABNORMAL LOW (ref 150–400)
RBC: 2.89 MIL/uL — ABNORMAL LOW (ref 4.22–5.81)
RDW: 14.8 % (ref 11.5–15.5)
WBC: 7.5 10*3/uL (ref 4.0–10.5)

## 2014-12-01 LAB — COMPREHENSIVE METABOLIC PANEL
ALT: 33 U/L (ref 0–53)
ANION GAP: 12 (ref 5–15)
AST: 102 U/L — AB (ref 0–37)
Albumin: 3.1 g/dL — ABNORMAL LOW (ref 3.5–5.2)
Alkaline Phosphatase: 258 U/L — ABNORMAL HIGH (ref 39–117)
BUN: 25 mg/dL — AB (ref 6–23)
CHLORIDE: 97 mmol/L (ref 96–112)
CO2: 24 mmol/L (ref 19–32)
CREATININE: 0.77 mg/dL (ref 0.50–1.35)
Calcium: 8.5 mg/dL (ref 8.4–10.5)
GFR calc Af Amer: >90 mL/min (ref >90)
GFR calc non Af Amer: >90 mL/min (ref >90)
Glucose, Bld: 184 mg/dL — ABNORMAL HIGH (ref 70–99)
POTASSIUM: 3.7 mmol/L (ref 3.5–5.1)
Sodium: 133 mmol/L — ABNORMAL LOW (ref 135–145)
TOTAL PROTEIN: 7.1 g/dL (ref 6.0–8.3)
Total Bilirubin: 1.4 mg/dL — ABNORMAL HIGH (ref 0.3–1.2)

## 2014-12-01 LAB — URINALYSIS, ROUTINE W REFLEX MICROSCOPIC
BILIRUBIN URINE: NEGATIVE
Glucose, UA: NEGATIVE mg/dL
KETONES UR: NEGATIVE mg/dL
Leukocytes, UA: NEGATIVE
NITRITE: NEGATIVE
PH: 5.5 (ref 5.0–8.0)
Protein, ur: 30 mg/dL — AB
Specific Gravity, Urine: 1.022 (ref 1.005–1.030)
Urobilinogen, UA: 1 mg/dL (ref 0.0–1.0)

## 2014-12-01 LAB — URINE MICROSCOPIC-ADD ON

## 2014-12-01 MED ORDER — GADOBENATE DIMEGLUMINE 529 MG/ML IV SOLN
20.0000 mL | Freq: Once | INTRAVENOUS | Status: AC | PRN
  Administered 2014-12-01: 18 mL via INTRAVENOUS

## 2014-12-01 MED ORDER — HYDROMORPHONE HCL 1 MG/ML IJ SOLN
1.0000 mg | Freq: Once | INTRAMUSCULAR | Status: AC
  Administered 2014-12-01: 1 mg via INTRAVENOUS
  Filled 2014-12-01: qty 1

## 2014-12-01 MED ORDER — DIAZEPAM 5 MG/ML IJ SOLN
7.5000 mg | Freq: Once | INTRAMUSCULAR | Status: AC
  Administered 2014-12-01: 7.5 mg via INTRAVENOUS
  Filled 2014-12-01: qty 2

## 2014-12-01 MED ORDER — OXYCODONE HCL 10 MG PO TABS: 10.0000 mg | ORAL_TABLET | ORAL | Status: DC | PRN

## 2014-12-01 MED ORDER — HYDROMORPHONE HCL 1 MG/ML IJ SOLN
1.0000 mg | Freq: Once | INTRAMUSCULAR | Status: AC
Start: 2014-12-01 — End: 2014-12-01
  Administered 2014-12-01: 1 mg via INTRAVENOUS
  Filled 2014-12-01: qty 1

## 2014-12-01 NOTE — Telephone Encounter (Signed)
Natalie's sister, Rosanne Ashing called and left a message.  Returned her call and she said Borden is now in the ED with a possible UTI with lower back pain, redness at tip of his penis from self-catheterizing.  Advised her to call if she needs anything.  Teressa verbalized agreement.  Notified Amy, RT on [place] [address].

## 2014-12-01 NOTE — ED Provider Notes (Signed)
Assumed care in sign out with MRI pending. 46yM with known metastatic CA. Recent admit for poorly controlled pain and developed urinary retention necessitating placement of foley.  Thoracic? MR done while hospitalized.  Because of retention, MR L spine done today. Significant for:  1. Diffuse osseous metastatic disease with no visible level spared. 2. 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 No lumbar intradural tumor identified. 3. 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  Pt with no acute neurological complaints otherwise. Will discuss with neurosurgery in terms of wether acute neurosurgical intervention versus possibly radiation therapy.   Discussed with Dr Sherwood Gambler, no surgical intervention at this time. F/u with oncology. Pt updated with MRI findings. He would like to go home. Will increase dose of home pain meds. Further neurological deficits and other return precautions discussed.   Virgel Manifold, MD 12/05/14 [phone]

## 2014-12-01 NOTE — Progress Notes (Signed)
Patient noted to be in the ED with increased pain.  Patient recently admitted and discharged from 04/02 to 04/05 for cancer associated pain.  Patient is being followed by [facility].  EDCM spoke to patient and his sister Kerry Peck at bedside.  EDCM informed patient and his sister that [place] will not be able to see the patient until Saturday and would patient like to chose another home health agency?  Patient's sister reports she will be with the patient on Saturday.  Patient and his sister Kerry Peck agreeable to stay on with [facility] services.  Patient has been ordered home health RN, PT, OT, aide and social worker.  [facility] has received this referral.  EDCM called [facility] and spoke to [facility] of intake to infrom her that patient would like to continue with home health services with [place].  Patient's sister Kerry Peck [phone] would like to called for services.  Theresa's phone number was given to [employer] per patient's sister request.  EDCM did inform Vicky of [facility] that patient was present in [facility] ED currently.  No further EDCM needs at this time.

## 2014-12-01 NOTE — Telephone Encounter (Signed)
Correction: patient has a foley catheter.

## 2014-12-01 NOTE — Discharge Instructions (Signed)
Bone Metastases Cancerous growths can begin in any part of the body. The original site of cancer is called the primary tumor or primary cancer (for example, breast cancer). After cancer has developed in one area of the body, cancerous cells from that area can break away and travel through the body's bloodstream. If these cancerous cells begin growing in another place in the body, they are called metastases. Bone metastases are cancer cells that have spread to the bone (which is different from a cancer that starts in the bone). These secondary growths are like the original tumor. For example, if a prostate cancer spreads to bone it is called metastatic prostate cancer, or prostate cancer metastatic to bone, but not bone cancer. Cancers can spread to almost any bone; the spine and pelvis are often involved.  Any type of cancer can spread to the bone, but the most common are breast, lung, kidney, thyroid and prostate cancers. Sometimes the primary tumor is not discovered until there are bone problems. If the primary cancer location cannot be discovered, the cancer is called cancer of unknown primary location. SYMPTOMS  Pain in the bones is the main symptom of bone metastases. Some other problems may occur first including:  Decreased appetite.  Nausea.  Muscle weakness.  Confusion.  Unusual sleep patterns due to discomfort.  Overly tired (fatigue).  Restlessness. Frail or brittle bones may lead to broken bones (fractures) that lead to learning what is wrong (diagnosis). A tumor often weakens the bones.  DIAGNOSIS  Metastatic cancers may be found months or years after or at the same time as the primary tumor. When a second tumor is found in a patient who has been treated for cancer, it is more often a metastasis than another primary tumor.  The patient's symptoms, physical examination, X-rays and blood tests may suggest a bone metastases. In addition, an examination of tissue or a cell sample  (biopsy) is usually done to find the cancer. This sample is removed with a needle. This tissue sample must be looked at under a microscope to confirm a diagnosis. TREATMENT  Options generally include treatments that give relief from symptoms (palliative) or curative. Those with advanced, metastasized cancer may receive treatment focused on pain relief and prolonging life. These treatments depend on the type of cancer and its location.  Treatment for cancer depends on its type and location. Some of these treatments are:  Surgery to remove the original tumor and/or to remove parts of the body that produce hormones and other chemicals that make cancer worse.  Treatment with drugs (chemotherapy).  Bone marrow transplantations on rare occasions.  Radiation therapy (radiotherapy).  Hormonal therapy.  Pain relieving medications. Your caregiver will help you understand the likelihood that any particular treatment will be helpful for you. While some treatments aim to cure or control the cancer, others give relief from symptoms only. If you have bone metastases, radiation therapy may be recommended to treat pain (if it is in one main location). Pain medications are available. These include strong medicines like morphine. You may be instructed to take a long-acting pain medication (to control most of your pain) and a short-acting medication to control occasional flares of pain. Pain medication is sometimes also given continuously through a pump. HOME CARE INSTRUCTIONS   Take medications exactly as prescribed.  Keep any follow-up appointments.  Pain medications can make you sleepy or confused. Do not drive, climb ladders, or do other dangerous activities while on pain medication.  Pain medications often  cause constipation. Ask your caregiver for information on stool softeners.  Do not share your pain medication with others. SEEK MEDICAL CARE IF:   Your bone pain is not controlled.  You are having  problems or side effects from your medication.  You have excessive sleepiness or confusion. SEEK IMMEDIATE MEDICAL CARE IF:   You fall and have any injury or pain from the fall.  You have trouble walking.  You have numbness or tingling in your legs.  You develop a sudden significant worsening of your pain. Document Released: 08/02/2002 Document Revised: 11/04/2011 Document Reviewed: 03/25/2008 [facility] Patient Information 2015 [place], [place]. This information is not intended to replace advice given to you by your health care provider. Make sure you discuss any questions you have with your health care provider.

## 2014-12-01 NOTE — ED Notes (Signed)
Bladder scan showed 0 cc's.

## 2014-12-01 NOTE — ED Provider Notes (Signed)
CSN: [id number]     Arrival date & time 12/01/14  1158 History   First MD Initiated Contact with Patient 12/01/14 1318     Chief Complaint  Patient presents with  . Tailbone Pain  . Urinary Retention     (Consider location/radiation/quality/duration/timing/severity/associated sxs/prior Treatment) HPI  complains of back pain radiating from tailbone to mid thoracic area for the past several weeks. Patient recently admitted here noted to have metastatic disease with multiple compression fractures of spine. Left hospital with Foley catheter. Presents today as pain not well controlled with oxycodone prescribed. No fever. No other associated symptoms. Pain worse with movement. Walking. No other associated symptoms. Past Medical History  Diagnosis Date  . Diabetes mellitus without complication   . Hypertension   . Smoking addiction   . Cancer     lung and liver  . Radiation 08/16/14-09/06/14    prophylatic cranial radiation 25 gray, chest, t-spine  35 gray  . Radiation 09/29/14-10/12/14    left hilar area 30 gray, right pelvis 30 gray, right lower rib cage area 30 gray  . 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 No pertinent past surgical history. Family History  Problem Relation Age of Onset  . Pancreatic cancer Mother    History  Substance Use Topics  . Smoking status: Former Smoker -- 0.75 packs/day for 30 years    Types: Cigarettes    Quit date: 06/03/2014  . Smokeless tobacco: Never Used  . Alcohol Use: 18.0 oz/week    30 Cans of beer per week     Comment: 6 beers per day x 5 days per week    Review of Systems  Constitutional: Negative.   HENT: Negative.   Respiratory: Negative.   Cardiovascular: Negative.   Gastrointestinal: Negative.   Genitourinary: Positive for difficulty urinating.  Musculoskeletal: Positive for back pain and gait problem.       Walks with cane  Skin: Negative.   Neurological: Negative.   Psychiatric/Behavioral: Negative.   All  other systems reviewed and are negative.     Allergies  Review of patient's allergies indicates no known allergies.  Home Medications   Prior to Admission medications   Medication Sig Start Date End Date Taking? Authorizing Provider  albuterol (PROVENTIL HFA;VENTOLIN HFA) 108 (90 BASE) MCG/ACT inhaler Inhale 2 puffs into the lungs every 6 (six) hours as needed for wheezing. 08/09/12  Yes Leandrew Koyanagi, MD  amLODipine (NORVASC) 10 MG tablet Take 10 mg by mouth daily.   Yes Historical Provider, MD  aspirin EC 81 MG tablet Take 81 mg by mouth as directed. Take 1 tablet all days except Wednesday   Yes Historical Provider, MD  atorvastatin (LIPITOR) 40 MG tablet Take 40 mg by mouth daily.   Yes Historical Provider, MD  calcium-vitamin D (OSCAL WITH D) 500-200 MG-UNIT per tablet Take 1 tablet by mouth daily with breakfast.    Yes Historical Provider, MD  dexamethasone (DECADRON) 4 MG tablet Take 1 tablet (4 mg total) by mouth 4 (four) times daily. 11/23/14  Yes Curt Bears, MD  emollient (BIAFINE) cream Apply topically as needed. Patient taking differently: Apply 1 application topically as needed. For radiation itch. 08/16/14  Yes Gery Pray, MD  esomeprazole (NEXIUM) 20 MG capsule Take 20 mg by mouth daily as needed (GERD).    Yes Historical Provider, MD  fenofibrate 160 MG tablet Take 160 mg by mouth daily.   Yes Historical Provider, MD  Fluticasone-Salmeterol (ADVAIR) 250-50 MCG/DOSE AEPB Inhale 1  puff into the lungs every 12 (twelve) hours.   Yes Historical Provider, MD  Insulin Glargine (LANTUS) 100 UNIT/ML Solostar Pen Inject 15 Units into the skin daily. 11/29/14  Yes Albertine Patricia, MD  Insulin Pen Needle (PEN NEEDLES 3/16") 31G X 5 MM MISC Use daily with your Lantus injection 11/29/14  Yes Albertine Patricia, MD  metFORMIN (GLUCOPHAGE) 500 MG tablet Take 1,000 mg by mouth 2 (two) times daily with a meal.    Yes Historical Provider, MD  metoprolol tartrate (LOPRESSOR) 25 MG  tablet Take 25 mg by mouth daily.    Yes Historical Provider, MD  Multiple Vitamins-Minerals (CENTRUM SILVER ADULT 50+ PO) Take 1 tablet by mouth daily.    Yes Historical Provider, MD  ondansetron (ZOFRAN) 8 MG tablet Take 8 mg by mouth every 8 (eight) hours as needed for nausea or vomiting. Disp. 20 tablets. 1 refill.  Called in to [facility] per Dr. Sondra Come on 11/24/14.   Yes Historical Provider, MD  ONE TOUCH ULTRA TEST test strip 1 each by Other route 3 (three) times daily.  04/11/14  Yes Historical Provider, MD  oxyCODONE 10 MG TABS Take 1-1.5 tablets (10-15 mg total) by mouth every 4 (four) hours as needed for moderate pain or severe pain. 11/29/14  Yes Albertine Patricia, MD  OxyCODONE HCl ER 60 MG T12A Take 60 mg by mouth every 12 (twelve) hours. 11/29/14  Yes Silver Huguenin Elgergawy, MD  pantoprazole (PROTONIX) 40 MG tablet Take 40 mg by mouth daily.  05/19/14  Yes Historical Provider, MD  polyethylene glycol (MIRALAX / GLYCOLAX) packet Take 17 g by mouth daily. 11/29/14  Yes Albertine Patricia, MD  pyridOXINE (VITAMIN B-6) 100 MG tablet Take 100 mg by mouth daily.   Yes Historical Provider, MD  senna (SENOKOT) 8.6 MG TABS tablet Take 1 tablet (8.6 mg total) by mouth 2 (two) times daily. 11/29/14  Yes Silver Huguenin Elgergawy, MD  sucralfate (CARAFATE) 1 GM/10ML suspension Take 10 mLs (1 g total) by mouth 4 (four) times daily -  with meals and at bedtime. Patient taking differently: Take 1 g by mouth 4 (four) times daily -  with meals and at bedtime. . 09/15/14  Yes Gery Pray, MD  tamsulosin (FLOMAX) 0.4 MG CAPS capsule Take 1 capsule (0.4 mg total) by mouth daily. 11/29/14  Yes Albertine Patricia, MD  vitamin C (ASCORBIC ACID) 500 MG tablet Take 500 mg by mouth daily.   Yes Historical Provider, MD  Blood Pressure Monitoring (5 SERIES BP MONITOR) DEVI  06/08/14   Historical Provider, MD  prochlorperazine (COMPAZINE) 10 MG tablet Take 1 tablet (10 mg total) by mouth every 6 (six) hours as needed for nausea or  vomiting. Patient not taking: Reported on 12/01/2014 03/03/14   Curt Bears, MD   BP 120/94 mmHg  Pulse 111  Temp(Src) 98.4 F (36.9 C) (Oral)  Resp 16  SpO2 94% Physical Exam  Constitutional: He appears well-developed and well-nourished.  Chronically ill-appearing  HENT:  Head: Normocephalic and atraumatic.  Eyes: Conjunctivae are normal. Pupils are equal, round, and reactive to light.  Neck: Neck supple. No tracheal deviation present. No thyromegaly present.  Cardiovascular: Normal rate and regular rhythm.   No murmur heard. Pulmonary/Chest: Effort normal and breath sounds normal.  Abdominal: Soft. Bowel sounds are normal. He exhibits no distension. There is no tenderness.  Genitourinary:  Foley catheter in place  Musculoskeletal: Normal range of motion. He exhibits no edema or tenderness.  [employer] spine nontender.  Pain at mid and lower back upon sitting up from a supine position Walks with minimal assistance. Pelvis stable nontender.  Neurological: He is alert. Coordination normal.  Skin: Skin is warm and dry. No rash noted.  Psychiatric: He has a normal mood and affect.  Nursing note and vitals reviewed.   ED Course  Procedures (including critical care time) Labs Review Labs Reviewed  URINALYSIS, ROUTINE W REFLEX MICROSCOPIC  CBC  COMPREHENSIVE METABOLIC PANEL    Imaging Review No results found.   EKG Interpretation None      Results for orders placed or performed during the hospital encounter of 12/01/14  Urinalysis, Routine w reflex microscopic (if pt has temp above 100.66F)  Result Value Ref Range   Color, Urine AMBER (A) YELLOW   APPearance CLOUDY (A) CLEAR   Specific Gravity, Urine 1.022 1.005 - 1.030   pH 5.5 5.0 - 8.0   Glucose, UA NEGATIVE NEGATIVE mg/dL   Hgb urine dipstick MODERATE (A) NEGATIVE   Bilirubin Urine NEGATIVE NEGATIVE   Ketones, ur NEGATIVE NEGATIVE mg/dL   Protein, ur 30 (A) NEGATIVE mg/dL   Urobilinogen, UA 1.0 0.0 - 1.0 mg/dL    Nitrite NEGATIVE NEGATIVE   Leukocytes, UA NEGATIVE NEGATIVE  CBC (if pt has a temp above 100.66F)  Result Value Ref Range   WBC 7.5 4.0 - 10.5 K/uL   RBC 2.89 (L) 4.22 - 5.81 MIL/uL   Hemoglobin 8.9 (L) 13.0 - 17.0 g/dL   HCT 27.1 (L) 39.0 - 52.0 %   MCV 93.8 78.0 - 100.0 fL   MCH 30.8 26.0 - 34.0 pg   MCHC 32.8 30.0 - 36.0 g/dL   RDW 14.8 11.5 - 15.5 %   Platelets 145 (L) 150 - 400 K/uL  Comprehensive metabolic panel (if pt has a temp above 100.66F)  Result Value Ref Range   Sodium 133 (L) 135 - 145 mmol/L   Potassium 3.7 3.5 - 5.1 mmol/L   Chloride 97 96 - 112 mmol/L   CO2 24 19 - 32 mmol/L   Glucose, Bld 184 (H) 70 - 99 mg/dL   BUN 25 (H) 6 - 23 mg/dL   Creatinine, Ser 0.77 0.50 - 1.35 mg/dL   Calcium 8.5 8.4 - 10.5 mg/dL   Total Protein 7.1 6.0 - 8.3 g/dL   Albumin 3.1 (L) 3.5 - 5.2 g/dL   AST 102 (H) 0 - 37 U/L   ALT 33 0 - 53 U/L   Alkaline Phosphatase 258 (H) 39 - 117 U/L   Total Bilirubin 1.4 (H) 0.3 - 1.2 mg/dL   GFR calc non Af Amer >90 >90 mL/min   GFR calc Af Amer >90 >90 mL/min   Anion gap 12 5 - 15  Urine microscopic-add on  Result Value Ref Range   Squamous Epithelial / LPF RARE RARE   WBC, UA 3-6 <3 WBC/hpf   RBC / HPF 11-20 <3 RBC/hpf   Bacteria, UA FEW (A) RARE   Urine-Other MUCOUS PRESENT    Ct Chest W Contrast  11/16/2014   CLINICAL DATA:  Lung cancer restaging. Left-sided chest pain for 1 week. Diagnosed in 2015. Chemotherapy and radiation therapy complete. Small cell type. Diabetes. Hypertension.  EXAM: CT CHEST, ABDOMEN, AND PELVIS WITH CONTRAST  TECHNIQUE: Multidetector CT imaging of the chest, abdomen and pelvis was performed following the standard protocol during bolus administration of intravenous contrast.  CONTRAST:  147mL OMNIPAQUE IOHEXOL 300 MG/ML  SOLN  COMPARISON:  09/09/2014  FINDINGS: CT CHEST FINDINGS  Mediastinum/Nodes: No supraclavicular adenopathy. Extensive aortic atherosclerosis. Normal heart size, without pericardial effusion.  Multivessel coronary artery atherosclerosis. No central pulmonary embolism, on this non-dedicated study. No mediastinal adenopathy. Decrease in left hilar adenopathy. 1.4 x 1.4 cm node on image 31 is decreased from 2.5 x 2.2 cm on the prior. No right hilar adenopathy.  Lungs/Pleura: Trace left pleural fluid is new. Secretions along the trachea. Moderate centrilobular emphysema.  Left lower lobe spiculated pulmonary nodule measures 1.8 x 1.6 cm on image 37 and is felt to be similar to 2.2 x 1.4 cm on the prior.  Minimal nodularity along the left major fissure on image 34 is new. Nonspecific.  Musculoskeletal: Progressive osseous metastasis. Posterior vertebral body tumor at T4 and T10 encroach upon the ventral canal. Example sagittal image 61. Axial images 43 and 67.  CT ABDOMEN PELVIS FINDINGS  Hepatobiliary: Development of hepatic metastasis. Example right hepatic lobe 2.1 cm lesion on image 64.  Lateral segment left liver lobe 2.3 cm lesion on image 53. Resolved hepatic steatosis.  Cholelithiasis without acute cholecystitis or biliary ductal dilatation.  Pancreas: Hypo attenuating lesion in the pancreatic tail measures 11 mm on image 65 and may have increased minimally from the prior exam. Suspicious for a metastasis.  Spleen: Normal  Adrenals/Urinary Tract: Normal adrenal glands. Too small to characterize interpolar left renal lesion. Normal right kidney, without hydronephrosis. Possible bladder wall thickening, mild.  Stomach/Bowel: Normal stomach, without wall thickening. Normal colon, appendix, and terminal ileum. Normal small bowel.  Vascular/Lymphatic: Advanced aortic and branch vessel atherosclerosis. Developing retroperitoneal adenopathy, including a left periaortic 1.4 cm node on image 75. Likely necrotic portacaval node at 1.7 cm on image 66. No pelvic adenopathy.  Reproductive: Normal prostate.  Other: No significant free fluid. No evidence of omental or peritoneal disease. Tiny fat containing bilateral  inguinal hernias.  Musculoskeletal: Widespread osseous metastasis. Progressive. Right iliac lesion is enlarged with cortical destruction/pathologic fracture. Ill-defined lytic metastasis throughout the sacrum is progressive. Soft tissue component extends anterior to the left side of the sacrum including on image 103. Markedly progressive thoracolumbar vertebral metastatic disease. Tumor involvement involving the posterior aspect of L2 extends minimally into the ventral canal. Multiple compression deformities. Similar.  IMPRESSION: CT CHEST IMPRESSION  1. Similar left lower lobe pulmonary nodule with decrease in left hilar adenopathy. 2. Progressive osseous metastasis. Tumor canal encroachment involving the thoracic spine. Consider further evaluation with pre and post contrast thoracic spine MRI. 3.  Atherosclerosis, including within the coronary arteries. 4. New trace left pleural fluid.  CT ABDOMEN AND PELVIS IMPRESSION  1. Development of hepatic metastasis. 2. Developing nodal metastasis within the upper abdomen. 3. Progressive osseous metastasis. Lumbar vertebral canal encroachment would also be better evaluated with pre and post contrast lumbar spine MRI. 4. Hypo attenuating pancreatic lesion is suspicious for metastasis. Similar to minimally progressive. 5. Cholelithiasis. 6. Possible mild bladder wall thickening. Correlate with symptoms to suggest cystitis or bladder outlet obstruction.   Electronically Signed   By: Abigail Miyamoto M.D.   On: 11/16/2014 10:56   Mr Thoracic Spine W Wo Contrast  11/26/2014   CLINICAL DATA:  Known spinal metastatic disease. Unable to bear weight. Urinary retention. Severe pain.  EXAM: MRI THORACIC SPINE WITHOUT AND WITH CONTRAST  TECHNIQUE: Multiplanar and multiecho pulse sequences of the thoracic spine were obtained without and with intravenous contrast.  CONTRAST:  32mL MULTIHANCE GADOBENATE DIMEGLUMINE 529 MG/ML IV SOLN  COMPARISON:  11/23/2014  FINDINGS: Sagittal images  proceed from left to right,  contrary to our normal convention.  No change is appreciated since the study of 3 days ago. There is diffuse marrow involvement by metastatic tumor throughout the region image to including the sternum, ribs and spine. At the T1-2 level, there is extraosseous tumor within the left side of the spinal canal which displaces the cord towards the right and could cause cord compressive symptoms.  At T2-3, there is a small amount of epidural tumor on the right which contacts the cord but does not appear to cause cord compression.  At T8, there is some ventral epidural tumor that contacts the ventral aspect of the cord but does not appear to cause cord compression.  At T9, there is ventral epidural tumor that contacts the ventral aspect of the cord and may deform the cord slightly on the left.  At T10, there is a small amount of ventral epidural tumor without cord compression.  Partial compression fractures are present at multiple levels, unchanged since the previous study. These include T3, T8, T9, T10, T11 and T12.  I do not identify any intra medullary metastatic disease in the region.  IMPRESSION: Please note sagittal images proceed from left to right, contrary to our normal convention.  No change since the study of 3 days ago. There could be potential for some cord compressive symptoms based on the disease at T1-2 and T9.  Critical Value/emergent results were called by telephone at the time of interpretation on 11/26/2014 at 2:42 pm to Dr. Phillips Climes , who verbally acknowledged these results.   Electronically Signed   By: Nelson Chimes M.D.   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 Left-sided chest pain. Spinal canal encroachment by tumor on CT scan of the chest dated 11/16/2014  EXAM: MRI THORACIC SPINE WITHOUT AND WITH CONTRAST  TECHNIQUE: Multiplanar and multiecho pulse sequences of the thoracic  spine were obtained without and with intravenous contrast.  CONTRAST:  58mL MULTIHANCE GADOBENATE DIMEGLUMINE 529 MG/ML IV SOLN  COMPARISON:  CT scan dated 11/16/2014  FINDINGS: Tumor infiltrates every thoracic vertebra except T7. Epidural tumor extends into the the left side of the spinal canal at T1-2 and deviates the spinal cord to the right. Tumor extends into the left neural foramina at T1-2 and T2-3. There is extensive tumor in the posterior elements of T3 and T4. There pathologic compression fractures of T3, T8, T9, and T11 and T12.  The posterior margins of T3 and T8 and T9 protrude into the spinal canal. Tumor encroaches on the left neural foramen at T9-10.  Note is made of numerous metastatic lesions in the liver.  IMPRESSION: 1. Diffuse metastatic disease throughout the entire thoracic spine with the exception of sparing of the T7 vertebra. 2. Extensive epidural tumor on the left at T2-3 which deviates spinal cord to the right. 3. Multiple pathologic fractures with slight protrusions of the posterior aspects of T3, T8 and T9 into the spinal canal with extension into the left neural foramen at T9-10.   Electronically Signed   By: Lorriane Shire M.D.   On: 11/24/2014 08:25   Ct Abdomen Pelvis W Contrast  11/16/2014   CLINICAL DATA:  Lung cancer restaging. Left-sided chest pain for 1 week. Diagnosed in 2015. Chemotherapy and radiation therapy complete. Small cell type. Diabetes. Hypertension.  EXAM: CT CHEST, ABDOMEN, AND PELVIS WITH CONTRAST  TECHNIQUE: Multidetector CT imaging of the chest, abdomen and  pelvis was performed following the standard protocol during bolus administration of intravenous contrast.  CONTRAST:  121mL OMNIPAQUE IOHEXOL 300 MG/ML  SOLN  COMPARISON:  09/09/2014  FINDINGS: CT CHEST FINDINGS  Mediastinum/Nodes: No supraclavicular adenopathy. Extensive aortic atherosclerosis. Normal heart size, without pericardial effusion. Multivessel coronary artery atherosclerosis. No central  pulmonary embolism, on this non-dedicated study. No mediastinal adenopathy. Decrease in left hilar adenopathy. 1.4 x 1.4 cm node on image 31 is decreased from 2.5 x 2.2 cm on the prior. No right hilar adenopathy.  Lungs/Pleura: Trace left pleural fluid is new. Secretions along the trachea. Moderate centrilobular emphysema.  Left lower lobe spiculated pulmonary nodule measures 1.8 x 1.6 cm on image 37 and is felt to be similar to 2.2 x 1.4 cm on the prior.  Minimal nodularity along the left major fissure on image 34 is new. Nonspecific.  Musculoskeletal: Progressive osseous metastasis. Posterior vertebral body tumor at T4 and T10 encroach upon the ventral canal. Example sagittal image 61. Axial images 43 and 67.  CT ABDOMEN PELVIS FINDINGS  Hepatobiliary: Development of hepatic metastasis. Example right hepatic lobe 2.1 cm lesion on image 64.  Lateral segment left liver lobe 2.3 cm lesion on image 53. Resolved hepatic steatosis.  Cholelithiasis without acute cholecystitis or biliary ductal dilatation.  Pancreas: Hypo attenuating lesion in the pancreatic tail measures 11 mm on image 65 and may have increased minimally from the prior exam. Suspicious for a metastasis.  Spleen: Normal  Adrenals/Urinary Tract: Normal adrenal glands. Too small to characterize interpolar left renal lesion. Normal right kidney, without hydronephrosis. Possible bladder wall thickening, mild.  Stomach/Bowel: Normal stomach, without wall thickening. Normal colon, appendix, and terminal ileum. Normal small bowel.  Vascular/Lymphatic: Advanced aortic and branch vessel atherosclerosis. Developing retroperitoneal adenopathy, including a left periaortic 1.4 cm node on image 75. Likely necrotic portacaval node at 1.7 cm on image 66. No pelvic adenopathy.  Reproductive: Normal prostate.  Other: No significant free fluid. No evidence of omental or peritoneal disease. Tiny fat containing bilateral inguinal hernias.  Musculoskeletal: Widespread osseous  metastasis. Progressive. Right iliac lesion is enlarged with cortical destruction/pathologic fracture. Ill-defined lytic metastasis throughout the sacrum is progressive. Soft tissue component extends anterior to the left side of the sacrum including on image 103. Markedly progressive thoracolumbar vertebral metastatic disease. Tumor involvement involving the posterior aspect of L2 extends minimally into the ventral canal. Multiple compression deformities. Similar.  IMPRESSION: CT CHEST IMPRESSION  1. Similar left lower lobe pulmonary nodule with decrease in left hilar adenopathy. 2. Progressive osseous metastasis. Tumor canal encroachment involving the thoracic spine. Consider further evaluation with pre and post contrast thoracic spine MRI. 3.  Atherosclerosis, including within the coronary arteries. 4. New trace left pleural fluid.  CT ABDOMEN AND PELVIS IMPRESSION  1. Development of hepatic metastasis. 2. Developing nodal metastasis within the upper abdomen. 3. Progressive osseous metastasis. Lumbar vertebral canal encroachment would also be better evaluated with pre and post contrast lumbar spine MRI. 4. Hypo attenuating pancreatic lesion is suspicious for metastasis. Similar to minimally progressive. 5. Cholelithiasis. 6. Possible mild bladder wall thickening. Correlate with symptoms to suggest cystitis or bladder outlet obstruction.   Electronically Signed   By: Abigail Miyamoto M.D.   On: 11/16/2014 10:56    Patient signed out to Dr.Kohut at 445 pm,Bladder scan showed 0 urine in bladder. Foley is draining well MDM    Spoke with oncologist on call Dr. Marvia Pickles :MRI scan of lumbar spine ordered as concern for cauda equina syndrome urinary retention.  If MRI scan shows that no further intervention needed  . If patient has evidence of cord impingement he may need neurosurgical consult or emergent/urgent radiation therapy and medical therapy.  Patient can have oxycodone increased to 20 mg every 4 hours as needed for  pain. And he can follow up with Dr. Mckinley Jewel next week Final diagnoses:  None   diagnosis #1 back pain #2 urinary retention #3 anemia #3 hyperglycemia     Orlie Dakin, MD 12/01/14 [phone]

## 2014-12-01 NOTE — ED Notes (Signed)
Pt was d/c on Tuesday from [facility] after being admitted for pain associated with his cancer. Pt has lung cancer with mets to multiple sites. Upon d/c pt was given a urinary catheter because he was retaining urine. Pt comes in today c/o tailbone pain, worsening since d/c from hospital, and pain at the tip of his penis from his foley catheter. Foley is draining urine at this time. No redness or swelling to site noted. Pt A&Ox4.

## 2014-12-02 ENCOUNTER — Other Ambulatory Visit: Payer: Federal, State, Local not specified - PPO

## 2014-12-02 ENCOUNTER — Ambulatory Visit (HOSPITAL_BASED_OUTPATIENT_CLINIC_OR_DEPARTMENT_OTHER): Payer: Federal, State, Local not specified - PPO

## 2014-12-02 ENCOUNTER — Other Ambulatory Visit (HOSPITAL_BASED_OUTPATIENT_CLINIC_OR_DEPARTMENT_OTHER): Payer: Federal, State, Local not specified - PPO

## 2014-12-02 ENCOUNTER — Ambulatory Visit
Admission: RE | Admit: 2014-12-02 | Discharge: 2014-12-02 | Disposition: A | Discharge status: Home / Self Care | Payer: Federal, State, Local not specified - PPO | Source: Ambulatory Visit | Attending: Radiation Oncology | Admitting: Radiation Oncology

## 2014-12-02 ENCOUNTER — Ambulatory Visit: Payer: Federal, State, Local not specified - PPO | Admitting: Physician Assistant

## 2014-12-02 DIAGNOSIS — C3490 Malignant neoplasm of unspecified part of unspecified bronchus or lung: Secondary | ICD-10-CM

## 2014-12-02 DIAGNOSIS — C7B8 Other secondary neuroendocrine tumors: Secondary | ICD-10-CM

## 2014-12-02 DIAGNOSIS — C7A1 Malignant poorly differentiated neuroendocrine tumors: Secondary | ICD-10-CM

## 2014-12-02 DIAGNOSIS — Z5111 Encounter for antineoplastic chemotherapy: Secondary | ICD-10-CM

## 2014-12-02 LAB — CBC WITH DIFFERENTIAL/PLATELET
BASO%: 0.1 % (ref 0.0–2.0)
BASOS ABS: 0 10*3/uL (ref 0.0–0.1)
EOS%: 0.1 % (ref 0.0–7.0)
Eosinophils Absolute: 0 10*3/uL (ref 0.0–0.5)
HCT: 28 % — ABNORMAL LOW (ref 38.4–49.9)
HEMOGLOBIN: 9.3 g/dL — AB (ref 13.0–17.1)
LYMPH#: 0.2 10*3/uL — AB (ref 0.9–3.3)
LYMPH%: 2.5 % — ABNORMAL LOW (ref 14.0–49.0)
MCH: 31 pg (ref 27.2–33.4)
MCHC: 33.2 g/dL (ref 32.0–36.0)
MCV: 93.3 fL (ref 79.3–98.0)
MONO#: 0.6 10*3/uL (ref 0.1–0.9)
MONO%: 7.9 % (ref 0.0–14.0)
NEUT#: 7 10*3/uL — ABNORMAL HIGH (ref 1.5–6.5)
NEUT%: 89.4 % — ABNORMAL HIGH (ref 39.0–75.0)
Platelets: 144 10*3/uL (ref 140–400)
RBC: 3 10*6/uL — ABNORMAL LOW (ref 4.20–5.82)
RDW: 14.9 % — AB (ref 11.0–14.6)
WBC: 7.9 10*3/uL (ref 4.0–10.3)

## 2014-12-02 LAB — COMPREHENSIVE METABOLIC PANEL (CC13)
ALBUMIN: 2.5 g/dL — AB (ref 3.5–5.0)
ALK PHOS: 253 U/L — AB (ref 40–150)
ALT: 31 U/L (ref 0–55)
AST: 92 U/L — AB (ref 5–34)
Anion Gap: 14 mEq/L — ABNORMAL HIGH (ref 3–11)
BILIRUBIN TOTAL: 1.14 mg/dL (ref 0.20–1.20)
BUN: 18.4 mg/dL (ref 7.0–26.0)
CO2: 22 mEq/L (ref 22–29)
Calcium: 9.5 mg/dL (ref 8.4–10.4)
Chloride: 100 mEq/L (ref 98–109)
Creatinine: 0.8 mg/dL (ref 0.7–1.3)
Glucose: 314 mg/dl — ABNORMAL HIGH (ref 70–140)
POTASSIUM: 4.2 meq/L (ref 3.5–5.1)
SODIUM: 137 meq/L (ref 136–145)
TOTAL PROTEIN: 6.9 g/dL (ref 6.4–8.3)

## 2014-12-02 LAB — TECHNOLOGIST REVIEW

## 2014-12-02 LAB — MAGNESIUM (CC13): Magnesium: 1.7 mg/dl (ref 1.5–2.5)

## 2014-12-02 MED ORDER — SODIUM CHLORIDE 0.9 % IV SOLN
30.0000 mg/m2 | Freq: Once | INTRAVENOUS | Status: AC
  Administered 2014-12-02: 65 mg via INTRAVENOUS
  Filled 2014-12-02: qty 65

## 2014-12-02 MED ORDER — MORPHINE SULFATE 4 MG/ML IJ SOLN
2.0000 mg | INTRAMUSCULAR | Status: DC | PRN
  Administered 2014-12-02: 2 mg via INTRAVENOUS

## 2014-12-02 MED ORDER — POTASSIUM CHLORIDE 2 MEQ/ML IV SOLN
Freq: Once | INTRAVENOUS | Status: AC
  Administered 2014-12-02: 12:00:00 via INTRAVENOUS
  Filled 2014-12-02: qty 10

## 2014-12-02 MED ORDER — SODIUM CHLORIDE 0.9 % IV SOLN
Freq: Once | INTRAVENOUS | Status: AC
  Administered 2014-12-02: 14:00:00 via INTRAVENOUS

## 2014-12-02 MED ORDER — SODIUM CHLORIDE 0.9 % IV SOLN
Freq: Once | INTRAVENOUS | Status: AC
  Administered 2014-12-02: 15:00:00 via INTRAVENOUS
  Filled 2014-12-02: qty 5

## 2014-12-02 MED ORDER — MORPHINE SULFATE 4 MG/ML IJ SOLN
INTRAMUSCULAR | Status: AC
  Filled 2014-12-02: qty 1

## 2014-12-02 MED ORDER — SODIUM CHLORIDE 0.9 % IV SOLN
Freq: Once | INTRAVENOUS | Status: AC
  Administered 2014-12-02: 12:00:00 via INTRAVENOUS

## 2014-12-02 MED ORDER — IRINOTECAN HCL CHEMO INJECTION 100 MG/5ML
65.0000 mg/m2 | Freq: Once | INTRAVENOUS | Status: AC
  Administered 2014-12-02: 140 mg via INTRAVENOUS
  Filled 2014-12-02: qty 7

## 2014-12-02 MED ORDER — PALONOSETRON HCL INJECTION 0.25 MG/5ML
0.2500 mg | Freq: Once | INTRAVENOUS | Status: AC
  Administered 2014-12-02: 0.25 mg via INTRAVENOUS

## 2014-12-02 MED ORDER — PALONOSETRON HCL INJECTION 0.25 MG/5ML
INTRAVENOUS | Status: AC
  Filled 2014-12-02: qty 5

## 2014-12-02 NOTE — Patient Instructions (Signed)
[facility] Discharge Instructions for Patients Receiving Chemotherapy  Today you received the following chemotherapy agents: Irinotecan, Cisplatin  To help prevent nausea and vomiting after your treatment, we encourage you to take your nausea medication as prescribed by your physician.   If you develop nausea and vomiting that is not controlled by your nausea medication, call the clinic.   BELOW ARE SYMPTOMS THAT SHOULD BE REPORTED IMMEDIATELY:  *FEVER GREATER THAN 100.5 F  *CHILLS WITH OR WITHOUT FEVER  NAUSEA AND VOMITING THAT IS NOT CONTROLLED WITH YOUR NAUSEA MEDICATION  *UNUSUAL SHORTNESS OF BREATH  *UNUSUAL BRUISING OR BLEEDING  TENDERNESS IN MOUTH AND THROAT WITH OR WITHOUT PRESENCE OF ULCERS  *URINARY PROBLEMS  *BOWEL PROBLEMS  UNUSUAL RASH Items with * indicate a potential emergency and should be followed up as soon as possible.  Feel free to call the clinic you have any questions or concerns. The clinic phone number is [phone].  Please show the [facility] at check-in to the Emergency Department and triage nurse.  Irinotecan injection What is this medicine? IRINOTECAN (ir in oh TEE kan ) is a chemotherapy drug. It is used to treat colon and rectal cancer. This medicine may be used for other purposes; ask your health care provider or pharmacist if you have questions. COMMON BRAND NAME(S): Camptosar What should I tell my health care provider before I take this medicine? They need to know if you have any of these conditions: -blood disorders -dehydration -diarrhea -infection (especially a virus infection such as chickenpox, cold sores, or herpes) -liver disease -low blood counts, like low white cell, platelet, or red cell counts -recent or ongoing radiation therapy -an unusual or allergic reaction to irinotecan, sorbitol, other chemotherapy, other medicines, foods, dyes, or preservatives -pregnant or trying to get  pregnant -breast-feeding How should I use this medicine? This drug is given as an infusion into a vein. It is administered in a hospital or clinic by a specially trained health care professional. Talk to your pediatrician regarding the use of this medicine in children. Special care may be needed. Overdosage: If you think you have taken too much of this medicine contact a poison control center or emergency room at once. NOTE: This medicine is only for you. Do not share this medicine with others. What if I miss a dose? It is important not to miss your dose. Call your doctor or health care professional if you are unable to keep an appointment. What may interact with this medicine? Do not take this medicine with any of the following medications: -atazanavir -certain medicines for fungal infections like itraconazole and ketoconazole -St. John's Wort This medicine may also interact with the following medications: -dexamethasone -diuretics -laxatives -medicines for seizures like carbamazepine, mephobarbital, phenobarbital, phenytoin, primidone -medicines to increase blood counts like filgrastim, pegfilgrastim, sargramostim -prochlorperazine -vaccines This list may not describe all possible interactions. Give your health care provider a list of all the medicines, herbs, non-prescription drugs, or dietary supplements you use. Also tell them if you smoke, drink alcohol, or use illegal drugs. Some items may interact with your medicine. What should I watch for while using this medicine? Your condition will be monitored carefully while you are receiving this medicine. You will need important blood work done while you are taking this medicine. This drug may make you feel generally unwell. This is not uncommon, as chemotherapy can affect healthy cells as well as cancer cells. Report any side effects. Continue your course of treatment even  though you feel ill unless your doctor tells you to stop. In some  cases, you may be given additional medicines to help with side effects. Follow all directions for their use. You may get drowsy or dizzy. Do not drive, use machinery, or do anything that needs mental alertness until you know how this medicine affects you. Do not stand or sit up quickly, especially if you are an older patient. This reduces the risk of dizzy or fainting spells. Call your doctor or health care professional for advice if you get a fever, chills or sore throat, or other symptoms of a cold or flu. Do not treat yourself. This drug decreases your body's ability to fight infections. Try to avoid being around people who are sick. This medicine may increase your risk to bruise or bleed. Call your doctor or health care professional if you notice any unusual bleeding. Be careful brushing and flossing your teeth or using a toothpick because you may get an infection or bleed more easily. If you have any dental work done, tell your dentist you are receiving this medicine. Avoid taking products that contain aspirin, acetaminophen, ibuprofen, naproxen, or ketoprofen unless instructed by your doctor. These medicines may hide a fever. Do not become pregnant while taking this medicine. Women should inform their doctor if they wish to become pregnant or think they might be pregnant. There is a potential for serious side effects to an unborn child. Talk to your health care professional or pharmacist for more information. Do not breast-feed an infant while taking this medicine. What side effects may I notice from receiving this medicine? Side effects that you should report to your doctor or health care professional as soon as possible: -allergic reactions like skin rash, itching or hives, swelling of the face, lips, or tongue -low blood counts - this medicine may decrease the number of white blood cells, red blood cells and platelets. You may be at increased risk for infections and bleeding. -signs of infection  - fever or chills, cough, sore throat, pain or difficulty passing urine -signs of decreased platelets or bleeding - bruising, pinpoint red spots on the skin, black, tarry stools, blood in the urine -signs of decreased red blood cells - unusually weak or tired, fainting spells, lightheadedness -breathing problems -chest pain -diarrhea -feeling faint or lightheaded, falls -flushing, runny nose, sweating during infusion -mouth sores or pain -pain, swelling, redness or irritation where injected -pain, swelling, warmth in the leg -pain, tingling, numbness in the hands or feet -problems with balance, talking, walking -stomach cramps, pain -trouble passing urine or change in the amount of urine -vomiting as to be unable to hold down drinks or food -yellowing of the eyes or skin Side effects that usually do not require medical attention (report to your doctor or health care professional if they continue or are bothersome): -constipation -hair loss -headache -loss of appetite -nausea, vomiting -stomach upset This list may not describe all possible side effects. Call your doctor for medical advice about side effects. You may report side effects to FDA at 1-800-FDA-1088. Where should I keep my medicine? This drug is given in a hospital or clinic and will not be stored at home. NOTE: This sheet is a summary. It may not cover all possible information. If you have questions about this medicine, talk to your doctor, pharmacist, or health care provider.  2015, [employer]/Gold Standard. (2013-02-08 16:29:32)  Cisplatin injection What is this medicine? CISPLATIN (SIS pla tin) is a chemotherapy drug. It targets  fast dividing cells, like cancer cells, and causes these cells to die. This medicine is used to treat many types of cancer like bladder, ovarian, and testicular cancers. This medicine may be used for other purposes; ask your health care provider or pharmacist if you have questions. COMMON BRAND  NAME(S): Platinol, Platinol -AQ What should I tell my health care provider before I take this medicine? They need to know if you have any of these conditions: -blood disorders -hearing problems -kidney disease -recent or ongoing radiation therapy -an unusual or allergic reaction to cisplatin, carboplatin, other chemotherapy, other medicines, foods, dyes, or preservatives -pregnant or trying to get pregnant -breast-feeding How should I use this medicine? This drug is given as an infusion into a vein. It is administered in a hospital or clinic by a specially trained health care professional. Talk to your pediatrician regarding the use of this medicine in children. Special care may be needed. Overdosage: If you think you have taken too much of this medicine contact a poison control center or emergency room at once. NOTE: This medicine is only for you. Do not share this medicine with others. What if I miss a dose? It is important not to miss a dose. Call your doctor or health care professional if you are unable to keep an appointment. What may interact with this medicine? -dofetilide -foscarnet -medicines for seizures -medicines to increase blood counts like filgrastim, pegfilgrastim, sargramostim -probenecid -pyridoxine used with altretamine -rituximab -some antibiotics like amikacin, gentamicin, neomycin, polymyxin B, streptomycin, tobramycin -sulfinpyrazone -vaccines -zalcitabine Talk to your doctor or health care professional before taking any of these medicines: -acetaminophen -aspirin -ibuprofen -ketoprofen -naproxen This list may not describe all possible interactions. Give your health care provider a list of all the medicines, herbs, non-prescription drugs, or dietary supplements you use. Also tell them if you smoke, drink alcohol, or use illegal drugs. Some items may interact with your medicine. What should I watch for while using this medicine? Your condition will be  monitored carefully while you are receiving this medicine. You will need important blood work done while you are taking this medicine. This drug may make you feel generally unwell. This is not uncommon, as chemotherapy can affect healthy cells as well as cancer cells. Report any side effects. Continue your course of treatment even though you feel ill unless your doctor tells you to stop. In some cases, you may be given additional medicines to help with side effects. Follow all directions for their use. Call your doctor or health care professional for advice if you get a fever, chills or sore throat, or other symptoms of a cold or flu. Do not treat yourself. This drug decreases your body's ability to fight infections. Try to avoid being around people who are sick. This medicine may increase your risk to bruise or bleed. Call your doctor or health care professional if you notice any unusual bleeding. Be careful brushing and flossing your teeth or using a toothpick because you may get an infection or bleed more easily. If you have any dental work done, tell your dentist you are receiving this medicine. Avoid taking products that contain aspirin, acetaminophen, ibuprofen, naproxen, or ketoprofen unless instructed by your doctor. These medicines may hide a fever. Do not become pregnant while taking this medicine. Women should inform their doctor if they wish to become pregnant or think they might be pregnant. There is a potential for serious side effects to an unborn child. Talk to your health care professional  or pharmacist for more information. Do not breast-feed an infant while taking this medicine. Drink fluids as directed while you are taking this medicine. This will help protect your kidneys. Call your doctor or health care professional if you get diarrhea. Do not treat yourself. What side effects may I notice from receiving this medicine? Side effects that you should report to your doctor or health care  professional as soon as possible: -allergic reactions like skin rash, itching or hives, swelling of the face, lips, or tongue -signs of infection - fever or chills, cough, sore throat, pain or difficulty passing urine -signs of decreased platelets or bleeding - bruising, pinpoint red spots on the skin, black, tarry stools, nosebleeds -signs of decreased red blood cells - unusually weak or tired, fainting spells, lightheadedness -breathing problems -changes in hearing -gout pain -low blood counts - This drug may decrease the number of white blood cells, red blood cells and platelets. You may be at increased risk for infections and bleeding. -nausea and vomiting -pain, swelling, redness or irritation at the injection site -pain, tingling, numbness in the hands or feet -problems with balance, movement -trouble passing urine or change in the amount of urine Side effects that usually do not require medical attention (report to your doctor or health care professional if they continue or are bothersome): -changes in vision -loss of appetite -metallic taste in the mouth or changes in taste This list may not describe all possible side effects. Call your doctor for medical advice about side effects. You may report side effects to FDA at 1-800-FDA-1088. Where should I keep my medicine? This drug is given in a hospital or clinic and will not be stored at home. NOTE: This sheet is a summary. It may not cover all possible information. If you have questions about this medicine, talk to your doctor, pharmacist, or health care provider.  2015, Elsevier/Gold Standard. (2007-11-17 14:40:54)

## 2014-12-05 ENCOUNTER — Ambulatory Visit
Admission: RE | Admit: 2014-12-05 | Discharge: 2014-12-05 | Disposition: A | Discharge status: Home / Self Care | Payer: Federal, State, Local not specified - PPO | Source: Ambulatory Visit | Attending: Radiation Oncology | Admitting: Radiation Oncology

## 2014-12-05 ENCOUNTER — Other Ambulatory Visit: Payer: Federal, State, Local not specified - PPO

## 2014-12-05 ENCOUNTER — Ambulatory Visit: Payer: Federal, State, Local not specified - PPO | Admitting: Physician Assistant

## 2014-12-05 DIAGNOSIS — C3490 Malignant neoplasm of unspecified part of unspecified bronchus or lung: Secondary | ICD-10-CM | POA: Diagnosis not present

## 2014-12-06 ENCOUNTER — Other Ambulatory Visit: Payer: Self-pay | Admitting: Internal Medicine

## 2014-12-06 ENCOUNTER — Ambulatory Visit: Payer: Federal, State, Local not specified - PPO

## 2014-12-06 ENCOUNTER — Ambulatory Visit: Payer: Federal, State, Local not specified - PPO | Admitting: Radiation Oncology

## 2014-12-07 ENCOUNTER — Ambulatory Visit
Admission: RE | Admit: 2014-12-07 | Discharge: 2014-12-07 | Disposition: A | Discharge status: Home / Self Care | Payer: Federal, State, Local not specified - PPO | Source: Ambulatory Visit | Attending: Radiation Oncology | Admitting: Radiation Oncology

## 2014-12-07 DIAGNOSIS — C3490 Malignant neoplasm of unspecified part of unspecified bronchus or lung: Secondary | ICD-10-CM | POA: Diagnosis not present

## 2014-12-08 ENCOUNTER — Encounter: Payer: Self-pay | Admitting: Radiation Oncology

## 2014-12-08 ENCOUNTER — Ambulatory Visit
Admission: RE | Admit: 2014-12-08 | Discharge: 2014-12-08 | Disposition: A | Discharge status: Home / Self Care | Payer: Federal, State, Local not specified - PPO | Source: Ambulatory Visit | Attending: Radiation Oncology | Admitting: Radiation Oncology

## 2014-12-08 VITALS — BP 89/47 | HR 142 | Temp 98.1°F | Resp 22 | Ht 74.0 in | Wt 185.1 lb

## 2014-12-08 DIAGNOSIS — C3490 Malignant neoplasm of unspecified part of unspecified bronchus or lung: Secondary | ICD-10-CM | POA: Diagnosis not present

## 2014-12-08 NOTE — Progress Notes (Signed)
  Radiation Oncology         [phone] ________________________________  Name: Juana Montini MRN: [id number]  Date: 12/08/2014  DOB: 1960/09/08  Weekly Radiation Therapy Management  Diagnosis: Metastatic small cell lung cancer  Current Dose: 17.2 Gy     Planned Dose:  25 Gy directed at the thoracic spine area  Narrative . . . . . . . . The patient presents for routine under treatment assessment.                                   The patient is having esophageal symptoms. He has several medications for this issue                                 Set-up films were reviewed.                                 The chart was checked. Physical Findings. . .  height is 6\' 2"  (1.88 m) and weight is 185 lb 1.6 oz (83.961 kg). His oral temperature is 98.1 F (36.7 C). His blood pressure is 89/47 and his pulse is 142. His respiration is 22 and oxygen saturation is 100%. . The lungs are clear. The oral cavity is free of secondary infection. Patient continues to have a Foley catheter in. He has good strength in his lower extremities, proximal and distal. He reports being a little unsteady with walking Impression . . . . . . . The patient is tolerating radiation. Plan . . . . . . . . . . . . Continue treatment as planned.  ________________________________   Blair Promise, PhD, MD

## 2014-12-08 NOTE — Progress Notes (Signed)
Weekly rad txs T-spine 7 /25 completed  Has biafine not using as yet, c/o heartburn, offered gingerale ,after drinking increased heartburn,takes nexium and prilosec, c/o back  Pain from hard linac table, in w/c, very fatigued, nausea yesterday and vomited mango and pineapple, pain lower back,  Drinks boost Wt Readings from Last 3 Encounters:  11/23/14 200 lb 3.2 oz (90.81 kg)  11/23/14 200 lb (90.719 kg)  11/21/14 199 lb 8 oz (90.493 kg)   BP 97/67 mmHg  Pulse 95  Temp(Src) 98.1 F (36.7 C) (Oral)  Resp 20  Ht 6\' 2"  (1.88 m)  Wt 185 lb 1.6 oz (83.961 kg)  BMI 23.76 kg/m2  SpO2 100%  BP 89/47 mmHg  Pulse 142  Temp(Src) 98.1 F (36.7 C) (Oral)  Resp 22  Ht 6\' 2"  (1.88 m)  Wt 185 lb 1.6 oz (83.961 kg)  BMI 23.76 kg/m2  SpO2 100%

## 2014-12-09 ENCOUNTER — Other Ambulatory Visit: Payer: Self-pay | Admitting: Medical Oncology

## 2014-12-09 ENCOUNTER — Ambulatory Visit (HOSPITAL_BASED_OUTPATIENT_CLINIC_OR_DEPARTMENT_OTHER): Payer: Federal, State, Local not specified - PPO

## 2014-12-09 ENCOUNTER — Ambulatory Visit
Admission: RE | Admit: 2014-12-09 | Discharge: 2014-12-09 | Disposition: A | Discharge status: Home / Self Care | Payer: Federal, State, Local not specified - PPO | Source: Ambulatory Visit | Attending: Radiation Oncology | Admitting: Radiation Oncology

## 2014-12-09 ENCOUNTER — Ambulatory Visit: Payer: Federal, State, Local not specified - PPO

## 2014-12-09 ENCOUNTER — Encounter: Payer: Self-pay | Admitting: *Deleted

## 2014-12-09 ENCOUNTER — Other Ambulatory Visit (HOSPITAL_BASED_OUTPATIENT_CLINIC_OR_DEPARTMENT_OTHER): Payer: Federal, State, Local not specified - PPO

## 2014-12-09 ENCOUNTER — Ambulatory Visit (HOSPITAL_BASED_OUTPATIENT_CLINIC_OR_DEPARTMENT_OTHER): Payer: Federal, State, Local not specified - PPO | Admitting: Physician Assistant

## 2014-12-09 ENCOUNTER — Other Ambulatory Visit (HOSPITAL_COMMUNITY)
Admission: RE | Admit: 2014-12-09 | Discharge: 2014-12-09 | Disposition: A | Discharge status: Home / Self Care | Payer: Federal, State, Local not specified - PPO | Source: Ambulatory Visit | Attending: Internal Medicine | Admitting: Internal Medicine

## 2014-12-09 ENCOUNTER — Encounter: Payer: Self-pay | Admitting: Physician Assistant

## 2014-12-09 VITALS — BP 110/72 | HR 82 | Temp 97.8°F | Resp 18 | Ht 74.0 in | Wt 186.1 lb

## 2014-12-09 DIAGNOSIS — Z5111 Encounter for antineoplastic chemotherapy: Secondary | ICD-10-CM | POA: Diagnosis not present

## 2014-12-09 DIAGNOSIS — C3490 Malignant neoplasm of unspecified part of unspecified bronchus or lung: Secondary | ICD-10-CM | POA: Diagnosis not present

## 2014-12-09 DIAGNOSIS — C7A1 Malignant poorly differentiated neuroendocrine tumors: Secondary | ICD-10-CM

## 2014-12-09 DIAGNOSIS — C7951 Secondary malignant neoplasm of bone: Secondary | ICD-10-CM | POA: Insufficient documentation

## 2014-12-09 DIAGNOSIS — C7B8 Other secondary neuroendocrine tumors: Secondary | ICD-10-CM | POA: Diagnosis not present

## 2014-12-09 LAB — COMPREHENSIVE METABOLIC PANEL
ALT: 46 U/L (ref 0–53)
AST: 54 U/L — AB (ref 0–37)
Albumin: 3 g/dL — ABNORMAL LOW (ref 3.5–5.2)
Alkaline Phosphatase: 151 U/L — ABNORMAL HIGH (ref 39–117)
Anion gap: 9 (ref 5–15)
BUN: 18 mg/dL (ref 6–23)
CO2: 26 mmol/L (ref 19–32)
CREATININE: 0.59 mg/dL (ref 0.50–1.35)
Calcium: 8.1 mg/dL — ABNORMAL LOW (ref 8.4–10.5)
Chloride: 97 mmol/L (ref 96–112)
GFR calc Af Amer: >90 mL/min (ref >90)
GFR calc non Af Amer: >90 mL/min (ref >90)
GLUCOSE: 370 mg/dL — AB (ref 70–99)
Potassium: 3.8 mmol/L (ref 3.5–5.1)
Sodium: 132 mmol/L — ABNORMAL LOW (ref 135–145)
TOTAL PROTEIN: 6.6 g/dL (ref 6.0–8.3)
Total Bilirubin: 0.3 mg/dL (ref 0.3–1.2)

## 2014-12-09 LAB — MAGNESIUM: Magnesium: 1.4 mg/dL — ABNORMAL LOW (ref 1.5–2.5)

## 2014-12-09 LAB — CBC WITH DIFFERENTIAL/PLATELET
BASO%: 0.1 % (ref 0.0–2.0)
Basophils Absolute: 0 10*3/uL (ref 0.0–0.1)
EOS ABS: 0 10*3/uL (ref 0.0–0.5)
EOS%: 1.3 % (ref 0.0–7.0)
HCT: 27 % — ABNORMAL LOW (ref 38.4–49.9)
HEMOGLOBIN: 8.8 g/dL — AB (ref 13.0–17.1)
LYMPH%: 6.2 % — ABNORMAL LOW (ref 14.0–49.0)
MCH: 30.1 pg (ref 27.2–33.4)
MCHC: 32.7 g/dL (ref 32.0–36.0)
MCV: 92 fL (ref 79.3–98.0)
MONO#: 0.2 10*3/uL (ref 0.1–0.9)
MONO%: 4.7 % (ref 0.0–14.0)
NEUT#: 2.9 10*3/uL (ref 1.5–6.5)
NEUT%: 87.7 % — ABNORMAL HIGH (ref 39.0–75.0)
Platelets: 106 10*3/uL — ABNORMAL LOW (ref 140–400)
RBC: 2.94 10*6/uL — AB (ref 4.20–5.82)
RDW: 15.3 % — ABNORMAL HIGH (ref 11.0–14.6)
WBC: 3.3 10*3/uL — ABNORMAL LOW (ref 4.0–10.3)
lymph#: 0.2 10*3/uL — ABNORMAL LOW (ref 0.9–3.3)

## 2014-12-09 MED ORDER — PALONOSETRON HCL INJECTION 0.25 MG/5ML
INTRAVENOUS | Status: AC
  Filled 2014-12-09: qty 5

## 2014-12-09 MED ORDER — IRINOTECAN HCL CHEMO INJECTION 100 MG/5ML
65.0000 mg/m2 | Freq: Once | INTRAVENOUS | Status: AC
  Administered 2014-12-09: 140 mg via INTRAVENOUS
  Filled 2014-12-09: qty 7

## 2014-12-09 MED ORDER — ALUM & MAG HYDROXIDE-SIMETH 200-200-20 MG/5ML PO SUSP
30.0000 mL | Freq: Once | ORAL | Status: AC
  Administered 2014-12-09: 30 mL via ORAL
  Filled 2014-12-09: qty 30

## 2014-12-09 MED ORDER — SODIUM CHLORIDE 0.9 % IV SOLN
30.0000 mg/m2 | Freq: Once | INTRAVENOUS | Status: AC
  Administered 2014-12-09: 65 mg via INTRAVENOUS
  Filled 2014-12-09: qty 65

## 2014-12-09 MED ORDER — MAGNESIUM OXIDE 400 (241.3 MG) MG PO TABS: 400.0000 mg | ORAL_TABLET | Freq: Two times a day (BID) | ORAL | Status: DC

## 2014-12-09 MED ORDER — POTASSIUM CHLORIDE 2 MEQ/ML IV SOLN
Freq: Once | INTRAVENOUS | Status: AC
  Administered 2014-12-09: 11:00:00 via INTRAVENOUS
  Filled 2014-12-09: qty 10

## 2014-12-09 MED ORDER — SODIUM CHLORIDE 0.9 % IV SOLN
Freq: Once | INTRAVENOUS | Status: AC
  Administered 2014-12-09: 14:00:00 via INTRAVENOUS
  Filled 2014-12-09: qty 5

## 2014-12-09 MED ORDER — PALONOSETRON HCL INJECTION 0.25 MG/5ML
0.2500 mg | Freq: Once | INTRAVENOUS | Status: AC
  Administered 2014-12-09: 0.25 mg via INTRAVENOUS

## 2014-12-09 MED ORDER — SODIUM CHLORIDE 0.9 % IV SOLN
Freq: Once | INTRAVENOUS | Status: AC
  Administered 2014-12-09: 11:00:00 via INTRAVENOUS

## 2014-12-09 NOTE — Progress Notes (Unsigned)
Critical mg taken to [facility], Dr. Earlie Server nurse 1:10pm 12-09-14 sdd

## 2014-12-09 NOTE — Patient Instructions (Signed)
[facility] Discharge Instructions for Patients Receiving Chemotherapy  Today you received the following chemotherapy agents irinotecan and cisplatin  To help prevent nausea and vomiting after your treatment, we encourage you to take your nausea medication as directed by MD   If you develop nausea and vomiting that is not controlled by your nausea medication, call the clinic.   BELOW ARE SYMPTOMS THAT SHOULD BE REPORTED IMMEDIATELY:  *FEVER GREATER THAN 100.5 F  *CHILLS WITH OR WITHOUT FEVER  NAUSEA AND VOMITING THAT IS NOT CONTROLLED WITH YOUR NAUSEA MEDICATION  *UNUSUAL SHORTNESS OF BREATH  *UNUSUAL BRUISING OR BLEEDING  TENDERNESS IN MOUTH AND THROAT WITH OR WITHOUT PRESENCE OF ULCERS  *URINARY PROBLEMS  *BOWEL PROBLEMS  UNUSUAL RASH Items with * indicate a potential emergency and should be followed up as soon as possible.  Feel free to call the clinic you have any questions or concerns. The clinic phone number is [phone].  Please show the [facility] at check-in to the Emergency Department and triage nurse.

## 2014-12-09 NOTE — Progress Notes (Signed)
I left voice mail for pt to pick up mag oxide rx at his pharmacy

## 2014-12-09 NOTE — Progress Notes (Addendum)
[place] [phone]   Fax:([phone]) [place], MD [address] [place] [address]  DIAGNOSIS: Extensive stage, Stage IV (T2a, N1, M1b) small cell lung cancer diagnosed in July of 2015.  PRIOR THERAPY:  1) Systemic chemotherapy with carboplatin for AUC of 5 on day 1 and etoposide at 120 mg/M2 on days 1, 2 and 3 with Neulasta support on day 4. Status post 6 cycles.  2) prophylactic cranial irradiation under the care of Dr. Sondra Come. 3) palliative radiotherapy to the T7-spine completed on 09/06/2014.  CURRENT THERAPY: Systemic chemotherapy with cisplatin 30 mg/m2 and irinotecan 65 mg /m2 given on days 1 and 8 every 3 weeks. Status post day 1 of cycle #1.  INTERVAL HISTORY: Kerry Peck 54 y.o. male returns to the clinic today for follow up visit. His recent restaging CT scan showed evidence for disease progression. He is currently being treated with systemic chemotherapy with cisplatin and irinotecan. He is status post day 1 of cycle #1. He presents for day 8 of cycle #1. He tolerated day 1 of his chemotherapy with cisplatin and irinotecan without difficulty. He will complete his palliative radiation to the upper and lower thoracic spine on 12/13/2014. His back pain has improved. He presents in a wheelchair and has a foley catheter.He denied having any significant fever or chills, no nausea or vomiting. He denied having any significant weight loss or night sweats. The patient has no significant chest pain but continues to have shortness of breath with exertion with no cough or hemoptysis.   MEDICAL HISTORY: Past Medical History  Diagnosis Date  . Diabetes mellitus without complication   . Hypertension   . Smoking addiction   . Cancer     lung and liver  . Radiation 08/16/14-09/06/14    prophylatic cranial radiation 25 gray, chest, t-spine  35 gray  . Radiation 09/29/14-10/12/14    left hilar area 30  gray, right pelvis 30 gray, right lower rib cage area 30 gray  . Metastatic carcinoma to bone 11/23/2014    ALLERGIES:  has No Known Allergies.  MEDICATIONS:  Current Outpatient Prescriptions  Medication Sig Dispense Refill  . albuterol (PROVENTIL HFA;VENTOLIN HFA) 108 (90 BASE) MCG/ACT inhaler Inhale 2 puffs into the lungs every 6 (six) hours as needed for wheezing. 1 Inhaler 0  . aspirin EC 81 MG tablet Take 81 mg by mouth as directed. Take 1 tablet all days except Wednesday    . atorvastatin (LIPITOR) 40 MG tablet Take 40 mg by mouth daily.    . Blood Pressure Monitoring (5 SERIES BP MONITOR) DEVI     . calcium-vitamin D (OSCAL WITH D) 500-200 MG-UNIT per tablet Take 1 tablet by mouth daily with breakfast.     . ciprofloxacin (CIPRO) 500 MG tablet   0  . dexamethasone (DECADRON) 4 MG tablet TAKE 1 TABLET(4 MG) BY MOUTH FOUR TIMES DAILY 40 tablet 0  . emollient (BIAFINE) cream Apply topically as needed. (Patient taking differently: Apply 1 application topically as needed. For radiation itch.) 454 g 0  . esomeprazole (NEXIUM) 20 MG capsule Take 20 mg by mouth daily as needed (GERD).     . Fluticasone-Salmeterol (ADVAIR) 250-50 MCG/DOSE AEPB Inhale 1 puff into the lungs every 12 (twelve) hours.    . Insulin Glargine (LANTUS) 100 UNIT/ML Solostar Pen Inject 15 Units into the skin daily. 15 mL 0  . Insulin Pen Needle (PEN NEEDLES 3/16")  31G X 5 MM MISC Use daily with your Lantus injection 90 each 0  . metFORMIN (GLUCOPHAGE) 500 MG tablet Take 1,000 mg by mouth 2 (two) times daily with a meal.     . metoprolol tartrate (LOPRESSOR) 25 MG tablet Take 25 mg by mouth daily.     . Multiple Vitamins-Minerals (CENTRUM SILVER ADULT 50+ PO) Take 1 tablet by mouth daily.     . ondansetron (ZOFRAN) 8 MG tablet Take 8 mg by mouth every 8 (eight) hours as needed for nausea or vomiting. Disp. 20 tablets. 1 refill.  Called in to [facility] per Dr. Sondra Come on 11/24/14.    Marland Kitchen ONE TOUCH ULTRA TEST test strip 1 each  by Other route 3 (three) times daily.     . Oxycodone HCl 10 MG TABS Take 1-2 tablets (10-20 mg total) by mouth every 4 (four) hours as needed. 60 tablet 0  . OxyCODONE HCl ER 60 MG T12A Take 60 mg by mouth every 12 (twelve) hours. 60 tablet 0  . pantoprazole (PROTONIX) 40 MG tablet Take 40 mg by mouth daily.     . polyethylene glycol (MIRALAX / GLYCOLAX) packet Take 17 g by mouth daily. 14 each 0  . prochlorperazine (COMPAZINE) 10 MG tablet Take 1 tablet (10 mg total) by mouth every 6 (six) hours as needed for nausea or vomiting. 30 tablet 0  . pyridOXINE (VITAMIN B-6) 100 MG tablet Take 100 mg by mouth daily.    Marland Kitchen senna (SENOKOT) 8.6 MG TABS tablet Take 1 tablet (8.6 mg total) by mouth 2 (two) times daily. 120 each 0  . sucralfate (CARAFATE) 1 GM/10ML suspension Take 10 mLs (1 g total) by mouth 4 (four) times daily -  with meals and at bedtime. (Patient taking differently: Take 1 g by mouth 4 (four) times daily -  with meals and at bedtime. Marland Kitchen) 420 mL 1  . tamsulosin (FLOMAX) 0.4 MG CAPS capsule Take 1 capsule (0.4 mg total) by mouth daily. 30 capsule 0  . vitamin C (ASCORBIC ACID) 500 MG tablet Take 500 mg by mouth daily.    Marland Kitchen amLODipine (NORVASC) 10 MG tablet Take 10 mg by mouth daily.    . fenofibrate 160 MG tablet Take 160 mg by mouth daily.     No current facility-administered medications for this visit.   Facility-Administered Medications Ordered in Other Visits  Medication Dose Route Frequency Provider Last Rate Last Dose  . morphine 4 MG/ML injection 2 mg  2 mg Intravenous Q30 min PRN Susanne Borders, NP   2 mg at 11/23/14 1345    REVIEW OF SYSTEMS:  Constitutional: positive for fatigue and weight loss Eyes: negative Ears, nose, mouth, throat, and face: negative Respiratory: positive for dyspnea on exertion Cardiovascular: negative Gastrointestinal: negative Genitourinary:negative Integument/breast: negative Hematologic/lymphatic: negative Musculoskeletal:positive for back pain  and bone pain Neurological: negative Behavioral/Psych: negative Endocrine: negative Allergic/Immunologic: negative   PHYSICAL EXAMINATION: General appearance: alert, cooperative and no distress Head: Normocephalic, without obvious abnormality, atraumatic Neck: no adenopathy, no JVD, supple, symmetrical, trachea midline and thyroid not enlarged, symmetric, no tenderness/mass/nodules Lymph nodes: Cervical, supraclavicular, and axillary nodes normal. Resp: clear to auscultation bilaterally Back: symmetric, no curvature. ROM normal. No CVA tenderness. Cardio: regular rate and rhythm, S1, S2 normal, no murmur, click, rub or gallop GI: soft, non-tender; bowel sounds normal; no masses,  no organomegaly Extremities: extremities normal, atraumatic, no cyanosis or edema Neurologic: Alert and oriented X 3, normal strength and tone. Normal symmetric reflexes. Normal coordination  and gait  ECOG PERFORMANCE STATUS: 2 - Symptomatic, <50% confined to bed  Blood pressure 110/72, pulse 82, temperature 97.8 F (36.6 C), temperature source Oral, resp. rate 18, height 6\' 2"  (1.88 m), weight 186 lb 1.6 oz (84.414 kg), SpO2 99 %.  LABORATORY DATA: Lab Results  Component Value Date   WBC 3.3* 12/09/2014   HGB 8.8* 12/09/2014   HCT 27.0* 12/09/2014   MCV 92.0 12/09/2014   PLT 106* 12/09/2014      Chemistry      Component Value Date/Time   NA 137 12/02/2014 1114   NA 133* 12/01/2014 1326   K 4.2 12/02/2014 1114   K 3.7 12/01/2014 1326   CL 97 12/01/2014 1326   CO2 22 12/02/2014 1114   CO2 24 12/01/2014 1326   BUN 18.4 12/02/2014 1114   BUN 25* 12/01/2014 1326   CREATININE 0.8 12/02/2014 1114   CREATININE 0.77 12/01/2014 1326      Component Value Date/Time   CALCIUM 9.5 12/02/2014 1114   CALCIUM 8.5 12/01/2014 1326   ALKPHOS 253* 12/02/2014 1114   ALKPHOS 258* 12/01/2014 1326   AST 92* 12/02/2014 1114   AST 102* 12/01/2014 1326   ALT 31 12/02/2014 1114   ALT 33 12/01/2014 1326   BILITOT  1.14 12/02/2014 1114   BILITOT 1.4* 12/01/2014 1326       RADIOGRAPHIC STUDIES: Ct Chest W Contrast  11/16/2014   CLINICAL DATA:  Lung cancer restaging. Left-sided chest pain for 1 week. Diagnosed in 2015. Chemotherapy and radiation therapy complete. Small cell type. Diabetes. Hypertension.  EXAM: CT CHEST, ABDOMEN, AND PELVIS WITH CONTRAST  TECHNIQUE: Multidetector CT imaging of the chest, abdomen and pelvis was performed following the standard protocol during bolus administration of intravenous contrast.  CONTRAST:  120mL OMNIPAQUE IOHEXOL 300 MG/ML  SOLN  COMPARISON:  09/09/2014  FINDINGS: CT CHEST FINDINGS  Mediastinum/Nodes: No supraclavicular adenopathy. Extensive aortic atherosclerosis. Normal heart size, without pericardial effusion. Multivessel coronary artery atherosclerosis. No central pulmonary embolism, on this non-dedicated study. No mediastinal adenopathy. Decrease in left hilar adenopathy. 1.4 x 1.4 cm node on image 31 is decreased from 2.5 x 2.2 cm on the prior. No right hilar adenopathy.  Lungs/Pleura: Trace left pleural fluid is new. Secretions along the trachea. Moderate centrilobular emphysema.  Left lower lobe spiculated pulmonary nodule measures 1.8 x 1.6 cm on image 37 and is felt to be similar to 2.2 x 1.4 cm on the prior.  Minimal nodularity along the left major fissure on image 34 is new. Nonspecific.  Musculoskeletal: Progressive osseous metastasis. Posterior vertebral body tumor at T4 and T10 encroach upon the ventral canal. Example sagittal image 61. Axial images 43 and 67.  CT ABDOMEN PELVIS FINDINGS  Hepatobiliary: Development of hepatic metastasis. Example right hepatic lobe 2.1 cm lesion on image 64.  Lateral segment left liver lobe 2.3 cm lesion on image 53. Resolved hepatic steatosis.  Cholelithiasis without acute cholecystitis or biliary ductal dilatation.  Pancreas: Hypo attenuating lesion in the pancreatic tail measures 11 mm on image 65 and may have increased minimally  from the prior exam. Suspicious for a metastasis.  Spleen: Normal  Adrenals/Urinary Tract: Normal adrenal glands. Too small to characterize interpolar left renal lesion. Normal right kidney, without hydronephrosis. Possible bladder wall thickening, mild.  Stomach/Bowel: Normal stomach, without wall thickening. Normal colon, appendix, and terminal ileum. Normal small bowel.  Vascular/Lymphatic: Advanced aortic and branch vessel atherosclerosis. Developing retroperitoneal adenopathy, including a left periaortic 1.4 cm node on image 75. Likely  necrotic portacaval node at 1.7 cm on image 66. No pelvic adenopathy.  Reproductive: Normal prostate.  Other: No significant free fluid. No evidence of omental or peritoneal disease. Tiny fat containing bilateral inguinal hernias.  Musculoskeletal: Widespread osseous metastasis. Progressive. Right iliac lesion is enlarged with cortical destruction/pathologic fracture. Ill-defined lytic metastasis throughout the sacrum is progressive. Soft tissue component extends anterior to the left side of the sacrum including on image 103. Markedly progressive thoracolumbar vertebral metastatic disease. Tumor involvement involving the posterior aspect of L2 extends minimally into the ventral canal. Multiple compression deformities. Similar.  IMPRESSION: CT CHEST IMPRESSION  1. Similar left lower lobe pulmonary nodule with decrease in left hilar adenopathy. 2. Progressive osseous metastasis. Tumor canal encroachment involving the thoracic spine. Consider further evaluation with pre and post contrast thoracic spine MRI. 3.  Atherosclerosis, including within the coronary arteries. 4. New trace left pleural fluid.  CT ABDOMEN AND PELVIS IMPRESSION  1. Development of hepatic metastasis. 2. Developing nodal metastasis within the upper abdomen. 3. Progressive osseous metastasis. Lumbar vertebral canal encroachment would also be better evaluated with pre and post contrast lumbar spine MRI. 4. Hypo  attenuating pancreatic lesion is suspicious for metastasis. Similar to minimally progressive. 5. Cholelithiasis. 6. Possible mild bladder wall thickening. Correlate with symptoms to suggest cystitis or bladder outlet obstruction.   Electronically Signed   By: Abigail Miyamoto M.D.   On: 11/16/2014 10:56   Mr Thoracic Spine W Wo Contrast  11/26/2014   CLINICAL DATA:  Known spinal metastatic disease. Unable to bear weight. Urinary retention. Severe pain.  EXAM: MRI THORACIC SPINE WITHOUT AND WITH CONTRAST  TECHNIQUE: Multiplanar and multiecho pulse sequences of the thoracic spine were obtained without and with intravenous contrast.  CONTRAST:  62mL MULTIHANCE GADOBENATE DIMEGLUMINE 529 MG/ML IV SOLN  COMPARISON:  11/23/2014  FINDINGS: Sagittal images proceed from left to right, contrary to our normal convention.  No change is appreciated since the study of 3 days ago. There is diffuse marrow involvement by metastatic tumor throughout the region image to including the sternum, ribs and spine. At the T1-2 level, there is extraosseous tumor within the left side of the spinal canal which displaces the cord towards the right and could cause cord compressive symptoms.  At T2-3, there is a small amount of epidural tumor on the right which contacts the cord but does not appear to cause cord compression.  At T8, there is some ventral epidural tumor that contacts the ventral aspect of the cord but does not appear to cause cord compression.  At T9, there is ventral epidural tumor that contacts the ventral aspect of the cord and may deform the cord slightly on the left.  At T10, there is a small amount of ventral epidural tumor without cord compression.  Partial compression fractures are present at multiple levels, unchanged since the previous study. These include T3, T8, T9, T10, T11 and T12.  I do not identify any intra medullary metastatic disease in the region.  IMPRESSION: Please note sagittal images proceed from left to  right, contrary to our normal convention.  No change since the study of 3 days ago. There could be potential for some cord compressive symptoms based on the disease at T1-2 and T9.  Critical Value/emergent results were called by telephone at the time of interpretation on 11/26/2014 at 2:42 pm to Dr. Phillips Climes , who verbally acknowledged these results.   Electronically Signed   By: Nelson Chimes M.D.   On: 11/26/2014 14:43  Mr Thoracic Spine W Wo Contrast  11/24/2014   CLINICAL DATA:  Metastatic small cell cancer of the lung. Left-sided chest pain. Spinal canal encroachment by tumor on CT scan of the chest dated 11/16/2014  EXAM: MRI THORACIC SPINE WITHOUT AND WITH CONTRAST  TECHNIQUE: Multiplanar and multiecho pulse sequences of the thoracic spine were obtained without and with intravenous contrast.  CONTRAST:  28mL MULTIHANCE GADOBENATE DIMEGLUMINE 529 MG/ML IV SOLN  COMPARISON:  CT scan dated 11/16/2014  FINDINGS: Tumor infiltrates every thoracic vertebra except T7. Epidural tumor extends into the the left side of the spinal canal at T1-2 and deviates the spinal cord to the right. Tumor extends into the left neural foramina at T1-2 and T2-3. There is extensive tumor in the posterior elements of T3 and T4. There pathologic compression fractures of T3, T8, T9, and T11 and T12.  The posterior margins of T3 and T8 and T9 protrude into the spinal canal. Tumor encroaches on the left neural foramen at T9-10.  Note is made of numerous metastatic lesions in the liver.  IMPRESSION: 1. Diffuse metastatic disease throughout the entire thoracic spine with the exception of sparing of the T7 vertebra. 2. Extensive epidural tumor on the left at T2-3 which deviates spinal cord to the right. 3. Multiple pathologic fractures with slight protrusions of the posterior aspects of T3, T8 and T9 into the spinal canal with extension into the left neural foramen at T9-10.   Electronically Signed   By: Lorriane Shire M.D.   On:  11/24/2014 08:25   Mr Lumbar Spine W Wo Contrast  12/01/2014   CLINICAL DATA:  54 year old male with metastatic small cell lung cancer. Increased back pain. Subsequent encounter.  EXAM: MRI LUMBAR SPINE WITHOUT AND WITH CONTRAST  TECHNIQUE: Multiplanar and multiecho pulse sequences of the lumbar spine were obtained without and with intravenous contrast.  CONTRAST:  18 mL MultiHance.  COMPARISON:  Thoracic spine MRI 11/26/2014 and earlier  FINDINGS: Diffuse osseous metastatic disease throughout the visualized lumbar spine, lower thoracic spine, sacrum, and pelvis.  Same numbering system used as on the recent thoracic studies. By this designation the L5 level is sacralized.  Diffuse lumbar pathologic compression fractures. Lumbar extraosseous/epidural tumor at L1, L3, L4, L5. Expansile sacral tumor with epidural and foraminal involvement bilaterally at S1. The epidural tumor contributes to moderate or severe spinal stenosis from L3 to S1.  Large expansile tumor with extraosseous extension in the medial right iliac bone (series 12, image 47).  No abnormal signal in the visualized lower thoracic spinal cord. The conus medullaris is at T12-L1. No thickening or abnormal enhancement of the cauda equina nerve roots to suggest intradural tumor at this time. However there is dural tumor suspected from the L4 level inferiorly (see series 11, image 8).  IMPRESSION: 1. Diffuse osseous metastatic disease with no visible level spared. 2. 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 No lumbar intradural tumor identified. 3. 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   Electronically Signed   By: Genevie Ann M.D.   On: 12/01/2014 21:10   Ct Abdomen Pelvis W Contrast  11/16/2014   CLINICAL DATA:  Lung cancer restaging. Left-sided chest pain for 1 week. Diagnosed in 2015. Chemotherapy and radiation therapy complete. Small cell type. Diabetes.  Hypertension.  EXAM: CT CHEST, ABDOMEN, AND PELVIS WITH CONTRAST  TECHNIQUE: Multidetector CT imaging of the chest, abdomen and pelvis was performed following the  standard protocol during bolus administration of intravenous contrast.  CONTRAST:  122mL OMNIPAQUE IOHEXOL 300 MG/ML  SOLN  COMPARISON:  09/09/2014  FINDINGS: CT CHEST FINDINGS  Mediastinum/Nodes: No supraclavicular adenopathy. Extensive aortic atherosclerosis. Normal heart size, without pericardial effusion. Multivessel coronary artery atherosclerosis. No central pulmonary embolism, on this non-dedicated study. No mediastinal adenopathy. Decrease in left hilar adenopathy. 1.4 x 1.4 cm node on image 31 is decreased from 2.5 x 2.2 cm on the prior. No right hilar adenopathy.  Lungs/Pleura: Trace left pleural fluid is new. Secretions along the trachea. Moderate centrilobular emphysema.  Left lower lobe spiculated pulmonary nodule measures 1.8 x 1.6 cm on image 37 and is felt to be similar to 2.2 x 1.4 cm on the prior.  Minimal nodularity along the left major fissure on image 34 is new. Nonspecific.  Musculoskeletal: Progressive osseous metastasis. Posterior vertebral body tumor at T4 and T10 encroach upon the ventral canal. Example sagittal image 61. Axial images 43 and 67.  CT ABDOMEN PELVIS FINDINGS  Hepatobiliary: Development of hepatic metastasis. Example right hepatic lobe 2.1 cm lesion on image 64.  Lateral segment left liver lobe 2.3 cm lesion on image 53. Resolved hepatic steatosis.  Cholelithiasis without acute cholecystitis or biliary ductal dilatation.  Pancreas: Hypo attenuating lesion in the pancreatic tail measures 11 mm on image 65 and may have increased minimally from the prior exam. Suspicious for a metastasis.  Spleen: Normal  Adrenals/Urinary Tract: Normal adrenal glands. Too small to characterize interpolar left renal lesion. Normal right kidney, without hydronephrosis. Possible bladder wall thickening, mild.  Stomach/Bowel: Normal  stomach, without wall thickening. Normal colon, appendix, and terminal ileum. Normal small bowel.  Vascular/Lymphatic: Advanced aortic and branch vessel atherosclerosis. Developing retroperitoneal adenopathy, including a left periaortic 1.4 cm node on image 75. Likely necrotic portacaval node at 1.7 cm on image 66. No pelvic adenopathy.  Reproductive: Normal prostate.  Other: No significant free fluid. No evidence of omental or peritoneal disease. Tiny fat containing bilateral inguinal hernias.  Musculoskeletal: Widespread osseous metastasis. Progressive. Right iliac lesion is enlarged with cortical destruction/pathologic fracture. Ill-defined lytic metastasis throughout the sacrum is progressive. Soft tissue component extends anterior to the left side of the sacrum including on image 103. Markedly progressive thoracolumbar vertebral metastatic disease. Tumor involvement involving the posterior aspect of L2 extends minimally into the ventral canal. Multiple compression deformities. Similar.  IMPRESSION: CT CHEST IMPRESSION  1. Similar left lower lobe pulmonary nodule with decrease in left hilar adenopathy. 2. Progressive osseous metastasis. Tumor canal encroachment involving the thoracic spine. Consider further evaluation with pre and post contrast thoracic spine MRI. 3.  Atherosclerosis, including within the coronary arteries. 4. New trace left pleural fluid.  CT ABDOMEN AND PELVIS IMPRESSION  1. Development of hepatic metastasis. 2. Developing nodal metastasis within the upper abdomen. 3. Progressive osseous metastasis. Lumbar vertebral canal encroachment would also be better evaluated with pre and post contrast lumbar spine MRI. 4. Hypo attenuating pancreatic lesion is suspicious for metastasis. Similar to minimally progressive. 5. Cholelithiasis. 6. Possible mild bladder wall thickening. Correlate with symptoms to suggest cystitis or bladder outlet obstruction.   Electronically Signed   By: Abigail Miyamoto M.D.    On: 11/16/2014 10:56    ASSESSMENT AND PLAN: this is a very pleasant 54 years old white male recently diagnosed with extensive stage small cell lung cancer status post 6 cycles of systemic chemotherapy with carboplatin and etoposide with partial response. This was followed by prophylactic cranial irradiation in addition to palliative radiotherapy  to the T7 thoracic spine and the chest. The recent CT scan of the Chest, Abdomen and pelvis showed evidence for further disease progression with progressive osseous metastasis and tumor canal encroachment involving the thoracic spine as well as development of hepatic metastasis and nodal metastasis within the upper abdomen. He will complete palliative radiation to the upper and lower thoracic spine on 12/13/2014. He is currently being treated with systemic chemotherapy with cisplatin and irinotecan on days 1 and 8 every 3 weeks. He is status post day 1 of cycle #1. He tolerated this first treatment well. The patient was discussed with and also seen by Dr. Julien Nordmann.  He will proceed with day 8 of cycle #1 today as scheduled. He will follow up in 2 week, prior to the start of cycle #2. For pain management, the patient will continue on OxyContin and oxycodone for breakthrough pain. I discussed with him treatment with long acting fentanyl patch but the patient declined this option. The patient was advised to call immediately if he has any concerning symptoms in the interval. The patient voices understanding of current disease status and treatment options and is in agreement with the current care plan.  All questions were answered. The patient knows to call the clinic with any problems, questions or concerns. We can certainly see the patient much sooner if necessary.  Carlton Adam, PA-C 12/09/2014  ADDENDUM: Hematology/Oncology Attending: I had a face to face encounter with the patient. I recommended his care plan. This is a very pleasant 54 years old  white male with extensive stage small cell lung cancer who is currently undergoing second line chemotherapy with cisplatin and irinotecan status post day 1 of the first cycle. The patient tolerated the first week of his treatment fairly well with no significant adverse effects. He is also currently undergoing palliative radiotherapy to the upper and lower thoracic spine lesions and this is expected to be completed next week. I recommended for the patient to proceed with day 8 of the first cycle as a scheduled today. He would come back for follow-up visit in 2 weeks for reevaluation before starting cycle #2. The patient was advised to call immediately if he has any concerning symptoms in the interval.  Disclaimer: This note was dictated with voice recognition software. Similar sounding words can inadvertently be transcribed and may not be corrected upon review. [name], MD 12/12/2014

## 2014-12-11 NOTE — Patient Instructions (Signed)
Continue labs and chemotherapy as scheduled Complete your radiation as scheduled Follow up in 2 weeks, prior to the start of your next cycle of chemotherapy

## 2014-12-12 ENCOUNTER — Ambulatory Visit: Payer: Federal, State, Local not specified - PPO

## 2014-12-12 ENCOUNTER — Ambulatory Visit
Admission: RE | Admit: 2014-12-12 | Discharge: 2014-12-12 | Disposition: A | Discharge status: Home / Self Care | Payer: Federal, State, Local not specified - PPO | Source: Ambulatory Visit | Attending: Radiation Oncology | Admitting: Radiation Oncology

## 2014-12-12 DIAGNOSIS — C3490 Malignant neoplasm of unspecified part of unspecified bronchus or lung: Secondary | ICD-10-CM | POA: Diagnosis not present

## 2014-12-13 ENCOUNTER — Encounter: Payer: Self-pay | Admitting: Radiation Oncology

## 2014-12-13 ENCOUNTER — Ambulatory Visit
Admission: RE | Admit: 2014-12-13 | Discharge: 2014-12-13 | Disposition: A | Discharge status: Home / Self Care | Payer: Federal, State, Local not specified - PPO | Source: Ambulatory Visit | Attending: Radiation Oncology | Admitting: Radiation Oncology

## 2014-12-13 ENCOUNTER — Ambulatory Visit: Payer: Federal, State, Local not specified - PPO

## 2014-12-13 VITALS — BP 137/121 | HR 81 | Temp 98.5°F | Resp 16 | Ht 74.0 in | Wt 179.9 lb

## 2014-12-13 DIAGNOSIS — C3490 Malignant neoplasm of unspecified part of unspecified bronchus or lung: Secondary | ICD-10-CM | POA: Diagnosis not present

## 2014-12-13 MED ORDER — FLUCONAZOLE 100 MG PO TABS: 100.0000 mg | ORAL_TABLET | Freq: Every day | ORAL | Status: DC

## 2014-12-13 NOTE — Progress Notes (Signed)
Finch Costanzo has completed treatment to his upper and lower t spine.  He reports pain at 4/10 in his lower back/buttucks area and with swallowing.  He reports the pain with swallowing is in the center of his chest.  He is taking carafate 4 times a day. He continues to take oxycontin 60 mg twice a day and oxycodone 10 mg q 4 hours in between.  He continues to have a foley catheter and will have it removed tomorrow.  He report constipation but had a bowel movement this morning.  He reports walking with a cane sometimes at home but otherwise in a wheelchair. He has lost 7 lbs since last week and reports a very poor appetite.  He vomited in the exam room today.    BP 137/121 mmHg  Pulse 81  Temp(Src) 98.5 F (36.9 C) (Oral)  Resp 16  Ht '6\' 2"'$  (1.88 m)  Wt 179 lb 14.4 oz (81.602 kg)  BMI 23.09 kg/m2  SpO2 100%

## 2014-12-13 NOTE — Progress Notes (Signed)
  Radiation Oncology         ([phone]) [phone] ________________________________  Name: Kerry Peck MRN: [id number]  Date: 12/13/2014  DOB: 1961-04-06  Weekly Radiation Therapy Management  Diagnosis: Metastatic small cell lung cancer  Current Dose: 25 Gy     Planned Dose:  25 Gy  Narrative . . . . . . . . The patient presents for routine under treatment assessment.                                   The patient continues to have back pain but overall somewhat improved. He does have significant esophageal symptoms. He continues on Carafate and his prescription pain medications. Patient is scheduled to have his Foley catheter removed tomorrow with the voiding test later in the day                                 Set-up films were reviewed.                                 The chart was checked. Physical Findings. . .  height is '6\' 2"'$  (1.88 m) and weight is 179 lb 14.4 oz (81.602 kg). His oral temperature is 98.5 F (36.9 C). His blood pressure is 137/121 and his pulse is 81. His respiration is 16 and oxygen saturation is 100%. . The lungs are clear. The heart has a regular rhythm and rate. The abdomen is soft and nontender with normal bowel sounds. Patient does have a candidial infection along the soft palate region. Impression . . . . . . . The patient is tolerating radiation. Plan . . . . . . . . . . . . Routine follow-up in one month. The patient will continue on chemotherapy. He is been given a prescription for Diflucan and will start this medication this evening.  ________________________________   Blair Promise, PhD, MD

## 2014-12-14 ENCOUNTER — Telehealth: Payer: Self-pay | Admitting: Oncology

## 2014-12-14 ENCOUNTER — Ambulatory Visit: Payer: Federal, State, Local not specified - PPO

## 2014-12-14 ENCOUNTER — Telehealth: Payer: Self-pay

## 2014-12-14 NOTE — Telephone Encounter (Signed)
Family called to clarify which medication he is to stop, she had received a call from Santiago Glad - Dr Everardo Beals nurse. S/w karen and she will call sister back to clarify that pt needs to stop lipitor while on diflucan and to restart when he finishes diflucan. Santiago Glad will call sister back to clarify.

## 2014-12-14 NOTE — Telephone Encounter (Signed)
Called Kastiel and advised him to not take Lipitor while taking difulcan.  Cylas was not sure what Lipitor was and said his sister, [name], has been managing his medications.  He would like [place] to call her.  Called [name] and left a message requested a return call.

## 2014-12-14 NOTE — Telephone Encounter (Signed)
Talked to Kerry Peck's sister, Kerry Peck.  Advised her to have Kerry Peck not take his liptor while he is taking difucan.  He can restart the lipitor when he finishes the diflucan.  Kerry Peck verbalized agreement and understanding.  She also said Kerry Peck had his foley catheter removed today.  He is having a lot of sore throat pain and is not able to swallow.  She is worried about him being able to urinate with not drinking a lot of fluids.  Advised her that the diflucan will help with the sore throat.  Will continue to follow up.

## 2014-12-15 ENCOUNTER — Ambulatory Visit: Payer: Federal, State, Local not specified - PPO

## 2014-12-16 ENCOUNTER — Ambulatory Visit (HOSPITAL_BASED_OUTPATIENT_CLINIC_OR_DEPARTMENT_OTHER): Payer: Federal, State, Local not specified - PPO | Admitting: Nurse Practitioner

## 2014-12-16 ENCOUNTER — Other Ambulatory Visit: Payer: Self-pay | Admitting: *Deleted

## 2014-12-16 ENCOUNTER — Other Ambulatory Visit (HOSPITAL_BASED_OUTPATIENT_CLINIC_OR_DEPARTMENT_OTHER): Payer: Federal, State, Local not specified - PPO

## 2014-12-16 VITALS — BP 100/59 | HR 107 | Temp 98.8°F | Resp 16

## 2014-12-16 DIAGNOSIS — T451X5A Adverse effect of antineoplastic and immunosuppressive drugs, initial encounter: Secondary | ICD-10-CM

## 2014-12-16 DIAGNOSIS — G893 Neoplasm related pain (acute) (chronic): Secondary | ICD-10-CM

## 2014-12-16 DIAGNOSIS — C3490 Malignant neoplasm of unspecified part of unspecified bronchus or lung: Secondary | ICD-10-CM

## 2014-12-16 DIAGNOSIS — K1231 Oral mucositis (ulcerative) due to antineoplastic therapy: Secondary | ICD-10-CM

## 2014-12-16 DIAGNOSIS — R531 Weakness: Secondary | ICD-10-CM

## 2014-12-16 DIAGNOSIS — E860 Dehydration: Secondary | ICD-10-CM | POA: Diagnosis not present

## 2014-12-16 DIAGNOSIS — C7A1 Malignant poorly differentiated neuroendocrine tumors: Secondary | ICD-10-CM

## 2014-12-16 DIAGNOSIS — R630 Anorexia: Secondary | ICD-10-CM

## 2014-12-16 DIAGNOSIS — R112 Nausea with vomiting, unspecified: Secondary | ICD-10-CM

## 2014-12-16 DIAGNOSIS — E8809 Other disorders of plasma-protein metabolism, not elsewhere classified: Secondary | ICD-10-CM

## 2014-12-16 DIAGNOSIS — C7B8 Other secondary neuroendocrine tumors: Secondary | ICD-10-CM | POA: Diagnosis not present

## 2014-12-16 DIAGNOSIS — D61810 Antineoplastic chemotherapy induced pancytopenia: Secondary | ICD-10-CM

## 2014-12-16 LAB — CBC WITH DIFFERENTIAL/PLATELET
BASO%: 0 % (ref 0.0–2.0)
Basophils Absolute: 0 10*3/uL (ref 0.0–0.1)
EOS%: 1.4 % (ref 0.0–7.0)
Eosinophils Absolute: 0 10*3/uL (ref 0.0–0.5)
HEMATOCRIT: 27.2 % — AB (ref 38.4–49.9)
HGB: 9.2 g/dL — ABNORMAL LOW (ref 13.0–17.1)
LYMPH%: 14.2 % (ref 14.0–49.0)
MCH: 30.8 pg (ref 27.2–33.4)
MCHC: 33.8 g/dL (ref 32.0–36.0)
MCV: 91 fL (ref 79.3–98.0)
MONO#: 0.2 10*3/uL (ref 0.1–0.9)
MONO%: 12.8 % (ref 0.0–14.0)
NEUT#: 1 10*3/uL — ABNORMAL LOW (ref 1.5–6.5)
NEUT%: 71.6 % (ref 39.0–75.0)
Platelets: 83 10*3/uL — ABNORMAL LOW (ref 140–400)
RBC: 2.99 10*6/uL — AB (ref 4.20–5.82)
RDW: 14.5 % (ref 11.0–14.6)
WBC: 1.4 10*3/uL — AB (ref 4.0–10.3)
lymph#: 0.2 10*3/uL — ABNORMAL LOW (ref 0.9–3.3)

## 2014-12-16 LAB — COMPREHENSIVE METABOLIC PANEL (CC13)
ALK PHOS: 186 U/L — AB (ref 40–150)
ALT: 41 U/L (ref 0–55)
AST: 22 U/L (ref 5–34)
Albumin: 2.8 g/dL — ABNORMAL LOW (ref 3.5–5.0)
Anion Gap: 19 mEq/L — ABNORMAL HIGH (ref 3–11)
BILIRUBIN TOTAL: 0.68 mg/dL (ref 0.20–1.20)
BUN: 20.9 mg/dL (ref 7.0–26.0)
CO2: 19 mEq/L — ABNORMAL LOW (ref 22–29)
Calcium: 8.7 mg/dL (ref 8.4–10.4)
Chloride: 101 mEq/L (ref 98–109)
Creatinine: 0.7 mg/dL (ref 0.7–1.3)
EGFR: >90 mL/min/{1.73_m2} (ref >90)
Glucose: 205 mg/dl — ABNORMAL HIGH (ref 70–140)
POTASSIUM: 3.8 meq/L (ref 3.5–5.1)
Sodium: 139 mEq/L (ref 136–145)
TOTAL PROTEIN: 6.7 g/dL (ref 6.4–8.3)

## 2014-12-16 LAB — MAGNESIUM (CC13): MAGNESIUM: 1.6 mg/dL (ref 1.5–2.5)

## 2014-12-16 MED ORDER — ONDANSETRON HCL 8 MG PO TABS
ORAL_TABLET | ORAL | Status: AC
  Filled 2014-12-16: qty 1

## 2014-12-16 MED ORDER — OXYCODONE HCL 10 MG PO TABS: 10.0000 mg | ORAL_TABLET | ORAL | Status: DC | PRN

## 2014-12-16 MED ORDER — MAGIC MOUTHWASH W/LIDOCAINE: 5.0000 mL | Freq: Four times a day (QID) | ORAL | Status: DC | PRN

## 2014-12-16 MED ORDER — SODIUM CHLORIDE 0.9 % IV SOLN
Freq: Once | INTRAVENOUS | Status: AC
  Administered 2014-12-16: 13:00:00 via INTRAVENOUS
  Filled 2014-12-16: qty 4

## 2014-12-16 MED ORDER — SODIUM CHLORIDE 0.9 % IV SOLN
INTRAVENOUS | Status: AC
  Administered 2014-12-16: 12:00:00 via INTRAVENOUS

## 2014-12-16 NOTE — Progress Notes (Incomplete)
°  Radiation Oncology         (336) [phone] ________________________________  Name: Kerry Peck MRN: [id number]  Date: 12/13/2014  DOB: February 15, 1961  End of Treatment Note   ICD-9-CM ICD-10-CM    1. 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   ***  Plan: The patient has completed radiation treatment. The patient will return to radiation oncology clinic for routine followup in one month. I advised them to call or return sooner if they have any questions or concerns related to their recovery or treatment.  -----------------------------------  Blair Promise, PhD, MD

## 2014-12-17 ENCOUNTER — Encounter: Payer: Self-pay | Admitting: Nurse Practitioner

## 2014-12-17 DIAGNOSIS — D61810 Antineoplastic chemotherapy induced pancytopenia: Secondary | ICD-10-CM | POA: Insufficient documentation

## 2014-12-17 DIAGNOSIS — R112 Nausea with vomiting, unspecified: Secondary | ICD-10-CM | POA: Insufficient documentation

## 2014-12-17 DIAGNOSIS — T451X5A Adverse effect of antineoplastic and immunosuppressive drugs, initial encounter: Secondary | ICD-10-CM

## 2014-12-17 DIAGNOSIS — E8809 Other disorders of plasma-protein metabolism, not elsewhere classified: Secondary | ICD-10-CM | POA: Insufficient documentation

## 2014-12-17 DIAGNOSIS — R531 Weakness: Secondary | ICD-10-CM | POA: Insufficient documentation

## 2014-12-17 DIAGNOSIS — E860 Dehydration: Secondary | ICD-10-CM | POA: Insufficient documentation

## 2014-12-17 NOTE — Assessment & Plan Note (Signed)
Received cycle 1, day 8 of his cisplatin/irinotecan chemotherapy on 12/09/14.   He is scheduled to return for cycle 2 of same regimen on 12/23/14.

## 2014-12-17 NOTE — Assessment & Plan Note (Signed)
WBC 1.4, ANC 1.0, HGB 9.2, PLT 83.  Reviewed all neutropenia guidelines with patient again today. Pt afebrile on exam today.

## 2014-12-17 NOTE — Assessment & Plan Note (Signed)
Pt with minimal oral intake secondary to both mucositis/esophagitis and nausea/vomiting. Pt received approx 1500 mls IV fluid rehydration today.    Advised pt to call/return next week if he continues to feel dehydrated; and needs additional fluids.   Also, advised pt to go directly to the ED over the weekend if worsening symptoms.

## 2014-12-17 NOTE — Assessment & Plan Note (Signed)
Pt requested and was given oxycodone refill today.

## 2014-12-17 NOTE — Assessment & Plan Note (Signed)
C/o nausea/vomiting for past 24 hours. Pt was actively vomiting on initial exam.  Was given Zofran IV IV fluids.  Pt stated that he then felt much better; and was able to take sips of fluid with no difficulty.  Has antiemetics at home.

## 2014-12-17 NOTE — Assessment & Plan Note (Signed)
Pt appears much weaker today than on previous exams. Weakness most likely secondary to chemo, mucositis/poor oral intake; and nausea/vomiting leading to dehydration. Pt was encouraged to push fluids at home this weekend.

## 2014-12-17 NOTE — Assessment & Plan Note (Signed)
Chemotherapy-induced mucositis.  C/o tenderness to entire tongue and posterior throat; worse with any swallowing of fluids. On exam- no specific oral lesions noted; and posterior throat with no erythema or exudate. Pt managing all secretions with no difficulty. Prescribed magic mouthwash with lidocaine to see if that helps.

## 2014-12-17 NOTE — Assessment & Plan Note (Signed)
C/o nausea/vomiting; and mucositis- with minimal appetite and very poor intake.  He appears very dehydrated today; and was initially actively vomiting while at the cancer center.   Pt given zofran and IV fluids rehydration today.  Pt was able to tolerate sips of fluid with no difficulty prior to discharge.

## 2014-12-17 NOTE — Assessment & Plan Note (Signed)
Albumin further decreased to 2.8.  Pt was encouraged to push protein in his diet as much as possible.

## 2014-12-17 NOTE — Progress Notes (Signed)
SYMPTOM MANAGEMENT CLINIC   HPI: Kerry Peck 54 y.o. male diagnosed with lung cancer.  Currently undergoing cisplatin/irinotecan chemotherapy.    Pt received cycle 1, day 8 of his cisplatin/irinotecan chemotherapy on 12/09/14.  He is c/o nausea/vomiting and mucositis. His main complaint is increased sore throat with swallowing. He feels dehydrated. He denies any fever or chills.    HPI  ROS  Past Medical History  Diagnosis Date  . Diabetes mellitus without complication   . Hypertension   . Smoking addiction   . Cancer     lung and liver  . Radiation 08/16/14-09/06/14    prophylatic cranial radiation 25 gray, chest, t-spine  35 gray  . Radiation 09/29/14-10/12/14    left hilar area 30 gray, right pelvis 30 gray, right lower rib cage area 30 gray  . 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 No pertinent past surgical history.  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    has No Known Allergies.    Medication List       This list is accurate as of: 12/16/14 11:59 PM.  Always use your most recent med list.               5 SERIES BP MONITOR Devi     albuterol 108 (90 BASE) MCG/ACT inhaler  Commonly known as:  PROVENTIL HFA;VENTOLIN HFA  Inhale 2 puffs into the lungs every 6 (six) hours as needed for wheezing.     amLODipine 10 MG tablet  Commonly known as:  NORVASC  Take 10 mg by mouth daily.     aspirin  EC 81 MG tablet  Take 81 mg by mouth as directed. Take 1 tablet all days except Wednesday     atorvastatin 40 MG tablet  Commonly known as:  LIPITOR  Take 40 mg by mouth daily.     calcium-vitamin D 500-200 MG-UNIT per tablet  Commonly known as:  OSCAL WITH D  Take 1 tablet by mouth daily with breakfast.     CENTRUM SILVER ADULT 50+ PO  Take 1 tablet by mouth daily.     dexamethasone 4 MG tablet  Commonly known as:  DECADRON  TAKE 1 TABLET(4 MG) BY MOUTH FOUR TIMES DAILY     emollient cream  Commonly known as:  BIAFINE  Apply topically as needed.     esomeprazole 20 MG capsule  Commonly known as:  NEXIUM  Take 20 mg by mouth daily as needed (GERD).     fenofibrate 160 MG tablet  Take 160 mg by mouth daily.     fluconazole 100 MG tablet  Commonly known as:  DIFLUCAN  Take 1 tablet (100 mg total) by mouth daily. Take 2 tablets on day 1     Fluticasone-Salmeterol 250-50 MCG/DOSE Aepb  Commonly known as:  ADVAIR  Inhale 1 puff into the lungs every 12 (twelve) hours.     Insulin Glargine 100 UNIT/ML Solostar Pen  Commonly known as:  LANTUS  Inject 15 Units into the skin daily.     magic mouthwash w/lidocaine Soln  Take 5 mLs by mouth 4 (four)  times daily as needed for mouth pain ([facility] mouthwash 1:1).     magnesium oxide 400 (241.3 MG) MG tablet  Commonly known as:  MAG-OX  Take 1 tablet (400 mg total) by mouth 2 (two) times daily.     metFORMIN 500 MG tablet  Commonly known as:  GLUCOPHAGE  Take 1,000 mg by mouth 2 (two) times daily with a meal.     metoprolol tartrate 25 MG tablet  Commonly known as:  LOPRESSOR  Take 25 mg by mouth daily.     ondansetron 8 MG tablet  Commonly known as:  ZOFRAN  Take 8 mg by mouth every 8 (eight) hours as needed for nausea or vomiting. Disp. 20 tablets. 1 refill.  Called in to [facility] per Dr. Sondra Come on 11/24/14.     ONE TOUCH ULTRA TEST test strip  Generic drug:  glucose blood  1 each by Other route 3 (three) times daily.       OxyCODONE HCl ER 60 MG T12a  Commonly known as:  OXYCONTIN  Take 60 mg by mouth every 12 (twelve) hours.     Oxycodone HCl 10 MG Tabs  Take 1-2 tablets (10-20 mg total) by mouth every 4 (four) hours as needed.     pantoprazole 40 MG tablet  Commonly known as:  PROTONIX  Take 40 mg by mouth daily.     Pen Needles 3/16" 31G X 5 MM Misc  Use daily with your Lantus injection     polyethylene glycol packet  Commonly known as:  MIRALAX / GLYCOLAX  Take 17 g by mouth daily.     pyridOXINE 100 MG tablet  Commonly known as:  VITAMIN B-6  Take 100 mg by mouth daily.     senna 8.6 MG Tabs tablet  Commonly known as:  SENOKOT  Take 1 tablet (8.6 mg total) by mouth 2 (two) times daily.     sucralfate 1 GM/10ML suspension  Commonly known as:  CARAFATE  Take 10 mLs (1 g total) by mouth 4 (four) times daily -  with meals and at bedtime.     tamsulosin 0.4 MG Caps capsule  Commonly known as:  FLOMAX  Take 1 capsule (0.4 mg total) by mouth daily.     vitamin C 500 MG tablet  Commonly known as:  ASCORBIC ACID  Take 500 mg by mouth daily.         PHYSICAL EXAMINATION  Oncology Vitals 12/16/2014 12/13/2014 12/09/2014 12/08/2014 12/08/2014 12/02/2014 12/01/2014  Height - 188 cm 188 cm - 188 cm - -  Weight - 81.602 kg 84.414 kg - 83.961 kg - -  Weight (lbs) - 179 lbs 14 oz 186 lbs 2 oz - 185 lbs 2 oz - -  BMI (kg/m2) - 23.1 kg/m2 23.89 kg/m2 - 23.77 kg/m2 - -  Temp 98.8 98.5 97.8 - 98.1 98.3 98.3  Pulse 107 81 82 142 95 97 97  Resp _0 SpO2 100 100 99 - 100 94 100  BSA (m2) - 2.06 m2 2.1 m2 - 2.09 m2 - -   BP Readings from Last 3 Encounters:  12/16/14 100/59  12/13/14 137/121  12/09/14 110/72    Physical Exam  Constitutional: He is oriented to person, place, and time. He appears malnourished and dehydrated. He appears unhealthy. He appears cachectic.  Pt appears fatigued, weak, frail, and chronically ill.   HENT:  Head: Normocephalic and atraumatic.  Tongue  with slight white coating to tongue.  No oral lesions noted; and posterior throat with no erythema or exudate. Managing secretions with no difficulty; but does appear uncomfortable   Eyes: Conjunctivae and EOM are normal. Pupils are equal, round, and reactive to light. Right eye exhibits no discharge. Left eye exhibits no discharge. No scleral icterus.  Neck: Normal range of motion. Neck supple. No JVD present. No tracheal deviation present. No thyromegaly present.  Cardiovascular: Normal rate, regular rhythm, normal heart sounds and intact distal pulses.   Pulmonary/Chest: Effort normal and breath sounds normal. No stridor. No respiratory distress. He has no wheezes. He has no rales. He exhibits no tenderness.  Abdominal: Soft. Bowel sounds are normal. He exhibits no distension and no mass. There is no tenderness. There is no rebound and no guarding.  Musculoskeletal: Normal range of motion. He exhibits no edema or tenderness.  Lymphadenopathy:    He has no cervical adenopathy.  Neurological: He is alert and oriented to person, place, and time.  Pt appears very weak; but is able to ambulate with no assist.   Skin: Skin is warm and dry. No rash noted. No erythema. There is pallor.  Psychiatric: Affect normal.  Nursing note and vitals reviewed.   LABORATORY DATA:. Appointment on 12/16/2014  Component Date Value Ref Range Status  . Magnesium 12/16/2014 1.6  1.5 - 2.5 mg/dl Final  . WBC 12/16/2014 1.4* 4.0 - 10.3 10e3/uL Final  . NEUT# 12/16/2014 1.0* 1.5 - 6.5 10e3/uL Final  . HGB 12/16/2014 9.2* 13.0 - 17.1 g/dL Final  . HCT 12/16/2014 27.2* 38.4 - 49.9 % Final  . Platelets 12/16/2014 83* 140 - 400 10e3/uL Final  . MCV 12/16/2014 91.0  79.3 - 98.0 fL Final  . MCH 12/16/2014 30.8  27.2 - 33.4 pg Final  . MCHC 12/16/2014 33.8  32.0 - 36.0 g/dL Final  . RBC 12/16/2014 2.99* 4.20 - 5.82 10e6/uL Final  . RDW 12/16/2014 14.5  11.0 - 14.6 % Final  . lymph# 12/16/2014 0.2* 0.9 - 3.3 10e3/uL Final   . MONO# 12/16/2014 0.2  0.1 - 0.9 10e3/uL Final  . Eosinophils Absolute 12/16/2014 0.0  0.0 - 0.5 10e3/uL Final  . Basophils Absolute 12/16/2014 0.0  0.0 - 0.1 10e3/uL Final  . NEUT% 12/16/2014 71.6  39.0 - 75.0 % Final  . LYMPH% 12/16/2014 14.2  14.0 - 49.0 % Final  . MONO% 12/16/2014 12.8  0.0 - 14.0 % Final  . EOS% 12/16/2014 1.4  0.0 - 7.0 % Final  . BASO% 12/16/2014 0.0  0.0 - 2.0 % Final  . Sodium 12/16/2014 139  136 - 145 mEq/L Final  . Potassium 12/16/2014 3.8  3.5 - 5.1 mEq/L Final  . Chloride 12/16/2014 101  98 - 109 mEq/L Final  . CO2 12/16/2014 19* 22 - 29 mEq/L Final  . Glucose 12/16/2014 205* 70 - 140 mg/dl Final  . BUN 12/16/2014 20.9  7.0 - 26.0 mg/dL Final  . Creatinine 12/16/2014 0.7  0.7 - 1.3 mg/dL Final  . Total Bilirubin 12/16/2014 0.68  0.20 - 1.20 mg/dL Final  . Alkaline Phosphatase 12/16/2014 186* 40 - 150 U/L Final  . AST 12/16/2014 22  5 - 34 U/L Final  . ALT 12/16/2014 41  0 - 55 U/L Final  . Total Protein 12/16/2014 6.7  6.4 - 8.3 g/dL Final  . Albumin 12/16/2014 2.8* 3.5 - 5.0 g/dL Final  . Calcium 12/16/2014 8.7  8.4 - 10.4 mg/dL Final  . Anion Gap 12/16/2014 19* 3 - 11 mEq/L Final  .  EGFR 12/16/2014 >90  >90 ml/min/1.73 m2 Final   eGFR is calculated using the CKD-EPI Creatinine Equation (2009)     RADIOGRAPHIC STUDIES: No results found.  ASSESSMENT/PLAN:    Small cell lung cancer Received cycle 1, day 8 of his cisplatin/irinotecan chemotherapy on 12/09/14.   He is scheduled to return for cycle 2 of same regimen on 12/23/14.   Mucositis due to antineoplastic therapy Chemotherapy-induced mucositis.  C/o tenderness to entire tongue and posterior throat; worse with any swallowing of fluids. On exam- no specific oral lesions noted; and posterior throat with no erythema or exudate. Pt managing all secretions with no difficulty. Prescribed magic mouthwash with lidocaine to see if that helps.    Cancer associated pain Pt requested and was given  oxycodone refill today.    Loss of appetite C/o nausea/vomiting; and mucositis- with minimal appetite and very poor intake.  He appears very dehydrated today; and was initially actively vomiting while at the cancer center.   Pt given zofran and IV fluids rehydration today.  Pt was able to tolerate sips of fluid with no difficulty prior to discharge.    Nausea with vomiting C/o nausea/vomiting for past 24 hours. Pt was actively vomiting on initial exam.  Was given Zofran IV IV fluids.  Pt stated that he then felt much better; and was able to take sips of fluid with no difficulty.  Has antiemetics at home.     Dehydration Pt with minimal oral intake secondary to both mucositis/esophagitis and nausea/vomiting. Pt received approx 1500 mls IV fluid rehydration today.    Advised pt to call/return next week if he continues to feel dehydrated; and needs additional fluids.   Also, advised pt to go directly to the ED over the weekend if worsening symptoms.    Weakness Pt appears much weaker today than on previous exams. Weakness most likely secondary to chemo, mucositis/poor oral intake; and nausea/vomiting leading to dehydration. Pt was encouraged to push fluids at home this weekend.    Hypoalbuminemia Albumin further decreased to 2.8.  Pt was encouraged to push protein in his diet as much as possible.    Antineoplastic chemotherapy induced pancytopenia WBC 1.4, ANC 1.0, HGB 9.2, PLT 83.  Reviewed all neutropenia guidelines with patient again today. Pt afebrile on exam today.    Patient stated understanding of all instructions; and was in agreement with this plan of care. The patient knows to call the clinic with any problems, questions or concerns.   Review/collaboration with Dr. Julien Nordmann regarding all aspects of patient's visit today.   Total time spent with patient was 40 minutes;  with greater than 75 percent of that time spent in face to face counseling regarding patient's symptoms,   and coordination of care and follow up.  Disclaimer: This note was dictated with voice recognition software. Similar sounding words can inadvertently be transcribed and may not be corrected upon review.   Drue Second, NP 12/17/2014

## 2014-12-19 ENCOUNTER — Telehealth: Payer: Self-pay | Admitting: *Deleted

## 2014-12-19 ENCOUNTER — Encounter: Payer: Self-pay | Admitting: Radiation Oncology

## 2014-12-19 NOTE — Progress Notes (Signed)
  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   Beams/energy:  The patient will be treated with 2 separate isocenters in light of his previous treatment to the T6 area.   Narrative: The patient tolerated radiation treatment relatively well.   He seemed to have some improvement in his back pain  Plan: The patient has completed radiation treatment. The patient will return to radiation oncology clinic for routine followup in one month. I advised them to call or return sooner if they have any questions or concerns related to their recovery or treatment.  -----------------------------------  Blair Promise, PhD, MD

## 2014-12-19 NOTE — Telephone Encounter (Signed)
LM for pt to rtn call- check and follow up on how he is feeling since Friday visit with Cyndee Berniece Salines

## 2014-12-22 ENCOUNTER — Other Ambulatory Visit: Payer: Self-pay | Admitting: Medical Oncology

## 2014-12-22 DIAGNOSIS — C3490 Malignant neoplasm of unspecified part of unspecified bronchus or lung: Secondary | ICD-10-CM

## 2014-12-23 ENCOUNTER — Encounter: Payer: Self-pay | Admitting: Physician Assistant

## 2014-12-23 ENCOUNTER — Ambulatory Visit (HOSPITAL_BASED_OUTPATIENT_CLINIC_OR_DEPARTMENT_OTHER): Payer: Federal, State, Local not specified - PPO | Admitting: Physician Assistant

## 2014-12-23 ENCOUNTER — Other Ambulatory Visit: Payer: Self-pay | Admitting: *Deleted

## 2014-12-23 ENCOUNTER — Ambulatory Visit (HOSPITAL_COMMUNITY)
Admission: RE | Admit: 2014-12-23 | Discharge: 2014-12-23 | Disposition: A | Discharge status: Home / Self Care | Payer: Federal, State, Local not specified - PPO | Source: Ambulatory Visit | Attending: Internal Medicine | Admitting: Internal Medicine

## 2014-12-23 ENCOUNTER — Other Ambulatory Visit: Payer: Self-pay | Admitting: Physician Assistant

## 2014-12-23 ENCOUNTER — Other Ambulatory Visit (HOSPITAL_BASED_OUTPATIENT_CLINIC_OR_DEPARTMENT_OTHER): Payer: Federal, State, Local not specified - PPO

## 2014-12-23 ENCOUNTER — Ambulatory Visit (HOSPITAL_BASED_OUTPATIENT_CLINIC_OR_DEPARTMENT_OTHER): Payer: Federal, State, Local not specified - PPO

## 2014-12-23 VITALS — BP 97/67 | HR 79 | Temp 97.5°F | Resp 18 | Ht 74.0 in | Wt 170.7 lb

## 2014-12-23 VITALS — BP 114/66 | HR 81 | Temp 97.3°F | Resp 18

## 2014-12-23 DIAGNOSIS — C7A1 Malignant poorly differentiated neuroendocrine tumors: Secondary | ICD-10-CM | POA: Diagnosis not present

## 2014-12-23 DIAGNOSIS — D6481 Anemia due to antineoplastic chemotherapy: Secondary | ICD-10-CM

## 2014-12-23 DIAGNOSIS — C7B8 Other secondary neuroendocrine tumors: Secondary | ICD-10-CM | POA: Diagnosis not present

## 2014-12-23 DIAGNOSIS — T451X5A Adverse effect of antineoplastic and immunosuppressive drugs, initial encounter: Secondary | ICD-10-CM | POA: Diagnosis not present

## 2014-12-23 DIAGNOSIS — C3490 Malignant neoplasm of unspecified part of unspecified bronchus or lung: Secondary | ICD-10-CM

## 2014-12-23 DIAGNOSIS — R1310 Dysphagia, unspecified: Secondary | ICD-10-CM | POA: Diagnosis not present

## 2014-12-23 DIAGNOSIS — C7951 Secondary malignant neoplasm of bone: Secondary | ICD-10-CM

## 2014-12-23 DIAGNOSIS — R530 Neoplastic (malignant) related fatigue: Secondary | ICD-10-CM

## 2014-12-23 LAB — COMPREHENSIVE METABOLIC PANEL (CC13)
ALBUMIN: 2.7 g/dL — AB (ref 3.5–5.0)
ALK PHOS: 219 U/L — AB (ref 40–150)
ALT: 51 U/L (ref 0–55)
AST: 41 U/L — ABNORMAL HIGH (ref 5–34)
Anion Gap: 13 mEq/L — ABNORMAL HIGH (ref 3–11)
BILIRUBIN TOTAL: 0.68 mg/dL (ref 0.20–1.20)
BUN: 15.3 mg/dL (ref 7.0–26.0)
CHLORIDE: 97 meq/L — AB (ref 98–109)
CO2: 26 meq/L (ref 22–29)
CREATININE: 0.7 mg/dL (ref 0.7–1.3)
Calcium: 8.5 mg/dL (ref 8.4–10.4)
GLUCOSE: 149 mg/dL — AB (ref 70–140)
POTASSIUM: 3.7 meq/L (ref 3.5–5.1)
Sodium: 136 mEq/L (ref 136–145)
Total Protein: 6.1 g/dL — ABNORMAL LOW (ref 6.4–8.3)

## 2014-12-23 LAB — CBC WITH DIFFERENTIAL/PLATELET
BASO%: 0 % (ref 0.0–2.0)
Basophils Absolute: 0 10*3/uL (ref 0.0–0.1)
EOS ABS: 0 10*3/uL (ref 0.0–0.5)
EOS%: 1.4 % (ref 0.0–7.0)
HEMATOCRIT: 23.9 % — AB (ref 38.4–49.9)
HEMOGLOBIN: 7.9 g/dL — AB (ref 13.0–17.1)
LYMPH%: 20.7 % (ref 14.0–49.0)
MCH: 30.9 pg (ref 27.2–33.4)
MCHC: 33.1 g/dL (ref 32.0–36.0)
MCV: 93.4 fL (ref 79.3–98.0)
MONO#: 0.2 10*3/uL (ref 0.1–0.9)
MONO%: 13.1 % (ref 0.0–14.0)
NEUT%: 64.8 % (ref 39.0–75.0)
NEUTROS ABS: 0.9 10*3/uL — AB (ref 1.5–6.5)
Platelets: 57 10*3/uL — ABNORMAL LOW (ref 140–400)
RBC: 2.56 10*6/uL — AB (ref 4.20–5.82)
RDW: 16.7 % — ABNORMAL HIGH (ref 11.0–14.6)
WBC: 1.5 10*3/uL — AB (ref 4.0–10.3)
lymph#: 0.3 10*3/uL — ABNORMAL LOW (ref 0.9–3.3)

## 2014-12-23 LAB — ABO/RH: ABO/RH(D): O POS

## 2014-12-23 LAB — HOLD TUBE, BLOOD BANK

## 2014-12-23 LAB — MAGNESIUM (CC13): Magnesium: 1.6 mg/dl (ref 1.5–2.5)

## 2014-12-23 LAB — PREPARE RBC (CROSSMATCH)

## 2014-12-23 MED ORDER — OXYCODONE HCL 10 MG PO TABS: 10.0000 mg | ORAL_TABLET | ORAL | Status: DC | PRN

## 2014-12-23 MED ORDER — SODIUM CHLORIDE 0.9 % IV SOLN
250.0000 mL | Freq: Once | INTRAVENOUS | Status: AC
  Administered 2014-12-23: 250 mL via INTRAVENOUS

## 2014-12-23 MED ORDER — ACETAMINOPHEN 325 MG PO TABS
650.0000 mg | ORAL_TABLET | Freq: Once | ORAL | Status: AC
  Administered 2014-12-23: 650 mg via ORAL

## 2014-12-23 MED ORDER — DIPHENHYDRAMINE HCL 25 MG PO CAPS
ORAL_CAPSULE | ORAL | Status: AC
  Filled 2014-12-23: qty 1

## 2014-12-23 MED ORDER — DIPHENHYDRAMINE HCL 25 MG PO CAPS
25.0000 mg | ORAL_CAPSULE | Freq: Once | ORAL | Status: AC
  Administered 2014-12-23: 25 mg via ORAL

## 2014-12-23 MED ORDER — ACETAMINOPHEN 325 MG PO TABS
ORAL_TABLET | ORAL | Status: AC
  Filled 2014-12-23: qty 2

## 2014-12-23 MED ORDER — ALUM & MAG HYDROXIDE-SIMETH 200-200-20 MG/5ML PO SUSP
30.0000 mL | Freq: Once | ORAL | Status: AC
  Administered 2014-12-23: 30 mL via ORAL
  Filled 2014-12-23: qty 30

## 2014-12-23 MED ORDER — OXYCODONE HCL ER 60 MG PO T12A: 60.0000 mg | EXTENDED_RELEASE_TABLET | Freq: Two times a day (BID) | ORAL | Status: DC

## 2014-12-23 NOTE — Progress Notes (Addendum)
[place] Telephone:([phone]) [phone]   Fax:([phone]) [place], [place] [address] [place] [address]  DIAGNOSIS: Extensive stage, Stage IV (T2a, N1, M1b) small cell lung cancer diagnosed in July of 2015.  PRIOR THERAPY:  1) Systemic chemotherapy with carboplatin for AUC of 5 on day 1 and etoposide at 120 mg/M2 on days 1, 2 and 3 with Neulasta support on day 4. Status post 6 cycles.  2) prophylactic cranial irradiation under the care of Dr. Sondra Come. 3) palliative radiotherapy to the T7-spine completed on 09/06/2014.  CURRENT THERAPY: Systemic chemotherapy with cisplatin 30 mg/m2 and irinotecan 65 mg /m2 given on days 1 and 8 every 3 weeks. Status post 1 cycle.  INTERVAL HISTORY: Kerry Peck 54 y.o. male returns to the clinic today for follow up visit. His recent restaging CT scan showed evidence for disease progression. He is currently being treated with systemic chemotherapy with cisplatin and irinotecan. He is status post 1 cycle. He completed his palliative radiation to the upper and lower thoracic spine on 12/13/2014. His back pain has improved. He presents in a wheelchair and has a foley catheter.He denied having any significant fever or chills, no nausea or vomiting. He denied having any significant weight loss or night sweats. The patient has no significant chest pain but continues to have shortness of breath with exertion with no cough or hemoptysis. He complains of having trouble sleeping. He also complains of having no appetite, trouble swallowing and heartburn. He has had diarreha, 1-2 episodes per day. He requests refills for his pain medications. He presents to proceed with cycle #2.  MEDICAL HISTORY: Past Medical History  Diagnosis Date  . Diabetes mellitus without complication   . Hypertension   . Smoking addiction   . Cancer     lung and liver  . Radiation 08/16/14-09/06/14    prophylatic cranial  radiation 25 gray, chest, t-spine  35 gray  . Radiation 09/29/14-10/12/14    left hilar area 30 gray, right pelvis 30 gray, right lower rib cage area 30 gray  . Metastatic carcinoma to bone 11/23/2014    ALLERGIES:  has No Known Allergies.  MEDICATIONS:  Current Outpatient Prescriptions  Medication Sig Dispense Refill  . albuterol (PROVENTIL HFA;VENTOLIN HFA) 108 (90 BASE) MCG/ACT inhaler Inhale 2 puffs into the lungs every 6 (six) hours as needed for wheezing. 1 Inhaler 0  . Alum & Mag Hydroxide-Simeth (MAGIC MOUTHWASH W/LIDOCAINE) SOLN Take 5 mLs by mouth 4 (four) times daily as needed for mouth pain ([facility] mouthwash 1:1). 240 mL 1  . aspirin EC 81 MG tablet Take 81 mg by mouth as directed. Take 1 tablet all days except Wednesday    . atorvastatin (LIPITOR) 40 MG tablet Take 40 mg by mouth daily.    . Blood Pressure Monitoring (5 SERIES BP MONITOR) DEVI     . calcium-vitamin D (OSCAL WITH D) 500-200 MG-UNIT per tablet Take 1 tablet by mouth daily with breakfast.     . dexamethasone (DECADRON) 4 MG tablet TAKE 1 TABLET(4 MG) BY MOUTH FOUR TIMES DAILY 40 tablet 0  . emollient (BIAFINE) cream Apply topically as needed. (Patient taking differently: Apply 1 application topically as needed. For radiation itch.) 454 g 0  . Fluticasone-Salmeterol (ADVAIR) 250-50 MCG/DOSE AEPB Inhale 1 puff into the lungs every 12 (twelve) hours.    . Insulin Glargine (LANTUS) 100 UNIT/ML Solostar Pen Inject 15 Units into the skin daily.  15 mL 0  . Insulin Pen Needle (PEN NEEDLES 3/16") 31G X 5 MM MISC Use daily with your Lantus injection 90 each 0  . magnesium oxide (MAG-OX) 400 (241.3 MG) MG tablet Take 1 tablet (400 mg total) by mouth 2 (two) times daily. 60 tablet 0  . metFORMIN (GLUCOPHAGE) 500 MG tablet Take 1,000 mg by mouth 2 (two) times daily with a meal.     . Multiple Vitamins-Minerals (CENTRUM SILVER ADULT 50+ PO) Take 1 tablet by mouth daily.     . ondansetron (ZOFRAN) 8 MG tablet Take 8 mg by mouth every  8 (eight) hours as needed for nausea or vomiting. Disp. 20 tablets. 1 refill.  Called in to [facility] per Dr. Sondra Come on 11/24/14.    Marland Kitchen ONE TOUCH ULTRA TEST test strip 1 each by Other route 3 (three) times daily.     . Oxycodone HCl 10 MG TABS Take 1-2 tablets (10-20 mg total) by mouth every 4 (four) hours as needed. 60 tablet 0  . OxyCODONE HCl ER 60 MG T12A Take 60 mg by mouth every 12 (twelve) hours. 60 each 0  . pantoprazole (PROTONIX) 40 MG tablet Take 40 mg by mouth daily.     . polyethylene glycol (MIRALAX / GLYCOLAX) packet Take 17 g by mouth daily. 14 each 0  . pyridOXINE (VITAMIN B-6) 100 MG tablet Take 100 mg by mouth daily.    Marland Kitchen senna (SENOKOT) 8.6 MG TABS tablet Take 1 tablet (8.6 mg total) by mouth 2 (two) times daily. 120 each 0  . sucralfate (CARAFATE) 1 GM/10ML suspension Take 10 mLs (1 g total) by mouth 4 (four) times daily -  with meals and at bedtime. (Patient taking differently: Take 1 g by mouth 4 (four) times daily -  with meals and at bedtime. Marland Kitchen) 420 mL 1  . vitamin C (ASCORBIC ACID) 500 MG tablet Take 500 mg by mouth daily.    Marland Kitchen amLODipine (NORVASC) 10 MG tablet Take 10 mg by mouth daily.    Marland Kitchen esomeprazole (NEXIUM) 20 MG capsule Take 20 mg by mouth daily as needed (GERD).     . fenofibrate 160 MG tablet Take 160 mg by mouth daily.    . fluconazole (DIFLUCAN) 100 MG tablet Take 1 tablet (100 mg total) by mouth daily. Take 2 tablets on day 1 (Patient not taking: Reported on 12/23/2014) 8 tablet 0  . metoprolol tartrate (LOPRESSOR) 25 MG tablet Take 25 mg by mouth daily.     . tamsulosin (FLOMAX) 0.4 MG CAPS capsule Take 1 capsule (0.4 mg total) by mouth daily. (Patient not taking: Reported on 12/23/2014) 30 capsule 0   No current facility-administered medications for this visit.   Facility-Administered Medications Ordered in Other Visits  Medication Dose Route Frequency Provider Last Rate Last Dose  . morphine 4 MG/ML injection 2 mg  2 mg Intravenous Q30 min PRN Susanne Borders, NP   2 mg at 11/23/14 1345    REVIEW OF SYSTEMS:  Constitutional: positive for anorexia, fatigue and weight loss Eyes: negative Ears, nose, mouth, throat, and face: negative Respiratory: positive for dyspnea on exertion Cardiovascular: negative Gastrointestinal: positive for diarrhea, dyspepsia, dysphagia and odynophagia Genitourinary:negative Integument/breast: negative Hematologic/lymphatic: negative Musculoskeletal:positive for back pain and bone pain Neurological: negative Behavioral/Psych: positive for sleep disturbance Endocrine: negative Allergic/Immunologic: negative   PHYSICAL EXAMINATION: General appearance: alert, cooperative and no distress Head: Normocephalic, without obvious abnormality, atraumatic Neck: no adenopathy, no JVD, supple, symmetrical, trachea midline and thyroid not  enlarged, symmetric, no tenderness/mass/nodules Lymph nodes: Cervical, supraclavicular, and axillary nodes normal. Resp: clear to auscultation bilaterally Back: symmetric, no curvature. ROM normal. No CVA tenderness. Cardio: regular rate and rhythm, S1, S2 normal, no murmur, click, rub or gallop GI: soft, non-tender; bowel sounds normal; no masses,  no organomegaly Extremities: extremities normal, atraumatic, no cyanosis or edema Neurologic: Alert and oriented X 3, normal strength and tone. Normal symmetric reflexes. Normal coordination and gait  ECOG PERFORMANCE STATUS: 2 - Symptomatic, <50% confined to bed  Blood pressure 97/67, pulse 79, temperature 97.5 F (36.4 C), temperature source Oral, resp. rate 18, height '6\' 2"'$  (1.88 m), weight 170 lb 11.2 oz (77.429 kg), SpO2 100 %.  LABORATORY DATA: Lab Results  Component Value Date   WBC 1.5* 12/23/2014   HGB 7.9* 12/23/2014   HCT 23.9* 12/23/2014   MCV 93.4 12/23/2014   PLT 57* 12/23/2014      Chemistry      Component Value Date/Time   NA 136 12/23/2014 1005   NA 132* 12/09/2014 1027   K 3.7 12/23/2014 1005   K 3.8  12/09/2014 1027   CL 97 12/09/2014 1027   CO2 26 12/23/2014 1005   CO2 26 12/09/2014 1027   BUN 15.3 12/23/2014 1005   BUN 18 12/09/2014 1027   CREATININE 0.7 12/23/2014 1005   CREATININE 0.59 12/09/2014 1027      Component Value Date/Time   CALCIUM 8.5 12/23/2014 1005   CALCIUM 8.1* 12/09/2014 1027   ALKPHOS 219* 12/23/2014 1005   ALKPHOS 151* 12/09/2014 1027   AST 41* 12/23/2014 1005   AST 54* 12/09/2014 1027   ALT 51 12/23/2014 1005   ALT 46 12/09/2014 1027   BILITOT 0.68 12/23/2014 1005   BILITOT 0.3 12/09/2014 1027       RADIOGRAPHIC STUDIES: Mr Thoracic Spine W Wo Contrast  11/26/2014   CLINICAL DATA:  Known spinal metastatic disease. Unable to bear weight. Urinary retention. Severe pain.  EXAM: MRI THORACIC SPINE WITHOUT AND WITH CONTRAST  TECHNIQUE: Multiplanar and multiecho pulse sequences of the thoracic spine were obtained without and with intravenous contrast.  CONTRAST:  6m MULTIHANCE GADOBENATE DIMEGLUMINE 529 MG/ML IV SOLN  COMPARISON:  11/23/2014  FINDINGS: Sagittal images proceed from left to right, contrary to our normal convention.  No change is appreciated since the study of 3 days ago. There is diffuse marrow involvement by metastatic tumor throughout the region image to including the sternum, ribs and spine. At the T1-2 level, there is extraosseous tumor within the left side of the spinal canal which displaces the cord towards the right and could cause cord compressive symptoms.  At T2-3, there is a small amount of epidural tumor on the right which contacts the cord but does not appear to cause cord compression.  At T8, there is some ventral epidural tumor that contacts the ventral aspect of the cord but does not appear to cause cord compression.  At T9, there is ventral epidural tumor that contacts the ventral aspect of the cord and may deform the cord slightly on the left.  At T10, there is a small amount of ventral epidural tumor without cord compression.  Partial  compression fractures are present at multiple levels, unchanged since the previous study. These include T3, T8, T9, T10, T11 and T12.  I do not identify any intra medullary metastatic disease in the region.  IMPRESSION: Please note sagittal images proceed from left to right, contrary to our normal convention.  No change since the  study of 3 days ago. There could be potential for some cord compressive symptoms based on the disease at T1-2 and T9.  Critical Value/emergent results were called by telephone at the time of interpretation on 11/26/2014 at 2:42 pm to Dr. Phillips Climes , who verbally acknowledged these results.   Electronically Signed   By: Nelson Chimes M.D.   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 Left-sided chest pain. Spinal canal encroachment by tumor on CT scan of the chest dated 11/16/2014  EXAM: MRI THORACIC SPINE WITHOUT AND WITH CONTRAST  TECHNIQUE: Multiplanar and multiecho pulse sequences of the thoracic spine were obtained without and with intravenous contrast.  CONTRAST:  34m MULTIHANCE GADOBENATE DIMEGLUMINE 529 MG/ML IV SOLN  COMPARISON:  CT scan dated 11/16/2014  FINDINGS: Tumor infiltrates every thoracic vertebra except T7. Epidural tumor extends into the the left side of the spinal canal at T1-2 and deviates the spinal cord to the right. Tumor extends into the left neural foramina at T1-2 and T2-3. There is extensive tumor in the posterior elements of T3 and T4. There pathologic compression fractures of T3, T8, T9, and T11 and T12.  The posterior margins of T3 and T8 and T9 protrude into the spinal canal. Tumor encroaches on the left neural foramen at T9-10.  Note is made of numerous metastatic lesions in the liver.  IMPRESSION: 1. Diffuse metastatic disease throughout the entire thoracic spine with the exception of sparing of the T7 vertebra. 2. Extensive epidural tumor on the left at T2-3 which  deviates spinal cord to the right. 3. Multiple pathologic fractures with slight protrusions of the posterior aspects of T3, T8 and T9 into the spinal canal with extension into the left neural foramen at T9-10.   Electronically Signed   By: JLorriane ShireM.D.   On: 11/24/2014 08:25   Mr Lumbar Spine W Wo Contrast  12/01/2014   CLINICAL DATA:  54year old male with metastatic small cell lung cancer. Increased back pain. Subsequent encounter.  EXAM: MRI LUMBAR SPINE WITHOUT AND WITH CONTRAST  TECHNIQUE: Multiplanar and multiecho pulse sequences of the lumbar spine were obtained without and with intravenous contrast.  CONTRAST:  18 mL MultiHance.  COMPARISON:  Thoracic spine MRI 11/26/2014 and earlier  FINDINGS: Diffuse osseous metastatic disease throughout the visualized lumbar spine, lower thoracic spine, sacrum, and pelvis.  Same numbering system used as on the recent thoracic studies. By this designation the L5 level is sacralized.  Diffuse lumbar pathologic compression fractures. Lumbar extraosseous/epidural tumor at L1, L3, L4, L5. Expansile sacral tumor with epidural and foraminal involvement bilaterally at S1. The epidural tumor contributes to moderate or severe spinal stenosis from L3 to S1.  Large expansile tumor with extraosseous extension in the medial right iliac bone (series 12, image 47).  No abnormal signal in the visualized lower thoracic spinal cord. The conus medullaris is at T12-L1. No thickening or abnormal enhancement of the cauda equina nerve roots to suggest intradural tumor at this time. However there is dural tumor suspected from the L4 level inferiorly (see series 11, image 8).  IMPRESSION: 1. Diffuse osseous metastatic disease with no visible level spared. 2. 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 No lumbar intradural tumor identified. 3. 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   Electronically  Signed   By: HLemmie Evens  [place] Crane M.D.   On: 12/01/2014 21:10    ASSESSMENT AND PLAN: this is a very pleasant 54 years old white male recently diagnosed with extensive stage small cell lung cancer status post 6 cycles of systemic chemotherapy with carboplatin and etoposide with partial response. This was followed by prophylactic cranial irradiation in addition to palliative radiotherapy to the T7 thoracic spine and the chest. The recent CT scan of the Chest, Abdomen and pelvis showed evidence for further disease progression with progressive osseous metastasis and tumor canal encroachment involving the thoracic spine as well as development of hepatic metastasis and nodal metastasis within the upper abdomen. He completed palliative radiation to the upper and lower thoracic spine on 12/13/2014. He is currently being treated with systemic chemotherapy with cisplatin and irinotecan on days 1 and 8 every 3 weeks. He is status post 1 cycle. He tolerated this first treatment well with the exception of diarrhea. . The patient was discussed with and also seen by Dr. Julien Nordmann.  He is having significant difficulty with swallowing, likely related to his radiation therapy. We will refer him back to Dr. Michail Sermon for further evaluation of his difficulty swallowing.  His counts are too low for him to proceed with chemotherapy as scheduled. We will postpone his chemotherapy by one week and will recheck his counts. If they are within treatable range, he will proceed with cycle #2. Today we  Will transfuse him 2 units of PRBCs to address his symptomatic chemotherapy induced anemia.  He will follow up in 2 weeks, for re-evaluation. For pain management, the patient was given refill prescriptions for OxyContin and oxycodone for breakthrough pain.  The patient was advised to call immediately if he has any concerning symptoms in the interval. The patient voices understanding of current disease status and treatment options and is in agreement  with the current care plan.  All questions were answered. The patient knows to call the clinic with any problems, questions or concerns. We can certainly see the patient much sooner if necessary.  Carlton Adam, PA-C 12/23/2014  ADDENDUM:  Hematology/Oncology Attending:  I had a face to face encounter with the patient. I recommended his care plan. This is a very pleasant 54 years old white male with extensive stage small cell lung cancer who was treated with first-line chemotherapy with carboplatin and etoposide with significant response but unfortunately restaging scan showed evidence for disease progression and the patient was started on second line chemotherapy with cisplatin and irinotecan status post 1 cycle. He tolerated the first cycle of his treatment well except for increasing fatigue and weakness as well as several episodes of diarrhea. He also continues to have difficulty swallowing and has 2 by mouth intake. We will refer the patient to Dr. Michail Sermon for reevaluation and consideration of upper endoscopy for evaluation of his dysphagia. His platelets count are low today. We will delay the start of cycle #2 by 1 week until the patient has recovery of his platelets count. For the chemotherapy-induced anemia, I will arrange for the patient to receive 2 units of PRBCs transfusion today. For pain management he will continue on OxyContin and oxycodone for now. The patient would come back for follow-up visit in 2 weeks for reevaluation and management of any adverse effect of his treatment. He was advised to call immediately if he has any concerning symptoms in the interval.  Disclaimer: This note was dictated with voice recognition software. Similar sounding words can inadvertently be transcribed and may be missed upon  review. [name], MD 12/25/2014

## 2014-12-23 NOTE — Patient Instructions (Signed)

## 2014-12-23 NOTE — Progress Notes (Signed)
CBC reviewed with Awilda Metro, PA-C. Chemotherapy held due to neutropenia and thrombocytopenia. Pt worked in to see PA, as no office visit had been scheduled. Order received for blood transfusion.  1330: Pt requests medication for indigestion. Verbal order received from [employer], [place].  1400: Pt reports partial relief of "gas and heartburn."  Tolerating snacks and oral hydration.

## 2014-12-24 ENCOUNTER — Other Ambulatory Visit: Payer: Self-pay | Admitting: Internal Medicine

## 2014-12-24 NOTE — Patient Instructions (Signed)
Your chemotherapy is being postponed by one week secondary to your low blood counts You will receive a blood transfusion today to address your symptomatic chemotherapy induced anemia Follow up in 2 weeks

## 2014-12-26 ENCOUNTER — Telehealth: Payer: Self-pay | Admitting: Oncology

## 2014-12-26 LAB — TYPE AND SCREEN
ABO/RH(D): O POS
ANTIBODY SCREEN: NEGATIVE
UNIT DIVISION: 0
UNIT DIVISION: 0

## 2014-12-26 NOTE — Telephone Encounter (Signed)
Left a message for Kerry Peck regarding Carafate.  Requested a return call.

## 2014-12-27 ENCOUNTER — Other Ambulatory Visit: Payer: Self-pay | Admitting: Radiation Oncology

## 2014-12-27 ENCOUNTER — Telehealth: Payer: Self-pay | Admitting: Oncology

## 2014-12-27 ENCOUNTER — Telehealth: Payer: Self-pay | Admitting: Internal Medicine

## 2014-12-27 MED ORDER — SUCRALFATE 1 GM/10ML PO SUSP: 1.0000 g | Freq: Three times a day (TID) | ORAL | Status: DC

## 2014-12-27 NOTE — Telephone Encounter (Signed)
[name], Foye's sister in law called requesting a refill on Carafate.  She reports Benney is still having problems swallowing and has been taking Carafate.  He last had it filled on 12/06/14.  He is currently getting chemotherapy with his next appointment on 12/30/14.

## 2014-12-27 NOTE — Telephone Encounter (Addendum)
S/w Amber at [facility], she can get pt in today at 3:30 but if he can't make it, it will be the end of May before she can get him an apt. I left msg and advised pt to call us or call [facility] if he can't make it today.... KJ, also lft msg with pt confirming his apt for this coming Friday labs/infusion... Kj faxed over latest labs/notes to [phone]

## 2014-12-27 NOTE — Telephone Encounter (Signed)
Done. Refill at [facility]

## 2014-12-28 ENCOUNTER — Telehealth: Payer: Self-pay | Admitting: *Deleted

## 2014-12-28 NOTE — Telephone Encounter (Signed)
Caryl Pina called to notify us this "patient did not show up for his appointment at [facility] yesterday.  Our office or the patient may call to reschedule if needed."  Will notify provider.  Left message on voicemail for patient with [facility] Phone number [phone]) to call and reschedule.

## 2014-12-30 ENCOUNTER — Encounter: Payer: Self-pay | Admitting: Nurse Practitioner

## 2014-12-30 ENCOUNTER — Ambulatory Visit: Payer: Federal, State, Local not specified - PPO | Admitting: Nutrition

## 2014-12-30 ENCOUNTER — Ambulatory Visit (HOSPITAL_BASED_OUTPATIENT_CLINIC_OR_DEPARTMENT_OTHER): Payer: Federal, State, Local not specified - PPO | Admitting: Nurse Practitioner

## 2014-12-30 ENCOUNTER — Ambulatory Visit (HOSPITAL_BASED_OUTPATIENT_CLINIC_OR_DEPARTMENT_OTHER): Payer: Federal, State, Local not specified - PPO

## 2014-12-30 ENCOUNTER — Other Ambulatory Visit: Payer: Self-pay | Admitting: Medical Oncology

## 2014-12-30 ENCOUNTER — Other Ambulatory Visit: Payer: Self-pay | Admitting: Nurse Practitioner

## 2014-12-30 ENCOUNTER — Other Ambulatory Visit (HOSPITAL_BASED_OUTPATIENT_CLINIC_OR_DEPARTMENT_OTHER): Payer: Federal, State, Local not specified - PPO

## 2014-12-30 VITALS — BP 107/68 | HR 89 | Temp 98.5°F | Resp 18

## 2014-12-30 DIAGNOSIS — R609 Edema, unspecified: Secondary | ICD-10-CM

## 2014-12-30 DIAGNOSIS — C7A1 Malignant poorly differentiated neuroendocrine tumors: Secondary | ICD-10-CM

## 2014-12-30 DIAGNOSIS — R740 Nonspecific elevation of levels of transaminase and lactic acid dehydrogenase [LDH]: Secondary | ICD-10-CM

## 2014-12-30 DIAGNOSIS — R1310 Dysphagia, unspecified: Secondary | ICD-10-CM

## 2014-12-30 DIAGNOSIS — D6481 Anemia due to antineoplastic chemotherapy: Secondary | ICD-10-CM

## 2014-12-30 DIAGNOSIS — Z5111 Encounter for antineoplastic chemotherapy: Secondary | ICD-10-CM

## 2014-12-30 DIAGNOSIS — C7B8 Other secondary neuroendocrine tumors: Secondary | ICD-10-CM

## 2014-12-30 DIAGNOSIS — R600 Localized edema: Secondary | ICD-10-CM

## 2014-12-30 DIAGNOSIS — K1231 Oral mucositis (ulcerative) due to antineoplastic therapy: Secondary | ICD-10-CM | POA: Diagnosis not present

## 2014-12-30 DIAGNOSIS — T451X5A Adverse effect of antineoplastic and immunosuppressive drugs, initial encounter: Secondary | ICD-10-CM

## 2014-12-30 DIAGNOSIS — D61810 Antineoplastic chemotherapy induced pancytopenia: Secondary | ICD-10-CM

## 2014-12-30 DIAGNOSIS — C3490 Malignant neoplasm of unspecified part of unspecified bronchus or lung: Secondary | ICD-10-CM

## 2014-12-30 DIAGNOSIS — R7401 Elevation of levels of liver transaminase levels: Secondary | ICD-10-CM

## 2014-12-30 DIAGNOSIS — E8809 Other disorders of plasma-protein metabolism, not elsewhere classified: Secondary | ICD-10-CM

## 2014-12-30 DIAGNOSIS — C7951 Secondary malignant neoplasm of bone: Secondary | ICD-10-CM

## 2014-12-30 LAB — CBC WITH DIFFERENTIAL/PLATELET
BASO%: 0 % (ref 0.0–2.0)
Basophils Absolute: 0 10*3/uL (ref 0.0–0.1)
EOS%: 0.4 % (ref 0.0–7.0)
Eosinophils Absolute: 0 10*3/uL (ref 0.0–0.5)
HEMATOCRIT: 28.9 % — AB (ref 38.4–49.9)
HGB: 9.7 g/dL — ABNORMAL LOW (ref 13.0–17.1)
LYMPH%: 33.3 % (ref 14.0–49.0)
MCH: 30.6 pg (ref 27.2–33.4)
MCHC: 33.6 g/dL (ref 32.0–36.0)
MCV: 91.2 fL (ref 79.3–98.0)
MONO#: 0.3 10*3/uL (ref 0.1–0.9)
MONO%: 11.5 % (ref 0.0–14.0)
NEUT#: 1.5 10*3/uL (ref 1.5–6.5)
NEUT%: 54.8 % (ref 39.0–75.0)
Platelets: 94 10*3/uL — ABNORMAL LOW (ref 140–400)
RBC: 3.17 10*6/uL — ABNORMAL LOW (ref 4.20–5.82)
RDW: 17.1 % — ABNORMAL HIGH (ref 11.0–14.6)
WBC: 2.8 10*3/uL — ABNORMAL LOW (ref 4.0–10.3)
lymph#: 0.9 10*3/uL (ref 0.9–3.3)
nRBC: 0 % (ref 0–0)

## 2014-12-30 LAB — COMPREHENSIVE METABOLIC PANEL (CC13)
ALT: 58 U/L — AB (ref 0–55)
AST: 49 U/L — AB (ref 5–34)
Albumin: 2.7 g/dL — ABNORMAL LOW (ref 3.5–5.0)
Alkaline Phosphatase: 279 U/L — ABNORMAL HIGH (ref 40–150)
Anion Gap: 16 mEq/L — ABNORMAL HIGH (ref 3–11)
BUN: 9.2 mg/dL (ref 7.0–26.0)
CALCIUM: 9.3 mg/dL (ref 8.4–10.4)
CO2: 23 mEq/L (ref 22–29)
CREATININE: 0.5 mg/dL — AB (ref 0.7–1.3)
Chloride: 99 mEq/L (ref 98–109)
EGFR: >90 mL/min/{1.73_m2} (ref >90)
Glucose: 130 mg/dl (ref 70–140)
Potassium: 3.7 mEq/L (ref 3.5–5.1)
SODIUM: 138 meq/L (ref 136–145)
TOTAL PROTEIN: 6.2 g/dL — AB (ref 6.4–8.3)
Total Bilirubin: 0.43 mg/dL (ref 0.20–1.20)

## 2014-12-30 LAB — MAGNESIUM (CC13): Magnesium: 1.1 mg/dl — CL (ref 1.5–2.5)

## 2014-12-30 MED ORDER — ALUM & MAG HYDROXIDE-SIMETH 200-200-20 MG/5ML PO SUSP
30.0000 mL | Freq: Once | ORAL | Status: AC
  Administered 2014-12-30: 30 mL via ORAL
  Filled 2014-12-30: qty 30

## 2014-12-30 MED ORDER — IRINOTECAN HCL CHEMO INJECTION 100 MG/5ML
65.0000 mg/m2 | Freq: Once | INTRAVENOUS | Status: AC
  Administered 2014-12-30: 140 mg via INTRAVENOUS
  Filled 2014-12-30: qty 7

## 2014-12-30 MED ORDER — DEXTROSE-NACL 5-0.45 % IV SOLN
Freq: Once | INTRAVENOUS | Status: AC
  Administered 2014-12-30: 12:00:00 via INTRAVENOUS
  Filled 2014-12-30: qty 10

## 2014-12-30 MED ORDER — PALONOSETRON HCL INJECTION 0.25 MG/5ML
0.2500 mg | Freq: Once | INTRAVENOUS | Status: AC
  Administered 2014-12-30: 0.25 mg via INTRAVENOUS

## 2014-12-30 MED ORDER — SODIUM CHLORIDE 0.9 % IV SOLN
Freq: Once | INTRAVENOUS | Status: AC
  Administered 2014-12-30: 14:00:00 via INTRAVENOUS
  Filled 2014-12-30: qty 5

## 2014-12-30 MED ORDER — FUROSEMIDE 20 MG PO TABS: ORAL_TABLET | ORAL | Status: DC

## 2014-12-30 MED ORDER — PALONOSETRON HCL INJECTION 0.25 MG/5ML
INTRAVENOUS | Status: AC
  Filled 2014-12-30: qty 5

## 2014-12-30 MED ORDER — SODIUM CHLORIDE 0.9 % IV SOLN
Freq: Once | INTRAVENOUS | Status: AC
  Administered 2014-12-30: 12:00:00 via INTRAVENOUS

## 2014-12-30 MED ORDER — SODIUM CHLORIDE 0.9 % IV SOLN
30.0000 mg/m2 | Freq: Once | INTRAVENOUS | Status: AC
  Administered 2014-12-30: 65 mg via INTRAVENOUS
  Filled 2014-12-30: qty 65

## 2014-12-30 NOTE — Assessment & Plan Note (Signed)
Chemotherapy-induced mucositis.  C/o tenderness to entire tongue and posterior throat; worse with any swallowing of fluids. On exam- no specific oral lesions noted; and posterior throat with no erythema or exudate. Pt managing all secretions with no difficulty.  Patient continues to use Magic mouthwash with lidocaine as directed.

## 2014-12-30 NOTE — Assessment & Plan Note (Signed)
Patient has increased bilateral lower extremity peripheral edema today.  Patient states that he typically sits with his legs hanging down on a daily basis; and rarely elevates his legs.   On exam.-Patient with +3 bilateral lower extremity peripheral edema. All pulses are palpable; and all extremities are warm.  There was no obvious shortness of breath or complaining of cough on exam.  Most likely, peripheral edema is secondary to hypoalbuminemia and chemotherapy.  Patient was encouraged to keep his feet elevated above the level of his heart when he is at rest.  Also, he may want to consider compression stockings when he is at work.  Will prescribe Lasix 10 mg every morning for the next 7 days to see if that helps.

## 2014-12-30 NOTE — Progress Notes (Signed)
SYMPTOM MANAGEMENT CLINIC   HPI: Kerry Peck 54 y.o. male diagnosed with lung cancer.  Currently undergoing cisplatin/irinotecan chemotherapy.    Patient presents to the [facility] today to receive cycle 2, day 1 of his cisplatin/irinotecan chemotherapy regimen.  He continues to complain of some mucositis and occasional difficulty swallowing.  He is also complaining of increased bilateral lower extremity peripheral edema as well.  He denies any worsening shortness of breath, pain with inspiration, or chest pain.  He also denies any recent fevers or chills.  HPI  ROS  Past Medical History  Diagnosis Date  . Diabetes mellitus without complication   . Hypertension   . Smoking addiction   . Cancer     lung and liver  . Radiation 08/16/14-09/06/14    prophylatic cranial radiation 25 gray, chest, t-spine  35 gray  . Radiation 09/29/14-10/12/14    left hilar area 30 gray, right pelvis 30 gray, right lower rib cage area 30 gray  . 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 No pertinent past surgical history.  has Hypertension; Hyperlipidemia; Diabetes mellitus; Overweight(278.02); Nicotine addiction; Reactive airway disease; Mass of lower lobe of left lung; Small cell lung cancer; Mucositis due to antineoplastic therapy; Peripheral edema; Bronchitis; Hypercalcemia of malignancy; Cancer associated pain; Metastatic carcinoma to bone; Uncontrolled pain; Palliative care encounter; CN (constipation); Loss of appetite; Insomnia; Protein-calorie malnutrition, severe; Nausea with vomiting; Dehydration; Weakness; Hypoalbuminemia; Antineoplastic chemotherapy induced pancytopenia; Hyperphosphatemia; Transaminitis; and Hypomagnesemia on his problem list.    has No Known Allergies.    Medication List       This list is accurate as of: 12/30/14  4:39 PM.  Always use your most recent med list.               5 SERIES BP MONITOR Devi     albuterol 108 (90 BASE) MCG/ACT  inhaler  Commonly known as:  PROVENTIL HFA;VENTOLIN HFA  Inhale 2 puffs into the lungs every 6 (six) hours as needed for wheezing.     amLODipine 10 MG tablet  Commonly known as:  NORVASC  Take 10 mg by mouth daily.     aspirin EC 81 MG tablet  Take 81 mg by mouth as directed. Take 1 tablet all days except Wednesday     atorvastatin 40 MG tablet  Commonly known as:  LIPITOR  Take 40 mg by mouth daily.     calcium-vitamin D 500-200 MG-UNIT per tablet  Commonly known as:  OSCAL WITH D  Take 1 tablet by mouth daily with breakfast.     CENTRUM SILVER ADULT 50+ PO  Take 1 tablet by mouth daily.     dexamethasone 4 MG tablet  Commonly known as:  DECADRON  Take 1 tablet (4 mg total) by mouth 2 (two) times daily.     emollient cream  Commonly known as:  BIAFINE  Apply topically as needed.     esomeprazole 20 MG capsule  Commonly known as:  NEXIUM  Take 20 mg by mouth daily as needed (GERD).     fenofibrate 160 MG tablet  Take 160 mg by mouth daily.     fluconazole 100 MG tablet  Commonly known as:  DIFLUCAN  Take 1 tablet (100 mg total) by mouth daily. Take 2 tablets on day 1     Fluticasone-Salmeterol 250-50 MCG/DOSE Aepb  Commonly known as:  ADVAIR  Inhale 1 puff into the lungs every 12 (twelve) hours.     furosemide  20 MG tablet  Commonly known as:  LASIX  Take 1/2 tab (10 mg) PO Q AM.     Insulin Glargine 100 UNIT/ML Solostar Pen  Commonly known as:  LANTUS  Inject 15 Units into the skin daily.     magic mouthwash w/lidocaine Soln  Take 5 mLs by mouth 4 (four) times daily as needed for mouth pain ([facility] mouthwash 1:1).     magnesium oxide 400 (241.3 MG) MG tablet  Commonly known as:  MAG-OX  Take 1 tablet (400 mg total) by mouth 2 (two) times daily.     metFORMIN 500 MG tablet  Commonly known as:  GLUCOPHAGE  Take 1,000 mg by mouth 2 (two) times daily with a meal.     metoprolol tartrate 25 MG tablet  Commonly known as:  LOPRESSOR  Take 25 mg by mouth  daily.     ondansetron 8 MG tablet  Commonly known as:  ZOFRAN  Take 8 mg by mouth every 8 (eight) hours as needed for nausea or vomiting. Disp. 20 tablets. 1 refill.  Called in to [facility] per Dr. Sondra Come on 11/24/14.     ONE TOUCH ULTRA TEST test strip  Generic drug:  glucose blood  1 each by Other route 3 (three) times daily.     OxyCODONE HCl ER 60 MG T12a  Take 60 mg by mouth every 12 (twelve) hours.     Oxycodone HCl 10 MG Tabs  Take 1-2 tablets (10-20 mg total) by mouth every 4 (four) hours as needed.     pantoprazole 40 MG tablet  Commonly known as:  PROTONIX  Take 40 mg by mouth daily.     Pen Needles 3/16" 31G X 5 MM Misc  Use daily with your Lantus injection     polyethylene glycol packet  Commonly known as:  MIRALAX / GLYCOLAX  Take 17 g by mouth daily.     pyridOXINE 100 MG tablet  Commonly known as:  VITAMIN B-6  Take 100 mg by mouth daily.     senna 8.6 MG Tabs tablet  Commonly known as:  SENOKOT  Take 1 tablet (8.6 mg total) by mouth 2 (two) times daily.     sucralfate 1 GM/10ML suspension  Commonly known as:  CARAFATE  Take 10 mLs (1 g total) by mouth 4 (four) times daily -  with meals and at bedtime.     tamsulosin 0.4 MG Caps capsule  Commonly known as:  FLOMAX  Take 1 capsule (0.4 mg total) by mouth daily.     vitamin C 500 MG tablet  Commonly known as:  ASCORBIC ACID  Take 500 mg by mouth daily.         PHYSICAL EXAMINATION  Oncology Vitals 12/30/2014 12/23/2014 12/23/2014 12/23/2014 12/23/2014 12/23/2014 12/23/2014  Height - - - - - - 188 cm  Weight - - - - - - 77.429 kg  Weight (lbs) - - - - - - 170 lbs 11 oz  BMI (kg/m2) - - - - - - 21.92 kg/m2  Temp 98.5 97.3 97 96.8 96.6 97.6 97.5  Pulse 89 81 78 85 83 93 79  Resp 18 18 16 18 16 16 18   SpO2 100 100 - - - 100 100  BSA (m2) - - - - - - 2.01 m2   BP Readings from Last 3 Encounters:  12/30/14 107/68  12/23/14 97/67  12/23/14 114/66    Physical Exam  Constitutional: He is oriented to  person, place, and  time. He appears malnourished and dehydrated. He appears unhealthy. He appears cachectic.  Pt appears fatigued, weak, frail, and chronically ill.   HENT:  Head: Normocephalic and atraumatic.  Tongue with slight white coating to tongue. No oral lesions noted; and posterior throat with no erythema or exudate. Managing secretions with no difficulty; but does appear uncomfortable   Eyes: Conjunctivae and EOM are normal. Pupils are equal, round, and reactive to light. Right eye exhibits no discharge. Left eye exhibits no discharge. No scleral icterus.  Neck: Normal range of motion. Neck supple. No JVD present. No tracheal deviation present. No thyromegaly present.  Cardiovascular: Normal rate, regular rhythm, normal heart sounds and intact distal pulses.   Pulmonary/Chest: Effort normal and breath sounds normal. No stridor. No respiratory distress. He has no wheezes. He has no rales. He exhibits no tenderness.  Abdominal: Soft. Bowel sounds are normal. He exhibits no distension and no mass. There is no tenderness. There is no rebound and no guarding.  Musculoskeletal: Normal range of motion. He exhibits no edema or tenderness.  Lymphadenopathy:    He has no cervical adenopathy.  Neurological: He is alert and oriented to person, place, and time.  Pt appears very weak; but is able to ambulate with no assist.   Skin: Skin is warm and dry. No rash noted. No erythema. There is pallor.  Psychiatric: Affect normal.  Nursing note and vitals reviewed.   LABORATORY DATA:. Appointment on 12/30/2014  Component Date Value Ref Range Status  . WBC 12/30/2014 2.8* 4.0 - 10.3 10e3/uL Final  . NEUT# 12/30/2014 1.5  1.5 - 6.5 10e3/uL Final  . HGB 12/30/2014 9.7* 13.0 - 17.1 g/dL Final  . HCT 12/30/2014 28.9* 38.4 - 49.9 % Final  . Platelets 12/30/2014 94* 140 - 400 10e3/uL Final  . MCV 12/30/2014 91.2  79.3 - 98.0 fL Final  . MCH 12/30/2014 30.6  27.2 - 33.4 pg Final  . MCHC 12/30/2014 33.6   32.0 - 36.0 g/dL Final  . RBC 12/30/2014 3.17* 4.20 - 5.82 10e6/uL Final  . RDW 12/30/2014 17.1* 11.0 - 14.6 % Final  . lymph# 12/30/2014 0.9  0.9 - 3.3 10e3/uL Final  . MONO# 12/30/2014 0.3  0.1 - 0.9 10e3/uL Final  . Eosinophils Absolute 12/30/2014 0.0  0.0 - 0.5 10e3/uL Final  . Basophils Absolute 12/30/2014 0.0  0.0 - 0.1 10e3/uL Final  . NEUT% 12/30/2014 54.8  39.0 - 75.0 % Final  . LYMPH% 12/30/2014 33.3  14.0 - 49.0 % Final  . MONO% 12/30/2014 11.5  0.0 - 14.0 % Final  . EOS% 12/30/2014 0.4  0.0 - 7.0 % Final  . BASO% 12/30/2014 0.0  0.0 - 2.0 % Final  . nRBC 12/30/2014 0  0 - 0 % Final  . Sodium 12/30/2014 138  136 - 145 mEq/L Final  . Potassium 12/30/2014 3.7  3.5 - 5.1 mEq/L Final  . Chloride 12/30/2014 99  98 - 109 mEq/L Final  . CO2 12/30/2014 23  22 - 29 mEq/L Final  . Glucose 12/30/2014 130  70 - 140 mg/dl Final  . BUN 12/30/2014 9.2  7.0 - 26.0 mg/dL Final  . Creatinine 12/30/2014 0.5* 0.7 - 1.3 mg/dL Final  . Total Bilirubin 12/30/2014 0.43  0.20 - 1.20 mg/dL Final  . Alkaline Phosphatase 12/30/2014 279* 40 - 150 U/L Final  . AST 12/30/2014 49* 5 - 34 U/L Final  . ALT 12/30/2014 58* 0 - 55 U/L Final  . Total Protein 12/30/2014 6.2* 6.4 -  8.3 g/dL Final  . Albumin 12/30/2014 2.7* 3.5 - 5.0 g/dL Final  . Calcium 12/30/2014 9.3  8.4 - 10.4 mg/dL Final  . Anion Gap 12/30/2014 16* 3 - 11 mEq/L Final  . EGFR 12/30/2014 >90  >90 ml/min/1.73 m2 Final   eGFR is calculated using the CKD-EPI Creatinine Equation (2009)  . Magnesium 12/30/2014 1.1* 1.5 - 2.5 mg/dl Final     RADIOGRAPHIC STUDIES: No results found.  ASSESSMENT/PLAN:    Small cell lung cancer Patient presented to the [facility] today to receive cycle 2, day 1 of his cisplatin/irinotecan chemotherapy regimen.  Blood count has improved with ANC 1.5, hemoglobin 9.7, and platelet count back up to 94.  Patient has plans to return on 01/06/2015 for his next labs, follow up visit, and  chemotherapy.     Mucositis due to antineoplastic therapy Chemotherapy-induced mucositis.  C/o tenderness to entire tongue and posterior throat; worse with any swallowing of fluids. On exam- no specific oral lesions noted; and posterior throat with no erythema or exudate. Pt managing all secretions with no difficulty.  Patient continues to use Magic mouthwash with lidocaine as directed.    Peripheral edema Patient has increased bilateral lower extremity peripheral edema today.  Patient states that he typically sits with his legs hanging down on a daily basis; and rarely elevates his legs.   On exam.-Patient with +3 bilateral lower extremity peripheral edema. All pulses are palpable; and all extremities are warm.  There was no obvious shortness of breath or complaining of cough on exam.  Most likely, peripheral edema is secondary to hypoalbuminemia and chemotherapy.  Patient was encouraged to keep his feet elevated above the level of his heart when he is at rest.  Also, he may want to consider compression stockings when he is at work.  Will prescribe Lasix 10 mg every morning for the next 7 days to see if that helps.     Hypoalbuminemia Albumin further decreased to 2.7; and is most likely, the reason for increased peripheral edema.  Pt was encouraged to push protein in his diet as much as possible.      Antineoplastic chemotherapy induced pancytopenia Blood counts improved this week.  WBC 2.8, ANC 1.5, hemoglobin 9.7, platelet count 94.  Will proceed today with cycle 2, day 1 of his planned chemotherapy.   Hyperphosphatemia Alkaline phosphatase increased from 219 up to 279.  Will continue to monitor closely.   Transaminitis AST is increased from 41-49 and ALT has increased from 51-58.  Will continue to monitor closely.   Hypomagnesemia Magnesium has decreased from 1.6 down to 1.1.  Patient's pre-and post hydration IV fluids containing magnesium 1.5 g in total.  Patient will  also be ordered.  Magnesium 2 g IV to be administered tomorrow at the cancer center.  Patient will also continue with oral magnesium 400 mg twice daily as previously directed.    Patient stated understanding of all instructions; and was in agreement with this plan of care. The patient knows to call the clinic with any problems, questions or concerns.   Review/collaboration with Dr. Julien Nordmann regarding all aspects of patient's visit today.   Total time spent with patient was 25 minutes with greater than 75 percent of that time spent in face to face counseling regarding patient's symptoms,  and coordination of care and follow up.  Disclaimer: This note was dictated with voice recognition software. Similar sounding words can inadvertently be transcribed and may not be corrected upon review.  Drue Second, NP 12/30/2014

## 2014-12-30 NOTE — Progress Notes (Signed)
Pt reports swelling to bilateral feet x 2 days, no redness or tenderness noted. Denies worsening sob.  Selena Lesser, NP notified and will review patient labs as well prior to treatment. Pt verbalized understanding.   1120: Selena Lesser, NP at chairside to assess patient. Reviewed all labs; Okay to proceed with treatment today per NP.   7867: Pt reports pain to IV site.  Emend infusion stopped and IV flushed with NS, pt reports pain with flushing, unable to get good blood return from IV, no swelling noted to site.  IV d/c'd and restarted to right forearm.

## 2014-12-30 NOTE — Assessment & Plan Note (Signed)
Patient presented to the [facility] today to receive cycle 2, day 1 of his cisplatin/irinotecan chemotherapy regimen.  Blood count has improved with ANC 1.5, hemoglobin 9.7, and platelet count back up to 94.  Patient has plans to return on 01/06/2015 for his next labs, follow up visit, and chemotherapy.

## 2014-12-30 NOTE — Assessment & Plan Note (Signed)
Blood counts improved this week.  WBC 2.8, ANC 1.5, hemoglobin 9.7, platelet count 94.  Will proceed today with cycle 2, day 1 of his planned chemotherapy.

## 2014-12-30 NOTE — Assessment & Plan Note (Signed)
AST is increased from 41-49 and ALT has increased from 51-58.  Will continue to monitor closely.

## 2014-12-30 NOTE — Patient Instructions (Signed)
[facility] Discharge Instructions for Patients Receiving Chemotherapy  Today you received the following chemotherapy agents Cisplatin and Irinotecan  To help prevent nausea and vomiting after your treatment, we encourage you to take your nausea medication as directed    If you develop nausea and vomiting that is not controlled by your nausea medication, call the clinic.   BELOW ARE SYMPTOMS THAT SHOULD BE REPORTED IMMEDIATELY:  *FEVER GREATER THAN 100.5 F  *CHILLS WITH OR WITHOUT FEVER  NAUSEA AND VOMITING THAT IS NOT CONTROLLED WITH YOUR NAUSEA MEDICATION  *UNUSUAL SHORTNESS OF BREATH  *UNUSUAL BRUISING OR BLEEDING  TENDERNESS IN MOUTH AND THROAT WITH OR WITHOUT PRESENCE OF ULCERS  *URINARY PROBLEMS  *BOWEL PROBLEMS  UNUSUAL RASH Items with * indicate a potential emergency and should be followed up as soon as possible.  Feel free to call the clinic you have any questions or concerns. The clinic phone number is [phone].  Please show the CHEMO ALERT CARD at check-in to the Emergency Department and triage nurse.   

## 2014-12-30 NOTE — Assessment & Plan Note (Signed)
Albumin further decreased to 2.7; and is most likely, the reason for increased peripheral edema.  Pt was encouraged to push protein in his diet as much as possible.

## 2014-12-30 NOTE — Assessment & Plan Note (Signed)
Magnesium has decreased from 1.6 down to 1.1.  Patient's pre-and post hydration IV fluids containing magnesium 1.5 g in total.  Patient will also be ordered.  Magnesium 2 g IV to be administered tomorrow at the cancer center.  Patient will also continue with oral magnesium 400 mg twice daily as previously directed.

## 2014-12-30 NOTE — Progress Notes (Signed)
Patient was identified to be at risk for malnutrition on the MST secondary to poor appetite and weight loss.  54 year old male diagnosed with metastatic SCLC with mets to the bone.  PMH includes DM, HTN, Tobacco  Medications include lipitor, calcium with Vitamin D, decadron, Nexium, Lantus, Glucophage, MVI, Zofran, Protonix, Miralax, Compazine, Vitmain B6, Senekot, Carafate.  Labs include, Na 132, Glucose 370, Albumin 3.0.  Height:  '6\' 2"'$ . Weight: 170.7 pounds. UBW: 235 pounds February 2016. BMI: 21.91  Patient reports appetite improved. He is trying to increase oral intake.  He enjoys Boost and CIB. Diarrhea improved with 1-2 stools daily. Patient reports increased swelling in his feet.  States he is trying to reduce salt in his diet. Denies N and V.  Nutrition Diagnosis:   Severe Malnutrition related to unintentional weight loss of 27% in 3 months as evidenced by weight loss, inadequate oral intake of less than 75% over past month and depletion of muscle and fat stores.  Intervention: Educated patient to consume small meals and snacks 6 times daily. Recommend increasing oral nutrition supplements to TID between meals. Provided coupons for CIB and Boost. Encouraged increased protein foods and provided fact sheet on Increasing calories and protein. Questions were answered. Teach back method used. Contact information provided.  Monitoring, Evaluation, Goals:  Patient will increase oral intake to minimize depletion of lean muscle mass.  Next Visit: Friday, May 13, during infusion.

## 2014-12-30 NOTE — Assessment & Plan Note (Signed)
Alkaline phosphatase increased from 219 up to 279.  Will continue to monitor closely.

## 2014-12-31 ENCOUNTER — Ambulatory Visit (HOSPITAL_BASED_OUTPATIENT_CLINIC_OR_DEPARTMENT_OTHER): Payer: Federal, State, Local not specified - PPO

## 2014-12-31 VITALS — BP 125/79 | HR 92 | Temp 97.6°F | Resp 18

## 2014-12-31 DIAGNOSIS — C7B8 Other secondary neuroendocrine tumors: Secondary | ICD-10-CM | POA: Diagnosis not present

## 2014-12-31 DIAGNOSIS — C7A1 Malignant poorly differentiated neuroendocrine tumors: Secondary | ICD-10-CM | POA: Diagnosis not present

## 2014-12-31 MED ORDER — SODIUM CHLORIDE 0.9 % IV SOLN
2.0000 g | Freq: Once | INTRAVENOUS | Status: AC
  Administered 2014-12-31: 2 g via INTRAVENOUS

## 2014-12-31 MED ORDER — SODIUM CHLORIDE 0.9 % IV SOLN
INTRAVENOUS | Status: DC
  Administered 2014-12-31: 09:00:00 via INTRAVENOUS

## 2014-12-31 NOTE — Patient Instructions (Signed)
Hypomagnesemia Magnesium is a common ion (mineral) in the body which is needed for metabolism. It is about how the body handles food and other chemical reactions necessary for life. Only about 2% of the magnesium in our body is found in the blood. When this is low, it is called hypomagnesemia. The blood will measure only a tiny amount of the magnesium in our body. When it is low in our blood, it does not mean that the whole body supply is low. The normal serum concentration ranges from 1.8-2.5 mEq/L. When the level gets to be less than 1.0 mEq/L, a number of problems begin to happen.  CAUSES   Receiving intravenous fluids without magnesium replacement.  Loss of magnesium from the bowel by nasogastric suction.  Loss of magnesium from nausea and vomiting or severe diarrhea. Any of the inflammatory bowel conditions can cause this.  Abuse of alcohol often leads to low serum magnesium.  An inherited form of magnesium loss happens when the kidneys lose magnesium. This is called familial or primary hypomagnesemia.  Some medications such as diuretics also cause the loss of magnesium. SYMPTOMS  These following problems are worse if the changes in magnesium levels come on suddenly.  Tremor.  Confusion.  Muscle weakness.  Oversensitive to sights and sounds.  Sensitive reflexes.  Depression.  Muscular fibrillations.  Overreactivity of the nerves.  Irritability.  Psychosis.  Spasms of the hand muscles.  Tetany (where the muscles go into uncontrollable spasms). DIAGNOSIS  This condition can be diagnosed by blood tests. TREATMENT   In an emergency, magnesium can be given intravenously (by vein).  If the condition is less worrisome, it can be corrected by diet. High levels of magnesium are found in green leafy vegetables, peas, beans, and nuts among other things. It can also be given through medications by mouth.  If it is being caused by medications, changes can be made.  If  alcohol is a problem, help is available if there are difficulties giving it up. Document Released: 05/08/2005 Document Revised: 12/27/2013 Document Reviewed: 04/01/2008 [facility] Patient Information 2015 [place], [place]. This information is not intended to replace advice given to you by your health care provider. Make sure you discuss any questions you have with your health care provider.

## 2015-01-02 ENCOUNTER — Ambulatory Visit: Payer: Federal, State, Local not specified - PPO

## 2015-01-03 ENCOUNTER — Telehealth: Payer: Self-pay

## 2015-01-03 ENCOUNTER — Other Ambulatory Visit: Payer: Self-pay | Admitting: *Deleted

## 2015-01-03 DIAGNOSIS — C3490 Malignant neoplasm of unspecified part of unspecified bronchus or lung: Secondary | ICD-10-CM

## 2015-01-03 DIAGNOSIS — R1310 Dysphagia, unspecified: Secondary | ICD-10-CM

## 2015-01-03 DIAGNOSIS — D6481 Anemia due to antineoplastic chemotherapy: Secondary | ICD-10-CM

## 2015-01-03 DIAGNOSIS — C7951 Secondary malignant neoplasm of bone: Secondary | ICD-10-CM

## 2015-01-03 DIAGNOSIS — T451X5A Adverse effect of antineoplastic and immunosuppressive drugs, initial encounter: Secondary | ICD-10-CM

## 2015-01-03 MED ORDER — OXYCODONE HCL 10 MG PO TABS: 10.0000 mg | ORAL_TABLET | ORAL | Status: DC | PRN

## 2015-01-03 NOTE — Telephone Encounter (Signed)
Refill: Oxycodone 10 mg. Patient notified and verbalized understanding.

## 2015-01-03 NOTE — Telephone Encounter (Signed)
S/w [name]. She was checking in b/c last week said possibly checking labs mid week. She said pt is doing about the same, is not worse. Again encouraged elevation of legs. Pt sits with legs off side of bed a lot. S/w cyndee and no labs are necessary at present. He does have appt on 5/13 they will keep.

## 2015-01-06 ENCOUNTER — Encounter: Payer: Self-pay | Admitting: Physician Assistant

## 2015-01-06 ENCOUNTER — Ambulatory Visit (HOSPITAL_BASED_OUTPATIENT_CLINIC_OR_DEPARTMENT_OTHER): Payer: Federal, State, Local not specified - PPO

## 2015-01-06 ENCOUNTER — Ambulatory Visit: Payer: Federal, State, Local not specified - PPO | Admitting: Nutrition

## 2015-01-06 ENCOUNTER — Telehealth: Payer: Self-pay | Admitting: Physician Assistant

## 2015-01-06 ENCOUNTER — Ambulatory Visit (HOSPITAL_BASED_OUTPATIENT_CLINIC_OR_DEPARTMENT_OTHER): Payer: Federal, State, Local not specified - PPO | Admitting: Physician Assistant

## 2015-01-06 ENCOUNTER — Telehealth: Payer: Self-pay | Admitting: *Deleted

## 2015-01-06 ENCOUNTER — Other Ambulatory Visit (HOSPITAL_BASED_OUTPATIENT_CLINIC_OR_DEPARTMENT_OTHER): Payer: Federal, State, Local not specified - PPO

## 2015-01-06 VITALS — BP 102/67 | HR 91 | Temp 98.0°F | Resp 19 | Ht 72.0 in | Wt 185.8 lb

## 2015-01-06 DIAGNOSIS — T451X5A Adverse effect of antineoplastic and immunosuppressive drugs, initial encounter: Secondary | ICD-10-CM

## 2015-01-06 DIAGNOSIS — R1310 Dysphagia, unspecified: Secondary | ICD-10-CM | POA: Diagnosis not present

## 2015-01-06 DIAGNOSIS — C7B8 Other secondary neuroendocrine tumors: Secondary | ICD-10-CM

## 2015-01-06 DIAGNOSIS — C7A1 Malignant poorly differentiated neuroendocrine tumors: Secondary | ICD-10-CM

## 2015-01-06 DIAGNOSIS — D6481 Anemia due to antineoplastic chemotherapy: Secondary | ICD-10-CM

## 2015-01-06 DIAGNOSIS — C3492 Malignant neoplasm of unspecified part of left bronchus or lung: Secondary | ICD-10-CM

## 2015-01-06 DIAGNOSIS — R600 Localized edema: Secondary | ICD-10-CM

## 2015-01-06 DIAGNOSIS — C3490 Malignant neoplasm of unspecified part of unspecified bronchus or lung: Secondary | ICD-10-CM

## 2015-01-06 DIAGNOSIS — C7951 Secondary malignant neoplasm of bone: Secondary | ICD-10-CM

## 2015-01-06 DIAGNOSIS — R609 Edema, unspecified: Secondary | ICD-10-CM

## 2015-01-06 DIAGNOSIS — Z5111 Encounter for antineoplastic chemotherapy: Secondary | ICD-10-CM | POA: Diagnosis not present

## 2015-01-06 LAB — COMPREHENSIVE METABOLIC PANEL (CC13)
ALT: 45 U/L (ref 0–55)
AST: 35 U/L — ABNORMAL HIGH (ref 5–34)
Albumin: 2.8 g/dL — ABNORMAL LOW (ref 3.5–5.0)
Alkaline Phosphatase: 246 U/L — ABNORMAL HIGH (ref 40–150)
Anion Gap: 11 mEq/L (ref 3–11)
BUN: 14.8 mg/dL (ref 7.0–26.0)
CHLORIDE: 100 meq/L (ref 98–109)
CO2: 26 meq/L (ref 22–29)
CREATININE: 0.6 mg/dL — AB (ref 0.7–1.3)
Calcium: 8.3 mg/dL — ABNORMAL LOW (ref 8.4–10.4)
EGFR: >90 mL/min/{1.73_m2} (ref >90)
Glucose: 145 mg/dl — ABNORMAL HIGH (ref 70–140)
POTASSIUM: 3.9 meq/L (ref 3.5–5.1)
SODIUM: 136 meq/L (ref 136–145)
Total Bilirubin: 0.36 mg/dL (ref 0.20–1.20)
Total Protein: 5.9 g/dL — ABNORMAL LOW (ref 6.4–8.3)

## 2015-01-06 LAB — CBC WITH DIFFERENTIAL/PLATELET
BASO%: 0.6 % (ref 0.0–2.0)
BASOS ABS: 0 10*3/uL (ref 0.0–0.1)
EOS ABS: 0 10*3/uL (ref 0.0–0.5)
EOS%: 0.4 % (ref 0.0–7.0)
HCT: 27.2 % — ABNORMAL LOW (ref 38.4–49.9)
HGB: 9.2 g/dL — ABNORMAL LOW (ref 13.0–17.1)
LYMPH%: 20.8 % (ref 14.0–49.0)
MCH: 30.9 pg (ref 27.2–33.4)
MCHC: 33.8 g/dL (ref 32.0–36.0)
MCV: 91.6 fL (ref 79.3–98.0)
MONO#: 0.3 10*3/uL (ref 0.1–0.9)
MONO%: 11.3 % (ref 0.0–14.0)
NEUT#: 1.8 10*3/uL (ref 1.5–6.5)
NEUT%: 66.9 % (ref 39.0–75.0)
Platelets: 165 10*3/uL (ref 140–400)
RBC: 2.97 10*6/uL — AB (ref 4.20–5.82)
RDW: 17.8 % — ABNORMAL HIGH (ref 11.0–14.6)
WBC: 2.6 10*3/uL — AB (ref 4.0–10.3)
lymph#: 0.5 10*3/uL — ABNORMAL LOW (ref 0.9–3.3)

## 2015-01-06 LAB — TECHNOLOGIST REVIEW

## 2015-01-06 LAB — MAGNESIUM (CC13): Magnesium: 1.6 mg/dl (ref 1.5–2.5)

## 2015-01-06 MED ORDER — FUROSEMIDE 20 MG PO TABS: ORAL_TABLET | ORAL | Status: DC

## 2015-01-06 MED ORDER — PALONOSETRON HCL INJECTION 0.25 MG/5ML
0.2500 mg | Freq: Once | INTRAVENOUS | Status: AC
  Administered 2015-01-06: 0.25 mg via INTRAVENOUS

## 2015-01-06 MED ORDER — FOSAPREPITANT DIMEGLUMINE INJECTION 150 MG
Freq: Once | INTRAVENOUS | Status: AC
  Administered 2015-01-06: 14:00:00 via INTRAVENOUS
  Filled 2015-01-06: qty 5

## 2015-01-06 MED ORDER — PALONOSETRON HCL INJECTION 0.25 MG/5ML
INTRAVENOUS | Status: AC
  Filled 2015-01-06: qty 5

## 2015-01-06 MED ORDER — POTASSIUM CHLORIDE 2 MEQ/ML IV SOLN
Freq: Once | INTRAVENOUS | Status: AC
  Administered 2015-01-06: 11:00:00 via INTRAVENOUS
  Filled 2015-01-06: qty 10

## 2015-01-06 MED ORDER — SODIUM CHLORIDE 0.9 % IV SOLN
Freq: Once | INTRAVENOUS | Status: AC
  Administered 2015-01-06: 11:00:00 via INTRAVENOUS

## 2015-01-06 MED ORDER — ATROPINE SULFATE 1 MG/ML IJ SOLN: 0.5000 mg | Freq: Once | INTRAMUSCULAR | Status: DC | PRN

## 2015-01-06 MED ORDER — DEXTROSE 5 % IV SOLN
65.0000 mg/m2 | Freq: Once | INTRAVENOUS | Status: AC
  Administered 2015-01-06: 140 mg via INTRAVENOUS
  Filled 2015-01-06: qty 7

## 2015-01-06 MED ORDER — POTASSIUM CHLORIDE CRYS ER 20 MEQ PO TBCR: 20.0000 meq | EXTENDED_RELEASE_TABLET | Freq: Every day | ORAL | Status: DC

## 2015-01-06 MED ORDER — SODIUM CHLORIDE 0.9 % IV SOLN
30.0000 mg/m2 | Freq: Once | INTRAVENOUS | Status: AC
  Administered 2015-01-06: 65 mg via INTRAVENOUS
  Filled 2015-01-06: qty 65

## 2015-01-06 NOTE — Progress Notes (Addendum)
[place] [phone]   Fax:([phone]) [place], MD [address] [place] [address]  DIAGNOSIS: Extensive stage, Stage IV (T2a, N1, M1b) small cell lung cancer diagnosed in July of 2015.  PRIOR THERAPY:  1) Systemic chemotherapy with carboplatin for AUC of 5 on day 1 and etoposide at 120 mg/M2 on days 1, 2 and 3 with Neulasta support on day 4. Status post 6 cycles.  2) prophylactic cranial irradiation under the care of Dr. Sondra Come. 3) palliative radiotherapy to the T7-spine completed on 09/06/2014.  CURRENT THERAPY: Systemic chemotherapy with cisplatin 30 mg/m2 and irinotecan 65 mg /m2 given on days 1 and 8 every 3 weeks. Status post 1 cycle and day 1 of cycle #2.  INTERVAL HISTORY: Kerry Peck 54 y.o. male returns to the clinic today for follow up visit. His recent restaging CT scan showed evidence for disease progression. He is currently being treated with systemic chemotherapy with cisplatin and irinotecan. He is status post 1 cycle. He completed his palliative radiation to the upper and lower thoracic spine on 12/13/2014. His back pain has improved. He presents in a wheelchair and has a foley catheter.He denied having any significant fever or chills, no nausea or vomiting. He denied having any significant weight loss or night sweats. The patient has no significant chest pain but continues to have shortness of breath with exertion with no cough or hemoptysis. He complains of having trouble sleeping. He complains of having no appetite, trouble swallowing and heartburn. He reports that he is taking his Protonix at night. He also complains of swelling in his feet not significantly improved with Lasix 20 mg daily. He presents to proceed with day 8 of cycle #2.  MEDICAL HISTORY: Past Medical History  Diagnosis Date  . Diabetes mellitus without complication   . Hypertension   . Smoking addiction   .  Cancer     lung and liver  . Radiation 08/16/14-09/06/14    prophylatic cranial radiation 25 gray, chest, t-spine  35 gray  . Radiation 09/29/14-10/12/14    left hilar area 30 gray, right pelvis 30 gray, right lower rib cage area 30 gray  . Metastatic carcinoma to bone 11/23/2014    ALLERGIES:  has No Known Allergies.  MEDICATIONS:  Current Outpatient Prescriptions  Medication Sig Dispense Refill  . albuterol (PROVENTIL HFA;VENTOLIN HFA) 108 (90 BASE) MCG/ACT inhaler Inhale 2 puffs into the lungs every 6 (six) hours as needed for wheezing. 1 Inhaler 0  . aspirin EC 81 MG tablet Take 81 mg by mouth as directed. Take 1 tablet all days except Wednesday    . atorvastatin (LIPITOR) 40 MG tablet Take 40 mg by mouth daily.    . calcium-vitamin D (OSCAL WITH D) 500-200 MG-UNIT per tablet Take 1 tablet by mouth daily with breakfast.     . dexamethasone (DECADRON) 4 MG tablet Take 1 tablet (4 mg total) by mouth 2 (two) times daily. 20 tablet 0  . emollient (BIAFINE) cream Apply topically as needed. (Patient taking differently: Apply 1 application topically as needed. For radiation itch.) 454 g 0  . Fluticasone-Salmeterol (ADVAIR) 250-50 MCG/DOSE AEPB Inhale 1 puff into the lungs every 12 (twelve) hours.    . furosemide (LASIX) 20 MG tablet Take 2 tablets by mouth daily or as instructed 30 tablet 0  . Insulin Glargine (LANTUS) 100 UNIT/ML Solostar Pen Inject 15 Units into the skin daily. 15 mL  0  . Insulin Pen Needle (PEN NEEDLES 3/16") 31G X 5 MM MISC Use daily with your Lantus injection 90 each 0  . magnesium oxide (MAG-OX) 400 (241.3 MG) MG tablet Take 1 tablet (400 mg total) by mouth 2 (two) times daily. 60 tablet 0  . metFORMIN (GLUCOPHAGE) 500 MG tablet Take 1,000 mg by mouth 2 (two) times daily with a meal.     . Multiple Vitamins-Minerals (CENTRUM SILVER ADULT 50+ PO) Take 1 tablet by mouth daily.     . ONE TOUCH ULTRA TEST test strip 1 each by Other route 3 (three) times daily.     . Oxycodone  HCl 10 MG TABS Take 1-2 tablets (10-20 mg total) by mouth every 4 (four) hours as needed. 60 tablet 0  . OxyCODONE HCl ER 60 MG T12A Take 60 mg by mouth every 12 (twelve) hours. 60 each 0  . pantoprazole (PROTONIX) 40 MG tablet Take 40 mg by mouth daily.     . polyethylene glycol (MIRALAX / GLYCOLAX) packet Take 17 g by mouth daily. 14 each 0  . pyridOXINE (VITAMIN B-6) 100 MG tablet Take 100 mg by mouth daily.    Marland Kitchen senna (SENOKOT) 8.6 MG TABS tablet Take 1 tablet (8.6 mg total) by mouth 2 (two) times daily. 120 each 0  . sucralfate (CARAFATE) 1 GM/10ML suspension Take 10 mLs (1 g total) by mouth 4 (four) times daily -  with meals and at bedtime. 420 mL 1  . vitamin C (ASCORBIC ACID) 500 MG tablet Take 500 mg by mouth daily.    . Alum & Mag Hydroxide-Simeth (MAGIC MOUTHWASH W/LIDOCAINE) SOLN Take 5 mLs by mouth 4 (four) times daily as needed for mouth pain ([facility] mouthwash 1:1). (Patient not taking: Reported on 01/06/2015) 240 mL 1  . amLODipine (NORVASC) 10 MG tablet Take 10 mg by mouth daily.    . Blood Pressure Monitoring (5 SERIES BP MONITOR) DEVI     . esomeprazole (NEXIUM) 20 MG capsule Take 20 mg by mouth daily as needed (GERD).     . fenofibrate 160 MG tablet Take 160 mg by mouth daily.    . fluconazole (DIFLUCAN) 100 MG tablet Take 1 tablet (100 mg total) by mouth daily. Take 2 tablets on day 1 (Patient not taking: Reported on 01/06/2015) 8 tablet 0  . metoprolol tartrate (LOPRESSOR) 25 MG tablet Take 25 mg by mouth daily.     . ondansetron (ZOFRAN) 8 MG tablet Take 8 mg by mouth every 8 (eight) hours as needed for nausea or vomiting. Disp. 20 tablets. 1 refill.  Called in to [facility] per Dr. Sondra Come on 11/24/14.    Marland Kitchen potassium chloride SA (K-DUR,KLOR-CON) 20 MEQ tablet Take 1 tablet (20 mEq total) by mouth daily. 30 tablet 0  . tamsulosin (FLOMAX) 0.4 MG CAPS capsule Take 1 capsule (0.4 mg total) by mouth daily. (Patient not taking: Reported on 01/06/2015) 30 capsule 0   No current  facility-administered medications for this visit.   Facility-Administered Medications Ordered in Other Visits  Medication Dose Route Frequency Provider Last Rate Last Dose  . atropine injection 0.5 mg  0.5 mg Intravenous Once PRN Curt Bears, MD      . CISplatin (PLATINOL) 65 mg in sodium chloride 0.9 % 250 mL chemo infusion  30 mg/m2 (Treatment Plan Actual) Intravenous Once Curt Bears, MD      . irinotecan (CAMPTOSAR) 140 mg in dextrose 5 % 500 mL chemo infusion  65 mg/m2 (Treatment Plan Actual) Intravenous  Once Curt Bears, MD 338 mL/hr at 01/06/15 1416 140 mg at 01/06/15 1416  . morphine 4 MG/ML injection 2 mg  2 mg Intravenous Q30 min PRN Susanne Borders, NP   2 mg at 11/23/14 1345    REVIEW OF SYSTEMS:  Constitutional: positive for anorexia, fatigue and weight loss Eyes: negative Ears, nose, mouth, throat, and face: negative Respiratory: positive for dyspnea on exertion Cardiovascular: negative Gastrointestinal: positive for diarrhea, dyspepsia, dysphagia and odynophagia Genitourinary:negative Integument/breast: negative Hematologic/lymphatic: negative Musculoskeletal:positive for back pain and bone pain Neurological: negative Behavioral/Psych: positive for sleep disturbance Endocrine: negative Allergic/Immunologic: negative   PHYSICAL EXAMINATION: General appearance: alert, cooperative and no distress Head: Normocephalic, without obvious abnormality, atraumatic Neck: no adenopathy, no JVD, supple, symmetrical, trachea midline and thyroid not enlarged, symmetric, no tenderness/mass/nodules Lymph nodes: Cervical, supraclavicular, and axillary nodes normal. Resp: clear to auscultation bilaterally Back: symmetric, no curvature. ROM normal. No CVA tenderness. Cardio: regular rate and rhythm, S1, S2 normal, no murmur, click, rub or gallop GI: soft, non-tender; bowel sounds normal; no masses,  no organomegaly Extremities: edema 2 -3+ pitting edema bilateral lower  extremities diminishing to trace below the knees Neurologic: Grossly normal  ECOG PERFORMANCE STATUS: 2 - Symptomatic, <50% confined to bed  Blood pressure 102/67, pulse 91, temperature 98 F (36.7 C), temperature source Oral, resp. rate 19, height 6' (1.829 m), weight 185 lb 12.8 oz (84.278 kg), SpO2 100 %.  LABORATORY DATA: Lab Results  Component Value Date   WBC 2.6* 01/06/2015   HGB 9.2* 01/06/2015   HCT 27.2* 01/06/2015   MCV 91.6 01/06/2015   PLT 165 01/06/2015      Chemistry      Component Value Date/Time   NA 136 01/06/2015 0906   NA 132* 12/09/2014 1027   K 3.9 01/06/2015 0906   K 3.8 12/09/2014 1027   CL 97 12/09/2014 1027   CO2 26 01/06/2015 0906   CO2 26 12/09/2014 1027   BUN 14.8 01/06/2015 0906   BUN 18 12/09/2014 1027   CREATININE 0.6* 01/06/2015 0906   CREATININE 0.59 12/09/2014 1027      Component Value Date/Time   CALCIUM 8.3* 01/06/2015 0906   CALCIUM 8.1* 12/09/2014 1027   ALKPHOS 246* 01/06/2015 0906   ALKPHOS 151* 12/09/2014 1027   AST 35* 01/06/2015 0906   AST 54* 12/09/2014 1027   ALT 45 01/06/2015 0906   ALT 46 12/09/2014 1027   BILITOT 0.36 01/06/2015 0906   BILITOT 0.3 12/09/2014 1027       RADIOGRAPHIC STUDIES: No results found.  ASSESSMENT AND PLAN: this is a very pleasant 54 years old white male recently diagnosed with extensive stage small cell lung cancer status post 6 cycles of systemic chemotherapy with carboplatin and etoposide with partial response. This was followed by prophylactic cranial irradiation in addition to palliative radiotherapy to the T7 thoracic spine and the chest. The recent CT scan of the Chest, Abdomen and pelvis showed evidence for further disease progression with progressive osseous metastasis and tumor canal encroachment involving the thoracic spine as well as development of hepatic metastasis and nodal metastasis within the upper abdomen. He completed palliative radiation to the upper and lower thoracic  spine on 12/13/2014. He is currently being treated with systemic chemotherapy with cisplatin and irinotecan on days 1 and 8 every 3 weeks. He is status post 1 cycle and day 1 of cycle #2. He tolerated this first treatment well with the exception of diarrhea.  The patient was discussed with and  also seen by Dr. Julien Nordmann.  He is having significant difficulty with swallowing, likely related to his radiation therapy. We will refer him back to Dr. Michail Sermon for further evaluation of his difficulty swallowing. He was also advised to take his Protonix in the morning 30 minutes to an hour before his first meal of the day. He proceed with day 8 of cycle #2 today as scheduled. He will follow up in 2 weeks with a restaging Ct scan of his chest, abdomen and pelvis to re-evaluate his disease. For the lower extremity edema we will increase the Lasix to 40 mg by mouth daily in addition to KCl 20 meq by mouth daily. For pain management, he will continue on OxyContin and oxycodone for breakthrough pain.  The patient was advised to call immediately if he has any concerning symptoms in the interval. The patient voices understanding of current disease status and treatment options and is in agreement with the current care plan.  All questions were answered. The patient knows to call the clinic with any problems, questions or concerns. We can certainly see the patient much sooner if necessary.  Carlton Adam, PA-C 01/06/2015   ADDENDUM: Hematology/Oncology Attending: I had a face to face encounter with the patient. I recommended his care plan. This is a very pleasant 54 years old white male with extensive stage small cell lung cancer status post first-line chemotherapy with carboplatin and etoposide followed by prophylactic cranial irradiation as well as palliative radiotherapy to the T7 spine lesion.  The patient is currently undergoing second line chemotherapy with reduced dose cisplatin and irinotecan status post 1  cycle. He tolerated the first cycle of his treatment fairly well. He continues to have dysphagia and was referred to Dr. Michail Sermon but the patient missed his appointment. We will proceed with day 8 of the second cycle today as scheduled. The patient would come back for follow-up visit in 2 weeks for reevaluation after repeating CT scan of the chest, abdomen and pelvis for restaging of his disease. He was advised to call immediately if he has any concerning symptoms in the interval.  Disclaimer: This note was dictated with voice recognition software. Similar sounding words can inadvertently be transcribed and may be missed upon review.  [name], MD 01/09/2015

## 2015-01-06 NOTE — Progress Notes (Signed)
Nutrition follow-up completed with patient during chemotherapy.  He is being treated for metastatic small cell lung cancer. Weight has increased and was documented as 185.8 pounds on May 13 increased from 170.7 pounds April 29. Patient has bilateral lower leg swelling. Patient reports he is eating better. He has no questions or concerns.  Nutrition diagnosis: Severe malnutrition continues.  Intervention: Enforced importance of continued small frequent meals with high protein diet. Patient should continue oral nutrition supplements 3 times a day Encouraged low sodium foods. Questions were answered.  Teach back method used.  Monitoring, evaluation, goals: Patient will tolerate high protein, low sodium diet with oral nutrition supplements 3 times a day  Next visit: To be scheduled as needed.  Patient has my contact information  **Disclaimer: This note was dictated with voice recognition software. Similar sounding words can inadvertently be transcribed and this note may contain transcription errors which may not have been corrected upon publication of note.**

## 2015-01-06 NOTE — Patient Instructions (Signed)
[facility] Discharge Instructions for Patients Receiving Chemotherapy  Today you received the following chemotherapy agents: Irinotecan, Cisplatin  To help prevent nausea and vomiting after your treatment, we encourage you to take your nausea medication as prescribed by your physician.   If you develop nausea and vomiting that is not controlled by your nausea medication, call the clinic.   BELOW ARE SYMPTOMS THAT SHOULD BE REPORTED IMMEDIATELY:  *FEVER GREATER THAN 100.5 F  *CHILLS WITH OR WITHOUT FEVER  NAUSEA AND VOMITING THAT IS NOT CONTROLLED WITH YOUR NAUSEA MEDICATION  *UNUSUAL SHORTNESS OF BREATH  *UNUSUAL BRUISING OR BLEEDING  TENDERNESS IN MOUTH AND THROAT WITH OR WITHOUT PRESENCE OF ULCERS  *URINARY PROBLEMS  *BOWEL PROBLEMS  UNUSUAL RASH Items with * indicate a potential emergency and should be followed up as soon as possible.  Feel free to call the clinic you have any questions or concerns. The clinic phone number is [phone].  Please show the [place] at check-in to the Emergency Department and triage nurse.

## 2015-01-06 NOTE — Progress Notes (Signed)
Educated pt on importance of hydration; pt voices understanding but still resistant to teaching.

## 2015-01-06 NOTE — Telephone Encounter (Signed)
Pt confirmed labs/ov per 05/13 POF, gave pt AVS and Calendar.... KJ, sent msg to add chemo

## 2015-01-06 NOTE — Telephone Encounter (Signed)
Per staff message and POF I have scheduled appts. Advised scheduler of appts. JMW  

## 2015-01-08 ENCOUNTER — Other Ambulatory Visit: Payer: Self-pay | Admitting: Physician Assistant

## 2015-01-08 DIAGNOSIS — R790 Abnormal level of blood mineral: Secondary | ICD-10-CM

## 2015-01-08 NOTE — Patient Instructions (Signed)
Take Lasix 40 mg by mouth daily and potassium 20 meq by mouth daily Follow up in 2 weeks with a restaging CT scan of your chest, abdomen and pelvis to re-evaluate your disease

## 2015-01-12 ENCOUNTER — Telehealth: Payer: Self-pay | Admitting: Internal Medicine

## 2015-01-12 ENCOUNTER — Telehealth: Payer: Self-pay | Admitting: *Deleted

## 2015-01-12 DIAGNOSIS — T451X5A Adverse effect of antineoplastic and immunosuppressive drugs, initial encounter: Secondary | ICD-10-CM

## 2015-01-12 DIAGNOSIS — R1310 Dysphagia, unspecified: Secondary | ICD-10-CM

## 2015-01-12 DIAGNOSIS — D6481 Anemia due to antineoplastic chemotherapy: Secondary | ICD-10-CM

## 2015-01-12 DIAGNOSIS — C7951 Secondary malignant neoplasm of bone: Secondary | ICD-10-CM

## 2015-01-12 DIAGNOSIS — C3490 Malignant neoplasm of unspecified part of unspecified bronchus or lung: Secondary | ICD-10-CM

## 2015-01-12 MED ORDER — OXYCODONE HCL 10 MG PO TABS: 10.0000 mg | ORAL_TABLET | ORAL | Status: DC | PRN

## 2015-01-12 NOTE — Telephone Encounter (Signed)
rx locked for pickup and teresa notified .

## 2015-01-12 NOTE — Telephone Encounter (Signed)
VM message received requesting refill on 'one of oxy mediciations'.  Call back to [facility] to clarify. Left message on VM to return call to [facility] triage.

## 2015-01-12 NOTE — Telephone Encounter (Signed)
R/s 5/23 lab to 5/24 per patient/central due to ct being moved to 5/24. Per Peggy patient given new lab time for 5/24 @ 1:45pm.

## 2015-01-12 NOTE — Telephone Encounter (Signed)
Return call received from patient and his sister, [name].  "He needs the Oxy IR refilled.  Taking two pills at a time because he's having a hard time right now.  He's not eating and lying in bed.  Fett swelling noted with redness and cold.  He's better today so I don't think he needs to be seen.  He's trying to eat and one foot has gone down some."  Kerry Peck says he can walk.  Will notify Dr. Julien Nordmann of this request.  [name] can be reached at [phone].

## 2015-01-16 ENCOUNTER — Other Ambulatory Visit: Payer: Federal, State, Local not specified - PPO

## 2015-01-17 ENCOUNTER — Other Ambulatory Visit: Payer: Self-pay

## 2015-01-17 ENCOUNTER — Encounter (HOSPITAL_COMMUNITY): Payer: Self-pay

## 2015-01-17 ENCOUNTER — Observation Stay (HOSPITAL_COMMUNITY)
Admission: EM | Admit: 2015-01-17 | Discharge: 2015-01-18 | Disposition: A | Discharge status: Home / Self Care | Payer: Federal, State, Local not specified - PPO | Attending: Internal Medicine | Admitting: Internal Medicine

## 2015-01-17 ENCOUNTER — Ambulatory Visit (HOSPITAL_COMMUNITY)
Admission: RE | Admit: 2015-01-17 | Discharge: 2015-01-17 | Disposition: A | Discharge status: Home / Self Care | Payer: Federal, State, Local not specified - PPO | Source: Ambulatory Visit | Attending: Physician Assistant | Admitting: Physician Assistant

## 2015-01-17 ENCOUNTER — Other Ambulatory Visit (HOSPITAL_BASED_OUTPATIENT_CLINIC_OR_DEPARTMENT_OTHER): Payer: Federal, State, Local not specified - PPO

## 2015-01-17 ENCOUNTER — Telehealth: Payer: Self-pay | Admitting: *Deleted

## 2015-01-17 DIAGNOSIS — C7A1 Malignant poorly differentiated neuroendocrine tumors: Secondary | ICD-10-CM | POA: Diagnosis not present

## 2015-01-17 DIAGNOSIS — R1310 Dysphagia, unspecified: Secondary | ICD-10-CM

## 2015-01-17 DIAGNOSIS — E119 Type 2 diabetes mellitus without complications: Secondary | ICD-10-CM

## 2015-01-17 DIAGNOSIS — C7951 Secondary malignant neoplasm of bone: Secondary | ICD-10-CM | POA: Diagnosis not present

## 2015-01-17 DIAGNOSIS — C3492 Malignant neoplasm of unspecified part of left bronchus or lung: Secondary | ICD-10-CM

## 2015-01-17 DIAGNOSIS — C3432 Malignant neoplasm of lower lobe, left bronchus or lung: Secondary | ICD-10-CM | POA: Diagnosis present

## 2015-01-17 DIAGNOSIS — D61810 Antineoplastic chemotherapy induced pancytopenia: Secondary | ICD-10-CM | POA: Diagnosis not present

## 2015-01-17 DIAGNOSIS — C3490 Malignant neoplasm of unspecified part of unspecified bronchus or lung: Secondary | ICD-10-CM | POA: Diagnosis present

## 2015-01-17 DIAGNOSIS — K5900 Constipation, unspecified: Secondary | ICD-10-CM | POA: Diagnosis present

## 2015-01-17 DIAGNOSIS — E785 Hyperlipidemia, unspecified: Secondary | ICD-10-CM | POA: Diagnosis present

## 2015-01-17 DIAGNOSIS — G893 Neoplasm related pain (acute) (chronic): Secondary | ICD-10-CM | POA: Diagnosis present

## 2015-01-17 DIAGNOSIS — E43 Unspecified severe protein-calorie malnutrition: Secondary | ICD-10-CM | POA: Diagnosis present

## 2015-01-17 DIAGNOSIS — R112 Nausea with vomiting, unspecified: Secondary | ICD-10-CM | POA: Diagnosis not present

## 2015-01-17 DIAGNOSIS — C787 Secondary malignant neoplasm of liver and intrahepatic bile duct: Secondary | ICD-10-CM | POA: Diagnosis not present

## 2015-01-17 DIAGNOSIS — I10 Essential (primary) hypertension: Secondary | ICD-10-CM | POA: Diagnosis present

## 2015-01-17 DIAGNOSIS — T451X5A Adverse effect of antineoplastic and immunosuppressive drugs, initial encounter: Secondary | ICD-10-CM

## 2015-01-17 DIAGNOSIS — D6481 Anemia due to antineoplastic chemotherapy: Secondary | ICD-10-CM

## 2015-01-17 HISTORY — DX: Malignant neoplasm of liver, not specified as primary or secondary: C22.9

## 2015-01-17 HISTORY — DX: Malignant neoplasm of unspecified part of unspecified bronchus or lung: C34.90

## 2015-01-17 LAB — GLUCOSE, CAPILLARY
GLUCOSE-CAPILLARY: 121 mg/dL — AB (ref 65–99)
Glucose-Capillary: 84 mg/dL (ref 65–99)

## 2015-01-17 LAB — COMPREHENSIVE METABOLIC PANEL (CC13)
ALBUMIN: 2.8 g/dL — AB (ref 3.5–5.0)
ALT: 33 U/L (ref 0–55)
ANION GAP: 12 meq/L — AB (ref 3–11)
AST: 42 U/L — ABNORMAL HIGH (ref 5–34)
Alkaline Phosphatase: 146 U/L (ref 40–150)
BUN: 6.2 mg/dL — AB (ref 7.0–26.0)
CALCIUM: 7.7 mg/dL — AB (ref 8.4–10.4)
CO2: 26 meq/L (ref 22–29)
Chloride: 98 mEq/L (ref 98–109)
Creatinine: 0.6 mg/dL — ABNORMAL LOW (ref 0.7–1.3)
EGFR: >90 mL/min/{1.73_m2} (ref >90)
Glucose: 114 mg/dl (ref 70–140)
POTASSIUM: 3.6 meq/L (ref 3.5–5.1)
Sodium: 136 mEq/L (ref 136–145)
Total Bilirubin: 1.1 mg/dL (ref 0.20–1.20)
Total Protein: 5.8 g/dL — ABNORMAL LOW (ref 6.4–8.3)

## 2015-01-17 LAB — MAGNESIUM (CC13): Magnesium: 1.4 mg/dl — CL (ref 1.5–2.5)

## 2015-01-17 LAB — CBC WITH DIFFERENTIAL/PLATELET
BASO%: 0.8 % (ref 0.0–2.0)
Basophils Absolute: 0 10*3/uL (ref 0.0–0.1)
EOS ABS: 0 10*3/uL (ref 0.0–0.5)
EOS%: 0.2 % (ref 0.0–7.0)
HCT: 17.8 % — ABNORMAL LOW (ref 38.4–49.9)
HEMOGLOBIN: 5.9 g/dL — AB (ref 13.0–17.1)
LYMPH%: 28.9 % (ref 14.0–49.0)
MCH: 30.3 pg (ref 27.2–33.4)
MCHC: 33.4 g/dL (ref 32.0–36.0)
MCV: 90.7 fL (ref 79.3–98.0)
MONO#: 0.2 10*3/uL (ref 0.1–0.9)
MONO%: 12.1 % (ref 0.0–14.0)
NEUT%: 58 % (ref 39.0–75.0)
NEUTROS ABS: 1 10*3/uL — AB (ref 1.5–6.5)
Platelets: 83 10*3/uL — ABNORMAL LOW (ref 140–400)
RBC: 1.97 10*6/uL — ABNORMAL LOW (ref 4.20–5.82)
RDW: 20.4 % — ABNORMAL HIGH (ref 11.0–14.6)
WBC: 1.8 10*3/uL — ABNORMAL LOW (ref 4.0–10.3)
lymph#: 0.5 10*3/uL — ABNORMAL LOW (ref 0.9–3.3)

## 2015-01-17 LAB — TROPONIN I: Troponin I: <0.03 ng/mL (ref <0.031)

## 2015-01-17 LAB — POCT I-STAT TROPONIN I: Troponin i, poc: 0 ng/mL (ref 0.00–0.08)

## 2015-01-17 LAB — PREPARE RBC (CROSSMATCH)

## 2015-01-17 MED ORDER — SODIUM CHLORIDE 0.9 % IV SOLN
Freq: Once | INTRAVENOUS | Status: AC
  Administered 2015-01-17: 17:00:00 via INTRAVENOUS

## 2015-01-17 MED ORDER — MOMETASONE FURO-FORMOTEROL FUM 100-5 MCG/ACT IN AERO
2.0000 | INHALATION_SPRAY | Freq: Two times a day (BID) | RESPIRATORY_TRACT | Status: DC
  Administered 2015-01-17 – 2015-01-18 (×2): 2 via RESPIRATORY_TRACT
  Filled 2015-01-17: qty 8.8

## 2015-01-17 MED ORDER — BENZOCAINE 10 % MT GEL
1.0000 "application " | Freq: Three times a day (TID) | OROMUCOSAL | Status: DC | PRN
  Filled 2015-01-17: qty 9.4

## 2015-01-17 MED ORDER — INSULIN GLARGINE 100 UNIT/ML ~~LOC~~ SOLN
15.0000 [IU] | Freq: Every day | SUBCUTANEOUS | Status: DC
  Filled 2015-01-17 (×2): qty 0.15

## 2015-01-17 MED ORDER — MORPHINE SULFATE 4 MG/ML IJ SOLN
4.0000 mg | Freq: Once | INTRAMUSCULAR | Status: AC
  Administered 2015-01-17: 4 mg via INTRAVENOUS
  Filled 2015-01-17: qty 1

## 2015-01-17 MED ORDER — HYDROCERIN EX CREA
1.0000 "application " | TOPICAL_CREAM | CUTANEOUS | Status: DC | PRN
  Filled 2015-01-17: qty 113

## 2015-01-17 MED ORDER — FUROSEMIDE 40 MG PO TABS
40.0000 mg | ORAL_TABLET | Freq: Every day | ORAL | Status: DC
  Administered 2015-01-17 – 2015-01-18 (×2): 40 mg via ORAL
  Filled 2015-01-17 (×2): qty 1

## 2015-01-17 MED ORDER — POLYETHYLENE GLYCOL 3350 17 G PO PACK
17.0000 g | PACK | Freq: Every day | ORAL | Status: DC
  Administered 2015-01-17: 17 g via ORAL

## 2015-01-17 MED ORDER — SENNA 8.6 MG PO TABS
1.0000 | ORAL_TABLET | Freq: Two times a day (BID) | ORAL | Status: DC
  Administered 2015-01-17: 8.6 mg via ORAL

## 2015-01-17 MED ORDER — ATORVASTATIN CALCIUM 40 MG PO TABS
40.0000 mg | ORAL_TABLET | Freq: Every day | ORAL | Status: DC
  Administered 2015-01-17 – 2015-01-18 (×2): 40 mg via ORAL
  Filled 2015-01-17 (×2): qty 1

## 2015-01-17 MED ORDER — POTASSIUM CHLORIDE CRYS ER 20 MEQ PO TBCR
20.0000 meq | EXTENDED_RELEASE_TABLET | Freq: Every day | ORAL | Status: DC
  Administered 2015-01-17 – 2015-01-18 (×2): 20 meq via ORAL
  Filled 2015-01-17 (×2): qty 1

## 2015-01-17 MED ORDER — IOHEXOL 300 MG/ML  SOLN
100.0000 mL | Freq: Once | INTRAMUSCULAR | Status: AC | PRN
  Administered 2015-01-17: 100 mL via INTRAVENOUS

## 2015-01-17 MED ORDER — OXYCODONE HCL 5 MG PO TABS
10.0000 mg | ORAL_TABLET | Freq: Four times a day (QID) | ORAL | Status: DC | PRN
Start: 2015-01-17 — End: 2015-01-18
  Administered 2015-01-17 (×2): 10 mg via ORAL
  Administered 2015-01-18 (×2): 20 mg via ORAL
  Filled 2015-01-17 (×2): qty 4
  Filled 2015-01-17 (×2): qty 2
  Filled 2015-01-17 (×3): qty 4

## 2015-01-17 MED ORDER — ENOXAPARIN SODIUM 40 MG/0.4ML ~~LOC~~ SOLN: 40.0000 mg | SUBCUTANEOUS | Status: DC

## 2015-01-17 MED ORDER — OXYCODONE HCL ER 20 MG PO T12A
60.0000 mg | EXTENDED_RELEASE_TABLET | Freq: Two times a day (BID) | ORAL | Status: DC
  Administered 2015-01-17 – 2015-01-18 (×2): 60 mg via ORAL
  Filled 2015-01-17 (×2): qty 3

## 2015-01-17 MED ORDER — ONDANSETRON HCL 4 MG PO TABS: 4.0000 mg | ORAL_TABLET | Freq: Four times a day (QID) | ORAL | Status: DC | PRN

## 2015-01-17 MED ORDER — ALBUTEROL SULFATE (2.5 MG/3ML) 0.083% IN NEBU: 2.5000 mg | INHALATION_SOLUTION | RESPIRATORY_TRACT | Status: DC | PRN

## 2015-01-17 MED ORDER — ALUM & MAG HYDROXIDE-SIMETH 200-200-20 MG/5ML PO SUSP
30.0000 mL | Freq: Four times a day (QID) | ORAL | Status: DC | PRN
  Administered 2015-01-17 – 2015-01-18 (×2): 30 mL via ORAL
  Filled 2015-01-17 (×2): qty 30

## 2015-01-17 MED ORDER — ALBUTEROL SULFATE HFA 108 (90 BASE) MCG/ACT IN AERS
2.0000 | INHALATION_SPRAY | Freq: Once | RESPIRATORY_TRACT | Status: AC
  Administered 2015-01-17: 2 via RESPIRATORY_TRACT
  Filled 2015-01-17: qty 6.7

## 2015-01-17 MED ORDER — SODIUM CHLORIDE 0.9 % IV SOLN
INTRAVENOUS | Status: DC
  Administered 2015-01-17: 50 mL/h via INTRAVENOUS

## 2015-01-17 MED ORDER — SUCRALFATE 1 GM/10ML PO SUSP
1.0000 g | Freq: Three times a day (TID) | ORAL | Status: DC | PRN
  Administered 2015-01-17: 1 g via ORAL
  Filled 2015-01-17 (×2): qty 10

## 2015-01-17 MED ORDER — PANTOPRAZOLE SODIUM 40 MG IV SOLR
40.0000 mg | Freq: Every day | INTRAVENOUS | Status: DC
  Administered 2015-01-17 – 2015-01-18 (×2): 40 mg via INTRAVENOUS
  Filled 2015-01-17 (×2): qty 40

## 2015-01-17 MED ORDER — MAGNESIUM SULFATE 2 GM/50ML IV SOLN
2.0000 g | Freq: Once | INTRAVENOUS | Status: AC
  Administered 2015-01-18: 2 g via INTRAVENOUS
  Filled 2015-01-17: qty 50

## 2015-01-17 MED ORDER — ONDANSETRON HCL 4 MG/2ML IJ SOLN
4.0000 mg | Freq: Four times a day (QID) | INTRAMUSCULAR | Status: DC | PRN
  Administered 2015-01-18: 4 mg via INTRAVENOUS
  Filled 2015-01-17: qty 2

## 2015-01-17 MED ORDER — SODIUM CHLORIDE 0.9 % IV SOLN: Freq: Once | INTRAVENOUS | Status: AC

## 2015-01-17 MED ORDER — INSULIN ASPART 100 UNIT/ML ~~LOC~~ SOLN: 0.0000 [IU] | Freq: Three times a day (TID) | SUBCUTANEOUS | Status: DC

## 2015-01-17 MED ORDER — CALCIUM CARBONATE-VITAMIN D 500-200 MG-UNIT PO TABS
1.0000 | ORAL_TABLET | Freq: Every day | ORAL | Status: DC
  Administered 2015-01-18: 1 via ORAL
  Filled 2015-01-17 (×2): qty 1

## 2015-01-17 MED ORDER — ASPIRIN EC 81 MG PO TBEC: 81.0000 mg | DELAYED_RELEASE_TABLET | ORAL | Status: DC

## 2015-01-17 MED ORDER — VITAMIN B-6 100 MG PO TABS
100.0000 mg | ORAL_TABLET | Freq: Every day | ORAL | Status: DC
  Administered 2015-01-17 – 2015-01-18 (×2): 100 mg via ORAL
  Filled 2015-01-17 (×2): qty 1

## 2015-01-17 MED ORDER — ACETAMINOPHEN 325 MG PO TABS
650.0000 mg | ORAL_TABLET | Freq: Four times a day (QID) | ORAL | Status: DC | PRN
  Filled 2015-01-17: qty 2

## 2015-01-17 MED ORDER — ACETAMINOPHEN 650 MG RE SUPP: 650.0000 mg | Freq: Four times a day (QID) | RECTAL | Status: DC | PRN

## 2015-01-17 NOTE — ED Notes (Signed)
Hospitalist and EDP at bedside.

## 2015-01-17 NOTE — Telephone Encounter (Signed)
Per MD Call to CT s/w Cecille Rubin, advised to send pt to ED after CT scan completed as HGB 5.9, pt will need blood transfusion. Call to ED spoke with Corene Cornea, advised per MD pt hgb 5.9 and he has been instructed to go to ED for blood transfusion. Pt will arrive to ED after CT scan completed.

## 2015-01-17 NOTE — H&P (Signed)
History and Physical  Kerry Peck YDX:[id number] DOB: [birth year] DOA: 01/17/2015  Referring physician: Etta Peck, ED PA-C PCP: Kerry Peck., MD  Outpatient Specialists:  1. Radiation Oncologist: Dr. Gery Peck 2. Medical Oncologist: Dr. Curt Peck  Chief Complaint: Weakness, nausea & vomiting.  HPI: Kerry Peck is a 54 y.o. male with h/o extensive stage small cell lung cancer, s/p 1st line chemotherapy, palliative cranial radiation and palliative radiation to T7, undergoing 2nd line chemotherapy with reduced dose Cisplatin and Irinotecan- completed 2nd dose on 01/06/2015, DM2, HTN, cancer related chronic pain on opoid's, former smoker, leg edema, lives with sister, ambulates with cane and walker, sent from the cancer center for symptomatic anemia for transfusion. He came for scheduled imaging studies and lab work revealed Hb 5.9. He gives h/o significant weakness, nausea and non bloody emesis about 10 days ago and stayed in bed for 2 days. Since then his symptoms have improved but still has intermittent nausea and non bloody emesis. He is able to tolerate some diet. Complains of teeth pain and occasional sore throat. No fevers or chills. No HA, earache, cough or dyspnea. Pain in several areas-teeth, under both rib cages on sitting for long, back and feet, all intermittent, mild-moderate without aggravating or relieving factors. No reported bleeding. Constipated. No dysuria or urinary frequency. In the ED, pancytopenic with Hb 5.9, WBC 1.8 with ANC 1, Platelets 83. Hospitalist admission requested.   Review of Systems: All systems reviewed and apart from history of presenting illness, are negative.  Past Medical History  Diagnosis Date  . Diabetes mellitus without complication   . Hypertension   . Smoking addiction   . Cancer     lung and liver  . Radiation 08/16/14-09/06/14    prophylatic cranial radiation 25 gray, chest, t-spine  35 gray  . Radiation  09/29/14-10/12/14    left hilar area 30 gray, right pelvis 30 gray, right lower rib cage area 30 gray  . 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 Lung cancer   . Liver cancer    Past Surgical History  Procedure Laterality Date  . Arm surgery     Social History:  reports that he quit smoking about 7 months ago. His smoking use included Cigarettes. He has a 22.5 pack-year smoking history. He has never used smokeless tobacco. He reports that he drinks about 18.0 oz of alcohol per week. He reports that he does not use illicit drugs. Quit smoking in Dec. Has not consumed alcohol in 4 months.   No Known Allergies  Family History  Problem Relation Age of Onset  . Pancreatic cancer Mother   Mother deceased.  Prior to Admission medications   Medication Sig Start Date End Date Taking? Authorizing Provider  albuterol (PROVENTIL HFA;VENTOLIN HFA) 108 (90 BASE) MCG/ACT inhaler Inhale 2 puffs into the lungs every 6 (six) hours as needed for wheezing. 08/09/12  Yes Kerry Koyanagi, MD  alum & mag hydroxide-simeth (MAALOX/MYLANTA) 200-200-20 MG/5ML suspension Take 30 mLs by mouth every 6 (six) hours as needed for indigestion or heartburn (indigestion).   Yes Historical Provider, MD  aspirin EC 81 MG tablet Take 81 mg by mouth as directed. Take 1 tablet all days except Wednesday   Yes Historical Provider, MD  atorvastatin (LIPITOR) 40 MG tablet Take 40 mg by mouth daily.   Yes Historical Provider, MD  benzocaine (ORAJEL) 10 % mucosal gel Use as directed 1 application in the mouth or throat 3 (three) times daily as needed  for mouth pain (mouth pain).   Yes Historical Provider, MD  bismuth subsalicylate (PEPTO BISMOL) 262 MG/15ML suspension Take 30 mLs by mouth every 6 (six) hours as needed for indigestion (indigestion).   Yes Historical Provider, MD  calcium-vitamin D (OSCAL WITH D) 500-200 MG-UNIT per tablet Take 1 tablet by mouth daily with breakfast.    Yes Historical Provider, MD  emollient (BIAFINE)  cream Apply topically as needed. Patient taking differently: Apply 1 application topically as needed. For radiation itch. 08/16/14  Yes Kerry Pray, MD  Fluticasone-Salmeterol (ADVAIR) 250-50 MCG/DOSE AEPB Inhale 2 puffs into the lungs 2 (two) times daily as needed (wheezing).    Yes Historical Provider, MD  furosemide (LASIX) 20 MG tablet Take 2 tablets by mouth daily or as instructed 01/06/15  Yes Kerry E Johnson, PA-C  Insulin Glargine (LANTUS) 100 UNIT/ML Solostar Pen Inject 15 Units into the skin daily. 11/29/14  Yes Kerry Patricia, MD  Insulin Pen Needle (PEN NEEDLES 3/16") 31G X 5 MM MISC Use daily with your Lantus injection 11/29/14  Yes Kerry S Elgergawy, MD  MAGNESIUM-OXIDE 400 (241.3 MG) MG tablet TAKE 1 TABLET BY MOUTH TWICE DAILY Patient taking differently: TAKE 1 TABLET BY MOUTH DAILY 01/09/15  Yes Kerry E Johnson, PA-C  metFORMIN (GLUCOPHAGE) 1000 MG tablet Take 1,000 mg by mouth 2 (two) times daily with a meal.   Yes Historical Provider, MD  Multiple Vitamins-Minerals (CENTRUM Kerry ADULT 50+ PO) Take 1 tablet by mouth daily.    Yes Historical Provider, MD  ondansetron (ZOFRAN) 8 MG tablet Take 8 mg by mouth every 8 (eight) hours as needed for nausea or vomiting (nause and vomiting). Disp. 20 tablets. 1 refill.  Called in to [facility] per Dr. Sondra Come on 11/24/14.   Yes Historical Provider, MD  ONE TOUCH ULTRA TEST test strip 1 each by Other route 3 (three) times daily.  04/11/14  Yes Historical Provider, MD  Oxycodone HCl 10 MG TABS Take 1-2 tablets (10-20 mg total) by mouth every 4 (four) hours as needed. 01/12/15  Yes Kerry Bears, MD  OxyCODONE HCl ER 60 MG T12A Take 60 mg by mouth every 12 (twelve) hours. 12/23/14  Yes Kerry E Johnson, PA-C  pantoprazole (PROTONIX) 40 MG tablet Take 40 mg by mouth daily.  05/19/14  Yes Historical Provider, MD  polyethylene glycol (MIRALAX / GLYCOLAX) packet Take 17 g by mouth daily. 11/29/14  Yes Kerry Huguenin Elgergawy, MD  potassium chloride SA  (K-DUR,KLOR-CON) 20 MEQ tablet Take 1 tablet (20 mEq total) by mouth daily. 01/06/15  Yes Kerry E Johnson, PA-C  pyridOXINE (VITAMIN B-6) 100 MG tablet Take 100 mg by mouth daily.   Yes Historical Provider, MD  senna (SENOKOT) 8.6 MG TABS tablet Take 1 tablet (8.6 mg total) by mouth 2 (two) times daily. 11/29/14  Yes Kerry Huguenin Elgergawy, MD  sucralfate (CARAFATE) 1 GM/10ML suspension Take 10 mLs (1 g total) by mouth 4 (four) times daily -  with meals and at bedtime. Patient taking differently: Take 1 g by mouth 3 (three) times daily as needed (throat pain).  12/27/14  Yes Thea Silversmith, MD  vitamin C (ASCORBIC ACID) 500 MG tablet Take 500 mg by mouth daily.   Yes Historical Provider, MD  amLODipine (NORVASC) 10 MG tablet Take 10 mg by mouth daily.  01/14/15   Historical Provider, MD   Physical Exam: Filed Vitals:   01/17/15 1526 01/17/15 1708  BP: 92/55 96/69  Pulse: 92 94  Temp: 98.2 F (36.8 C)  TempSrc: Oral   Resp: 18 17  SpO2: 100% 100%     General exam: Moderately built and thinly nourished pleasant middle aged male patient, lying comfortably propped up on the gurney in no obvious distress.  Head, eyes and ENT: Nontraumatic and normocephalic. Pupils equally reacting to light and accommodation. Oral mucosa moist. Throat exam without acute findings and no oral thrush. Multiple dental caries, esp left side  Neck: Supple. No JVD, carotid bruit or thyromegaly.  Lymphatics: No lymphadenopathy.  Respiratory system: Diminished BS's b/l with occasional expiratory rhochi. No increased work of breathing.  Cardiovascular system: S1 and S2 heard, RRR. No JVD, murmurs, gallops, clicks. Trace ankle edema.  Gastrointestinal system: Abdomen is nondistended, soft and nontender. Normal bowel sounds heard. No organomegaly or masses appreciated.  Central nervous system: Alert and oriented. No focal neurological deficits.  Extremities: Symmetric 5 x 5 power. Peripheral pulses symmetrically felt.     Skin: No rashes or acute findings. Mild superficial bruising of b/l anterior ankles with acute findings suggestive of infection.  Musculoskeletal system: Negative exam.  Psychiatry: Pleasant and cooperative.   Labs on Admission:  Basic Metabolic Panel:  Recent Labs Lab 01/17/15 1428  NA 136  K 3.6  CO2 26  GLUCOSE 114  BUN 6.2*  CREATININE 0.6*  CALCIUM 7.7*  MG 1.4*   Liver Function Tests:  Recent Labs Lab 01/17/15 1428  AST 42*  ALT 33  ALKPHOS 146  BILITOT 1.10  PROT 5.8*  ALBUMIN 2.8*   No results for input(s): LIPASE, AMYLASE in the last 168 hours. No results for input(s): AMMONIA in the last 168 hours. CBC:  Recent Labs Lab 01/17/15 1425  WBC 1.8*  NEUTROABS 1.0*  HGB 5.9*  HCT 17.8*  MCV 90.7  PLT 83*   Cardiac Enzymes: No results for input(s): CKTOTAL, CKMB, CKMBINDEX, TROPONINI in the last 168 hours.  BNP (last 3 results) No results for input(s): PROBNP in the last 8760 hours. CBG: No results for input(s): GLUCAP in the last 168 hours.  Radiological Exams on Admission: Ct Chest W Contrast  01/17/2015   CLINICAL DATA:  Left-sided small cell lung cancer restaging  EXAM: CT CHEST, ABDOMEN, AND PELVIS WITH CONTRAST  TECHNIQUE: Multidetector CT imaging of the chest, abdomen and pelvis was performed following the standard protocol during bolus administration of intravenous contrast.  CONTRAST:  159m OMNIPAQUE IOHEXOL 300 MG/ML  SOLN  COMPARISON:  Multiple exams, including 11/16/2014  FINDINGS: CT CHEST FINDINGS  Mediastinum/Nodes: Distal esophageal wall thickening, measuring 9 mm in single wall thickness on image 49 series 2. Currently no pathologic thoracic adenopathy.  Extensive coronary artery atherosclerotic calcification. Proximal branch vessel atherosclerotic calcification noted.  Lungs/Pleura: New 0.9 by cm nodule medially in the right lower lobe, image 38 series 4.  Left lower lobe nodule on image 40 of series 4 measures 1.7 by 1.3 cm,  formerly 2.0 by 1.5 cm by my measurements. There is an indistinct bandlike density measuring 1.8 by 1.2 by 0.7 cm in the left lower lobe on image 32 series 4, new compared to the prior exam.  Musculoskeletal: Extensive metastatic disease involving the proximal humerus bilaterally, left medial clavicle, left scapular spine and glenoid, thoracic spine, and numerous ribs noted. Some of the previously acute fractured ribs now demonstrate periosteal reaction indicating healing response ; there are new acute fractures of the left anterior first rib and right lateral sixth rib which are thought to be pathologic. Extensive thoracic spine metastatic disease looks similar to the prior  exam with multiple pathologic fractures similar to prior, and better depicted on the thoracic spine MRI exams.  CT ABDOMEN PELVIS FINDINGS  Hepatobiliary: Scattered hepatic metastatic lesions are generally reduced in size compared to the prior exam. For example, a 2.1 by 1.6 cm metastatic lesion in the right hepatic lobe on image 64 of series 2 of the 11/16/2014 exam currently measures 1.1 by 0.9 cm. No definite new hepatic metastatic lesions. Multiple gallstones are noted, measuring up to 1.5 cm in diameter. No biliary dilatation.  Pancreas: The previous low-density lesion in the tip of the tail the pancreas is no longer visible. In general the pancreas is considerably smaller, query accelerated pancreatic atrophy.  Spleen: Unremarkable  Adrenals/Urinary Tract: Anterior wall thickening in the urinary bladder, unchanged.  Stomach/Bowel: Equivocal distal rectal wall thickening, not present previously.  Vascular/Lymphatic: Prior porta hepatis and peripancreatic adenopathy is considerably improved. An index 1.7 cm portacaval node currently measures 0.9 cm in short axis. Aortoiliac atherosclerotic vascular disease.  Reproductive: Unremarkable  Other: No supplemental non-categorized findings.  Musculoskeletal: Severe osseous metastatic disease  throughout the lumbar spine and bony pelvis, especially confluent in the right iliac bone and in the sacral ala. There is continued tumor in the anterior presacral space in danger of involving the sacral plexus, although the amount of presacral tumor appears slightly less than previous. No significant progressive collapse compared to prior. Lumbar spondylosis and degenerative disc disease cause foraminal impingement at L4-5 and L5-S1.  IMPRESSION: 1. The dominant left lower lobe lung nodule and the hepatic tumor is improved, and the left anterior presacral tumor extension from the sacrum also appears slightly less, suggesting generalized improvement. Also, the pancreatic tail lesion is no longer readily visible, and the pancreas appears somewhat more atrophic than on the prior exam. 2. The bony lesions are mostly stable, although there several new pathologic rib fractures as detailed above. The extent of osseous metastatic burden is high. New flat nodularity in both lower lobes could well be inflammatory rather than representing new neoplastic pulmonary nodules, but merits attention on followup studies. 3. Stable equivocal anterior E urinary bladder wall thickening, similar to prior. 4. Other findings include gist esophageal wall thickening (probably from esophagitis), atherosclerosis ; cholelithiasis ; equivocal distal rectal wall thickening ; and lower lumbar spondylosis and degenerative disc disease causing foraminal impingement at L4-5 and L5-S1.   Electronically Signed   By: Van Clines M.D.   On: 01/17/2015 16:20   Ct Abdomen Pelvis W Contrast  01/17/2015   CLINICAL DATA:  Left-sided small cell lung cancer restaging  EXAM: CT CHEST, ABDOMEN, AND PELVIS WITH CONTRAST  TECHNIQUE: Multidetector CT imaging of the chest, abdomen and pelvis was performed following the standard protocol during bolus administration of intravenous contrast.  CONTRAST:  144m OMNIPAQUE IOHEXOL 300 MG/ML  SOLN  COMPARISON:   Multiple exams, including 11/16/2014  FINDINGS: CT CHEST FINDINGS  Mediastinum/Nodes: Distal esophageal wall thickening, measuring 9 mm in single wall thickness on image 49 series 2. Currently no pathologic thoracic adenopathy.  Extensive coronary artery atherosclerotic calcification. Proximal branch vessel atherosclerotic calcification noted.  Lungs/Pleura: New 0.9 by cm nodule medially in the right lower lobe, image 38 series 4.  Left lower lobe nodule on image 40 of series 4 measures 1.7 by 1.3 cm, formerly 2.0 by 1.5 cm by my measurements. There is an indistinct bandlike density measuring 1.8 by 1.2 by 0.7 cm in the left lower lobe on image 32 series 4, new compared to the prior exam.  Musculoskeletal: Extensive metastatic disease involving the proximal humerus bilaterally, left medial clavicle, left scapular spine and glenoid, thoracic spine, and numerous ribs noted. Some of the previously acute fractured ribs now demonstrate periosteal reaction indicating healing response ; there are new acute fractures of the left anterior first rib and right lateral sixth rib which are thought to be pathologic. Extensive thoracic spine metastatic disease looks similar to the prior exam with multiple pathologic fractures similar to prior, and better depicted on the thoracic spine MRI exams.  CT ABDOMEN PELVIS FINDINGS  Hepatobiliary: Scattered hepatic metastatic lesions are generally reduced in size compared to the prior exam. For example, a 2.1 by 1.6 cm metastatic lesion in the right hepatic lobe on image 64 of series 2 of the 11/16/2014 exam currently measures 1.1 by 0.9 cm. No definite new hepatic metastatic lesions. Multiple gallstones are noted, measuring up to 1.5 cm in diameter. No biliary dilatation.  Pancreas: The previous low-density lesion in the tip of the tail the pancreas is no longer visible. In general the pancreas is considerably smaller, query accelerated pancreatic atrophy.  Spleen: Unremarkable   Adrenals/Urinary Tract: Anterior wall thickening in the urinary bladder, unchanged.  Stomach/Bowel: Equivocal distal rectal wall thickening, not present previously.  Vascular/Lymphatic: Prior porta hepatis and peripancreatic adenopathy is considerably improved. An index 1.7 cm portacaval node currently measures 0.9 cm in short axis. Aortoiliac atherosclerotic vascular disease.  Reproductive: Unremarkable  Other: No supplemental non-categorized findings.  Musculoskeletal: Severe osseous metastatic disease throughout the lumbar spine and bony pelvis, especially confluent in the right iliac bone and in the sacral ala. There is continued tumor in the anterior presacral space in danger of involving the sacral plexus, although the amount of presacral tumor appears slightly less than previous. No significant progressive collapse compared to prior. Lumbar spondylosis and degenerative disc disease cause foraminal impingement at L4-5 and L5-S1.  IMPRESSION: 1. The dominant left lower lobe lung nodule and the hepatic tumor is improved, and the left anterior presacral tumor extension from the sacrum also appears slightly less, suggesting generalized improvement. Also, the pancreatic tail lesion is no longer readily visible, and the pancreas appears somewhat more atrophic than on the prior exam. 2. The bony lesions are mostly stable, although there several new pathologic rib fractures as detailed above. The extent of osseous metastatic burden is high. New flat nodularity in both lower lobes could well be inflammatory rather than representing new neoplastic pulmonary nodules, but merits attention on followup studies. 3. Stable equivocal anterior E urinary bladder wall thickening, similar to prior. 4. Other findings include gist esophageal wall thickening (probably from esophagitis), atherosclerosis ; cholelithiasis ; equivocal distal rectal wall thickening ; and lower lumbar spondylosis and degenerative disc disease causing  foraminal impingement at L4-5 and L5-S1.   Electronically Signed   By: Van Clines M.D.   On: 01/17/2015 16:20    EKG: None seen in epic for today.  Assessment/Plan Principal Problem:   Pancytopenia due to antineoplastic chemotherapy - Hb: 5.9. Transfusing 2 units PRBC's. Follow post transfusion CBC's and aim for Hb > 8. Symptomatic Anemia. - WBC 1.8. ANC: 1. In absence of clinical features suggestive of infection, will hold of on growth stimulating factor. - Platelets: 83. No bleeding. Monitor.  Active Problems:   Hypertension  - Soft BP's. - Transfusion should help. - Does not look volume depleted. In fact appears slightly volume overloaded continue Lasix.    Hyperlipidemia - Continue statins.    Diabetes mellitus - Hold Metformin for  48 hours due to contrasted CT - Continue home dose of Lantus. Add SSI    Extensive stage small cell lung cancer - On 2nd line chemo per Oncology - CT Chest, abdomen and pelvis shows overall improvement    Cancer associated pain - Continue home pain regimen - has new pathological rib fractures    CN (constipation) - Bowel regimen    Protein-calorie malnutrition, severe - related to cancer    Nausea with vomiting - may be related to chemo- was worse immediately post chemo and has decreased - may be related to esophagitis: IV PPI and sucralfate. Diet as tolerated.    Hypomagnesemia - Replace IV and follow  Teeth pain - likely due to caries - OP Dental follow up when able    Code Status: Full  Family Communication: Discussed with sister at bedside  Disposition Plan: DC home possibly 01/18/2015   Time spent: 55 minutes.  Vernell Leep, MD, FACP, FHM. [facility] Pager [phone]  If 7PM-7AM, please contact night-coverage [email or web address] Password [facility] 01/17/2015, 5:32 PM

## 2015-01-17 NOTE — ED Notes (Signed)
Bed: NO67 Expected date:  Expected time:  Means of arrival:  Comments: Save for triage 1

## 2015-01-17 NOTE — ED Notes (Signed)
Patient states that he was having a CT today and was told that he needed a transfusion today and to come to the ED.

## 2015-01-17 NOTE — ED Provider Notes (Signed)
CSN: [id number]     Arrival date & time 01/17/15  1518 History   First MD Initiated Contact with Patient 01/17/15 1541     Chief Complaint  Patient presents with  . Abnormal Lab  . chemo patient      (Consider location/radiation/quality/duration/timing/severity/associated sxs/prior Treatment) HPI Comments: 54 year old male, oncology patient with liver and lung cancer, had labs drawn today.  Hgb noted to be 5.9.  Patient sent to ED to initiate transfusion.  Patient is a 54 y.o. male presenting with dizziness.  Dizziness Quality:  Lightheadedness Severity:  Moderate Onset quality:  Gradual Timing:  Intermittent Chronicity:  Recurrent Context: standing up   Relieved by:  Lying down Worsened by:  Standing up Associated symptoms: no blood in stool, no chest pain and no syncope   Risk factors: anemia     Past Medical History  Diagnosis Date  . Diabetes mellitus without complication   . Hypertension   . Smoking addiction   . Cancer     lung and liver  . Radiation 08/16/14-09/06/14    prophylatic cranial radiation 25 gray, chest, t-spine  35 gray  . Radiation 09/29/14-10/12/14    left hilar area 30 gray, right pelvis 30 gray, right lower rib cage area 30 gray  . 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 Lung cancer   . Liver cancer    Past Surgical History  Procedure Laterality Date  . Arm surgery     Family History  Problem Relation Age of Onset  . Pancreatic cancer Mother    History  Substance Use Topics  . Smoking status: Former Smoker -- 0.75 packs/day for 30 years    Types: Cigarettes    Quit date: 06/03/2014  . Smokeless tobacco: Never Used  . Alcohol Use: 18.0 oz/week    30 Cans of beer per week     Comment: 6 beers per day x 5 days per week    Review of Systems  Constitutional: Positive for fatigue.  Cardiovascular: Negative for chest pain and syncope.  Gastrointestinal: Negative for abdominal pain and blood in stool.  Neurological: Positive for dizziness.   All other systems reviewed and are negative.     Allergies  Review of patient's allergies indicates no known allergies.  Home Medications   Prior to Admission medications   Medication Sig Start Date End Date Taking? Authorizing Provider  albuterol (PROVENTIL HFA;VENTOLIN HFA) 108 (90 BASE) MCG/ACT inhaler Inhale 2 puffs into the lungs every 6 (six) hours as needed for wheezing. 08/09/12  Yes Leandrew Koyanagi, MD  Alum & Mag Hydroxide-Simeth (MAGIC MOUTHWASH W/LIDOCAINE) SOLN Take 5 mLs by mouth 4 (four) times daily as needed for mouth pain ([facility] mouthwash 1:1). Patient not taking: Reported on 01/06/2015 12/16/14   Susanne Borders, NP  amLODipine (NORVASC) 10 MG tablet Take 10 mg by mouth daily.    Historical Provider, MD  aspirin EC 81 MG tablet Take 81 mg by mouth as directed. Take 1 tablet all days except Wednesday    Historical Provider, MD  atorvastatin (LIPITOR) 40 MG tablet Take 40 mg by mouth daily.    Historical Provider, MD  Blood Pressure Monitoring (5 SERIES BP MONITOR) DEVI  06/08/14   Historical Provider, MD  calcium-vitamin D (OSCAL WITH D) 500-200 MG-UNIT per tablet Take 1 tablet by mouth daily with breakfast.     Historical Provider, MD  dexamethasone (DECADRON) 4 MG tablet Take 1 tablet (4 mg total) by mouth 2 (two) times daily. Patient  not taking: Reported on 01/17/2015 12/24/14   Curt Bears, MD  emollient (BIAFINE) cream Apply topically as needed. Patient taking differently: Apply 1 application topically as needed. For radiation itch. 08/16/14   Gery Pray, MD  esomeprazole (NEXIUM) 20 MG capsule Take 20 mg by mouth daily as needed (GERD).     Historical Provider, MD  fenofibrate 160 MG tablet Take 160 mg by mouth daily.    Historical Provider, MD  fluconazole (DIFLUCAN) 100 MG tablet Take 1 tablet (100 mg total) by mouth daily. Take 2 tablets on day 1 Patient not taking: Reported on 01/06/2015 12/13/14   Gery Pray, MD  Fluticasone-Salmeterol (ADVAIR) 250-50  MCG/DOSE AEPB Inhale 1 puff into the lungs every 12 (twelve) hours.    Historical Provider, MD  furosemide (LASIX) 20 MG tablet Take 2 tablets by mouth daily or as instructed 01/06/15   Adrena E Johnson, PA-C  Insulin Glargine (LANTUS) 100 UNIT/ML Solostar Pen Inject 15 Units into the skin daily. 11/29/14   Silver Huguenin Elgergawy, MD  Insulin Pen Needle (PEN NEEDLES 3/16") 31G X 5 MM MISC Use daily with your Lantus injection 11/29/14   Silver Huguenin Elgergawy, MD  MAGNESIUM-OXIDE 400 (241.3 MG) MG tablet TAKE 1 TABLET BY MOUTH TWICE DAILY 01/09/15   Carlton Adam, PA-C  metFORMIN (GLUCOPHAGE) 500 MG tablet Take 1,000 mg by mouth 2 (two) times daily with a meal.     Historical Provider, MD  metoprolol tartrate (LOPRESSOR) 25 MG tablet Take 25 mg by mouth daily.     Historical Provider, MD  Multiple Vitamins-Minerals (CENTRUM SILVER ADULT 50+ PO) Take 1 tablet by mouth daily.     Historical Provider, MD  ondansetron (ZOFRAN) 8 MG tablet Take 8 mg by mouth every 8 (eight) hours as needed for nausea or vomiting. Disp. 20 tablets. 1 refill.  Called in to [facility] per Dr. Sondra Come on 11/24/14.    Historical Provider, MD  ONE TOUCH ULTRA TEST test strip 1 each by Other route 3 (three) times daily.  04/11/14   Historical Provider, MD  Oxycodone HCl 10 MG TABS Take 1-2 tablets (10-20 mg total) by mouth every 4 (four) hours as needed. 01/12/15   Curt Bears, MD  OxyCODONE HCl ER 60 MG T12A Take 60 mg by mouth every 12 (twelve) hours. 12/23/14   Carlton Adam, PA-C  pantoprazole (PROTONIX) 40 MG tablet Take 40 mg by mouth daily.  05/19/14   Historical Provider, MD  polyethylene glycol (MIRALAX / GLYCOLAX) packet Take 17 g by mouth daily. 11/29/14   Silver Huguenin Elgergawy, MD  potassium chloride SA (K-DUR,KLOR-CON) 20 MEQ tablet Take 1 tablet (20 mEq total) by mouth daily. 01/06/15   Carlton Adam, PA-C  pyridOXINE (VITAMIN B-6) 100 MG tablet Take 100 mg by mouth daily.    Historical Provider, MD  senna (SENOKOT) 8.6 MG TABS  tablet Take 1 tablet (8.6 mg total) by mouth 2 (two) times daily. 11/29/14   Silver Huguenin Elgergawy, MD  sucralfate (CARAFATE) 1 GM/10ML suspension Take 10 mLs (1 g total) by mouth 4 (four) times daily -  with meals and at bedtime. 12/27/14   Thea Silversmith, MD  tamsulosin (FLOMAX) 0.4 MG CAPS capsule Take 1 capsule (0.4 mg total) by mouth daily. Patient not taking: Reported on 01/06/2015 11/29/14   Silver Huguenin Elgergawy, MD  vitamin C (ASCORBIC ACID) 500 MG tablet Take 500 mg by mouth daily.    Historical Provider, MD   BP 92/55 mmHg  Pulse 92  Temp(Src) 98.2 F (36.8 C) (Oral)  Resp 18  SpO2 100% Physical Exam  Constitutional: He is oriented to person, place, and time. He appears well-developed. He appears ill.  HENT:  Head: Atraumatic.  Eyes:  Conjunctiva pale.  Neck: Neck supple.  Cardiovascular: Normal rate and regular rhythm.   Pulmonary/Chest: Effort normal.  Abdominal: Soft. Bowel sounds are normal.  Musculoskeletal: He exhibits no edema or tenderness.  Neurological: He is alert and oriented to person, place, and time.  Skin: Skin is warm and dry. There is pallor.  Psychiatric: He has a normal mood and affect.    ED Course  Procedures (including critical care time) Labs Review Labs Reviewed - No data to display  Imaging Review No results found.   EKG Interpretation None      MDM   Final diagnoses:  None    Chemotherapy related anemia, currently symptomatic with hypotension and postural dizziness. Admitted to observation via hospitalist for blood transfusion.     Etta Quill, NP 01/18/15 4403  Ernestina Patches, MD 01/18/15 1046

## 2015-01-18 ENCOUNTER — Telehealth: Payer: Self-pay

## 2015-01-18 DIAGNOSIS — G893 Neoplasm related pain (acute) (chronic): Secondary | ICD-10-CM | POA: Diagnosis not present

## 2015-01-18 DIAGNOSIS — D61810 Antineoplastic chemotherapy induced pancytopenia: Secondary | ICD-10-CM | POA: Diagnosis not present

## 2015-01-18 DIAGNOSIS — E785 Hyperlipidemia, unspecified: Secondary | ICD-10-CM

## 2015-01-18 DIAGNOSIS — T451X5A Adverse effect of antineoplastic and immunosuppressive drugs, initial encounter: Secondary | ICD-10-CM

## 2015-01-18 DIAGNOSIS — I10 Essential (primary) hypertension: Secondary | ICD-10-CM | POA: Diagnosis not present

## 2015-01-18 LAB — BASIC METABOLIC PANEL
Anion gap: 7 (ref 5–15)
BUN: 6 mg/dL (ref 6–20)
CALCIUM: 7.6 mg/dL — AB (ref 8.9–10.3)
CHLORIDE: 98 mmol/L — AB (ref 101–111)
CO2: 28 mmol/L (ref 22–32)
Glucose, Bld: 130 mg/dL — ABNORMAL HIGH (ref 65–99)
POTASSIUM: 3.6 mmol/L (ref 3.5–5.1)
Sodium: 133 mmol/L — ABNORMAL LOW (ref 135–145)

## 2015-01-18 LAB — CBC WITH DIFFERENTIAL/PLATELET
Basophils Absolute: 0 10*3/uL (ref 0.0–0.1)
Basophils Relative: 1 % (ref 0–1)
Eosinophils Absolute: 0 10*3/uL (ref 0.0–0.7)
Eosinophils Relative: 1 % (ref 0–5)
HCT: 21.5 % — ABNORMAL LOW (ref 39.0–52.0)
Hemoglobin: 7.3 g/dL — ABNORMAL LOW (ref 13.0–17.0)
Lymphocytes Relative: 42 % (ref 12–46)
Lymphs Abs: 0.8 10*3/uL (ref 0.7–4.0)
MCH: 30.8 pg (ref 26.0–34.0)
MCHC: 34 g/dL (ref 30.0–36.0)
MCV: 90.7 fL (ref 78.0–100.0)
MONO ABS: 0.2 10*3/uL (ref 0.1–1.0)
MONOS PCT: 12 % (ref 3–12)
NEUTROS ABS: 0.9 10*3/uL — AB (ref 1.7–7.7)
NEUTROS PCT: 44 % (ref 43–77)
Platelets: 59 10*3/uL — ABNORMAL LOW (ref 150–400)
RBC: 2.37 MIL/uL — ABNORMAL LOW (ref 4.22–5.81)
RDW: 17.8 % — AB (ref 11.5–15.5)
WBC: 1.9 10*3/uL — ABNORMAL LOW (ref 4.0–10.5)

## 2015-01-18 LAB — TROPONIN I
Troponin I: <0.03 ng/mL (ref <0.031)
Troponin I: <0.03 ng/mL (ref <0.031)

## 2015-01-18 LAB — PREPARE RBC (CROSSMATCH)

## 2015-01-18 LAB — HEMOGLOBIN AND HEMATOCRIT, BLOOD
HEMATOCRIT: 25.8 % — AB (ref 39.0–52.0)
HEMOGLOBIN: 8.9 g/dL — AB (ref 13.0–17.0)

## 2015-01-18 LAB — MAGNESIUM: Magnesium: 1.9 mg/dL (ref 1.7–2.4)

## 2015-01-18 LAB — GLUCOSE, CAPILLARY: Glucose-Capillary: 115 mg/dL — ABNORMAL HIGH (ref 65–99)

## 2015-01-18 MED ORDER — MORPHINE SULFATE 2 MG/ML IJ SOLN
2.0000 mg | Freq: Once | INTRAMUSCULAR | Status: AC
  Administered 2015-01-18: 2 mg via INTRAVENOUS
  Filled 2015-01-18: qty 1

## 2015-01-18 MED ORDER — SODIUM CHLORIDE 0.9 % IV SOLN: Freq: Once | INTRAVENOUS | Status: DC

## 2015-01-18 MED ORDER — ZOLPIDEM TARTRATE 5 MG PO TABS
5.0000 mg | ORAL_TABLET | Freq: Once | ORAL | Status: AC
  Administered 2015-01-18: 5 mg via ORAL
  Filled 2015-01-18: qty 1

## 2015-01-18 MED ORDER — SODIUM CHLORIDE 0.9 % IV SOLN
Freq: Once | INTRAVENOUS | Status: AC
  Administered 2015-01-18: 05:00:00 via INTRAVENOUS

## 2015-01-18 NOTE — Discharge Instructions (Signed)
Anemia, Nonspecific Anemia is a condition in which the concentration of red blood cells or hemoglobin in the blood is below normal. Hemoglobin is a substance in red blood cells that carries oxygen to the tissues of the body. Anemia results in not enough oxygen reaching these tissues.  CAUSES  Common causes of anemia include:   Excessive bleeding. Bleeding may be internal or external. This includes excessive bleeding from periods (in women) or from the intestine.   Poor nutrition.   Chronic kidney, thyroid, and liver disease.  Bone marrow disorders that decrease red blood cell production.  Cancer and treatments for cancer.  HIV, AIDS, and their treatments.  Spleen problems that increase red blood cell destruction.  Blood disorders.  Excess destruction of red blood cells due to infection, medicines, and autoimmune disorders. SIGNS AND SYMPTOMS   Minor weakness.   Dizziness.   Headache.  Palpitations.   Shortness of breath, especially with exercise.   Paleness.  Cold sensitivity.  Indigestion.  Nausea.  Difficulty sleeping.  Difficulty concentrating. Symptoms may occur suddenly or they may develop slowly.  DIAGNOSIS  Additional blood tests are often needed. These help your health care provider determine the best treatment. Your health care provider will check your stool for blood and look for other causes of blood loss.  TREATMENT  Treatment varies depending on the cause of the anemia. Treatment can include:   Supplements of iron, vitamin Q33, or folic acid.   Hormone medicines.   A blood transfusion. This may be needed if blood loss is severe.   Hospitalization. This may be needed if there is significant continual blood loss.   Dietary changes.  Spleen removal. HOME CARE INSTRUCTIONS Keep all follow-up appointments. It often takes many weeks to correct anemia, and having your health care provider check on your condition and your response to  treatment is very important. SEEK IMMEDIATE MEDICAL CARE IF:   You develop extreme weakness, shortness of breath, or chest pain.   You become dizzy or have trouble concentrating.  You develop heavy vaginal bleeding.   You develop a rash.   You have bloody or black, tarry stools.   You faint.   You vomit up blood.   You vomit repeatedly.   You have abdominal pain.  You have a fever or persistent symptoms for more than 2-3 days.   You have a fever and your symptoms suddenly get worse.   You are dehydrated.  MAKE SURE YOU:  Understand these instructions.  Will watch your condition.  Will get help right away if you are not doing well or get worse. Document Released: 09/19/2004 Document Revised: 04/14/2013 Document Reviewed: 02/05/2013 [facility] Patient Information 2015 [place], [place]. This information is not intended to replace advice given to you by your health care provider. Make sure you discuss any questions you have with your health care provider. Thrombocytopenia Thrombocytopenia is a condition in which there is an abnormally small number of platelets in your blood. Platelets are also called thrombocytes. Platelets are needed for blood clotting. CAUSES Thrombocytopenia is caused by:   Decreased production of platelets. This can be caused by:  Aplastic anemia in which your bone marrow quits making blood cells.  Cancer in the bone marrow.  Use of certain medicines, including chemotherapy.  Infection in the bone marrow.  Heavy alcohol consumption.  Increased destruction of platelets. This can be caused by:  Certain immune diseases.  Use of certain drugs.  Certain blood clotting disorders.  Certain inherited disorders.  Certain bleeding disorders.  Pregnancy.  Having an enlarged spleen (hypersplenism). In hypersplenism, the spleen gathers up platelets from circulation. This means the platelets are not available to help with blood clotting. The  spleen can enlarge due to cirrhosis or other conditions. SYMPTOMS  The symptoms of thrombocytopenia are side effects of poor blood clotting. Some of these are:  Abnormal bleeding.  Nosebleeds.  Heavy menstrual periods.  Blood in the urine or stools.  Purpura. This is a purplish discoloration in the skin produced by small bleeding vessels near the surface of the skin.  Bruising.  A rash that may be petechial. This looks like pinpoint, purplish-red spots on the skin and mucous membranes. It is caused by bleeding from small blood vessels (capillaries). DIAGNOSIS  Your caregiver will make this diagnosis based on your exam and blood tests. Sometimes, a bone marrow study is done to look for the original cells (megakaryocytes) that make platelets. TREATMENT  Treatment depends on the cause of the condition.  Medicines may be given to help protect your platelets from being destroyed.  In some cases, a replacement (transfusion) of platelets may be required to stop or prevent bleeding.  Sometimes, the spleen must be surgically removed. HOME CARE INSTRUCTIONS   Check the skin and linings inside your mouth for bruising or bleeding as directed by your caregiver.  Check your sputum, urine, and stool for blood as directed by your caregiver.  Do not return to any activities that could cause bumps or bruises until your caregiver says it is okay.  Take extra care not to cut yourself when shaving or when using scissors, needles, knives, and other tools.  Take extra care not to burn yourself when ironing or cooking.  Ask your caregiver if it is okay for you to drink alcohol.  Only take over-the-counter or prescription medicines as directed by your caregiver.  Notify all your caregivers, including dentists and eye doctors, about your condition. SEEK IMMEDIATE MEDICAL CARE IF:   You develop active bleeding from anywhere in your body.  You develop unexplained bruising or bleeding.  You have  blood in your sputum, urine, or stool. MAKE SURE YOU:  Understand these instructions.  Will watch your condition.  Will get help right away if you are not doing well or get worse. Document Released: 08/12/2005 Document Revised: 11/04/2011 Document Reviewed: 06/14/2011 [facility] Patient Information 2015 [place], [place]. This information is not intended to replace advice given to you by your health care provider. Make sure you discuss any questions you have with your health care provider.

## 2015-01-18 NOTE — Evaluation (Signed)
Physical Therapy Evaluation Patient Details Name: Caven Perine MRN: [id number] DOB: 1960-11-30 Today's Date: 01/18/2015   History of Present Illness  Pt admitted through ED from [place] [facility] with Hgb 5.9.  Received 3 units blood with Hgb now 8.9  Clinical Impression  Pt admitted as above and currently mobilizing at MOD I/ SUP level.  Pt has assist as needed at home and all equipment in place.  Pt plans dc home this date with family assist.    Follow Up Recommendations No PT follow up    Equipment Recommendations       Recommendations for Other Services       Precautions / Restrictions Precautions Precautions: Fall Restrictions Weight Bearing Restrictions: No      Mobility  Bed Mobility Overal bed mobility: Modified Independent                Transfers Overall transfer level: Modified independent Equipment used: Rolling walker (2 wheeled)             General transfer comment: cues for use of UEs to self assist  Ambulation/Gait Ambulation/Gait assistance: Supervision Ambulation Distance (Feet): 300 Feet Assistive device: Rolling walker (2 wheeled) Gait Pattern/deviations: Step-through pattern;Decreased step length - right;Decreased step length - left;Shuffle;Trunk flexed     General Gait Details: cues for posture and position from RW  Stairs Stairs: Yes Stairs assistance: Min guard Stair Management: No rails Number of Stairs: 1 General stair comments: min cues for sequence`  Wheelchair Mobility    Modified Rankin (Stroke Patients Only)       Balance Overall balance assessment: Modified Independent                                           Pertinent Vitals/Pain Pain Assessment: Faces Faces Pain Scale: Hurts little more Pain Location: back pain Pain Descriptors / Indicators: Aching Pain Intervention(s): Limited activity within patient's tolerance;Monitored during session;Premedicated before session    Home Living  Family/patient expects to be discharged to:: Private residence Living Arrangements: Other relatives Available Help at Discharge: Family Type of Home: House Home Access: Stairs to enter   Technical brewer of Steps: 1 Home Layout: One level Home Equipment: Cane - single point;Walker - 2 wheels      Prior Function Level of Independence: Independent with assistive device(s);Needs assistance   Gait / Transfers Assistance Needed: using RW most recently  ADL's / Homemaking Assistance Needed: sister  assists with bathing,dressing        Hand Dominance        Extremity/Trunk Assessment   Upper Extremity Assessment: Generalized weakness           Lower Extremity Assessment: Generalized weakness         Communication   Communication: No difficulties  Cognition Arousal/Alertness: Awake/alert Behavior During Therapy: WFL for tasks assessed/performed Overall Cognitive Status: Within Functional Limits for tasks assessed                      General Comments      Exercises        Assessment/Plan    PT Assessment Patient needs continued PT services  PT Diagnosis Difficulty walking   PT Problem List Decreased activity tolerance;Decreased mobility;Decreased strength  PT Treatment Interventions DME instruction;Gait training;Stair training;Functional mobility training;Therapeutic activities;Patient/family education   PT Goals (Current goals can be found in the Care Plan section)  Acute Rehab PT Goals Patient Stated Goal: HOME PT Goal Formulation: All assessment and education complete, DC therapy    Frequency Min 3X/week   Barriers to discharge        Co-evaluation               End of Session   Activity Tolerance: Patient tolerated treatment well;Patient limited by fatigue Patient left: in bed;with call bell/phone within reach Nurse Communication: Mobility status    Functional Assessment Tool Used: clinical judgement Functional Limitation:  Mobility: Walking and moving around Mobility: Walking and Moving Around Current Status ([id number]): At least 1 percent but less than 20 percent impaired, limited or restricted Mobility: Walking and Moving Around Goal Status [phone]): At least 1 percent but less than 20 percent impaired, limited or restricted Mobility: Walking and Moving Around Discharge Status [phone]): At least 1 percent but less than 20 percent impaired, limited or restricted    Time: 6004-5997 PT Time Calculation (min) (ACUTE ONLY): 15 min   Charges:   PT Evaluation $Initial PT Evaluation Tier I: 1 Procedure     PT G Codes:   PT G-Codes **NOT FOR INPATIENT CLASS** Functional Assessment Tool Used: clinical judgement Functional Limitation: Mobility: Walking and moving around Mobility: Walking and Moving Around Current Status ([id number]): At least 1 percent but less than 20 percent impaired, limited or restricted Mobility: Walking and Moving Around Goal Status [phone]): At least 1 percent but less than 20 percent impaired, limited or restricted Mobility: Walking and Moving Around Discharge Status [phone]): At least 1 percent but less than 20 percent impaired, limited or restricted    Tena Linebaugh 01/18/2015, 12:29 PM

## 2015-01-18 NOTE — Telephone Encounter (Signed)
Yes to keep the appointment.

## 2015-01-18 NOTE — Telephone Encounter (Signed)
Pt called stating he is getting out of hospital today and he has a PA and infusion appt on Friday. He is questioning whether to keep this appt. He was instructed to "yes, keep the appt"

## 2015-01-18 NOTE — Progress Notes (Signed)
Earlier in shift, RN paged because pt's BP was low after 1st unit of blood. He was c/o chest pressure during that time. No radiation of pain or SOB. HR, SaO2 and RR normal. Instructed RN to get 12 lead EKG and TF 2nd unit. Ordered troponins. 12 lead EKG without any acute changes. Troponins x 2 <.03. Suspect chest pressure secondary to severe anemia. Hgb after 2 U 7.3. Target >8, so will order one more unit with H/H after.  Clance Boll, NP [facility]

## 2015-01-18 NOTE — Discharge Summary (Signed)
Physician Discharge Summary  Kerry Peck STM:[id number] DOB: [birth year] DOA: 01/17/2015  PCP: [name], MD  Admit date: 01/17/2015 Discharge date: 01/18/2015  Recommendations for Outpatient Follow-up:  1. Please hold aspirin until you are seen by either primary care physician or oncologist. Your appointment is scheduled for 01/20/2015 at 8:45 am  in cancer center. Please make sure that they repeat blood work to make sure platelet count, white blood cells and hemoglobin are stable. Is the cell count is stable then you may resume aspirin.  Discharge Diagnoses:  Principal Problem:   Pancytopenia due to antineoplastic chemotherapy Active Problems:   Hypertension   Hyperlipidemia   Diabetes mellitus   Small cell lung cancer   Cancer associated pain   Metastatic carcinoma to bone   CN (constipation)   Protein-calorie malnutrition, severe   Nausea with vomiting   Hypomagnesemia    Discharge Condition: stable   Diet recommendation: as tolerated   History of present illness:  54 y.o. 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 Patient was referred from cancer center for symptomatic anemia and need for blood transfusion. His blood work on the admission showed hemoglobin of 5.9. In addition, he reported significant weakness, nausea and nonbloody vomiting for about 10 days prior to the admission. Patient did not report fevers or chills prior to the admission.  During this hospital stay, patient received a total of 3 units of PRBC transfusion. He feels good this morning, 01/18/2015 and prefers to go home today. He ready has appointment scheduled in [place] on 01/20/2015 at  [facility] Course:   Principal Problem:   Pancytopenia due to antineoplastic chemotherapy - Pancytopenia secondary to recent chemotherapy - Patient has received a total of 3 units of PRBC transfusion during this hospital stay - Hemoglobin is 8.9 prior to discharge. His white blood cell count and platelets are 1.9 and 59 respectively. - As mentioned, he will follow up in [place]., Jan 20 2015 and have blood work rechecked at that time.  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 As tolerated.    Nausea with vomiting - Likely from recent chemotherapy. Now resolved. Tolerates regular diet.    Signed:  Leisa Lenz, MD  [facility] 01/18/2015, 9:44 AM  Pager #: [phone]  Time spent in minutes: more than 30 minutes   Discharge Exam: Filed Vitals:   01/18/15 0628  BP: 117/69  Pulse: 73  Temp: 98.7 F (37.1 C)  Resp: 16   Filed Vitals:   01/18/15 0403 01/18/15 0432 01/18/15 0628 01/18/15 0841  BP: 100/67 116/70 117/69   Pulse: 81 75 73   Temp: 99.2 F (37.3 C) 99.7 F (37.6 C) 98.7 F (37.1 C)   TempSrc: Oral Oral Oral   Resp: 16 16 16  Height:      Weight:      SpO2: 96% 100% 98% 97%    General: Pt is alert, follows commands appropriately, not in acute distress Cardiovascular: Regular rate and rhythm, S1/S2 +, no murmurs Respiratory: Clear to auscultation bilaterally, no wheezing, no crackles, no rhonchi Abdominal: Soft, non tender, non distended, bowel sounds +, no  guarding Extremities: no edema, no cyanosis, pulses palpable bilaterally DP and PT Neuro: Grossly nonfocal  Discharge Instructions  Discharge Instructions    Call MD for:  difficulty breathing, headache or visual disturbances    Complete by:  As directed      Call MD for:  persistant nausea and vomiting    Complete by:  As directed      Call MD for:  severe uncontrolled pain    Complete by:  As directed      Diet - low sodium heart healthy    Complete by:  As directed      Discharge instructions    Complete by:  As directed   1. Please hold aspirin until you are seen by either primary care physician or oncologist. Your appointment is scheduled for 01/20/2015 at 8:45 am  in cancer center. Please make sure that they repeat blood work to make sure platelet count, white blood cells and hemoglobin are stable. Is the cell count is stable then you may resume aspirin.     Increase activity slowly    Complete by:  As directed             Medication List    STOP taking these medications        aspirin EC 81 MG tablet      TAKE these medications        albuterol 108 (90 BASE) MCG/ACT inhaler  Commonly known as:  PROVENTIL HFA;VENTOLIN HFA  Inhale 2 puffs into the lungs every 6 (six) hours as needed for wheezing.     alum & mag hydroxide-simeth 200-200-20 MG/5ML suspension  Commonly known as:  MAALOX/MYLANTA  Take 30 mLs by mouth every 6 (six) hours as needed for indigestion or heartburn (indigestion).     amLODipine 10 MG tablet  Commonly known as:  NORVASC  Take 10 mg by mouth daily.     atorvastatin 40 MG tablet  Commonly known as:  LIPITOR  Take 40 mg by mouth daily.     benzocaine 10 % mucosal gel  Commonly known as:  ORAJEL  Use as directed 1 application in the mouth or throat 3 (three) times daily as needed for mouth pain (mouth pain).     bismuth subsalicylate 440 NU/27OZ suspension  Commonly known as:  PEPTO BISMOL  Take 30 mLs by mouth every 6 (six) hours as  needed for indigestion (indigestion).     calcium-vitamin D 500-200 MG-UNIT per tablet  Commonly known as:  OSCAL WITH D  Take 1 tablet by mouth daily with breakfast.     CENTRUM SILVER ADULT 50+ PO  Take 1 tablet by mouth daily.     emollient cream  Commonly known as:  BIAFINE  Apply topically as needed.     Fluticasone-Salmeterol 250-50 MCG/DOSE Aepb  Commonly known as:  ADVAIR  Inhale 2 puffs into the lungs 2 (two) times daily as needed (wheezing).     furosemide 20 MG tablet  Commonly known as:  LASIX  Take 2 tablets by mouth daily or as instructed     Insulin Glargine 100 UNIT/ML Solostar Pen  Commonly known as:  LANTUS  Inject 15 Units into the skin daily.     MAGNESIUM-OXIDE 400 (241.3 MG) MG tablet  Generic drug:  magnesium oxide  TAKE 1 TABLET BY MOUTH TWICE DAILY     metFORMIN 1000 MG tablet  Commonly known as:  GLUCOPHAGE  Take 1,000 mg by mouth 2 (two) times daily with a meal.     ondansetron 8 MG tablet  Commonly known as:  ZOFRAN  Take 8 mg by mouth every 8 (eight) hours as needed for nausea or vomiting (nause and vomiting). Disp. 20 tablets. 1 refill.  Called in to [facility] per Dr. Sondra Come on 11/24/14.     ONE TOUCH ULTRA TEST test strip  Generic drug:  glucose blood  1 each by Other route 3 (three) times daily.     OxyCODONE HCl ER 60 MG T12a  Take 60 mg by mouth every 12 (twelve) hours.     Oxycodone HCl 10 MG Tabs  Take 1-2 tablets (10-20 mg total) by mouth every 4 (four) hours as needed.     pantoprazole 40 MG tablet  Commonly known as:  PROTONIX  Take 40 mg by mouth daily.     Pen Needles 3/16" 31G X 5 MM Misc  Use daily with your Lantus injection     polyethylene glycol packet  Commonly known as:  MIRALAX / GLYCOLAX  Take 17 g by mouth daily.     potassium chloride SA 20 MEQ tablet  Commonly known as:  K-DUR,KLOR-CON  Take 1 tablet (20 mEq total) by mouth daily.     pyridOXINE 100 MG tablet  Commonly known as:  VITAMIN B-6  Take 100  mg by mouth daily.     senna 8.6 MG Tabs tablet  Commonly known as:  SENOKOT  Take 1 tablet (8.6 mg total) by mouth 2 (two) times daily.     sucralfate 1 GM/10ML suspension  Commonly known as:  CARAFATE  Take 10 mLs (1 g total) by mouth 4 (four) times daily -  with meals and at bedtime.     vitamin C 500 MG tablet  Commonly known as:  ASCORBIC ACID  Take 500 mg by mouth daily.           Follow-up Information    Follow up with [name], MD On 01/20/2015.   Specialty:  Oncology   Why:  at 8:45 am, Follow up appt after recent hospitalization   Contact information:   [place] [address] [phone]        The results of significant diagnostics from this hospitalization (including imaging, microbiology, ancillary and laboratory) are listed below for reference.    Significant Diagnostic Studies: Ct Chest W Contrast  01/17/2015   CLINICAL DATA:  Left-sided small cell lung cancer restaging  EXAM: CT CHEST, ABDOMEN, AND PELVIS WITH CONTRAST  TECHNIQUE: Multidetector CT imaging of the chest, abdomen and pelvis was performed following the standard protocol during bolus administration of intravenous contrast.  CONTRAST:  178m OMNIPAQUE IOHEXOL 300 MG/ML  SOLN  COMPARISON:  Multiple exams, including 11/16/2014  FINDINGS: CT CHEST FINDINGS  Mediastinum/Nodes: Distal esophageal wall thickening, measuring 9 mm in single wall thickness on image 49 series 2. Currently no pathologic thoracic adenopathy.  Extensive coronary artery atherosclerotic calcification. Proximal branch vessel atherosclerotic calcification noted.  Lungs/Pleura: New 0.9 by cm nodule medially in the right lower lobe, image 38 series 4.  Left lower lobe nodule on image 40 of series 4 measures  1.7 by 1.3 cm, formerly 2.0 by 1.5 cm by my measurements. There is an indistinct bandlike density measuring 1.8 by 1.2 by 0.7 cm in the left lower lobe on image 32 series 4, new compared to the prior exam.   Musculoskeletal: Extensive metastatic disease involving the proximal humerus bilaterally, left medial clavicle, left scapular spine and glenoid, thoracic spine, and numerous ribs noted. Some of the previously acute fractured ribs now demonstrate periosteal reaction indicating healing response ; there are new acute fractures of the left anterior first rib and right lateral sixth rib which are thought to be pathologic. Extensive thoracic spine metastatic disease looks similar to the prior exam with multiple pathologic fractures similar to prior, and better depicted on the thoracic spine MRI exams.  CT ABDOMEN PELVIS FINDINGS  Hepatobiliary: Scattered hepatic metastatic lesions are generally reduced in size compared to the prior exam. For example, a 2.1 by 1.6 cm metastatic lesion in the right hepatic lobe on image 64 of series 2 of the 11/16/2014 exam currently measures 1.1 by 0.9 cm. No definite new hepatic metastatic lesions. Multiple gallstones are noted, measuring up to 1.5 cm in diameter. No biliary dilatation.  Pancreas: The previous low-density lesion in the tip of the tail the pancreas is no longer visible. In general the pancreas is considerably smaller, query accelerated pancreatic atrophy.  Spleen: Unremarkable  Adrenals/Urinary Tract: Anterior wall thickening in the urinary bladder, unchanged.  Stomach/Bowel: Equivocal distal rectal wall thickening, not present previously.  Vascular/Lymphatic: Prior porta hepatis and peripancreatic adenopathy is considerably improved. An index 1.7 cm portacaval node currently measures 0.9 cm in short axis. Aortoiliac atherosclerotic vascular disease.  Reproductive: Unremarkable  Other: No supplemental non-categorized findings.  Musculoskeletal: Severe osseous metastatic disease throughout the lumbar spine and bony pelvis, especially confluent in the right iliac bone and in the sacral ala. There is continued tumor in the anterior presacral space in danger of involving the  sacral plexus, although the amount of presacral tumor appears slightly less than previous. No significant progressive collapse compared to prior. Lumbar spondylosis and degenerative disc disease cause foraminal impingement at L4-5 and L5-S1.  IMPRESSION: 1. The dominant left lower lobe lung nodule and the hepatic tumor is improved, and the left anterior presacral tumor extension from the sacrum also appears slightly less, suggesting generalized improvement. Also, the pancreatic tail lesion is no longer readily visible, and the pancreas appears somewhat more atrophic than on the prior exam. 2. The bony lesions are mostly stable, although there several new pathologic rib fractures as detailed above. The extent of osseous metastatic burden is high. New flat nodularity in both lower lobes could well be inflammatory rather than representing new neoplastic pulmonary nodules, but merits attention on followup studies. 3. Stable equivocal anterior E urinary bladder wall thickening, similar to prior. 4. Other findings include gist esophageal wall thickening (probably from esophagitis), atherosclerosis ; cholelithiasis ; equivocal distal rectal wall thickening ; and lower lumbar spondylosis and degenerative disc disease causing foraminal impingement at L4-5 and L5-S1.   Electronically Signed   By: Van Clines M.D.   On: 01/17/2015 16:20   Ct Abdomen Pelvis W Contrast  01/17/2015   CLINICAL DATA:  Left-sided small cell lung cancer restaging  EXAM: CT CHEST, ABDOMEN, AND PELVIS WITH CONTRAST  TECHNIQUE: Multidetector CT imaging of the chest, abdomen and pelvis was performed following the standard protocol during bolus administration of intravenous contrast.  CONTRAST:  129m OMNIPAQUE IOHEXOL 300 MG/ML  SOLN  COMPARISON:  Multiple exams, including  11/16/2014  FINDINGS: CT CHEST FINDINGS  Mediastinum/Nodes: Distal esophageal wall thickening, measuring 9 mm in single wall thickness on image 49 series 2. Currently no  pathologic thoracic adenopathy.  Extensive coronary artery atherosclerotic calcification. Proximal branch vessel atherosclerotic calcification noted.  Lungs/Pleura: New 0.9 by cm nodule medially in the right lower lobe, image 38 series 4.  Left lower lobe nodule on image 40 of series 4 measures 1.7 by 1.3 cm, formerly 2.0 by 1.5 cm by my measurements. There is an indistinct bandlike density measuring 1.8 by 1.2 by 0.7 cm in the left lower lobe on image 32 series 4, new compared to the prior exam.  Musculoskeletal: Extensive metastatic disease involving the proximal humerus bilaterally, left medial clavicle, left scapular spine and glenoid, thoracic spine, and numerous ribs noted. Some of the previously acute fractured ribs now demonstrate periosteal reaction indicating healing response ; there are new acute fractures of the left anterior first rib and right lateral sixth rib which are thought to be pathologic. Extensive thoracic spine metastatic disease looks similar to the prior exam with multiple pathologic fractures similar to prior, and better depicted on the thoracic spine MRI exams.  CT ABDOMEN PELVIS FINDINGS  Hepatobiliary: Scattered hepatic metastatic lesions are generally reduced in size compared to the prior exam. For example, a 2.1 by 1.6 cm metastatic lesion in the right hepatic lobe on image 64 of series 2 of the 11/16/2014 exam currently measures 1.1 by 0.9 cm. No definite new hepatic metastatic lesions. Multiple gallstones are noted, measuring up to 1.5 cm in diameter. No biliary dilatation.  Pancreas: The previous low-density lesion in the tip of the tail the pancreas is no longer visible. In general the pancreas is considerably smaller, query accelerated pancreatic atrophy.  Spleen: Unremarkable  Adrenals/Urinary Tract: Anterior wall thickening in the urinary bladder, unchanged.  Stomach/Bowel: Equivocal distal rectal wall thickening, not present previously.  Vascular/Lymphatic: Prior porta hepatis  and peripancreatic adenopathy is considerably improved. An index 1.7 cm portacaval node currently measures 0.9 cm in short axis. Aortoiliac atherosclerotic vascular disease.  Reproductive: Unremarkable  Other: No supplemental non-categorized findings.  Musculoskeletal: Severe osseous metastatic disease throughout the lumbar spine and bony pelvis, especially confluent in the right iliac bone and in the sacral ala. There is continued tumor in the anterior presacral space in danger of involving the sacral plexus, although the amount of presacral tumor appears slightly less than previous. No significant progressive collapse compared to prior. Lumbar spondylosis and degenerative disc disease cause foraminal impingement at L4-5 and L5-S1.  IMPRESSION: 1. The dominant left lower lobe lung nodule and the hepatic tumor is improved, and the left anterior presacral tumor extension from the sacrum also appears slightly less, suggesting generalized improvement. Also, the pancreatic tail lesion is no longer readily visible, and the pancreas appears somewhat more atrophic than on the prior exam. 2. The bony lesions are mostly stable, although there several new pathologic rib fractures as detailed above. The extent of osseous metastatic burden is high. New flat nodularity in both lower lobes could well be inflammatory rather than representing new neoplastic pulmonary nodules, but merits attention on followup studies. 3. Stable equivocal anterior E urinary bladder wall thickening, similar to prior. 4. Other findings include gist esophageal wall thickening (probably from esophagitis), atherosclerosis ; cholelithiasis ; equivocal distal rectal wall thickening ; and lower lumbar spondylosis and degenerative disc disease causing foraminal impingement at L4-5 and L5-S1.   Electronically Signed   By: Van Clines M.D.   On:  01/17/2015 16:20    Microbiology: No results found for this or any previous visit (from the past 240  hour(s)).   Labs: Basic Metabolic Panel:  Recent Labs Lab 01/17/15 1428 01/18/15 0243  NA 136 133*  K 3.6 3.6  CL  --  98*  CO2 26 28  GLUCOSE 114 130*  BUN 6.2* 6  CREATININE 0.6* <0.30*  CALCIUM 7.7* 7.6*  MG 1.4* 1.9   Liver Function Tests:  Recent Labs Lab 01/17/15 1428  AST 42*  ALT 33  ALKPHOS 146  BILITOT 1.10  PROT 5.8*  ALBUMIN 2.8*   No results for input(s): LIPASE, AMYLASE in the last 168 hours. No results for input(s): AMMONIA in the last 168 hours. CBC:  Recent Labs Lab 01/17/15 1425 01/18/15 0243 01/18/15 0833  WBC 1.8* 1.9*  --   NEUTROABS 1.0* 0.9*  --   HGB 5.9* 7.3* 8.9*  HCT 17.8* 21.5* 25.8*  MCV 90.7 90.7  --   PLT 83* 59*  --    Cardiac Enzymes:  Recent Labs Lab 01/17/15 2144 01/18/15 0243 01/18/15 0833  TROPONINI <0.03 <0.03 <0.03   BNP: BNP (last 3 results) No results for input(s): BNP in the last 8760 hours.  ProBNP (last 3 results) No results for input(s): PROBNP in the last 8760 hours.  CBG:  Recent Labs Lab 01/17/15 1757 01/17/15 2210 01/18/15 0731  GLUCAP 84 121* 115*

## 2015-01-18 NOTE — Progress Notes (Signed)
Kerry Peck   DOB:Mar 04, 1961   ER#:[id number]   PPJ#:[id number]  Patient Care Team: Curt Bears, MD as PCP - General (Oncology)  Subjective:54 year old male with a history of extensive stage small cell lung cancer, status post cycle 2 of chemotherapy with cisplatin and irinotecan, Last on 01/06/2015, admitted from the cancer center with symptomatic anemia. His hemoglobin at presentation was 5.9. He received 3 units of blood with some improvement in his counts today at 8.9.Marland KitchenHis platelets were 83, with a white count of 1.8, ANC of 1.0. Today, the patient is feeling better since admission. Denies fevers, chills, night sweats, vision changes, or mucositis. He has dyspnea, especially on exertion. Denies any chest pain or palpitations. Denies lower extremity swelling. He reported nonbloody vomiting 10 days prior to admission, which required bedrest, On presentation he had intermittent nausea as well without vomiting. His appetite is decreased.  Denies any dysuria. Denies abnormal skin rashes, or neuropathy. Denies any bleeding issues such as epistaxis, hematemesis, hematuria or hematochezia. Reports back and bone pain. CT of the chest with contrast on 01/17/2015, shows improvement in the dominant left lower lobe lung nodule, and the hepatic tumor, with the left anterior presacral tumor extension appearing slightly less suggesting generalized improvement. The pancreatic tail lesion is no longer regularly reasonable. The bony lesions are mostly stable, although there are several new pathologic rib fractures. New flattened nodularity in both lower lobes is present.CT of the abdomen and pelvis is essentially unremarkable for new findings. We were kindly informed of the patient's admission.  DIAGNOSIS: Extensive stage, Stage IV (T2a, N1, M1b) small cell lung cancer diagnosed in July of 2015.  PRIOR THERAPY:  1) Systemic chemotherapy with carboplatin for AUC of 5 on day 1 and etoposide at 120 mg/M2 on days  1, 2 and 3 with Neulasta support on day 4. Status post 6 cycles.  2) prophylactic cranial irradiation under the care of Dr. Sondra Come. 3) palliative radiotherapy to the T7-spine completed on 09/06/2014.  CURRENT THERAPY: Systemic chemotherapy with cisplatin 30 mg/m2 and irinotecan 65 mg /m2 given on days 1 and 8 every 3 weeks. Status post cycle #2 on 01/06/15. Day 1 C3 was planned for 5/27  Scheduled Meds: . [START ON 01/19/2015] aspirin EC  81 mg Oral Once per day on Sun Mon Tue Thu Fri Sat  . atorvastatin  40 mg Oral Daily  . calcium-vitamin D  1 tablet Oral Q breakfast  . furosemide  40 mg Oral Daily  . insulin aspart  0-9 Units Subcutaneous TID WC  . insulin glargine  15 Units Subcutaneous Daily  . mometasone-formoterol  2 puff Inhalation BID  . OxyCODONE  60 mg Oral Q12H  . pantoprazole (PROTONIX) IV  40 mg Intravenous Daily  . polyethylene glycol  17 g Oral Daily  . potassium chloride SA  20 mEq Oral Daily  . pyridOXINE  100 mg Oral Daily  . senna  1 tablet Oral BID   Continuous Infusions:  PRN Meds:.acetaminophen **OR** acetaminophen, albuterol, alum & mag hydroxide-simeth, benzocaine, hydrocerin, ondansetron **OR** ondansetron (ZOFRAN) IV, oxyCODONE, sucralfate  Objective:  Filed Vitals:   01/18/15 0628  BP: 117/69  Pulse: 73  Temp: 98.7 F (37.1 C)  Resp: 16      Intake/Output Summary (Last 24 hours) at 01/18/15 0937 Last data filed at 01/18/15 0924  Gross per 24 hour  Intake   1365 ml  Output    900 ml  Net    465 ml      GENERAL:alert,  no distress and comfortable, Mildly anxious EYES: normal, conjunctiva are pink and non-injected, sclera clear OROPHARYNX:no exudate, no erythema and lips, buccal mucosa, and tongue normal  NECK: supple, thyroid normal size, non-tender, without nodularity LYMPH:  no palpable lymphadenopathy in the cervical, axillary or inguinal LUNGS:Decreased breath sounds at the bases, occasional expiratory rhonchi, no wheezing. HEART: regular  rate & rhythm and no murmurs and no lower extremity edema ABDOMEN: soft, non-tender and normal bowel sounds Musculoskeletal:no cyanosis of digits and no clubbing  PSYCH: alert & oriented x 3 with fluent speech NEURO: no focal motor/sensory deficits    CBG (last 3)   Recent Labs  01/17/15 1757 01/17/15 2210 01/18/15 0731  GLUCAP 84 121* 115*     Labs:   Recent Labs Lab 01/17/15 1425 01/18/15 0243 01/18/15 0833  WBC 1.8* 1.9*  --   HGB 5.9* 7.3* 8.9*  HCT 17.8* 21.5* 25.8*  PLT 83* 59*  --   MCV 90.7 90.7  --   MCH 30.3 30.8  --   MCHC 33.4 34.0  --   RDW 20.4* 17.8*  --   LYMPHSABS 0.5* 0.8  --   MONOABS 0.2 0.2  --   EOSABS 0.0 0.0  --   BASOSABS 0.0 0.0  --      Chemistries:    Recent Labs Lab 01/17/15 1428 01/18/15 0243  NA 136 133*  K 3.6 3.6  CL  --  98*  CO2 26 28  GLUCOSE 114 130*  BUN 6.2* 6  CREATININE 0.6* <0.30*  CALCIUM 7.7* 7.6*  MG 1.4* 1.9  AST 42*  --   ALT 33  --   ALKPHOS 146  --   BILITOT 1.10  --     GFR CrCl cannot be calculated (Patient has no serum creatinine result on file.).  Liver Function Tests:  Recent Labs Lab 01/17/15 1428  AST 42*  ALT 33  ALKPHOS 146  BILITOT 1.10  PROT 5.8*  ALBUMIN 2.8*    Urine Studies     Component Value Date/Time   COLORURINE AMBER* 12/01/2014 1158   APPEARANCEUR CLOUDY* 12/01/2014 1158   LABSPEC 1.022 12/01/2014 1158   PHURINE 5.5 12/01/2014 1158   GLUCOSEU NEGATIVE 12/01/2014 1158   HGBUR MODERATE* 12/01/2014 1158   BILIRUBINUR NEGATIVE 12/01/2014 1158   KETONESUR NEGATIVE 12/01/2014 1158   PROTEINUR 30* 12/01/2014 1158   UROBILINOGEN 1.0 12/01/2014 1158   NITRITE NEGATIVE 12/01/2014 1158   LEUKOCYTESUR NEGATIVE 12/01/2014 1158    Coagulation profile No results for input(s): INR, PROTIME in the last 168 hours.  Cardiac Enzymes:  Recent Labs Lab 01/17/15 2144 01/18/15 0243 01/18/15 0833  TROPONINI <0.03 <0.03 <0.03   BNP: Invalid input(s):  POCBNP CBG:  Recent Labs Lab 01/17/15 1757 01/17/15 2210 01/18/15 0731  GLUCAP 84 121* 115*     Imaging Studies:  Ct Chest W Contrast  01/17/2015   CLINICAL DATA:  Left-sided small cell lung cancer restaging  EXAM: CT CHEST, ABDOMEN, AND PELVIS WITH CONTRAST  TECHNIQUE: Multidetector CT imaging of the chest, abdomen and pelvis was performed following the standard protocol during bolus administration of intravenous contrast.  CONTRAST:  152m OMNIPAQUE IOHEXOL 300 MG/ML  SOLN  COMPARISON:  Multiple exams, including 11/16/2014  FINDINGS: CT CHEST FINDINGS  Mediastinum/Nodes: Distal esophageal wall thickening, measuring 9 mm in single wall thickness on image 49 series 2. Currently no pathologic thoracic adenopathy.  Extensive coronary artery atherosclerotic calcification. Proximal branch vessel atherosclerotic calcification noted.  Lungs/Pleura: New 0.9 by cm nodule  medially in the right lower lobe, image 38 series 4.  Left lower lobe nodule on image 40 of series 4 measures 1.7 by 1.3 cm, formerly 2.0 by 1.5 cm by my measurements. There is an indistinct bandlike density measuring 1.8 by 1.2 by 0.7 cm in the left lower lobe on image 32 series 4, new compared to the prior exam.  Musculoskeletal: Extensive metastatic disease involving the proximal humerus bilaterally, left medial clavicle, left scapular spine and glenoid, thoracic spine, and numerous ribs noted. Some of the previously acute fractured ribs now demonstrate periosteal reaction indicating healing response ; there are new acute fractures of the left anterior first rib and right lateral sixth rib which are thought to be pathologic. Extensive thoracic spine metastatic disease looks similar to the prior exam with multiple pathologic fractures similar to prior, and better depicted on the thoracic spine MRI exams.  CT ABDOMEN PELVIS FINDINGS  Hepatobiliary: Scattered hepatic metastatic lesions are generally reduced in size compared to the prior exam.  For example, a 2.1 by 1.6 cm metastatic lesion in the right hepatic lobe on image 64 of series 2 of the 11/16/2014 exam currently measures 1.1 by 0.9 cm. No definite new hepatic metastatic lesions. Multiple gallstones are noted, measuring up to 1.5 cm in diameter. No biliary dilatation.  Pancreas: The previous low-density lesion in the tip of the tail the pancreas is no longer visible. In general the pancreas is considerably smaller, query accelerated pancreatic atrophy.  Spleen: Unremarkable  Adrenals/Urinary Tract: Anterior wall thickening in the urinary bladder, unchanged.  Stomach/Bowel: Equivocal distal rectal wall thickening, not present previously.  Vascular/Lymphatic: Prior porta hepatis and peripancreatic adenopathy is considerably improved. An index 1.7 cm portacaval node currently measures 0.9 cm in short axis. Aortoiliac atherosclerotic vascular disease.  Reproductive: Unremarkable  Other: No supplemental non-categorized findings.  Musculoskeletal: Severe osseous metastatic disease throughout the lumbar spine and bony pelvis, especially confluent in the right iliac bone and in the sacral ala. There is continued tumor in the anterior presacral space in danger of involving the sacral plexus, although the amount of presacral tumor appears slightly less than previous. No significant progressive collapse compared to prior. Lumbar spondylosis and degenerative disc disease cause foraminal impingement at L4-5 and L5-S1.  IMPRESSION: 1. The dominant left lower lobe lung nodule and the hepatic tumor is improved, and the left anterior presacral tumor extension from the sacrum also appears slightly less, suggesting generalized improvement. Also, the pancreatic tail lesion is no longer readily visible, and the pancreas appears somewhat more atrophic than on the prior exam. 2. The bony lesions are mostly stable, although there several new pathologic rib fractures as detailed above. The extent of osseous metastatic  burden is high. New flat nodularity in both lower lobes could well be inflammatory rather than representing new neoplastic pulmonary nodules, but merits attention on followup studies. 3. Stable equivocal anterior E urinary bladder wall thickening, similar to prior. 4. Other findings include gist esophageal wall thickening (probably from esophagitis), atherosclerosis ; cholelithiasis ; equivocal distal rectal wall thickening ; and lower lumbar spondylosis and degenerative disc disease causing foraminal impingement at L4-5 and L5-S1.   Electronically Signed   By: Van Clines M.D.   On: 01/17/2015 16:20   Ct Abdomen Pelvis W Contrast  01/17/2015   CLINICAL DATA:  Left-sided small cell lung cancer restaging  EXAM: CT CHEST, ABDOMEN, AND PELVIS WITH CONTRAST  TECHNIQUE: Multidetector CT imaging of the chest, abdomen and pelvis was performed following the standard protocol  during bolus administration of intravenous contrast.  CONTRAST:  177m OMNIPAQUE IOHEXOL 300 MG/ML  SOLN  COMPARISON:  Multiple exams, including 11/16/2014  FINDINGS: CT CHEST FINDINGS  Mediastinum/Nodes: Distal esophageal wall thickening, measuring 9 mm in single wall thickness on image 49 series 2. Currently no pathologic thoracic adenopathy.  Extensive coronary artery atherosclerotic calcification. Proximal branch vessel atherosclerotic calcification noted.  Lungs/Pleura: New 0.9 by cm nodule medially in the right lower lobe, image 38 series 4.  Left lower lobe nodule on image 40 of series 4 measures 1.7 by 1.3 cm, formerly 2.0 by 1.5 cm by my measurements. There is an indistinct bandlike density measuring 1.8 by 1.2 by 0.7 cm in the left lower lobe on image 32 series 4, new compared to the prior exam.  Musculoskeletal: Extensive metastatic disease involving the proximal humerus bilaterally, left medial clavicle, left scapular spine and glenoid, thoracic spine, and numerous ribs noted. Some of the previously acute fractured ribs now  demonstrate periosteal reaction indicating healing response ; there are new acute fractures of the left anterior first rib and right lateral sixth rib which are thought to be pathologic. Extensive thoracic spine metastatic disease looks similar to the prior exam with multiple pathologic fractures similar to prior, and better depicted on the thoracic spine MRI exams.  CT ABDOMEN PELVIS FINDINGS  Hepatobiliary: Scattered hepatic metastatic lesions are generally reduced in size compared to the prior exam. For example, a 2.1 by 1.6 cm metastatic lesion in the right hepatic lobe on image 64 of series 2 of the 11/16/2014 exam currently measures 1.1 by 0.9 cm. No definite new hepatic metastatic lesions. Multiple gallstones are noted, measuring up to 1.5 cm in diameter. No biliary dilatation.  Pancreas: The previous low-density lesion in the tip of the tail the pancreas is no longer visible. In general the pancreas is considerably smaller, query accelerated pancreatic atrophy.  Spleen: Unremarkable  Adrenals/Urinary Tract: Anterior wall thickening in the urinary bladder, unchanged.  Stomach/Bowel: Equivocal distal rectal wall thickening, not present previously.  Vascular/Lymphatic: Prior porta hepatis and peripancreatic adenopathy is considerably improved. An index 1.7 cm portacaval node currently measures 0.9 cm in short axis. Aortoiliac atherosclerotic vascular disease.  Reproductive: Unremarkable  Other: No supplemental non-categorized findings.  Musculoskeletal: Severe osseous metastatic disease throughout the lumbar spine and bony pelvis, especially confluent in the right iliac bone and in the sacral ala. There is continued tumor in the anterior presacral space in danger of involving the sacral plexus, although the amount of presacral tumor appears slightly less than previous. No significant progressive collapse compared to prior. Lumbar spondylosis and degenerative disc disease cause foraminal impingement at L4-5 and  L5-S1.  IMPRESSION: 1. The dominant left lower lobe lung nodule and the hepatic tumor is improved, and the left anterior presacral tumor extension from the sacrum also appears slightly less, suggesting generalized improvement. Also, the pancreatic tail lesion is no longer readily visible, and the pancreas appears somewhat more atrophic than on the prior exam. 2. The bony lesions are mostly stable, although there several new pathologic rib fractures as detailed above. The extent of osseous metastatic burden is high. New flat nodularity in both lower lobes could well be inflammatory rather than representing new neoplastic pulmonary nodules, but merits attention on followup studies. 3. Stable equivocal anterior E urinary bladder wall thickening, similar to prior. 4. Other findings include gist esophageal wall thickening (probably from esophagitis), atherosclerosis ; cholelithiasis ; equivocal distal rectal wall thickening ; and lower lumbar spondylosis and degenerative disc  disease causing foraminal impingement at L4-5 and L5-S1.   Electronically Signed   By: Van Clines M.D.   On: 01/17/2015 16:20    Assessment/Plan: 54 y.o.   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 No transfusion is indicated at this time Monitor counts closely Transfuse blood to maintain a Hb of 8 g or if the patient is acutely bleeding  Thrombocytopenia In view of receiving chemotherapy, malignancy, IV fluids. Monitor counts closely Currently, they are at 59,000 from 83,000 admission. 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 He has a follow-up appointment with me in a few days for reevaluation.

## 2015-01-19 ENCOUNTER — Telehealth: Payer: Self-pay | Admitting: Oncology

## 2015-01-19 ENCOUNTER — Telehealth: Payer: Self-pay | Admitting: *Deleted

## 2015-01-19 NOTE — Telephone Encounter (Signed)
Yordin called asking for a refill on oxycodone.  Advised him that Dr. Julien Nordmann last filled this medication for him.  Ein replied that he would like to talk to Dr. Worthy Flank nurse about this.  He was  transferred him to Dr. Worthy Flank nurse's voicemail.

## 2015-01-19 NOTE — Telephone Encounter (Signed)
TC from patient stating that his current pain meds are not as effective as they had been. He sees [facility] [place], [place] and would like to discuss other options. He is currently on oxycodotin 60 mg, 1 every 12 hours and oxycodone 10 mg tabs 1-2 every 4 hours as needed for breakthrough pain.

## 2015-01-20 ENCOUNTER — Telehealth: Payer: Self-pay | Admitting: Internal Medicine

## 2015-01-20 ENCOUNTER — Ambulatory Visit (HOSPITAL_BASED_OUTPATIENT_CLINIC_OR_DEPARTMENT_OTHER): Payer: Federal, State, Local not specified - PPO | Admitting: Physician Assistant

## 2015-01-20 ENCOUNTER — Encounter: Payer: Self-pay | Admitting: Physician Assistant

## 2015-01-20 ENCOUNTER — Other Ambulatory Visit: Payer: Self-pay

## 2015-01-20 ENCOUNTER — Ambulatory Visit: Payer: Federal, State, Local not specified - PPO

## 2015-01-20 ENCOUNTER — Telehealth: Payer: Self-pay | Admitting: Physician Assistant

## 2015-01-20 ENCOUNTER — Ambulatory Visit (HOSPITAL_BASED_OUTPATIENT_CLINIC_OR_DEPARTMENT_OTHER): Payer: Federal, State, Local not specified - PPO

## 2015-01-20 VITALS — BP 128/99 | HR 117 | Temp 98.8°F | Resp 2 | Ht 72.0 in | Wt 182.8 lb

## 2015-01-20 DIAGNOSIS — T451X5A Adverse effect of antineoplastic and immunosuppressive drugs, initial encounter: Secondary | ICD-10-CM

## 2015-01-20 DIAGNOSIS — R197 Diarrhea, unspecified: Secondary | ICD-10-CM

## 2015-01-20 DIAGNOSIS — C7B8 Other secondary neuroendocrine tumors: Secondary | ICD-10-CM

## 2015-01-20 DIAGNOSIS — C3490 Malignant neoplasm of unspecified part of unspecified bronchus or lung: Secondary | ICD-10-CM

## 2015-01-20 DIAGNOSIS — C7951 Secondary malignant neoplasm of bone: Secondary | ICD-10-CM

## 2015-01-20 DIAGNOSIS — D6481 Anemia due to antineoplastic chemotherapy: Secondary | ICD-10-CM

## 2015-01-20 DIAGNOSIS — R1310 Dysphagia, unspecified: Secondary | ICD-10-CM

## 2015-01-20 DIAGNOSIS — C7A1 Malignant poorly differentiated neuroendocrine tumors: Secondary | ICD-10-CM

## 2015-01-20 LAB — CBC WITH DIFFERENTIAL/PLATELET
BASO%: 0.7 % (ref 0.0–2.0)
Basophils Absolute: 0 10*3/uL (ref 0.0–0.1)
EOS ABS: 0 10*3/uL (ref 0.0–0.5)
EOS%: 0.9 % (ref 0.0–7.0)
HCT: 26.9 % — ABNORMAL LOW (ref 38.4–49.9)
HGB: 9 g/dL — ABNORMAL LOW (ref 13.0–17.1)
LYMPH#: 0.8 10*3/uL — AB (ref 0.9–3.3)
LYMPH%: 31.7 % (ref 14.0–49.0)
MCH: 29.9 pg (ref 27.2–33.4)
MCHC: 33.4 g/dL (ref 32.0–36.0)
MCV: 89.3 fL (ref 79.3–98.0)
MONO#: 0.3 10*3/uL (ref 0.1–0.9)
MONO%: 10.3 % (ref 0.0–14.0)
NEUT%: 56.4 % (ref 39.0–75.0)
NEUTROS ABS: 1.4 10*3/uL — AB (ref 1.5–6.5)
Platelets: 74 10*3/uL — ABNORMAL LOW (ref 140–400)
RBC: 3.01 10*6/uL — ABNORMAL LOW (ref 4.20–5.82)
RDW: 17.7 % — ABNORMAL HIGH (ref 11.0–14.6)
WBC: 2.5 10*3/uL — AB (ref 4.0–10.3)

## 2015-01-20 MED ORDER — CEPHALEXIN 500 MG PO CAPS: 500.0000 mg | ORAL_CAPSULE | Freq: Three times a day (TID) | ORAL | Status: DC

## 2015-01-20 MED ORDER — OXYCODONE HCL 10 MG PO TABS: 10.0000 mg | ORAL_TABLET | ORAL | Status: DC | PRN

## 2015-01-20 MED ORDER — OXYCODONE HCL ER 60 MG PO T12A
60.0000 mg | EXTENDED_RELEASE_TABLET | Freq: Two times a day (BID) | ORAL | Status: DC
Start: 2015-01-20 — End: 2015-02-10

## 2015-01-20 NOTE — Telephone Encounter (Signed)
Gave patient avs report and appointments for June and July. Per AJ ok for patient to see her again 6/17 and to schedule appointments on Fridays. Per AJ added follow with MM 3 weeks after 6/17 follow up with her. Due to patient is coming in on Fridays he will have lab/MM 7/7 and tx 7/8. Patient is aware and ok with this scheduling. Added next cycle of tx for 6/17 and 6/24 and 7/8 and 7/15 as tx may be needed and MM not in office to add to care plan.

## 2015-01-20 NOTE — Telephone Encounter (Signed)
Per 05/27 POF added lab sent back to labs for CBC... KJ

## 2015-01-20 NOTE — Progress Notes (Signed)
tx cancelled d/t platelet count per Awilda Metro PA

## 2015-01-20 NOTE — Progress Notes (Addendum)
[place] [phone]   [phone]  OFFICE PROGRESS NOTE  Kerry Peck., MD [place] [address]  DIAGNOSIS: Extensive stage, Stage IV (T2a, N1, M1b) small cell lung cancer diagnosed in July of 2015.  PRIOR THERAPY:  1) Systemic chemotherapy with carboplatin for AUC of 5 on day 1 and etoposide at 120 mg/M2 on days 1, 2 and 3 with Neulasta support on day 4. Status post 6 cycles.  2) prophylactic cranial irradiation under the care of Dr. Sondra Come. 3) palliative radiotherapy to the T7-spine completed on 09/06/2014.  CURRENT THERAPY: Systemic chemotherapy with cisplatin 30 mg/m2 and irinotecan 65 mg /m2 given on days 1 and 8 every 3 weeks. Status post 2 cycles   INTERVAL HISTORY: Kerry Peck 54 y.o. male returns to the clinic today for follow up visit. His recent restaging CT scan showed evidence for disease progression. He is currently being treated with systemic chemotherapy with cisplatin and irinotecan. He is status post 2 cycles. He completed his palliative radiation to the upper and lower thoracic spine on 12/13/2014. His back pain has improved but is still present He presents in a wheelchair. He was noted to have chemotherapy induced anemia earlier this week and is status post packed red cell transfusion. He denied having any significant fever or chills, no nausea or vomiting. He denied having any significant weight loss or night sweats. The patient has no significant chest pain but continues to have shortness of breath with exertion with no cough or hemoptysis. He reports having decreased lower extremity edema. He complains of redness and tenderness at the site where an IV was started in the emergency room for his blood transfusion. He denied fever or chills.  MEDICAL HISTORY: Past Medical History  Diagnosis Date  . Diabetes mellitus without complication   . Hypertension   . Smoking addiction   . Cancer     lung and  liver  . Radiation 08/16/14-09/06/14    prophylatic cranial radiation 25 gray, chest, t-spine  35 gray  . Radiation 09/29/14-10/12/14    left hilar area 30 gray, right pelvis 30 gray, right lower rib cage area 30 gray  . 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 Lung cancer   . Liver cancer     ALLERGIES:  has No Known Allergies.  MEDICATIONS:  Current Outpatient Prescriptions  Medication Sig Dispense Refill  . albuterol (PROVENTIL HFA;VENTOLIN HFA) 108 (90 BASE) MCG/ACT inhaler Inhale 2 puffs into the lungs every 6 (six) hours as needed for wheezing. 1 Inhaler 0  . alum & mag hydroxide-simeth (MAALOX/MYLANTA) 200-200-20 MG/5ML suspension Take 30 mLs by mouth every 6 (six) hours as needed for indigestion or heartburn (indigestion).    Marland Kitchen amLODipine (NORVASC) 10 MG tablet Take 10 mg by mouth daily.     Marland Kitchen atorvastatin (LIPITOR) 40 MG tablet Take 40 mg by mouth daily.    . benzocaine (ORAJEL) 10 % mucosal gel Use as directed 1 application in the mouth or throat 3 (three) times daily as needed for mouth pain (mouth pain).    . bismuth subsalicylate (PEPTO BISMOL) 262 MG/15ML suspension Take 30 mLs by mouth every 6 (six) hours as needed for indigestion (indigestion).    . calcium-vitamin D (OSCAL WITH D) 500-200 MG-UNIT per tablet Take 1 tablet by mouth daily with breakfast.     . emollient (BIAFINE) cream Apply topically as needed. (Patient taking differently: Apply 1 application topically as needed. For radiation  itch.) 454 g 0  . Fluticasone-Salmeterol (ADVAIR) 250-50 MCG/DOSE AEPB Inhale 2 puffs into the lungs 2 (two) times daily as needed (wheezing).     . furosemide (LASIX) 20 MG tablet Take 2 tablets by mouth daily or as instructed 30 tablet 0  . Insulin Glargine (LANTUS) 100 UNIT/ML Solostar Pen Inject 15 Units into the skin daily. 15 mL 0  . Insulin Pen Needle (PEN NEEDLES 3/16") 31G X 5 MM MISC Use daily with your Lantus injection 90 each 0  . MAGNESIUM-OXIDE 400 (241.3 MG) MG tablet TAKE  1 TABLET BY MOUTH TWICE DAILY (Patient taking differently: TAKE 1 TABLET BY MOUTH DAILY) 60 tablet 2  . metFORMIN (GLUCOPHAGE) 1000 MG tablet Take 1,000 mg by mouth 2 (two) times daily with a meal.    . Multiple Vitamins-Minerals (CENTRUM SILVER ADULT 50+ PO) Take 1 tablet by mouth daily.     . ondansetron (ZOFRAN) 8 MG tablet Take 8 mg by mouth every 8 (eight) hours as needed for nausea or vomiting (nause and vomiting). Disp. 20 tablets. 1 refill.  Called in to [facility] per Dr. Sondra Come on 11/24/14.    Marland Kitchen ONE TOUCH ULTRA TEST test strip 1 each by Other route 3 (three) times daily.     . Oxycodone HCl 10 MG TABS Take 1-2 tablets (10-20 mg total) by mouth every 4 (four) hours as needed. 60 tablet 0  . OxyCODONE HCl ER 60 MG T12A Take 60 mg by mouth every 12 (twelve) hours. 60 each 0  . pantoprazole (PROTONIX) 40 MG tablet Take 40 mg by mouth daily.     . polyethylene glycol (MIRALAX / GLYCOLAX) packet Take 17 g by mouth daily. 14 each 0  . potassium chloride SA (K-DUR,KLOR-CON) 20 MEQ tablet Take 1 tablet (20 mEq total) by mouth daily. 30 tablet 0  . pyridOXINE (VITAMIN B-6) 100 MG tablet Take 100 mg by mouth daily.    Marland Kitchen senna (SENOKOT) 8.6 MG TABS tablet Take 1 tablet (8.6 mg total) by mouth 2 (two) times daily. 120 each 0  . sucralfate (CARAFATE) 1 GM/10ML suspension Take 10 mLs (1 g total) by mouth 4 (four) times daily -  with meals and at bedtime. (Patient taking differently: Take 1 g by mouth 3 (three) times daily as needed (throat pain). ) 420 mL 1  . vitamin C (ASCORBIC ACID) 500 MG tablet Take 500 mg by mouth daily.    . cephALEXin (KEFLEX) 500 MG capsule Take 1 capsule (500 mg total) by mouth 3 (three) times daily. 30 capsule 0   No current facility-administered medications for this visit.   Facility-Administered Medications Ordered in Other Visits  Medication Dose Route Frequency Provider Last Rate Last Dose  . morphine 4 MG/ML injection 2 mg  2 mg Intravenous Q30 min PRN Susanne Borders,  NP   2 mg at 11/23/14 1345    REVIEW OF SYSTEMS:  Constitutional: positive for anorexia, fatigue and weight loss Eyes: negative Ears, nose, mouth, throat, and face: negative Respiratory: positive for dyspnea on exertion Cardiovascular: negative Gastrointestinal: positive for diarrhea Genitourinary:negative Integument/breast: redness and tenderness at the site of a recent IV Hematologic/lymphatic: negative Musculoskeletal:positive for back pain and bone pain Neurological: negative Behavioral/Psych: positive for sleep disturbance Endocrine: negative Allergic/Immunologic: negative   PHYSICAL EXAMINATION: General appearance: alert, cooperative and no distress Head: Normocephalic, without obvious abnormality, atraumatic Neck: no adenopathy, no JVD, supple, symmetrical, trachea midline and thyroid not enlarged, symmetric, no tenderness/mass/nodules Lymph nodes: Cervical, supraclavicular, and axillary nodes  normal. Resp: clear to auscultation bilaterally Back: symmetric, no curvature. ROM normal. No CVA tenderness. Cardio: regular rate and rhythm, S1, S2 normal, no murmur, click, rub or gallop GI: soft, non-tender; bowel sounds normal; no masses,  no organomegaly Extremities: edema 1-2+ pitting edema bilateral lower extremities diminishing to trace below the knees Neurologic: Grossly normal Skin: erythema, warmth and small pustule in the left antecubital fossa at site of previous IV  ECOG PERFORMANCE STATUS: 2 - Symptomatic, <50% confined to bed  Blood pressure 128/99, pulse 117, temperature 98.8 F (37.1 C), temperature source Oral, resp. rate 2, height 6' (1.829 m), weight 182 lb 12.8 oz (82.918 kg), SpO2 100 %.  LABORATORY DATA: Lab Results  Component Value Date   WBC 2.5* 01/20/2015   HGB 9.0* 01/20/2015   HCT 26.9* 01/20/2015   MCV 89.3 01/20/2015   PLT 74* 01/20/2015      Chemistry      Component Value Date/Time   NA 133* 01/18/2015 0243   NA 136 01/17/2015 1428   K  3.6 01/18/2015 0243   K 3.6 01/17/2015 1428   CL 98* 01/18/2015 0243   CO2 28 01/18/2015 0243   CO2 26 01/17/2015 1428   BUN 6 01/18/2015 0243   BUN 6.2* 01/17/2015 1428   CREATININE <0.30* 01/18/2015 0243   CREATININE 0.6* 01/17/2015 1428      Component Value Date/Time   CALCIUM 7.6* 01/18/2015 0243   CALCIUM 7.7* 01/17/2015 1428   ALKPHOS 146 01/17/2015 1428   ALKPHOS 151* 12/09/2014 1027   AST 42* 01/17/2015 1428   AST 54* 12/09/2014 1027   ALT 33 01/17/2015 1428   ALT 46 12/09/2014 1027   BILITOT 1.10 01/17/2015 1428   BILITOT 0.3 12/09/2014 1027       RADIOGRAPHIC STUDIES: Ct Chest W Contrast  01/17/2015   CLINICAL DATA:  Left-sided small cell lung cancer restaging  EXAM: CT CHEST, ABDOMEN, AND PELVIS WITH CONTRAST  TECHNIQUE: Multidetector CT imaging of the chest, abdomen and pelvis was performed following the standard protocol during bolus administration of intravenous contrast.  CONTRAST:  111m OMNIPAQUE IOHEXOL 300 MG/ML  SOLN  COMPARISON:  Multiple exams, including 11/16/2014  FINDINGS: CT CHEST FINDINGS  Mediastinum/Nodes: Distal esophageal wall thickening, measuring 9 mm in single wall thickness on image 49 series 2. Currently no pathologic thoracic adenopathy.  Extensive coronary artery atherosclerotic calcification. Proximal branch vessel atherosclerotic calcification noted.  Lungs/Pleura: New 0.9 by cm nodule medially in the right lower lobe, image 38 series 4.  Left lower lobe nodule on image 40 of series 4 measures 1.7 by 1.3 cm, formerly 2.0 by 1.5 cm by my measurements. There is an indistinct bandlike density measuring 1.8 by 1.2 by 0.7 cm in the left lower lobe on image 32 series 4, new compared to the prior exam.  Musculoskeletal: Extensive metastatic disease involving the proximal humerus bilaterally, left medial clavicle, left scapular spine and glenoid, thoracic spine, and numerous ribs noted. Some of the previously acute fractured ribs now demonstrate periosteal  reaction indicating healing response ; there are new acute fractures of the left anterior first rib and right lateral sixth rib which are thought to be pathologic. Extensive thoracic spine metastatic disease looks similar to the prior exam with multiple pathologic fractures similar to prior, and better depicted on the thoracic spine MRI exams.  CT ABDOMEN PELVIS FINDINGS  Hepatobiliary: Scattered hepatic metastatic lesions are generally reduced in size compared to the prior exam. For example, a 2.1 by 1.6 cm metastatic  lesion in the right hepatic lobe on image 64 of series 2 of the 11/16/2014 exam currently measures 1.1 by 0.9 cm. No definite new hepatic metastatic lesions. Multiple gallstones are noted, measuring up to 1.5 cm in diameter. No biliary dilatation.  Pancreas: The previous low-density lesion in the tip of the tail the pancreas is no longer visible. In general the pancreas is considerably smaller, query accelerated pancreatic atrophy.  Spleen: Unremarkable  Adrenals/Urinary Tract: Anterior wall thickening in the urinary bladder, unchanged.  Stomach/Bowel: Equivocal distal rectal wall thickening, not present previously.  Vascular/Lymphatic: Prior porta hepatis and peripancreatic adenopathy is considerably improved. An index 1.7 cm portacaval node currently measures 0.9 cm in short axis. Aortoiliac atherosclerotic vascular disease.  Reproductive: Unremarkable  Other: No supplemental non-categorized findings.  Musculoskeletal: Severe osseous metastatic disease throughout the lumbar spine and bony pelvis, especially confluent in the right iliac bone and in the sacral ala. There is continued tumor in the anterior presacral space in danger of involving the sacral plexus, although the amount of presacral tumor appears slightly less than previous. No significant progressive collapse compared to prior. Lumbar spondylosis and degenerative disc disease cause foraminal impingement at L4-5 and L5-S1.  IMPRESSION: 1.  The dominant left lower lobe lung nodule and the hepatic tumor is improved, and the left anterior presacral tumor extension from the sacrum also appears slightly less, suggesting generalized improvement. Also, the pancreatic tail lesion is no longer readily visible, and the pancreas appears somewhat more atrophic than on the prior exam. 2. The bony lesions are mostly stable, although there several new pathologic rib fractures as detailed above. The extent of osseous metastatic burden is high. New flat nodularity in both lower lobes could well be inflammatory rather than representing new neoplastic pulmonary nodules, but merits attention on followup studies. 3. Stable equivocal anterior E urinary bladder wall thickening, similar to prior. 4. Other findings include gist esophageal wall thickening (probably from esophagitis), atherosclerosis ; cholelithiasis ; equivocal distal rectal wall thickening ; and lower lumbar spondylosis and degenerative disc disease causing foraminal impingement at L4-5 and L5-S1.   Electronically Signed   By: Kerry Peck M.D.   On: 01/17/2015 16:20   Ct Abdomen Pelvis W Contrast  01/17/2015   CLINICAL DATA:  Left-sided small cell lung cancer restaging  EXAM: CT CHEST, ABDOMEN, AND PELVIS WITH CONTRAST  TECHNIQUE: Multidetector CT imaging of the chest, abdomen and pelvis was performed following the standard protocol during bolus administration of intravenous contrast.  CONTRAST:  161m OMNIPAQUE IOHEXOL 300 MG/ML  SOLN  COMPARISON:  Multiple exams, including 11/16/2014  FINDINGS: CT CHEST FINDINGS  Mediastinum/Nodes: Distal esophageal wall thickening, measuring 9 mm in single wall thickness on image 49 series 2. Currently no pathologic thoracic adenopathy.  Extensive coronary artery atherosclerotic calcification. Proximal branch vessel atherosclerotic calcification noted.  Lungs/Pleura: New 0.9 by cm nodule medially in the right lower lobe, image 38 series 4.  Left lower lobe nodule  on image 40 of series 4 measures 1.7 by 1.3 cm, formerly 2.0 by 1.5 cm by my measurements. There is an indistinct bandlike density measuring 1.8 by 1.2 by 0.7 cm in the left lower lobe on image 32 series 4, new compared to the prior exam.  Musculoskeletal: Extensive metastatic disease involving the proximal humerus bilaterally, left medial clavicle, left scapular spine and glenoid, thoracic spine, and numerous ribs noted. Some of the previously acute fractured ribs now demonstrate periosteal reaction indicating healing response ; there are new acute fractures of the left  anterior first rib and right lateral sixth rib which are thought to be pathologic. Extensive thoracic spine metastatic disease looks similar to the prior exam with multiple pathologic fractures similar to prior, and better depicted on the thoracic spine MRI exams.  CT ABDOMEN PELVIS FINDINGS  Hepatobiliary: Scattered hepatic metastatic lesions are generally reduced in size compared to the prior exam. For example, a 2.1 by 1.6 cm metastatic lesion in the right hepatic lobe on image 64 of series 2 of the 11/16/2014 exam currently measures 1.1 by 0.9 cm. No definite new hepatic metastatic lesions. Multiple gallstones are noted, measuring up to 1.5 cm in diameter. No biliary dilatation.  Pancreas: The previous low-density lesion in the tip of the tail the pancreas is no longer visible. In general the pancreas is considerably smaller, query accelerated pancreatic atrophy.  Spleen: Unremarkable  Adrenals/Urinary Tract: Anterior wall thickening in the urinary bladder, unchanged.  Stomach/Bowel: Equivocal distal rectal wall thickening, not present previously.  Vascular/Lymphatic: Prior porta hepatis and peripancreatic adenopathy is considerably improved. An index 1.7 cm portacaval node currently measures 0.9 cm in short axis. Aortoiliac atherosclerotic vascular disease.  Reproductive: Unremarkable  Other: No supplemental non-categorized findings.   Musculoskeletal: Severe osseous metastatic disease throughout the lumbar spine and bony pelvis, especially confluent in the right iliac bone and in the sacral ala. There is continued tumor in the anterior presacral space in danger of involving the sacral plexus, although the amount of presacral tumor appears slightly less than previous. No significant progressive collapse compared to prior. Lumbar spondylosis and degenerative disc disease cause foraminal impingement at L4-5 and L5-S1.  IMPRESSION: 1. The dominant left lower lobe lung nodule and the hepatic tumor is improved, and the left anterior presacral tumor extension from the sacrum also appears slightly less, suggesting generalized improvement. Also, the pancreatic tail lesion is no longer readily visible, and the pancreas appears somewhat more atrophic than on the prior exam. 2. The bony lesions are mostly stable, although there several new pathologic rib fractures as detailed above. The extent of osseous metastatic burden is high. New flat nodularity in both lower lobes could well be inflammatory rather than representing new neoplastic pulmonary nodules, but merits attention on followup studies. 3. Stable equivocal anterior E urinary bladder wall thickening, similar to prior. 4. Other findings include gist esophageal wall thickening (probably from esophagitis), atherosclerosis ; cholelithiasis ; equivocal distal rectal wall thickening ; and lower lumbar spondylosis and degenerative disc disease causing foraminal impingement at L4-5 and L5-S1.   Electronically Signed   By: Kerry Peck M.D.   On: 01/17/2015 16:20    ASSESSMENT AND PLAN: this is a very pleasant 54 years old white male recently diagnosed with extensive stage small cell lung cancer status post 6 cycles of systemic chemotherapy with carboplatin and etoposide with partial response. This was followed by prophylactic cranial irradiation in addition to palliative radiotherapy to the T7  thoracic spine and the chest. The CT scan of the Chest, Abdomen and pelvis showed evidence for further disease progression with progressive osseous metastasis and tumor canal encroachment involving the thoracic spine as well as development of hepatic metastasis and nodal metastasis within the upper abdomen. He completed palliative radiation to the upper and lower thoracic spine on 12/13/2014. He is currently being treated with systemic chemotherapy with cisplatin and irinotecan on days 1 and 8 every 3 weeks. He is status post 2 cycles.  He is tolerating his treatment well with the exception of diarrhea.  TThe recent restaging CT  scan revealed improvement in his disease. His hemoglobin has improved and stabilized but his platelet count is sub-optimal at 74,000 to proceed with chemotherapy as scheduled. I will postpone the start of cycle #3 by one week to allow his platelet count to recover. He will follow up in 4 weeks prior to the start of cycle #4. For suspected early cellulitis as the site of a previous IV, i will place him on a 10 day course of Keflex. For the lower extremity edema we will increase the Lasix to 40 mg by mouth daily in addition to KCl 20 meq by mouth daily. For pain management, he will continue on OxyContin and oxycodone for breakthrough pain.  The patient was advised to call immediately if he has any concerning symptoms in the interval. The patient voices understanding of current disease status and treatment options and is in agreement with the current care plan.  All questions were answered. The patient knows to call the clinic with any problems, questions or concerns. We can certainly see the patient much sooner if necessary.  Carlton Adam, PA-C 01/20/2015

## 2015-01-21 LAB — TYPE AND SCREEN
ABO/RH(D): O POS
ANTIBODY SCREEN: NEGATIVE
UNIT DIVISION: 0
UNIT DIVISION: 0
Unit division: 0
Unit division: 0

## 2015-01-25 ENCOUNTER — Telehealth: Payer: Self-pay | Admitting: Internal Medicine

## 2015-01-25 NOTE — Telephone Encounter (Signed)
Lft msg for pt confirming labs/chemo for 06/03 per 05/30 POF, pt will get updated schedule before he leaves chemo.... KJ

## 2015-01-27 ENCOUNTER — Ambulatory Visit: Payer: Federal, State, Local not specified - PPO

## 2015-01-27 ENCOUNTER — Other Ambulatory Visit: Payer: Self-pay | Admitting: Medical Oncology

## 2015-01-27 ENCOUNTER — Other Ambulatory Visit (HOSPITAL_BASED_OUTPATIENT_CLINIC_OR_DEPARTMENT_OTHER): Payer: Federal, State, Local not specified - PPO

## 2015-01-27 DIAGNOSIS — C3490 Malignant neoplasm of unspecified part of unspecified bronchus or lung: Secondary | ICD-10-CM

## 2015-01-27 DIAGNOSIS — R1310 Dysphagia, unspecified: Secondary | ICD-10-CM

## 2015-01-27 DIAGNOSIS — C7A1 Malignant poorly differentiated neuroendocrine tumors: Secondary | ICD-10-CM

## 2015-01-27 DIAGNOSIS — T451X5A Adverse effect of antineoplastic and immunosuppressive drugs, initial encounter: Secondary | ICD-10-CM

## 2015-01-27 DIAGNOSIS — D6481 Anemia due to antineoplastic chemotherapy: Secondary | ICD-10-CM

## 2015-01-27 DIAGNOSIS — C7951 Secondary malignant neoplasm of bone: Secondary | ICD-10-CM

## 2015-01-27 DIAGNOSIS — R790 Abnormal level of blood mineral: Secondary | ICD-10-CM

## 2015-01-27 LAB — CBC WITH DIFFERENTIAL/PLATELET
BASO%: 0.4 % (ref 0.0–2.0)
BASOS ABS: 0 10*3/uL (ref 0.0–0.1)
EOS%: 1.3 % (ref 0.0–7.0)
Eosinophils Absolute: 0 10*3/uL (ref 0.0–0.5)
HCT: 24.8 % — ABNORMAL LOW (ref 38.4–49.9)
HGB: 8.2 g/dL — ABNORMAL LOW (ref 13.0–17.1)
LYMPH#: 1.1 10*3/uL (ref 0.9–3.3)
LYMPH%: 49.8 % — AB (ref 14.0–49.0)
MCH: 30.5 pg (ref 27.2–33.4)
MCHC: 33.1 g/dL (ref 32.0–36.0)
MCV: 92.2 fL (ref 79.3–98.0)
MONO#: 0.4 10*3/uL (ref 0.1–0.9)
MONO%: 18.2 % — ABNORMAL HIGH (ref 0.0–14.0)
NEUT%: 30.3 % — ABNORMAL LOW (ref 39.0–75.0)
NEUTROS ABS: 0.7 10*3/uL — AB (ref 1.5–6.5)
NRBC: 0 % (ref 0–0)
Platelets: 85 10*3/uL — ABNORMAL LOW (ref 140–400)
RBC: 2.69 10*6/uL — AB (ref 4.20–5.82)
RDW: 20.1 % — ABNORMAL HIGH (ref 11.0–14.6)
WBC: 2.3 10*3/uL — ABNORMAL LOW (ref 4.0–10.3)

## 2015-01-27 LAB — COMPREHENSIVE METABOLIC PANEL (CC13)
ALBUMIN: 2.8 g/dL — AB (ref 3.5–5.0)
ALK PHOS: 183 U/L — AB (ref 40–150)
ALT: 15 U/L (ref 0–55)
AST: 26 U/L (ref 5–34)
Anion Gap: 8 mEq/L (ref 3–11)
BUN: 10 mg/dL (ref 7.0–26.0)
CALCIUM: 8.6 mg/dL (ref 8.4–10.4)
CHLORIDE: 106 meq/L (ref 98–109)
CO2: 27 mEq/L (ref 22–29)
CREATININE: 0.5 mg/dL — AB (ref 0.7–1.3)
EGFR: >90 mL/min/{1.73_m2} (ref >90)
GLUCOSE: 88 mg/dL (ref 70–140)
POTASSIUM: 3.9 meq/L (ref 3.5–5.1)
Sodium: 141 mEq/L (ref 136–145)
TOTAL PROTEIN: 5.6 g/dL — AB (ref 6.4–8.3)
Total Bilirubin: 0.71 mg/dL (ref 0.20–1.20)

## 2015-01-27 LAB — MAGNESIUM (CC13): MAGNESIUM: 1 mg/dL — AB (ref 1.5–2.5)

## 2015-01-27 MED ORDER — MAGNESIUM OXIDE 400 (241.3 MG) MG PO TABS: 1.0000 | ORAL_TABLET | Freq: Three times a day (TID) | ORAL | Status: DC

## 2015-01-27 NOTE — Progress Notes (Signed)
Labs reviewed with PA, no treatment, keep all scheduled appt. Pt in lobby, hgb 8.2, pt denies shortness of breath, chest pain, fatigue,  instructed to take magnesium 1 tablet PO TID. Refill sent to pt pharmacy per Desk RN POF to scheduling for MD/PA visit at next lab appt 6/10

## 2015-01-27 NOTE — Progress Notes (Signed)
Spoke to [facility] RN and she will tell pt to increase magnesium oxide and rx sent.

## 2015-01-30 ENCOUNTER — Telehealth: Payer: Self-pay | Admitting: Internal Medicine

## 2015-01-30 NOTE — Telephone Encounter (Signed)
Lft msg for pt confirming MD visit added on for 06/10 per 06/06 POF.... Mailed schedule to pt.Marland Kitchen KJ

## 2015-01-30 NOTE — Progress Notes (Signed)
Quick Note:  Call patient with the result and order Mg Oxide 400 mg po tid ______ 

## 2015-01-31 ENCOUNTER — Other Ambulatory Visit: Payer: Self-pay | Admitting: Physician Assistant

## 2015-01-31 ENCOUNTER — Telehealth: Payer: Self-pay | Admitting: *Deleted

## 2015-01-31 DIAGNOSIS — R790 Abnormal level of blood mineral: Secondary | ICD-10-CM

## 2015-01-31 NOTE — Telephone Encounter (Signed)
Called pt with result, confirmed pt is taking Mg oxide '400mg'$  TID, rx was sent to pt's pharmacy 01/27/15 Pt confirmed, ' I started taking it on Friday" Confirmed appt 6/10 lab/provider No further concerns.

## 2015-01-31 NOTE — Telephone Encounter (Signed)
-----   Message from Curt Bears, MD sent at 01/30/2015  7:35 PM EDT ----- Call patient with the result and order  Mg Oxide 400 mg po tid

## 2015-02-01 ENCOUNTER — Other Ambulatory Visit: Payer: Self-pay | Admitting: *Deleted

## 2015-02-01 ENCOUNTER — Telehealth: Payer: Self-pay | Admitting: Internal Medicine

## 2015-02-01 NOTE — Telephone Encounter (Signed)
s.w. pt and confirmed appt....pt ok and aware °

## 2015-02-03 ENCOUNTER — Ambulatory Visit (HOSPITAL_COMMUNITY)
Admission: RE | Admit: 2015-02-03 | Discharge: 2015-02-03 | Disposition: A | Discharge status: Home / Self Care | Payer: Federal, State, Local not specified - PPO | Source: Ambulatory Visit | Attending: Physician Assistant | Admitting: Physician Assistant

## 2015-02-03 ENCOUNTER — Encounter: Payer: Self-pay | Admitting: Physician Assistant

## 2015-02-03 ENCOUNTER — Other Ambulatory Visit (HOSPITAL_BASED_OUTPATIENT_CLINIC_OR_DEPARTMENT_OTHER): Payer: Federal, State, Local not specified - PPO

## 2015-02-03 ENCOUNTER — Telehealth: Payer: Self-pay | Admitting: Physician Assistant

## 2015-02-03 ENCOUNTER — Ambulatory Visit (HOSPITAL_BASED_OUTPATIENT_CLINIC_OR_DEPARTMENT_OTHER): Payer: Federal, State, Local not specified - PPO | Admitting: Physician Assistant

## 2015-02-03 ENCOUNTER — Other Ambulatory Visit: Payer: Self-pay | Admitting: *Deleted

## 2015-02-03 ENCOUNTER — Telehealth: Payer: Self-pay | Admitting: *Deleted

## 2015-02-03 VITALS — BP 101/59 | HR 93 | Temp 97.8°F | Resp 18 | Ht 72.0 in | Wt 178.1 lb

## 2015-02-03 DIAGNOSIS — R1310 Dysphagia, unspecified: Secondary | ICD-10-CM

## 2015-02-03 DIAGNOSIS — C7A1 Malignant poorly differentiated neuroendocrine tumors: Secondary | ICD-10-CM

## 2015-02-03 DIAGNOSIS — D6481 Anemia due to antineoplastic chemotherapy: Secondary | ICD-10-CM

## 2015-02-03 DIAGNOSIS — C7951 Secondary malignant neoplasm of bone: Secondary | ICD-10-CM

## 2015-02-03 DIAGNOSIS — T451X5A Adverse effect of antineoplastic and immunosuppressive drugs, initial encounter: Secondary | ICD-10-CM

## 2015-02-03 DIAGNOSIS — R600 Localized edema: Secondary | ICD-10-CM | POA: Diagnosis not present

## 2015-02-03 DIAGNOSIS — R609 Edema, unspecified: Secondary | ICD-10-CM | POA: Insufficient documentation

## 2015-02-03 DIAGNOSIS — M79605 Pain in left leg: Secondary | ICD-10-CM | POA: Diagnosis not present

## 2015-02-03 DIAGNOSIS — C7B8 Other secondary neuroendocrine tumors: Secondary | ICD-10-CM

## 2015-02-03 DIAGNOSIS — C3490 Malignant neoplasm of unspecified part of unspecified bronchus or lung: Secondary | ICD-10-CM

## 2015-02-03 LAB — COMPREHENSIVE METABOLIC PANEL (CC13)
ALBUMIN: 2.8 g/dL — AB (ref 3.5–5.0)
ALT: 17 U/L (ref 0–55)
AST: 35 U/L — ABNORMAL HIGH (ref 5–34)
Alkaline Phosphatase: 199 U/L — ABNORMAL HIGH (ref 40–150)
Anion Gap: 12 mEq/L — ABNORMAL HIGH (ref 3–11)
BILIRUBIN TOTAL: 0.53 mg/dL (ref 0.20–1.20)
BUN: 13.1 mg/dL (ref 7.0–26.0)
CO2: 25 meq/L (ref 22–29)
Calcium: 9.4 mg/dL (ref 8.4–10.4)
Chloride: 104 mEq/L (ref 98–109)
Creatinine: 0.7 mg/dL (ref 0.7–1.3)
GLUCOSE: 116 mg/dL (ref 70–140)
Potassium: 4.2 mEq/L (ref 3.5–5.1)
Sodium: 141 mEq/L (ref 136–145)
Total Protein: 6.1 g/dL — ABNORMAL LOW (ref 6.4–8.3)

## 2015-02-03 LAB — CBC WITH DIFFERENTIAL/PLATELET
BASO%: 0.6 % (ref 0.0–2.0)
BASOS ABS: 0 10*3/uL (ref 0.0–0.1)
EOS%: 1.4 % (ref 0.0–7.0)
Eosinophils Absolute: 0.1 10*3/uL (ref 0.0–0.5)
HCT: 24.8 % — ABNORMAL LOW (ref 38.4–49.9)
HGB: 8.1 g/dL — ABNORMAL LOW (ref 13.0–17.1)
LYMPH%: 22 % (ref 14.0–49.0)
MCH: 30.7 pg (ref 27.2–33.4)
MCHC: 32.8 g/dL (ref 32.0–36.0)
MCV: 93.8 fL (ref 79.3–98.0)
MONO#: 0.7 10*3/uL (ref 0.1–0.9)
MONO%: 18.1 % — AB (ref 0.0–14.0)
NEUT#: 2.1 10*3/uL (ref 1.5–6.5)
NEUT%: 57.9 % (ref 39.0–75.0)
PLATELETS: 217 10*3/uL (ref 140–400)
RBC: 2.65 10*6/uL — ABNORMAL LOW (ref 4.20–5.82)
RDW: 22.3 % — ABNORMAL HIGH (ref 11.0–14.6)
WBC: 3.6 10*3/uL — ABNORMAL LOW (ref 4.0–10.3)
lymph#: 0.8 10*3/uL — ABNORMAL LOW (ref 0.9–3.3)

## 2015-02-03 LAB — MAGNESIUM (CC13): Magnesium: 1 mg/dl — CL (ref 1.5–2.5)

## 2015-02-03 MED ORDER — SENNA 8.6 MG PO TABS: 1.0000 | ORAL_TABLET | Freq: Two times a day (BID) | ORAL | Status: DC

## 2015-02-03 MED ORDER — OXYCODONE HCL 10 MG PO TABS: 10.0000 mg | ORAL_TABLET | ORAL | Status: DC | PRN

## 2015-02-03 NOTE — Telephone Encounter (Signed)
TC from [place] in Vascular lab-Doppler exam negative for DVT

## 2015-02-03 NOTE — Patient Instructions (Signed)
Chemotherapies being postponed to evaluate your left lower extremity for possible blood clot Follow-up as scheduled

## 2015-02-03 NOTE — Progress Notes (Signed)
[place] Telephone:([phone]) [phone]   [phone]) [phone]  OFFICE PROGRESS NOTE  [name], MD [place] [address]  DIAGNOSIS: Extensive stage, Stage IV (T2a, N1, M1b) small cell lung cancer diagnosed in July of 2015.  PRIOR THERAPY:  1) Systemic chemotherapy with carboplatin for AUC of 5 on day 1 and etoposide at 120 mg/M2 on days 1, 2 and 3 with Neulasta support on day 4. Status post 6 cycles.  2) prophylactic cranial irradiation under the care of Dr. Sondra Come. 3) palliative radiotherapy to the T7-spine completed on 09/06/2014.  CURRENT THERAPY: Systemic chemotherapy with cisplatin 30 mg/m2 and irinotecan 65 mg /m2 given on days 1 and 8 every 3 weeks. Status post 2 cycles   INTERVAL HISTORY: Kerry Peck 54 y.o. male returns to the clinic today for follow up visit. He is currently being treated with systemic chemotherapy with cisplatin and irinotecan. He is status post 2 cycles. He completed his palliative radiation to the upper and lower thoracic spine on 12/13/2014. His back pain has improved but is still present He presents in a wheelchair.  He denied having any significant fever or chills, no nausea or vomiting. He denied having any significant weight loss or night sweats. The patient has no significant chest pain but continues to have shortness of breath with exertion with no cough or hemoptysis.  He denied fever or chills. He initially reported significant improvement in his bilateral lower extremity edema however noticed today that the left leg is more swollen than the right leg. He also reports of that the left leg is a bit painful wears the right leg is not. He ran out of Lasix and is now been on this medication for several days. He is completing  his course of body aches for suspected early cellulitis in the left antecubital fossa. He presents to proceed with cycle #3 of his systemic chemotherapy with cisplatin and irinotecan. He is  currently taking his magnesium oxide 40 mg by mouth 3 times daily.   MEDICAL HISTORY: Past Medical History  Diagnosis Date  . Diabetes mellitus without complication   . Hypertension   . Smoking addiction   . Cancer     lung and liver  . Radiation 08/16/14-09/06/14    prophylatic cranial radiation 25 gray, chest, t-spine  35 gray  . Radiation 09/29/14-10/12/14    left hilar area 30 gray, right pelvis 30 gray, right lower rib cage area 30 gray  . 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 Lung cancer   . Liver cancer     ALLERGIES:  has No Known Allergies.  MEDICATIONS:  Current Outpatient Prescriptions  Medication Sig Dispense Refill  . albuterol (PROVENTIL HFA;VENTOLIN HFA) 108 (90 BASE) MCG/ACT inhaler Inhale 2 puffs into the lungs every 6 (six) hours as needed for wheezing. 1 Inhaler 0  . alum & mag hydroxide-simeth (MAALOX/MYLANTA) 200-200-20 MG/5ML suspension Take 30 mLs by mouth every 6 (six) hours as needed for indigestion or heartburn (indigestion).    Marland Kitchen amLODipine (NORVASC) 10 MG tablet Take 10 mg by mouth daily.     Marland Kitchen atorvastatin (LIPITOR) 40 MG tablet Take 40 mg by mouth daily.    . benzocaine (ORAJEL) 10 % mucosal gel Use as directed 1 application in the mouth or throat 3 (three) times daily as needed for mouth pain (mouth pain).    . bismuth subsalicylate (PEPTO BISMOL) 262 MG/15ML suspension Take 30 mLs by mouth every 6 (six)  hours as needed for indigestion (indigestion).    . calcium-vitamin D (OSCAL WITH D) 500-200 MG-UNIT per tablet Take 1 tablet by mouth daily with breakfast.     . cephALEXin (KEFLEX) 500 MG capsule Take 1 capsule (500 mg total) by mouth 3 (three) times daily. 30 capsule 0  . emollient (BIAFINE) cream Apply topically as needed. (Patient taking differently: Apply 1 application topically as needed. For radiation itch.) 454 g 0  . Fluticasone-Salmeterol (ADVAIR) 250-50 MCG/DOSE AEPB Inhale 2 puffs into the lungs 2 (two) times daily as needed (wheezing).       . furosemide (LASIX) 20 MG tablet Take 2 tablets by mouth daily or as instructed 30 tablet 0  . magnesium oxide (MAGNESIUM-OXIDE) 400 (241.3 MG) MG tablet Take 1 tablet (400 mg total) by mouth 3 (three) times daily. 90 tablet 1  . metFORMIN (GLUCOPHAGE) 1000 MG tablet Take 1,000 mg by mouth 2 (two) times daily with a meal.    . Multiple Vitamins-Minerals (CENTRUM SILVER ADULT 50+ PO) Take 1 tablet by mouth daily.     . ondansetron (ZOFRAN) 8 MG tablet Take 8 mg by mouth every 8 (eight) hours as needed for nausea or vomiting (nause and vomiting). Disp. 20 tablets. 1 refill.  Called in to [facility] per Dr. Sondra Come on 11/24/14.    Marland Kitchen ONE TOUCH ULTRA TEST test strip 1 each by Other route 3 (three) times daily.     . Oxycodone HCl 10 MG TABS Take 1-2 tablets (10-20 mg total) by mouth every 4 (four) hours as needed. 60 tablet 0  . OxyCODONE HCl ER 60 MG T12A Take 60 mg by mouth every 12 (twelve) hours. 60 each 0  . pantoprazole (PROTONIX) 40 MG tablet Take 40 mg by mouth daily.     . polyethylene glycol (MIRALAX / GLYCOLAX) packet Take 17 g by mouth daily. 14 each 0  . potassium chloride SA (K-DUR,KLOR-CON) 20 MEQ tablet Take 1 tablet (20 mEq total) by mouth daily. 30 tablet 0  . pyridOXINE (VITAMIN B-6) 100 MG tablet Take 100 mg by mouth daily.    Marland Kitchen senna (SENOKOT) 8.6 MG TABS tablet Take 1 tablet (8.6 mg total) by mouth 2 (two) times daily. 120 each 0  . sucralfate (CARAFATE) 1 GM/10ML suspension Take 10 mLs (1 g total) by mouth 4 (four) times daily -  with meals and at bedtime. (Patient taking differently: Take 1 g by mouth 3 (three) times daily as needed (throat pain). ) 420 mL 1  . vitamin C (ASCORBIC ACID) 500 MG tablet Take 500 mg by mouth daily.    . Insulin Glargine (LANTUS) 100 UNIT/ML Solostar Pen Inject 15 Units into the skin daily. (Patient not taking: Reported on 02/03/2015) 15 mL 0  . Insulin Pen Needle (PEN NEEDLES 3/16") 31G X 5 MM MISC Use daily with your Lantus injection (Patient not  taking: Reported on 02/03/2015) 90 each 0   No current facility-administered medications for this visit.   Facility-Administered Medications Ordered in Other Visits  Medication Dose Route Frequency Provider Last Rate Last Dose  . morphine 4 MG/ML injection 2 mg  2 mg Intravenous Q30 min PRN Susanne Borders, NP   2 mg at 11/23/14 1345    REVIEW OF SYSTEMS:  Constitutional: positive for anorexia, fatigue and weight loss Eyes: negative Ears, nose, mouth, throat, and face: negative Respiratory: positive for dyspnea on exertion Cardiovascular: negative Gastrointestinal: positive for diarrhea Genitourinary:negative Integument/breast: negative Hematologic/lymphatic: negative Musculoskeletal:positive for back pain and bone  pain Neurological: negative Behavioral/Psych: positive for sleep disturbance Endocrine: negative Allergic/Immunologic: negative   PHYSICAL EXAMINATION: General appearance: alert, cooperative and no distress Head: Normocephalic, without obvious abnormality, atraumatic Neck: no adenopathy, no JVD, supple, symmetrical, trachea midline and thyroid not enlarged, symmetric, no tenderness/mass/nodules Lymph nodes: Cervical, supraclavicular, and axillary nodes normal. Resp: clear to auscultation bilaterally Back: symmetric, no curvature. ROM normal. No CVA tenderness. Cardio: regular rate and rhythm, S1, S2 normal, no murmur, click, rub or gallop GI: soft, non-tender; bowel sounds normal; no masses,  no organomegaly Extremities: edema Trace pitting edema right lower extremity I am able to help 8 the pulses however there is 2+ pitting edema left lower extremity on I am unable to palpate the pulses there is warmth tenderness and erythema in the left lower extremity as well Neurologic: Grossly normal Skin: Left antecubital fossa erythema and pustule have now resolved   ECOG PERFORMANCE STATUS: 2 - Symptomatic, <50% confined to bed  Blood pressure 101/59, pulse 93, temperature  97.8 F (36.6 C), temperature source Oral, resp. rate 18, height 6' (1.829 m), weight 178 lb 1.6 oz (80.786 kg), SpO2 100 %.  LABORATORY DATA: Lab Results  Component Value Date   WBC 3.6* 02/03/2015   HGB 8.1* 02/03/2015   HCT 24.8* 02/03/2015   MCV 93.8 02/03/2015   PLT 217 02/03/2015      Chemistry      Component Value Date/Time   NA 141 02/03/2015 1349   NA 133* 01/18/2015 0243   K 4.2 02/03/2015 1349   K 3.6 01/18/2015 0243   CL 98* 01/18/2015 0243   CO2 25 02/03/2015 1349   CO2 28 01/18/2015 0243   BUN 13.1 02/03/2015 1349   BUN 6 01/18/2015 0243   CREATININE 0.7 02/03/2015 1349   CREATININE <0.30* 01/18/2015 0243      Component Value Date/Time   CALCIUM 9.4 02/03/2015 1349   CALCIUM 7.6* 01/18/2015 0243   ALKPHOS 199* 02/03/2015 1349   ALKPHOS 151* 12/09/2014 1027   AST 35* 02/03/2015 1349   AST 54* 12/09/2014 1027   ALT 17 02/03/2015 1349   ALT 46 12/09/2014 1027   BILITOT 0.53 02/03/2015 1349   BILITOT 0.3 12/09/2014 1027       RADIOGRAPHIC STUDIES: Ct Chest W Contrast  01/17/2015   CLINICAL DATA:  Left-sided small cell lung cancer restaging  EXAM: CT CHEST, ABDOMEN, AND PELVIS WITH CONTRAST  TECHNIQUE: Multidetector CT imaging of the chest, abdomen and pelvis was performed following the standard protocol during bolus administration of intravenous contrast.  CONTRAST:  141m OMNIPAQUE IOHEXOL 300 MG/ML  SOLN  COMPARISON:  Multiple exams, including 11/16/2014  FINDINGS: CT CHEST FINDINGS  Mediastinum/Nodes: Distal esophageal wall thickening, measuring 9 mm in single wall thickness on image 49 series 2. Currently no pathologic thoracic adenopathy.  Extensive coronary artery atherosclerotic calcification. Proximal branch vessel atherosclerotic calcification noted.  Lungs/Pleura: New 0.9 by cm nodule medially in the right lower lobe, image 38 series 4.  Left lower lobe nodule on image 40 of series 4 measures 1.7 by 1.3 cm, formerly 2.0 by 1.5 cm by my measurements.  There is an indistinct bandlike density measuring 1.8 by 1.2 by 0.7 cm in the left lower lobe on image 32 series 4, new compared to the prior exam.  Musculoskeletal: Extensive metastatic disease involving the proximal humerus bilaterally, left medial clavicle, left scapular spine and glenoid, thoracic spine, and numerous ribs noted. Some of the previously acute fractured ribs now demonstrate periosteal reaction indicating healing response ;  there are new acute fractures of the left anterior first rib and right lateral sixth rib which are thought to be pathologic. Extensive thoracic spine metastatic disease looks similar to the prior exam with multiple pathologic fractures similar to prior, and better depicted on the thoracic spine MRI exams.  CT ABDOMEN PELVIS FINDINGS  Hepatobiliary: Scattered hepatic metastatic lesions are generally reduced in size compared to the prior exam. For example, a 2.1 by 1.6 cm metastatic lesion in the right hepatic lobe on image 64 of series 2 of the 11/16/2014 exam currently measures 1.1 by 0.9 cm. No definite new hepatic metastatic lesions. Multiple gallstones are noted, measuring up to 1.5 cm in diameter. No biliary dilatation.  Pancreas: The previous low-density lesion in the tip of the tail the pancreas is no longer visible. In general the pancreas is considerably smaller, query accelerated pancreatic atrophy.  Spleen: Unremarkable  Adrenals/Urinary Tract: Anterior wall thickening in the urinary bladder, unchanged.  Stomach/Bowel: Equivocal distal rectal wall thickening, not present previously.  Vascular/Lymphatic: Prior porta hepatis and peripancreatic adenopathy is considerably improved. An index 1.7 cm portacaval node currently measures 0.9 cm in short axis. Aortoiliac atherosclerotic vascular disease.  Reproductive: Unremarkable  Other: No supplemental non-categorized findings.  Musculoskeletal: Severe osseous metastatic disease throughout the lumbar spine and bony pelvis,  especially confluent in the right iliac bone and in the sacral ala. There is continued tumor in the anterior presacral space in danger of involving the sacral plexus, although the amount of presacral tumor appears slightly less than previous. No significant progressive collapse compared to prior. Lumbar spondylosis and degenerative disc disease cause foraminal impingement at L4-5 and L5-S1.  IMPRESSION: 1. The dominant left lower lobe lung nodule and the hepatic tumor is improved, and the left anterior presacral tumor extension from the sacrum also appears slightly less, suggesting generalized improvement. Also, the pancreatic tail lesion is no longer readily visible, and the pancreas appears somewhat more atrophic than on the prior exam. 2. The bony lesions are mostly stable, although there several new pathologic rib fractures as detailed above. The extent of osseous metastatic burden is high. New flat nodularity in both lower lobes could well be inflammatory rather than representing new neoplastic pulmonary nodules, but merits attention on followup studies. 3. Stable equivocal anterior E urinary bladder wall thickening, similar to prior. 4. Other findings include gist esophageal wall thickening (probably from esophagitis), atherosclerosis ; cholelithiasis ; equivocal distal rectal wall thickening ; and lower lumbar spondylosis and degenerative disc disease causing foraminal impingement at L4-5 and L5-S1.   Electronically Signed   By: Van Clines M.D.   On: 01/17/2015 16:20   Ct Abdomen Pelvis W Contrast  01/17/2015   CLINICAL DATA:  Left-sided small cell lung cancer restaging  EXAM: CT CHEST, ABDOMEN, AND PELVIS WITH CONTRAST  TECHNIQUE: Multidetector CT imaging of the chest, abdomen and pelvis was performed following the standard protocol during bolus administration of intravenous contrast.  CONTRAST:  118m OMNIPAQUE IOHEXOL 300 MG/ML  SOLN  COMPARISON:  Multiple exams, including 11/16/2014  FINDINGS:  CT CHEST FINDINGS  Mediastinum/Nodes: Distal esophageal wall thickening, measuring 9 mm in single wall thickness on image 49 series 2. Currently no pathologic thoracic adenopathy.  Extensive coronary artery atherosclerotic calcification. Proximal branch vessel atherosclerotic calcification noted.  Lungs/Pleura: New 0.9 by cm nodule medially in the right lower lobe, image 38 series 4.  Left lower lobe nodule on image 40 of series 4 measures 1.7 by 1.3 cm, formerly 2.0 by 1.5 cm by  my measurements. There is an indistinct bandlike density measuring 1.8 by 1.2 by 0.7 cm in the left lower lobe on image 32 series 4, new compared to the prior exam.  Musculoskeletal: Extensive metastatic disease involving the proximal humerus bilaterally, left medial clavicle, left scapular spine and glenoid, thoracic spine, and numerous ribs noted. Some of the previously acute fractured ribs now demonstrate periosteal reaction indicating healing response ; there are new acute fractures of the left anterior first rib and right lateral sixth rib which are thought to be pathologic. Extensive thoracic spine metastatic disease looks similar to the prior exam with multiple pathologic fractures similar to prior, and better depicted on the thoracic spine MRI exams.  CT ABDOMEN PELVIS FINDINGS  Hepatobiliary: Scattered hepatic metastatic lesions are generally reduced in size compared to the prior exam. For example, a 2.1 by 1.6 cm metastatic lesion in the right hepatic lobe on image 64 of series 2 of the 11/16/2014 exam currently measures 1.1 by 0.9 cm. No definite new hepatic metastatic lesions. Multiple gallstones are noted, measuring up to 1.5 cm in diameter. No biliary dilatation.  Pancreas: The previous low-density lesion in the tip of the tail the pancreas is no longer visible. In general the pancreas is considerably smaller, query accelerated pancreatic atrophy.  Spleen: Unremarkable  Adrenals/Urinary Tract: Anterior wall thickening in the  urinary bladder, unchanged.  Stomach/Bowel: Equivocal distal rectal wall thickening, not present previously.  Vascular/Lymphatic: Prior porta hepatis and peripancreatic adenopathy is considerably improved. An index 1.7 cm portacaval node currently measures 0.9 cm in short axis. Aortoiliac atherosclerotic vascular disease.  Reproductive: Unremarkable  Other: No supplemental non-categorized findings.  Musculoskeletal: Severe osseous metastatic disease throughout the lumbar spine and bony pelvis, especially confluent in the right iliac bone and in the sacral ala. There is continued tumor in the anterior presacral space in danger of involving the sacral plexus, although the amount of presacral tumor appears slightly less than previous. No significant progressive collapse compared to prior. Lumbar spondylosis and degenerative disc disease cause foraminal impingement at L4-5 and L5-S1.  IMPRESSION: 1. The dominant left lower lobe lung nodule and the hepatic tumor is improved, and the left anterior presacral tumor extension from the sacrum also appears slightly less, suggesting generalized improvement. Also, the pancreatic tail lesion is no longer readily visible, and the pancreas appears somewhat more atrophic than on the prior exam. 2. The bony lesions are mostly stable, although there several new pathologic rib fractures as detailed above. The extent of osseous metastatic burden is high. New flat nodularity in both lower lobes could well be inflammatory rather than representing new neoplastic pulmonary nodules, but merits attention on followup studies. 3. Stable equivocal anterior E urinary bladder wall thickening, similar to prior. 4. Other findings include gist esophageal wall thickening (probably from esophagitis), atherosclerosis ; cholelithiasis ; equivocal distal rectal wall thickening ; and lower lumbar spondylosis and degenerative disc disease causing foraminal impingement at L4-5 and L5-S1.   Electronically  Signed   By: Van Clines M.D.   On: 01/17/2015 16:20    ASSESSMENT AND PLAN: this is a very pleasant 54 years old white male recently diagnosed with extensive stage small cell lung cancer status post 6 cycles of systemic chemotherapy with carboplatin and etoposide with partial response. This was followed by prophylactic cranial irradiation in addition to palliative radiotherapy to the T7 thoracic spine and the chest. The CT scan of the Chest, Abdomen and pelvis showed evidence for further disease progression with progressive osseous metastasis  and tumor canal encroachment involving the thoracic spine as well as development of hepatic metastasis and nodal metastasis within the upper abdomen. He completed palliative radiation to the upper and lower thoracic spine on 12/13/2014. He is currently being treated with systemic chemotherapy with cisplatin and irinotecan on days 1 and 8 every 3 weeks. He is status post 2 cycles.  He is tolerating his treatment well with the exception of diarrhea.  The recent restaging CT scan revealed improvement in his disease. He presented to start day 1 of cycle #3 today however in view of his unilateral left lower extremity edema, I will again postpone the start of cycle #3 x 1 week and instead obtain a left lower extremity Doppler to evaluate for possible deep vein thrombosis. I have made Dr. Marin Olp, the physician on call, aware that Mr. Noah has been sent to [facility] have the Doppler study done in that he would be called with the results.  He will follow up prior to the start of cycle #4. Suspected early cellulitis as the site of a previous IV, resolved. Patient to complete the 10 day course of X as prescribed. For the lower extremity edema we will hold the Lasix to 40 mg by mouth daily in addition to KCl 20 meq by mouth daily for now. For pain management, he will continue on OxyContin and oxycodone for breakthrough pain. He was given a refill prescription for his  oxycodone tablets. A refill for his senna was sent to his pharmacy of record via E scribe. The patient was advised to call immediately if he has any concerning symptoms in the interval. The patient voices understanding of current disease status and treatment options and is in agreement with the current care plan.  All questions were answered. The patient knows to call the clinic with any problems, questions or concerns. We can certainly see the patient much sooner if necessary.  Carlton Adam, PA-C 02/03/2015

## 2015-02-03 NOTE — Progress Notes (Signed)
Left lower extremity venous duplex completed.  Left:  No evidence of DVT, superficial thrombosis, or Baker's cyst.  Right:  Negative for DVT in the common femoral vein.  

## 2015-02-03 NOTE — Telephone Encounter (Addendum)
Pt confirmed labs/ov per 06/10 POF, gave pt AVS and Calendar.... KJ, sent msg to add chemo and pt has been schedule to go to [facility] for his left lower extremity doppler for today.Marland KitchenMarland Kitchen

## 2015-02-10 ENCOUNTER — Telehealth: Payer: Self-pay | Admitting: Physician Assistant

## 2015-02-10 ENCOUNTER — Other Ambulatory Visit: Payer: Self-pay | Admitting: Physician Assistant

## 2015-02-10 ENCOUNTER — Other Ambulatory Visit (HOSPITAL_BASED_OUTPATIENT_CLINIC_OR_DEPARTMENT_OTHER): Payer: Federal, State, Local not specified - PPO

## 2015-02-10 ENCOUNTER — Encounter: Payer: Self-pay | Admitting: Physician Assistant

## 2015-02-10 ENCOUNTER — Ambulatory Visit (HOSPITAL_COMMUNITY)
Admission: RE | Admit: 2015-02-10 | Discharge: 2015-02-10 | Disposition: A | Discharge status: Home / Self Care | Payer: Federal, State, Local not specified - PPO | Source: Ambulatory Visit | Attending: Internal Medicine | Admitting: Internal Medicine

## 2015-02-10 ENCOUNTER — Ambulatory Visit (HOSPITAL_BASED_OUTPATIENT_CLINIC_OR_DEPARTMENT_OTHER): Payer: Federal, State, Local not specified - PPO | Admitting: Physician Assistant

## 2015-02-10 ENCOUNTER — Ambulatory Visit (HOSPITAL_BASED_OUTPATIENT_CLINIC_OR_DEPARTMENT_OTHER): Payer: Federal, State, Local not specified - PPO

## 2015-02-10 VITALS — BP 96/61 | HR 108 | Temp 98.3°F | Resp 18 | Ht 72.0 in | Wt 176.4 lb

## 2015-02-10 DIAGNOSIS — Z5111 Encounter for antineoplastic chemotherapy: Secondary | ICD-10-CM

## 2015-02-10 DIAGNOSIS — C7951 Secondary malignant neoplasm of bone: Secondary | ICD-10-CM

## 2015-02-10 DIAGNOSIS — C7A1 Malignant poorly differentiated neuroendocrine tumors: Secondary | ICD-10-CM | POA: Diagnosis not present

## 2015-02-10 DIAGNOSIS — C7B8 Other secondary neuroendocrine tumors: Secondary | ICD-10-CM | POA: Diagnosis not present

## 2015-02-10 DIAGNOSIS — C3490 Malignant neoplasm of unspecified part of unspecified bronchus or lung: Secondary | ICD-10-CM

## 2015-02-10 DIAGNOSIS — R52 Pain, unspecified: Secondary | ICD-10-CM

## 2015-02-10 DIAGNOSIS — D6481 Anemia due to antineoplastic chemotherapy: Secondary | ICD-10-CM

## 2015-02-10 DIAGNOSIS — T451X5A Adverse effect of antineoplastic and immunosuppressive drugs, initial encounter: Secondary | ICD-10-CM | POA: Diagnosis not present

## 2015-02-10 DIAGNOSIS — R1310 Dysphagia, unspecified: Secondary | ICD-10-CM

## 2015-02-10 DIAGNOSIS — E119 Type 2 diabetes mellitus without complications: Secondary | ICD-10-CM

## 2015-02-10 DIAGNOSIS — C7B02 Secondary carcinoid tumors of liver: Secondary | ICD-10-CM | POA: Diagnosis not present

## 2015-02-10 LAB — CBC WITH DIFFERENTIAL/PLATELET
BASO%: 0.4 % (ref 0.0–2.0)
BASOS ABS: 0 10*3/uL (ref 0.0–0.1)
EOS%: 6.1 % (ref 0.0–7.0)
Eosinophils Absolute: 0.3 10*3/uL (ref 0.0–0.5)
HCT: 24.4 % — ABNORMAL LOW (ref 38.4–49.9)
HEMOGLOBIN: 8 g/dL — AB (ref 13.0–17.1)
LYMPH#: 1.1 10*3/uL (ref 0.9–3.3)
LYMPH%: 21.3 % (ref 14.0–49.0)
MCH: 31 pg (ref 27.2–33.4)
MCHC: 32.8 g/dL (ref 32.0–36.0)
MCV: 94.6 fL (ref 79.3–98.0)
MONO#: 0.9 10*3/uL (ref 0.1–0.9)
MONO%: 17 % — ABNORMAL HIGH (ref 0.0–14.0)
NEUT#: 2.8 10*3/uL (ref 1.5–6.5)
NEUT%: 55.2 % (ref 39.0–75.0)
Platelets: 187 10*3/uL (ref 140–400)
RBC: 2.58 10*6/uL — ABNORMAL LOW (ref 4.20–5.82)
RDW: 20.1 % — ABNORMAL HIGH (ref 11.0–14.6)
WBC: 5.1 10*3/uL (ref 4.0–10.3)

## 2015-02-10 LAB — MAGNESIUM (CC13): MAGNESIUM: 1.2 mg/dL — AB (ref 1.5–2.5)

## 2015-02-10 LAB — COMPREHENSIVE METABOLIC PANEL (CC13)
ALK PHOS: 215 U/L — AB (ref 40–150)
ALT: 23 U/L (ref 0–55)
AST: 43 U/L — AB (ref 5–34)
Albumin: 2.3 g/dL — ABNORMAL LOW (ref 3.5–5.0)
Anion Gap: 8 mEq/L (ref 3–11)
BILIRUBIN TOTAL: 0.66 mg/dL (ref 0.20–1.20)
BUN: 10.1 mg/dL (ref 7.0–26.0)
CO2: 32 mEq/L — ABNORMAL HIGH (ref 22–29)
Calcium: 10.5 mg/dL — ABNORMAL HIGH (ref 8.4–10.4)
Chloride: 100 mEq/L (ref 98–109)
Creatinine: 0.6 mg/dL — ABNORMAL LOW (ref 0.7–1.3)
GLUCOSE: 82 mg/dL (ref 70–140)
Potassium: 3.7 mEq/L (ref 3.5–5.1)
Sodium: 140 mEq/L (ref 136–145)
TOTAL PROTEIN: 5.5 g/dL — AB (ref 6.4–8.3)

## 2015-02-10 LAB — TECHNOLOGIST REVIEW

## 2015-02-10 MED ORDER — OXYCODONE HCL ER 60 MG PO T12A: EXTENDED_RELEASE_TABLET | ORAL | Status: DC

## 2015-02-10 MED ORDER — POTASSIUM CHLORIDE 2 MEQ/ML IV SOLN
Freq: Once | INTRAVENOUS | Status: AC
  Administered 2015-02-10: 11:00:00 via INTRAVENOUS
  Filled 2015-02-10: qty 10

## 2015-02-10 MED ORDER — SODIUM CHLORIDE 0.9 % IV SOLN
30.0000 mg/m2 | Freq: Once | INTRAVENOUS | Status: AC
  Administered 2015-02-10: 65 mg via INTRAVENOUS
  Filled 2015-02-10: qty 65

## 2015-02-10 MED ORDER — ATROPINE SULFATE 1 MG/ML IJ SOLN
0.5000 mg | Freq: Once | INTRAMUSCULAR | Status: AC | PRN
  Administered 2015-02-10: 0.5 mg via INTRAVENOUS

## 2015-02-10 MED ORDER — ZOLEDRONIC ACID 4 MG/100ML IV SOLN
4.0000 mg | Freq: Once | INTRAVENOUS | Status: AC
  Administered 2015-02-10: 4 mg via INTRAVENOUS
  Filled 2015-02-10: qty 100

## 2015-02-10 MED ORDER — SODIUM CHLORIDE 0.9 % IV SOLN
Freq: Once | INTRAVENOUS | Status: AC
  Administered 2015-02-10: 10:00:00 via INTRAVENOUS

## 2015-02-10 MED ORDER — ATROPINE SULFATE 1 MG/ML IJ SOLN
INTRAMUSCULAR | Status: AC
  Filled 2015-02-10: qty 1

## 2015-02-10 MED ORDER — FUROSEMIDE 10 MG/ML IJ SOLN
10.0000 mg | Freq: Once | INTRAMUSCULAR | Status: AC
  Administered 2015-02-10: 10 mg via INTRAVENOUS

## 2015-02-10 MED ORDER — PALONOSETRON HCL INJECTION 0.25 MG/5ML
INTRAVENOUS | Status: AC
  Filled 2015-02-10: qty 5

## 2015-02-10 MED ORDER — PALONOSETRON HCL INJECTION 0.25 MG/5ML
0.2500 mg | Freq: Once | INTRAVENOUS | Status: AC
  Administered 2015-02-10: 0.25 mg via INTRAVENOUS

## 2015-02-10 MED ORDER — FOSAPREPITANT DIMEGLUMINE INJECTION 150 MG
Freq: Once | INTRAVENOUS | Status: AC
  Administered 2015-02-10: 14:00:00 via INTRAVENOUS
  Filled 2015-02-10: qty 5

## 2015-02-10 MED ORDER — IRINOTECAN HCL CHEMO INJECTION 100 MG/5ML
65.0000 mg/m2 | Freq: Once | INTRAVENOUS | Status: AC
  Administered 2015-02-10: 140 mg via INTRAVENOUS
  Filled 2015-02-10: qty 7

## 2015-02-10 MED ORDER — FUROSEMIDE 10 MG/ML IJ SOLN: 10.0000 mg | Freq: Once | INTRAMUSCULAR | Status: DC

## 2015-02-10 NOTE — Patient Instructions (Signed)
[facility] Discharge Instructions for Patients Receiving Chemotherapy  Today you received the following chemotherapy agents irinotecan/cisplatin   To help prevent nausea and vomiting after your treatment, we encourage you to take your nausea medication as directed  If you develop nausea and vomiting that is not controlled by your nausea medication, call the clinic.   BELOW ARE SYMPTOMS THAT SHOULD BE REPORTED IMMEDIATELY:  *FEVER GREATER THAN 100.5 F  *CHILLS WITH OR WITHOUT FEVER  NAUSEA AND VOMITING THAT IS NOT CONTROLLED WITH YOUR NAUSEA MEDICATION  *UNUSUAL SHORTNESS OF BREATH  *UNUSUAL BRUISING OR BLEEDING  TENDERNESS IN MOUTH AND THROAT WITH OR WITHOUT PRESENCE OF ULCERS  *URINARY PROBLEMS  *BOWEL PROBLEMS  UNUSUAL RASH Items with * indicate a potential emergency and should be followed up as soon as possible.  Feel free to call the clinic you have any questions or concerns. The clinic phone number is [phone].  

## 2015-02-10 NOTE — Progress Notes (Signed)
Quick Note:  Call patient with the result and increase his Mg oxide dose by 400 mg ______

## 2015-02-10 NOTE — Progress Notes (Signed)
Per Awilda Metro, PAC ok to proceed with chemotherapy without required urine output.  Pt encouraged to drink more fluids.

## 2015-02-10 NOTE — Telephone Encounter (Signed)
gave pt contrast for scan

## 2015-02-10 NOTE — Telephone Encounter (Signed)
per pof to sch pt blood trans-cld MW-she sch-gave pt time & date-avs

## 2015-02-10 NOTE — Patient Instructions (Addendum)
Continue labs and chemotherapy as scheduled Return tomorrow (02/11/2015)  For a blood transfusion to address your chemotherapy induced anemia Follow-up in 3 weeks with a restaging CT scan of your chest, abdomen and pelvis to reevaluate your disease

## 2015-02-10 NOTE — Progress Notes (Signed)
[place] [phone]   [phone]  OFFICE PROGRESS NOTE  Kerry Peck., MD [place] [address]  DIAGNOSIS: Extensive stage, Stage IV (T2a, N1, M1b) small cell lung cancer diagnosed in July of 2015.  PRIOR THERAPY:  1) Systemic chemotherapy with carboplatin for AUC of 5 on day 1 and etoposide at 120 mg/M2 on days 1, 2 and 3 with Neulasta support on day 4. Status post 6 cycles.  2) prophylactic cranial irradiation under the care of Dr. Sondra Come. 3) palliative radiotherapy to the T7-spine completed on 09/06/2014.  CURRENT THERAPY: Systemic chemotherapy with cisplatin 30 mg/m2 and irinotecan 65 mg /m2 given on days 1 and 8 every 3 weeks. Status post 2 cycles   INTERVAL HISTORY: Kerry Peck 54 y.o. male returns to the clinic today for follow up visit. He is currently being treated with systemic chemotherapy with cisplatin and irinotecan. He is status post 2 cycles. He completed his palliative radiation to the upper and lower thoracic spine on 12/13/2014. His back pain has improved but is still present He presents in a wheelchair.  He denied having any significant fever or chills. He reports some nausea and vomiting met reports his current antiemetics controls the symptoms in reasonably well. He complains of increased pain particularly in the right "collarbone" and left anterior/lateral rib cage. He reports having to increase the use of his breakthrough pain medication as a result He was evaluated for possible deep vein thromboses due to increased left lower extremity edema. These studies were negative for deep vein thrombosis (both legs were studied).  He reports  remote "vein harvesting" in the left lower extremity.  He denied having any significant weight loss or night sweats. The patient has no significant chest pain but continues to have shortness of breath with exertion with no cough or hemoptysis.  He denied fever or chills. He  presents to proceed with cycle #3 of his systemic chemotherapy with cisplatin and irinotecan. He is currently taking his magnesium oxide 40 mg by mouth 4 times daily.   MEDICAL HISTORY: Past Medical History  Diagnosis Date  . Diabetes mellitus without complication   . Hypertension   . Smoking addiction   . Cancer     lung and liver  . Radiation 08/16/14-09/06/14    prophylatic cranial radiation 25 gray, chest, t-spine  35 gray  . Radiation 09/29/14-10/12/14    left hilar area 30 gray, right pelvis 30 gray, right lower rib cage area 30 gray  . 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 Lung cancer   . Liver cancer     ALLERGIES:  has No Known Allergies.  MEDICATIONS:  Current Outpatient Prescriptions  Medication Sig Dispense Refill  . albuterol (PROVENTIL HFA;VENTOLIN HFA) 108 (90 BASE) MCG/ACT inhaler Inhale 2 puffs into the lungs every 6 (six) hours as needed for wheezing. 1 Inhaler 0  . alum & mag hydroxide-simeth (MAALOX/MYLANTA) 200-200-20 MG/5ML suspension Take 30 mLs by mouth every 6 (six) hours as needed for indigestion or heartburn (indigestion).    Marland Kitchen amLODipine (NORVASC) 10 MG tablet Take 10 mg by mouth daily.     Marland Kitchen atorvastatin (LIPITOR) 40 MG tablet Take 40 mg by mouth daily.    . benzocaine (ORAJEL) 10 % mucosal gel Use as directed 1 application in the mouth or throat 3 (three) times daily as needed for mouth pain (mouth pain).    . bismuth subsalicylate (PEPTO BISMOL) 262 MG/15ML suspension Take  30 mLs by mouth every 6 (six) hours as needed for indigestion (indigestion).    . calcium-vitamin D (OSCAL WITH D) 500-200 MG-UNIT per tablet Take 1 tablet by mouth daily with breakfast.     . cephALEXin (KEFLEX) 500 MG capsule Take 1 capsule (500 mg total) by mouth 3 (three) times daily. 30 capsule 0  . emollient (BIAFINE) cream Apply topically as needed. (Patient taking differently: Apply 1 application topically as needed. For radiation itch.) 454 g 0  . Fluticasone-Salmeterol  (ADVAIR) 250-50 MCG/DOSE AEPB Inhale 2 puffs into the lungs 2 (two) times daily as needed (wheezing).     . furosemide (LASIX) 20 MG tablet Take 2 tablets by mouth daily or as instructed 30 tablet 0  . Insulin Glargine (LANTUS) 100 UNIT/ML Solostar Pen Inject 15 Units into the skin daily. 15 mL 0  . Insulin Pen Needle (PEN NEEDLES 3/16") 31G X 5 MM MISC Use daily with your Lantus injection 90 each 0  . magnesium oxide (MAGNESIUM-OXIDE) 400 (241.3 MG) MG tablet Take 1 tablet (400 mg total) by mouth 3 (three) times daily. (Patient taking differently: Take 2 tablets by mouth 3 (three) times daily. ) 90 tablet 1  . metFORMIN (GLUCOPHAGE) 1000 MG tablet Take 1,000 mg by mouth 2 (two) times daily with a meal.    . Multiple Vitamins-Minerals (CENTRUM SILVER ADULT 50+ PO) Take 1 tablet by mouth daily.     . ondansetron (ZOFRAN) 8 MG tablet Take 8 mg by mouth every 8 (eight) hours as needed for nausea or vomiting (nause and vomiting). Disp. 20 tablets. 1 refill.  Called in to [facility] per Dr. Sondra Come on 11/24/14.    Marland Kitchen ONE TOUCH ULTRA TEST test strip 1 each by Other route 3 (three) times daily.     . Oxycodone HCl 10 MG TABS Take 1-2 tablets (10-20 mg total) by mouth every 4 (four) hours as needed. 60 tablet 0  . OxyCODONE HCl ER 60 MG T12A Take one tablet (60 mg) by mouth every 8 hours 90 each 0  . pantoprazole (PROTONIX) 40 MG tablet Take 40 mg by mouth daily.     . polyethylene glycol (MIRALAX / GLYCOLAX) packet Take 17 g by mouth daily. 14 each 0  . potassium chloride SA (K-DUR,KLOR-CON) 20 MEQ tablet Take 1 tablet (20 mEq total) by mouth daily. 30 tablet 0  . pyridOXINE (VITAMIN B-6) 100 MG tablet Take 100 mg by mouth daily.    Marland Kitchen senna (SENOKOT) 8.6 MG TABS tablet Take 1 tablet (8.6 mg total) by mouth 2 (two) times daily. 120 each 0  . sucralfate (CARAFATE) 1 GM/10ML suspension Take 10 mLs (1 g total) by mouth 4 (four) times daily -  with meals and at bedtime. (Patient taking differently: Take 1 g by  mouth 3 (three) times daily as needed (throat pain). ) 420 mL 1  . vitamin C (ASCORBIC ACID) 500 MG tablet Take 500 mg by mouth daily.     No current facility-administered medications for this visit.   Facility-Administered Medications Ordered in Other Visits  Medication Dose Route Frequency Provider Last Rate Last Dose  . morphine 4 MG/ML injection 2 mg  2 mg Intravenous Q30 min PRN Susanne Borders, NP   2 mg at 11/23/14 1345    REVIEW OF SYSTEMS:  Constitutional: positive for anorexia, fatigue and weight loss Eyes: negative Ears, nose, mouth, throat, and face: negative Respiratory: positive for dyspnea on exertion Cardiovascular: negative Gastrointestinal: positive for diarrhea Genitourinary:negative Integument/breast: negative  Hematologic/lymphatic: negative Musculoskeletal:positive for back pain and bone pain Neurological: negative Behavioral/Psych: positive for sleep disturbance Endocrine: negative Allergic/Immunologic: negative   PHYSICAL EXAMINATION: General appearance: alert, cooperative and no distress Head: Normocephalic, without obvious abnormality, atraumatic Neck: no adenopathy, no JVD, supple, symmetrical, trachea midline and thyroid not enlarged, symmetric, no tenderness/mass/nodules Lymph nodes: Cervical, supraclavicular, and axillary nodes normal. Resp: clear to auscultation bilaterally Back: symmetric, no curvature. ROM normal. No CVA tenderness. Cardio: regular rate and rhythm, S1, S2 normal, no murmur, click, rub or gallop GI: soft, non-tender; bowel sounds normal; no masses,  no organomegaly Extremities: edema Trace pitting edema right lower extremity, 1+ pitting edema left lower extremity Neurologic: Grossly normal There is point tenderness along the right clavicle and left anterior lateral rib cage  ECOG PERFORMANCE STATUS: 2 - Symptomatic, <50% confined to bed  Blood pressure 96/61, pulse 108, temperature 98.3 F (36.8 C), temperature source Oral, resp.  rate 18, height 6' (1.829 m), weight 176 lb 6.4 oz (80.015 kg), SpO2 93 %.  LABORATORY DATA: Lab Results  Component Value Date   WBC 5.1 02/10/2015   HGB 8.0* 02/10/2015   HCT 24.4* 02/10/2015   MCV 94.6 02/10/2015   PLT 187 02/10/2015      Chemistry      Component Value Date/Time   NA 140 02/10/2015 0843   NA 133* 01/18/2015 0243   K 3.7 02/10/2015 0843   K 3.6 01/18/2015 0243   CL 98* 01/18/2015 0243   CO2 32* 02/10/2015 0843   CO2 28 01/18/2015 0243   BUN 10.1 02/10/2015 0843   BUN 6 01/18/2015 0243   CREATININE 0.6* 02/10/2015 0843   CREATININE <0.30* 01/18/2015 0243      Component Value Date/Time   CALCIUM 10.5* 02/10/2015 0843   CALCIUM 7.6* 01/18/2015 0243   ALKPHOS 215* 02/10/2015 0843   ALKPHOS 151* 12/09/2014 1027   AST 43* 02/10/2015 0843   AST 54* 12/09/2014 1027   ALT 23 02/10/2015 0843   ALT 46 12/09/2014 1027   BILITOT 0.66 02/10/2015 0843   BILITOT 0.3 12/09/2014 1027       RADIOGRAPHIC STUDIES: Ct Chest W Contrast  01/17/2015   CLINICAL DATA:  Left-sided small cell lung cancer restaging  EXAM: CT CHEST, ABDOMEN, AND PELVIS WITH CONTRAST  TECHNIQUE: Multidetector CT imaging of the chest, abdomen and pelvis was performed following the standard protocol during bolus administration of intravenous contrast.  CONTRAST:  192m OMNIPAQUE IOHEXOL 300 MG/ML  SOLN  COMPARISON:  Multiple exams, including 11/16/2014  FINDINGS: CT CHEST FINDINGS  Mediastinum/Nodes: Distal esophageal wall thickening, measuring 9 mm in single wall thickness on image 49 series 2. Currently no pathologic thoracic adenopathy.  Extensive coronary artery atherosclerotic calcification. Proximal branch vessel atherosclerotic calcification noted.  Lungs/Pleura: New 0.9 by cm nodule medially in the right lower lobe, image 38 series 4.  Left lower lobe nodule on image 40 of series 4 measures 1.7 by 1.3 cm, formerly 2.0 by 1.5 cm by my measurements. There is an indistinct bandlike density measuring  1.8 by 1.2 by 0.7 cm in the left lower lobe on image 32 series 4, new compared to the prior exam.  Musculoskeletal: Extensive metastatic disease involving the proximal humerus bilaterally, left medial clavicle, left scapular spine and glenoid, thoracic spine, and numerous ribs noted. Some of the previously acute fractured ribs now demonstrate periosteal reaction indicating healing response ; there are new acute fractures of the left anterior first rib and right lateral sixth rib which are thought to  be pathologic. Extensive thoracic spine metastatic disease looks similar to the prior exam with multiple pathologic fractures similar to prior, and better depicted on the thoracic spine MRI exams.  CT ABDOMEN PELVIS FINDINGS  Hepatobiliary: Scattered hepatic metastatic lesions are generally reduced in size compared to the prior exam. For example, a 2.1 by 1.6 cm metastatic lesion in the right hepatic lobe on image 64 of series 2 of the 11/16/2014 exam currently measures 1.1 by 0.9 cm. No definite new hepatic metastatic lesions. Multiple gallstones are noted, measuring up to 1.5 cm in diameter. No biliary dilatation.  Pancreas: The previous low-density lesion in the tip of the tail the pancreas is no longer visible. In general the pancreas is considerably smaller, query accelerated pancreatic atrophy.  Spleen: Unremarkable  Adrenals/Urinary Tract: Anterior wall thickening in the urinary bladder, unchanged.  Stomach/Bowel: Equivocal distal rectal wall thickening, not present previously.  Vascular/Lymphatic: Prior porta hepatis and peripancreatic adenopathy is considerably improved. An index 1.7 cm portacaval node currently measures 0.9 cm in short axis. Aortoiliac atherosclerotic vascular disease.  Reproductive: Unremarkable  Other: No supplemental non-categorized findings.  Musculoskeletal: Severe osseous metastatic disease throughout the lumbar spine and bony pelvis, especially confluent in the right iliac bone and in  the sacral ala. There is continued tumor in the anterior presacral space in danger of involving the sacral plexus, although the amount of presacral tumor appears slightly less than previous. No significant progressive collapse compared to prior. Lumbar spondylosis and degenerative disc disease cause foraminal impingement at L4-5 and L5-S1.  IMPRESSION: 1. The dominant left lower lobe lung nodule and the hepatic tumor is improved, and the left anterior presacral tumor extension from the sacrum also appears slightly less, suggesting generalized improvement. Also, the pancreatic tail lesion is no longer readily visible, and the pancreas appears somewhat more atrophic than on the prior exam. 2. The bony lesions are mostly stable, although there several new pathologic rib fractures as detailed above. The extent of osseous metastatic burden is high. New flat nodularity in both lower lobes could well be inflammatory rather than representing new neoplastic pulmonary nodules, but merits attention on followup studies. 3. Stable equivocal anterior E urinary bladder wall thickening, similar to prior. 4. Other findings include gist esophageal wall thickening (probably from esophagitis), atherosclerosis ; cholelithiasis ; equivocal distal rectal wall thickening ; and lower lumbar spondylosis and degenerative disc disease causing foraminal impingement at L4-5 and L5-S1.   Electronically Signed   By: Van Clines M.D.   On: 01/17/2015 16:20   Ct Abdomen Pelvis W Contrast  01/17/2015   CLINICAL DATA:  Left-sided small cell lung cancer restaging  EXAM: CT CHEST, ABDOMEN, AND PELVIS WITH CONTRAST  TECHNIQUE: Multidetector CT imaging of the chest, abdomen and pelvis was performed following the standard protocol during bolus administration of intravenous contrast.  CONTRAST:  121m OMNIPAQUE IOHEXOL 300 MG/ML  SOLN  COMPARISON:  Multiple exams, including 11/16/2014  FINDINGS: CT CHEST FINDINGS  Mediastinum/Nodes: Distal  esophageal wall thickening, measuring 9 mm in single wall thickness on image 49 series 2. Currently no pathologic thoracic adenopathy.  Extensive coronary artery atherosclerotic calcification. Proximal branch vessel atherosclerotic calcification noted.  Lungs/Pleura: New 0.9 by cm nodule medially in the right lower lobe, image 38 series 4.  Left lower lobe nodule on image 40 of series 4 measures 1.7 by 1.3 cm, formerly 2.0 by 1.5 cm by my measurements. There is an indistinct bandlike density measuring 1.8 by 1.2 by 0.7 cm in the left lower lobe  on image 32 series 4, new compared to the prior exam.  Musculoskeletal: Extensive metastatic disease involving the proximal humerus bilaterally, left medial clavicle, left scapular spine and glenoid, thoracic spine, and numerous ribs noted. Some of the previously acute fractured ribs now demonstrate periosteal reaction indicating healing response ; there are new acute fractures of the left anterior first rib and right lateral sixth rib which are thought to be pathologic. Extensive thoracic spine metastatic disease looks similar to the prior exam with multiple pathologic fractures similar to prior, and better depicted on the thoracic spine MRI exams.  CT ABDOMEN PELVIS FINDINGS  Hepatobiliary: Scattered hepatic metastatic lesions are generally reduced in size compared to the prior exam. For example, a 2.1 by 1.6 cm metastatic lesion in the right hepatic lobe on image 64 of series 2 of the 11/16/2014 exam currently measures 1.1 by 0.9 cm. No definite new hepatic metastatic lesions. Multiple gallstones are noted, measuring up to 1.5 cm in diameter. No biliary dilatation.  Pancreas: The previous low-density lesion in the tip of the tail the pancreas is no longer visible. In general the pancreas is considerably smaller, query accelerated pancreatic atrophy.  Spleen: Unremarkable  Adrenals/Urinary Tract: Anterior wall thickening in the urinary bladder, unchanged.  Stomach/Bowel:  Equivocal distal rectal wall thickening, not present previously.  Vascular/Lymphatic: Prior porta hepatis and peripancreatic adenopathy is considerably improved. An index 1.7 cm portacaval node currently measures 0.9 cm in short axis. Aortoiliac atherosclerotic vascular disease.  Reproductive: Unremarkable  Other: No supplemental non-categorized findings.  Musculoskeletal: Severe osseous metastatic disease throughout the lumbar spine and bony pelvis, especially confluent in the right iliac bone and in the sacral ala. There is continued tumor in the anterior presacral space in danger of involving the sacral plexus, although the amount of presacral tumor appears slightly less than previous. No significant progressive collapse compared to prior. Lumbar spondylosis and degenerative disc disease cause foraminal impingement at L4-5 and L5-S1.  IMPRESSION: 1. The dominant left lower lobe lung nodule and the hepatic tumor is improved, and the left anterior presacral tumor extension from the sacrum also appears slightly less, suggesting generalized improvement. Also, the pancreatic tail lesion is no longer readily visible, and the pancreas appears somewhat more atrophic than on the prior exam. 2. The bony lesions are mostly stable, although there several new pathologic rib fractures as detailed above. The extent of osseous metastatic burden is high. New flat nodularity in both lower lobes could well be inflammatory rather than representing new neoplastic pulmonary nodules, but merits attention on followup studies. 3. Stable equivocal anterior E urinary bladder wall thickening, similar to prior. 4. Other findings include gist esophageal wall thickening (probably from esophagitis), atherosclerosis ; cholelithiasis ; equivocal distal rectal wall thickening ; and lower lumbar spondylosis and degenerative disc disease causing foraminal impingement at L4-5 and L5-S1.   Electronically Signed   By: Van Clines M.D.   On:  01/17/2015 16:20    ASSESSMENT AND PLAN: this is a very pleasant 54 years old white male recently diagnosed with extensive stage small cell lung cancer status post 6 cycles of systemic chemotherapy with carboplatin and etoposide with partial response. This was followed by prophylactic cranial irradiation in addition to palliative radiotherapy to the T7 thoracic spine and the chest. The CT scan of the Chest, Abdomen and pelvis showed evidence for further disease progression with progressive osseous metastasis and tumor canal encroachment involving the thoracic spine as well as development of hepatic metastasis and nodal metastasis within the  upper abdomen. He completed palliative radiation to the upper and lower thoracic spine on 12/13/2014. He is currently being treated with systemic chemotherapy with cisplatin and irinotecan on days 1 and 8 every 3 weeks. He is status post 2 cycles.  He is tolerating his treatment well with the exception of diarrhea.  The recent restaging CT scan revealed improvement in his disease. He presents to start day 1 of cycle #3 today. We will proceed with chemotherapy today as scheduled. His hemoglobin is 8.0 g/dL today. This will likely be driven lower with this cycle of chemotherapy and I will arrange to transfuse him a total of 2 units of packed red blood cells on 02/11/2015. Patient is in agreement with this plan. For pain management I will increase his OxyContin to 60 mg by mouth every 8 hours and he is given a prescription to reflect this increased dose, a total of 90 tablets with no refill. He will follow-up in 3 weeks as previously scheduled to see Dr. Julien Nordmann with a restaging CT scan of the chest, abdomen and pelvis to reevaluate his disease. Cycle #4 is a hearty scheduled for 03/03/2015.   The patient was advised to call immediately if he has any concerning symptoms in the interval. The patient voices understanding of current disease status and treatment options and is  in agreement with the current care plan.  Patient was reviewed with Dr. Julien Nordmann.  All questions were answered. The patient knows to call the clinic with any problems, questions or concerns. We can certainly see the patient much sooner if necessary.  Carlton Adam, PA-C 02/10/2015

## 2015-02-11 ENCOUNTER — Ambulatory Visit (HOSPITAL_BASED_OUTPATIENT_CLINIC_OR_DEPARTMENT_OTHER): Payer: Federal, State, Local not specified - PPO

## 2015-02-11 VITALS — BP 119/61 | HR 91 | Temp 98.2°F | Resp 18

## 2015-02-11 DIAGNOSIS — T451X5A Adverse effect of antineoplastic and immunosuppressive drugs, initial encounter: Principal | ICD-10-CM

## 2015-02-11 DIAGNOSIS — D6481 Anemia due to antineoplastic chemotherapy: Secondary | ICD-10-CM | POA: Diagnosis not present

## 2015-02-11 LAB — PREPARE RBC (CROSSMATCH)

## 2015-02-11 MED ORDER — ACETAMINOPHEN 325 MG PO TABS
ORAL_TABLET | ORAL | Status: AC
  Filled 2015-02-11: qty 2

## 2015-02-11 MED ORDER — DIPHENHYDRAMINE HCL 25 MG PO CAPS
25.0000 mg | ORAL_CAPSULE | Freq: Once | ORAL | Status: AC
  Administered 2015-02-11: 25 mg via ORAL

## 2015-02-11 MED ORDER — DIPHENHYDRAMINE HCL 25 MG PO CAPS
ORAL_CAPSULE | ORAL | Status: AC
  Filled 2015-02-11: qty 1

## 2015-02-11 MED ORDER — ACETAMINOPHEN 325 MG PO TABS
650.0000 mg | ORAL_TABLET | Freq: Once | ORAL | Status: AC
  Administered 2015-02-11: 650 mg via ORAL

## 2015-02-11 MED ORDER — SODIUM CHLORIDE 0.9 % IJ SOLN
10.0000 mL | INTRAMUSCULAR | Status: DC | PRN
  Filled 2015-02-11: qty 10

## 2015-02-11 NOTE — Patient Instructions (Signed)

## 2015-02-12 LAB — TYPE AND SCREEN
ABO/RH(D): O POS
ANTIBODY SCREEN: NEGATIVE
Unit division: 0
Unit division: 0

## 2015-02-13 ENCOUNTER — Encounter (HOSPITAL_COMMUNITY): Payer: Self-pay

## 2015-02-13 ENCOUNTER — Telehealth: Payer: Self-pay | Admitting: Internal Medicine

## 2015-02-13 ENCOUNTER — Other Ambulatory Visit: Payer: Self-pay

## 2015-02-13 ENCOUNTER — Emergency Department (HOSPITAL_COMMUNITY): Payer: Federal, State, Local not specified - PPO

## 2015-02-13 ENCOUNTER — Emergency Department (HOSPITAL_COMMUNITY)
Admission: EM | Admit: 2015-02-13 | Discharge: 2015-02-14 | Disposition: A | Discharge status: Home / Self Care | Payer: Federal, State, Local not specified - PPO | Attending: Emergency Medicine | Admitting: Emergency Medicine

## 2015-02-13 ENCOUNTER — Telehealth: Payer: Self-pay | Admitting: *Deleted

## 2015-02-13 DIAGNOSIS — J189 Pneumonia, unspecified organism: Secondary | ICD-10-CM

## 2015-02-13 DIAGNOSIS — Z85830 Personal history of malignant neoplasm of bone: Secondary | ICD-10-CM | POA: Diagnosis not present

## 2015-02-13 DIAGNOSIS — Z79899 Other long term (current) drug therapy: Secondary | ICD-10-CM | POA: Insufficient documentation

## 2015-02-13 DIAGNOSIS — J159 Unspecified bacterial pneumonia: Secondary | ICD-10-CM | POA: Diagnosis not present

## 2015-02-13 DIAGNOSIS — Z87891 Personal history of nicotine dependence: Secondary | ICD-10-CM | POA: Insufficient documentation

## 2015-02-13 DIAGNOSIS — Z794 Long term (current) use of insulin: Secondary | ICD-10-CM | POA: Insufficient documentation

## 2015-02-13 DIAGNOSIS — I10 Essential (primary) hypertension: Secondary | ICD-10-CM | POA: Insufficient documentation

## 2015-02-13 DIAGNOSIS — Z8505 Personal history of malignant neoplasm of liver: Secondary | ICD-10-CM | POA: Diagnosis not present

## 2015-02-13 DIAGNOSIS — R110 Nausea: Secondary | ICD-10-CM | POA: Diagnosis not present

## 2015-02-13 DIAGNOSIS — R079 Chest pain, unspecified: Secondary | ICD-10-CM | POA: Diagnosis present

## 2015-02-13 DIAGNOSIS — E119 Type 2 diabetes mellitus without complications: Secondary | ICD-10-CM | POA: Diagnosis not present

## 2015-02-13 DIAGNOSIS — Z85118 Personal history of other malignant neoplasm of bronchus and lung: Secondary | ICD-10-CM | POA: Diagnosis not present

## 2015-02-13 LAB — COMPREHENSIVE METABOLIC PANEL
ALT: 24 U/L (ref 17–63)
AST: 56 U/L — ABNORMAL HIGH (ref 15–41)
Albumin: 2.7 g/dL — ABNORMAL LOW (ref 3.5–5.0)
Alkaline Phosphatase: 201 U/L — ABNORMAL HIGH (ref 38–126)
Anion gap: 11 (ref 5–15)
BUN: 18 mg/dL (ref 6–20)
CALCIUM: 7.8 mg/dL — AB (ref 8.9–10.3)
CO2: 27 mmol/L (ref 22–32)
Chloride: 102 mmol/L (ref 101–111)
Creatinine, Ser: 0.51 mg/dL — ABNORMAL LOW (ref 0.61–1.24)
GFR calc Af Amer: >60 mL/min (ref >60)
GFR calc non Af Amer: >60 mL/min (ref >60)
Glucose, Bld: 93 mg/dL (ref 65–99)
POTASSIUM: 3.6 mmol/L (ref 3.5–5.1)
SODIUM: 140 mmol/L (ref 135–145)
Total Bilirubin: 0.7 mg/dL (ref 0.3–1.2)
Total Protein: 6.1 g/dL — ABNORMAL LOW (ref 6.5–8.1)

## 2015-02-13 LAB — CBC WITH DIFFERENTIAL/PLATELET
Basophils Absolute: 0 10*3/uL (ref 0.0–0.1)
Basophils Relative: 0 % (ref 0–1)
Eosinophils Absolute: 1 10*3/uL — ABNORMAL HIGH (ref 0.0–0.7)
Eosinophils Relative: 16 % — ABNORMAL HIGH (ref 0–5)
HCT: 30 % — ABNORMAL LOW (ref 39.0–52.0)
Hemoglobin: 9.9 g/dL — ABNORMAL LOW (ref 13.0–17.0)
LYMPHS ABS: 0.6 10*3/uL — AB (ref 0.7–4.0)
Lymphocytes Relative: 10 % — ABNORMAL LOW (ref 12–46)
MCH: 30.7 pg (ref 26.0–34.0)
MCHC: 33 g/dL (ref 30.0–36.0)
MCV: 93.2 fL (ref 78.0–100.0)
MONOS PCT: 6 % (ref 3–12)
Monocytes Absolute: 0.4 10*3/uL (ref 0.1–1.0)
NEUTROS PCT: 68 % (ref 43–77)
Neutro Abs: 4.4 10*3/uL (ref 1.7–7.7)
PLATELETS: 165 10*3/uL (ref 150–400)
RBC: 3.22 MIL/uL — ABNORMAL LOW (ref 4.22–5.81)
RDW: 19 % — ABNORMAL HIGH (ref 11.5–15.5)
WBC: 6.3 10*3/uL (ref 4.0–10.5)

## 2015-02-13 LAB — LIPASE, BLOOD: Lipase: 16 U/L — ABNORMAL LOW (ref 22–51)

## 2015-02-13 LAB — I-STAT TROPONIN, ED: Troponin i, poc: 0.01 ng/mL (ref 0.00–0.08)

## 2015-02-13 LAB — MAGNESIUM: Magnesium: 1.4 mg/dL — ABNORMAL LOW (ref 1.7–2.4)

## 2015-02-13 LAB — I-STAT CG4 LACTIC ACID, ED: LACTIC ACID, VENOUS: 1.62 mmol/L (ref 0.5–2.0)

## 2015-02-13 MED ORDER — SODIUM CHLORIDE 0.9 % IV BOLUS (SEPSIS)
1000.0000 mL | Freq: Once | INTRAVENOUS | Status: AC
  Administered 2015-02-13: 1000 mL via INTRAVENOUS

## 2015-02-13 MED ORDER — ONDANSETRON 4 MG PO TBDP
4.0000 mg | ORAL_TABLET | Freq: Once | ORAL | Status: AC
  Administered 2015-02-13: 4 mg via ORAL
  Filled 2015-02-13: qty 1

## 2015-02-13 MED ORDER — IOHEXOL 350 MG/ML SOLN
100.0000 mL | Freq: Once | INTRAVENOUS | Status: AC | PRN
  Administered 2015-02-13: 100 mL via INTRAVENOUS

## 2015-02-13 MED ORDER — ACETAMINOPHEN 325 MG PO TABS
650.0000 mg | ORAL_TABLET | Freq: Once | ORAL | Status: AC
  Administered 2015-02-13: 650 mg via ORAL
  Filled 2015-02-13: qty 2

## 2015-02-13 MED ORDER — LEVOFLOXACIN IN D5W 750 MG/150ML IV SOLN
750.0000 mg | INTRAVENOUS | Status: DC
  Administered 2015-02-14: 750 mg via INTRAVENOUS
  Filled 2015-02-13: qty 150

## 2015-02-13 NOTE — Telephone Encounter (Signed)
returned call and s.w. pt and confirmed all appts

## 2015-02-13 NOTE — Telephone Encounter (Signed)
Received phone call from patient asking if he needs to be taking lasix.  Per Awilda Metro, PA, patient needs to only take lasix if having swelling.  Informed patient of information.  Patient verbalized understanding.

## 2015-02-13 NOTE — ED Notes (Signed)
Pt complains of chest pain and shortness of breath, he had chemo on Friday and a blood transfusion on Saturday, he was ok Sunday but this am woke up short of breath and having chest pain

## 2015-02-13 NOTE — ED Provider Notes (Signed)
CSN: [id number]     Arrival date & time 02/13/15  2123 History   First MD Initiated Contact with Patient 02/13/15 2133     Chief Complaint  Patient presents with  . Chest Pain     (Consider location/radiation/quality/duration/timing/severity/associated sxs/prior Treatment) Patient is a 54 y.o. male presenting with chest pain. The history is provided by the patient. No language interpreter was used.  Chest Pain Associated symptoms: fatigue, nausea and shortness of breath   Associated symptoms: no cough, no fever and not vomiting    Mr. Ostermiller is a 54 year old male with a history of diabetes, hypertension, lung and liver cancer that is metastatic to the bone who presents for chest pain, shortness of breath and fatigue. He states he had his last chemo on Friday and had a blood transfusion on Saturday.  He states he went to [place] with his wife and may have over done it.  His wife states he has had chest pain for at least 2 days that is intermittent but worse with walking or talking. He states he is unable to take a deep breath without pain. He tried to lay down and relax which usually helps but did not work today. Patient denies any fever, vomiting.  Oncologist: Lorna Few  Past Medical History  Diagnosis Date  . Diabetes mellitus without complication   . Hypertension   . Smoking addiction   . Cancer     lung and liver  . Radiation 08/16/14-09/06/14    prophylatic cranial radiation 25 gray, chest, t-spine  35 gray  . Radiation 09/29/14-10/12/14    left hilar area 30 gray, right pelvis 30 gray, right lower rib cage area 30 gray  . 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 Lung cancer   . Liver cancer    Past Surgical History  Procedure Laterality Date  . Arm surgery     Family History  Problem Relation Age of Onset  . Pancreatic cancer Mother    History  Substance Use Topics  . Smoking status: Former Smoker -- 0.75 packs/day for 30 years    Types: Cigarettes    Quit  date: 06/03/2014  . Smokeless tobacco: Never Used  . Alcohol Use: 18.0 oz/week    30 Cans of beer per week     Comment: 6 beers per day x 5 days per week    Review of Systems  Constitutional: Positive for chills and fatigue. Negative for fever.  Respiratory: Positive for shortness of breath. Negative for cough.   Cardiovascular: Positive for chest pain. Negative for leg swelling.  Gastrointestinal: Positive for nausea. Negative for vomiting.  Neurological: Negative for syncope.  All other systems reviewed and are negative.     Allergies  Review of patient's allergies indicates no known allergies.  Home Medications   Prior to Admission medications   Medication Sig Start Date End Date Taking? Authorizing Provider  albuterol (PROVENTIL HFA;VENTOLIN HFA) 108 (90 BASE) MCG/ACT inhaler Inhale 2 puffs into the lungs every 6 (six) hours as needed for wheezing. 08/09/12  Yes Leandrew Koyanagi, MD  alum & mag hydroxide-simeth (MAALOX/MYLANTA) 200-200-20 MG/5ML suspension Take 30 mLs by mouth every 6 (six) hours as needed for indigestion or heartburn (indigestion).   Yes Historical Provider, MD  atorvastatin (LIPITOR) 40 MG tablet Take 40 mg by mouth daily.   Yes Historical Provider, MD  benzocaine (ORAJEL) 10 % mucosal gel Use as directed 1 application in the mouth or throat 3 (three) times daily as needed for mouth  pain (mouth pain).   Yes Historical Provider, MD  bismuth subsalicylate (PEPTO BISMOL) 262 MG/15ML suspension Take 30 mLs by mouth every 6 (six) hours as needed for indigestion (indigestion).   Yes Historical Provider, MD  calcium-vitamin D (OSCAL WITH D) 500-200 MG-UNIT per tablet Take 1 tablet by mouth daily with breakfast.    Yes Historical Provider, MD  emollient (BIAFINE) cream Apply topically as needed. Patient taking differently: Apply 1 application topically as needed. For radiation itch. 08/16/14  Yes Gery Pray, MD  Fluticasone-Salmeterol (ADVAIR) 250-50 MCG/DOSE AEPB  Inhale 2 puffs into the lungs 2 (two) times daily as needed (wheezing).    Yes Historical Provider, MD  Insulin Pen Needle (PEN NEEDLES 3/16") 31G X 5 MM MISC Use daily with your Lantus injection 11/29/14  Yes Silver Huguenin Elgergawy, MD  magnesium oxide (MAGNESIUM-OXIDE) 400 (241.3 MG) MG tablet Take 1 tablet (400 mg total) by mouth 3 (three) times daily. Patient taking differently: Take 1 tablet by mouth 6 (six) times daily.  01/27/15  Yes Adrena E Johnson, PA-C  metFORMIN (GLUCOPHAGE) 1000 MG tablet Take 1,000 mg by mouth 2 (two) times daily with a meal.   Yes Historical Provider, MD  Multiple Vitamins-Minerals (CENTRUM SILVER ADULT 50+ PO) Take 1 tablet by mouth daily.    Yes Historical Provider, MD  ondansetron (ZOFRAN) 8 MG tablet Take 8 mg by mouth every 8 (eight) hours as needed for nausea or vomiting (nause and vomiting). Disp. 20 tablets. 1 refill.  Called in to [facility] per Dr. Sondra Come on 11/24/14.   Yes Historical Provider, MD  ONE TOUCH ULTRA TEST test strip 1 each by Other route 3 (three) times daily.  04/11/14  Yes Historical Provider, MD  Oxycodone HCl 10 MG TABS Take 1-2 tablets (10-20 mg total) by mouth every 4 (four) hours as needed. 02/03/15  Yes Carlton Adam, PA-C  OxyCODONE HCl ER 60 MG T12A Take one tablet (60 mg) by mouth every 8 hours 02/10/15  Yes Adrena E Johnson, PA-C  pantoprazole (PROTONIX) 40 MG tablet Take 40 mg by mouth daily.  05/19/14  Yes Historical Provider, MD  polyethylene glycol (MIRALAX / GLYCOLAX) packet Take 17 g by mouth daily. 11/29/14  Yes Albertine Patricia, MD  pyridOXINE (VITAMIN B-6) 100 MG tablet Take 100 mg by mouth daily.   Yes Historical Provider, MD  senna (SENOKOT) 8.6 MG TABS tablet Take 1 tablet (8.6 mg total) by mouth 2 (two) times daily. 02/03/15  Yes Adrena E Johnson, PA-C  sucralfate (CARAFATE) 1 GM/10ML suspension Take 10 mLs (1 g total) by mouth 4 (four) times daily -  with meals and at bedtime. Patient taking differently: Take 1 g by mouth 3 (three)  times daily as needed (throat pain).  12/27/14  Yes Thea Silversmith, MD  vitamin C (ASCORBIC ACID) 500 MG tablet Take 500 mg by mouth daily.   Yes Historical Provider, MD  cephALEXin (KEFLEX) 500 MG capsule Take 1 capsule (500 mg total) by mouth 3 (three) times daily. Patient not taking: Reported on 02/13/2015 01/20/15   Carlton Adam, PA-C  furosemide (LASIX) 20 MG tablet Take 2 tablets by mouth daily or as instructed 01/06/15   Carlton Adam, PA-C  Insulin Glargine (LANTUS) 100 UNIT/ML Solostar Pen Inject 15 Units into the skin daily. 11/29/14   Silver Huguenin Elgergawy, MD  levofloxacin (LEVAQUIN) 500 MG tablet Take 1 tablet (500 mg total) by mouth daily. 02/14/15   Benino Korinek Patel-Mills, PA-C  potassium chloride SA (K-DUR,KLOR-CON) 20  MEQ tablet Take 1 tablet (20 mEq total) by mouth daily. Patient not taking: Reported on 02/13/2015 01/06/15   Adrena E Johnson, PA-C   BP 107/61 mmHg  Pulse 85  Temp(Src) 99.3 F (37.4 C) (Rectal)  Resp 14  Ht '6\' 2"'$  (1.88 m)  Wt 178 lb (80.74 kg)  BMI 22.84 kg/m2  SpO2 92% Physical Exam  Constitutional: He is oriented to person, place, and time. He appears well-developed and well-nourished.  Patient dry heaving at bedside.  HENT:  Head: Normocephalic and atraumatic.  Eyes: Conjunctivae are normal.  Neck: Normal range of motion. Neck supple.  Cardiovascular: Normal rate, regular rhythm and normal heart sounds.   Pulmonary/Chest: Effort normal.  Abdominal: Soft. There is no tenderness.  Musculoskeletal: Normal range of motion.  Neurological: He is alert and oriented to person, place, and time.  Skin: Skin is warm and dry. There is pallor.  Nursing note and vitals reviewed.   ED Course  Procedures (including critical care time) Labs Review Labs Reviewed  CBC WITH DIFFERENTIAL/PLATELET - Abnormal; Notable for the following:    RBC 3.22 (*)    Hemoglobin 9.9 (*)    HCT 30.0 (*)    RDW 19.0 (*)    Lymphocytes Relative 10 (*)    Lymphs Abs 0.6 (*)     Eosinophils Relative 16 (*)    Eosinophils Absolute 1.0 (*)    All other components within normal limits  COMPREHENSIVE METABOLIC PANEL - Abnormal; Notable for the following:    Creatinine, Ser 0.51 (*)    Calcium 7.8 (*)    Total Protein 6.1 (*)    Albumin 2.7 (*)    AST 56 (*)    Alkaline Phosphatase 201 (*)    All other components within normal limits  LIPASE, BLOOD - Abnormal; Notable for the following:    Lipase 16 (*)    All other components within normal limits  MAGNESIUM - Abnormal; Notable for the following:    Magnesium 1.4 (*)    All other components within normal limits  CULTURE, BLOOD (ROUTINE X 2)  CULTURE, BLOOD (ROUTINE X 2)  I-STAT TROPOININ, ED  I-STAT CG4 LACTIC ACID, ED  I-STAT CG4 LACTIC ACID, ED    Imaging Review Dg Chest 2 View  02/13/2015   CLINICAL DATA:  Patient with chest pain and shortness of breath. Started chemotherapy on Friday.  EXAM: CHEST  2 VIEW  COMPARISON:  Chest radiograph 08/23/2014  FINDINGS: Monitoring leads overlie the patient. Stable cardiac and mediastinal contours. Known nodules within the bilateral lower lobes are not as well demonstrated on current radiograph. No pleural effusion or pneumothorax. Re- demonstrated multiple mid thoracic spine compression deformities. Diffuse osseous metastatic disease involving the thoracic spine, ribs and left scapula.  IMPRESSION: No acute cardiopulmonary process.  Known pulmonary nodules are not as well demonstrated on current radiograph.  Diffuse osseous metastatic disease with multiple wedge compression deformities of the thoracic spine.   Electronically Signed   By: Lovey Newcomer M.D.   On: 02/13/2015 22:24   Ct Angio Chest Pe W/cm &/or Wo Cm  02/13/2015   CLINICAL DATA:  Patient with history of known lung cancer. On chemotherapy. Chest pain.  EXAM: CT ANGIOGRAPHY CHEST WITH CONTRAST  TECHNIQUE: Multidetector CT imaging of the chest was performed using the standard protocol during bolus administration of  intravenous contrast. Multiplanar CT image reconstructions and MIPs were obtained to evaluate the vascular anatomy.  CONTRAST:  175m OMNIPAQUE IOHEXOL 350 MG/ML SOLN  COMPARISON:  CT 01/17/2015  FINDINGS: Mediastinum/Nodes: Visualized thyroid is unremarkable. No enlarged axillary, mediastinal or hilar lymphadenopathy. Normal heart size. No pericardial effusion. Coronary arterial vascular calcifications.  Adequate opacification of the main pulmonary artery. Motion artifact limits evaluation of the pulmonary arteries particularly within the left lower lobe. Within the above limitation no definite evidence for pulmonary embolism.  Lungs/Pleura: Central airways are patent. Interval development of peribronchovascular ground-glass nodularity involving the right upper and right lower lobes. The previously described left lower lobe nodules are stable including a 1.3 x 1.5 cm nodule (image 43; series 7), previously 1.8 x 1.2 cm and min additional 1.3 x 1.5 cm nodule (image 56; series 7), previously 1.7 x 1.3 cm. No pleural effusion or pneumothorax.  Upper abdomen: Re- demonstrated multiple low-attenuation lesions throughout the liver compatible with known hepatic metastatic disease.  Musculoskeletal: Re- demonstrated diffuse osseous metastatic disease involving the proximal humerus bilaterally, left clavicle, left scapula, thoracic spine and multiple ribs. Multiple healing rib fractures are demonstrated bilaterally. Re- demonstrated multiple thoracic spine compression deformities, grossly similar when compared to prior CT and MRI.  Review of the MIP images confirms the above findings.  IMPRESSION: 1. No definite evidence for pulmonary embolism. Evaluation limited within the left lower lobe due to motion artifact. 2. Interval development of peribronchovascular ground-glass nodularity predominately within the right upper and right lower lobes. Findings are nonspecific however may represent an acute infectious/inflammatory  process or potentially drug reaction. 3. Findings compatible with metastatic disease involving the lungs, liver and bones.   Electronically Signed   By: Lovey Newcomer M.D.   On: 02/13/2015 23:43     EKG Interpretation   Date/Time:  Monday February 13 2015 21:30:59 EDT Ventricular Rate:  99 PR Interval:  152 QRS Duration: 114 QT Interval:  367 QTC Calculation: 471 R Axis:   78 Text Interpretation:  Sinus rhythm Borderline intraventricular conduction  delay Baseline wander in lead(s) V1 V5 No significant change since last  tracing Confirmed by POLLINA  MD, CHRISTOPHER [phone]) on 02/13/2015  11:45:42 PM      MDM   Final diagnoses:  Healthcare-associated pneumonia  Patient presents for chest pain and shortness of breath with nausea. His pain is most likely due to his liver and lung cancer that has metastasized to his back. His labs are not concerning and he is not ill-appearing or in any acute distress. His lactate and troponin are normal. His EKG is not concerning. He is anemic but just had a transfusion on Saturday. He also had his last dose of chemotherapy on Friday. CT angio shows no PE but does show right upper and lower lobe infectious or inflammatory process. I have given him the first dose of Levaquin here in the ED. Dr. Betsey Holiday has seen and evaluated the patient and agrees that the patient can go home. His vitals are stable. He was given Tylenol. I will give the patient anabiotic's to go home with and he can follow up with his PCP. I gave the patient strict return precautions such as continued shortness of breath or chest pain or fever. Patient is still receiving IV anabiotic so I discussed this with Nehemiah Settle, PA-C so that she is aware that he can go home after this is finished.    Ottie Glazier, PA-C 02/14/15 0109  Orpah Greek, MD 02/20/15 [phone]

## 2015-02-13 NOTE — ED Provider Notes (Addendum)
Patient presented to the ER with chest pain and shortness of breath. Patient reports that symptoms were present upon awakening this morning. He has a pain in the chest and in his back that worsens with movement or taking a deep breath.  Face to face Exam: HEENT - PERRLA Lungs - CTAB Heart - RRR, no M/R/G Abd - S/NT/ND Neuro - alert, oriented x3  Plan: Patient has reproducible and positional pain in the back and chest which is likely musculoskeletal. Cardiac evaluation performed. Will perform CT angiography to rule out PE, occult pneumonia. Patient does have multiple vertebral fractures noted on x-ray, might have a pathologic fracture in his back that would explain his pain. No neurologic deficit noted.   EKG Interpretation  Date/Time:  Monday February 13 2015 21:30:59 EDT Ventricular Rate:  99 PR Interval:  152 QRS Duration: 114 QT Interval:  367 QTC Calculation: 471 R Axis:   78 Text Interpretation:  Sinus rhythm Borderline intraventricular conduction delay Baseline wander in lead(s) V1 V5 No significant change since last tracing Confirmed by Ourania Hamler  MD, Coreon Simkins [phone]) on 02/13/2015 11:45:42 PM        Orpah Greek, MD 02/13/15 2130  Orpah Greek, MD 02/13/15 [phone]

## 2015-02-14 LAB — I-STAT CG4 LACTIC ACID, ED: LACTIC ACID, VENOUS: 0.78 mmol/L (ref 0.5–2.0)

## 2015-02-14 MED ORDER — MORPHINE SULFATE 4 MG/ML IJ SOLN
4.0000 mg | Freq: Once | INTRAMUSCULAR | Status: AC
  Administered 2015-02-14: 4 mg via INTRAVENOUS
  Filled 2015-02-14: qty 1

## 2015-02-14 MED ORDER — LEVOFLOXACIN 500 MG PO TABS: 500.0000 mg | ORAL_TABLET | Freq: Every day | ORAL | Status: DC

## 2015-02-14 NOTE — Discharge Instructions (Signed)
Pneumonia, Adult Return for fever or no improvement within 24-48 hours. Follow-up with your primary care physician in the next 2 days. Take anabiotic says prescribed. Pneumonia is an infection of the lungs. It may be caused by a germ (virus or bacteria). Some types of pneumonia can spread easily from person to person. This can happen when you cough or sneeze. HOME CARE  Only take medicine as told by your doctor.  Take your medicine (antibiotics) as told. Finish it even if you start to feel better.  Do not smoke.  You may use a vaporizer or humidifier in your room. This can help loosen thick spit (mucus).  Sleep so you are almost sitting up (semi-upright). This helps reduce coughing.  Rest. A shot (vaccine) can help prevent pneumonia. Shots are often advised for:  People over 6 years old.  Patients on chemotherapy.  People with long-term (chronic) lung problems.  People with immune system problems. GET HELP RIGHT AWAY IF:   You are getting worse.  You cannot control your cough, and you are losing sleep.  You cough up blood.  Your pain gets worse, even with medicine.  You have a fever.  Any of your problems are getting worse, not better.  You have shortness of breath or chest pain. MAKE SURE YOU:   Understand these instructions.  Will watch your condition.  Will get help right away if you are not doing well or get worse. Document Released: 01/29/2008 Document Revised: 11/04/2011 Document Reviewed: 11/02/2010 [facility] Patient Information 2015 [place], [place]. This information is not intended to replace advice given to you by your health care provider. Make sure you discuss any questions you have with your health care provider.

## 2015-02-15 ENCOUNTER — Encounter (HOSPITAL_COMMUNITY): Payer: Self-pay | Admitting: *Deleted

## 2015-02-15 ENCOUNTER — Emergency Department (HOSPITAL_COMMUNITY)
Admission: EM | Admit: 2015-02-15 | Discharge: 2015-02-15 | Disposition: A | Discharge status: Home / Self Care | Payer: Federal, State, Local not specified - PPO | Attending: Emergency Medicine | Admitting: Emergency Medicine

## 2015-02-15 ENCOUNTER — Emergency Department (HOSPITAL_COMMUNITY): Payer: Federal, State, Local not specified - PPO

## 2015-02-15 ENCOUNTER — Other Ambulatory Visit: Payer: Self-pay

## 2015-02-15 DIAGNOSIS — R05 Cough: Secondary | ICD-10-CM | POA: Insufficient documentation

## 2015-02-15 DIAGNOSIS — I10 Essential (primary) hypertension: Secondary | ICD-10-CM | POA: Diagnosis not present

## 2015-02-15 DIAGNOSIS — Z87891 Personal history of nicotine dependence: Secondary | ICD-10-CM | POA: Insufficient documentation

## 2015-02-15 DIAGNOSIS — Z85118 Personal history of other malignant neoplasm of bronchus and lung: Secondary | ICD-10-CM | POA: Insufficient documentation

## 2015-02-15 DIAGNOSIS — E119 Type 2 diabetes mellitus without complications: Secondary | ICD-10-CM | POA: Diagnosis not present

## 2015-02-15 DIAGNOSIS — Z8505 Personal history of malignant neoplasm of liver: Secondary | ICD-10-CM | POA: Insufficient documentation

## 2015-02-15 DIAGNOSIS — Z923 Personal history of irradiation: Secondary | ICD-10-CM | POA: Insufficient documentation

## 2015-02-15 DIAGNOSIS — R1310 Dysphagia, unspecified: Secondary | ICD-10-CM

## 2015-02-15 DIAGNOSIS — Z7951 Long term (current) use of inhaled steroids: Secondary | ICD-10-CM | POA: Diagnosis not present

## 2015-02-15 DIAGNOSIS — R0789 Other chest pain: Secondary | ICD-10-CM | POA: Diagnosis not present

## 2015-02-15 DIAGNOSIS — D6481 Anemia due to antineoplastic chemotherapy: Secondary | ICD-10-CM

## 2015-02-15 DIAGNOSIS — Z79899 Other long term (current) drug therapy: Secondary | ICD-10-CM | POA: Diagnosis not present

## 2015-02-15 DIAGNOSIS — R509 Fever, unspecified: Secondary | ICD-10-CM | POA: Insufficient documentation

## 2015-02-15 DIAGNOSIS — Z85830 Personal history of malignant neoplasm of bone: Secondary | ICD-10-CM | POA: Diagnosis not present

## 2015-02-15 DIAGNOSIS — Z79891 Long term (current) use of opiate analgesic: Secondary | ICD-10-CM | POA: Insufficient documentation

## 2015-02-15 DIAGNOSIS — C7951 Secondary malignant neoplasm of bone: Secondary | ICD-10-CM

## 2015-02-15 DIAGNOSIS — T451X5A Adverse effect of antineoplastic and immunosuppressive drugs, initial encounter: Secondary | ICD-10-CM

## 2015-02-15 DIAGNOSIS — C3490 Malignant neoplasm of unspecified part of unspecified bronchus or lung: Secondary | ICD-10-CM

## 2015-02-15 DIAGNOSIS — R079 Chest pain, unspecified: Secondary | ICD-10-CM | POA: Diagnosis present

## 2015-02-15 DIAGNOSIS — Z794 Long term (current) use of insulin: Secondary | ICD-10-CM | POA: Insufficient documentation

## 2015-02-15 LAB — CBC
HCT: 26.7 % — ABNORMAL LOW (ref 39.0–52.0)
Hemoglobin: 9 g/dL — ABNORMAL LOW (ref 13.0–17.0)
MCH: 30.9 pg (ref 26.0–34.0)
MCHC: 33.7 g/dL (ref 30.0–36.0)
MCV: 91.8 fL (ref 78.0–100.0)
Platelets: 129 10*3/uL — ABNORMAL LOW (ref 150–400)
RBC: 2.91 MIL/uL — ABNORMAL LOW (ref 4.22–5.81)
RDW: 18.5 % — ABNORMAL HIGH (ref 11.5–15.5)
WBC: 3.9 10*3/uL — ABNORMAL LOW (ref 4.0–10.5)

## 2015-02-15 LAB — BASIC METABOLIC PANEL
Anion gap: 9 (ref 5–15)
BUN: 6 mg/dL (ref 6–20)
CO2: 26 mmol/L (ref 22–32)
Calcium: 7.8 mg/dL — ABNORMAL LOW (ref 8.9–10.3)
Chloride: 103 mmol/L (ref 101–111)
Creatinine, Ser: 0.53 mg/dL — ABNORMAL LOW (ref 0.61–1.24)
GFR calc Af Amer: >60 mL/min (ref >60)
GFR calc non Af Amer: >60 mL/min (ref >60)
Glucose, Bld: 160 mg/dL — ABNORMAL HIGH (ref 65–99)
Potassium: 3.7 mmol/L (ref 3.5–5.1)
Sodium: 138 mmol/L (ref 135–145)

## 2015-02-15 LAB — I-STAT CG4 LACTIC ACID, ED: Lactic Acid, Venous: 1.72 mmol/L (ref 0.5–2.0)

## 2015-02-15 LAB — I-STAT TROPONIN, ED: Troponin i, poc: 0.01 ng/mL (ref 0.00–0.08)

## 2015-02-15 MED ORDER — OXYCODONE HCL 10 MG PO TABS: 10.0000 mg | ORAL_TABLET | ORAL | Status: DC | PRN

## 2015-02-15 MED ORDER — HYDROMORPHONE HCL 1 MG/ML IJ SOLN
1.0000 mg | Freq: Once | INTRAMUSCULAR | Status: AC
  Administered 2015-02-15: 1 mg via INTRAVENOUS
  Filled 2015-02-15: qty 1

## 2015-02-15 MED ORDER — SODIUM CHLORIDE 0.9 % IV BOLUS (SEPSIS)
1000.0000 mL | Freq: Once | INTRAVENOUS | Status: AC
  Administered 2015-02-15: 1000 mL via INTRAVENOUS

## 2015-02-15 NOTE — ED Notes (Signed)
Pt reports L cp which started Monday and was seen here for same and was told he had PNA.  Pt reports pain is not getting any better.  Pt describes pain as tight and radiates to the middle of his back.

## 2015-02-15 NOTE — ED Provider Notes (Signed)
CSN: [id number]     Arrival date & time 02/15/15  2104 History   First MD Initiated Contact with Patient 02/15/15 2116     Chief Complaint  Patient presents with  . Chest Pain     (Consider location/radiation/quality/duration/timing/severity/associated sxs/prior Treatment) HPI  54 year old male presents with worsening left-sided chest pain. He started having chest and back pain 2 days ago and was seen in this emergency department and diagnosed with pneumonia. This was on a CT scan that ruled out pulmonary embolism but did show inflammatory aspects that could be considered pneumonia. Patient had a low-grade temperature on arrival 2 days ago but has not had a fever since. He has had some cough as well as inspiratory chest pain. When patient is at rest he has no chest pain but any movement or coughing causes severe pain. The patient chronically is on OxyContin and oxycodone. He is out of his immediate release oxycodone. Patient denies dyspnea and states he is able to get full breaths but it hurts. The patient denies a nausea or vomiting. Rates his pain as severe.  Past Medical History  Diagnosis Date  . Diabetes mellitus without complication   . Hypertension   . Smoking addiction   . Cancer     lung and liver  . Radiation 08/16/14-09/06/14    prophylatic cranial radiation 25 gray, chest, t-spine  35 gray  . Radiation 09/29/14-10/12/14    left hilar area 30 gray, right pelvis 30 gray, right lower rib cage area 30 gray  . 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 Lung cancer   . Liver cancer    Past Surgical History  Procedure Laterality Date  . Arm surgery     Family History  Problem Relation Age of Onset  . Pancreatic cancer Mother    History  Substance Use Topics  . Smoking status: Former Smoker -- 0.75 packs/day for 30 years    Types: Cigarettes    Quit date: 06/03/2014  . Smokeless tobacco: Never Used  . Alcohol Use: 18.0 oz/week    30 Cans of beer per week     Comment: 6  beers per day x 5 days per week    Review of Systems  Constitutional: Positive for fever.  Respiratory: Positive for cough. Negative for shortness of breath.   Cardiovascular: Positive for chest pain.  Gastrointestinal: Negative for vomiting and abdominal pain.  All other systems reviewed and are negative.     Allergies  Review of patient's allergies indicates no known allergies.  Home Medications   Prior to Admission medications   Medication Sig Start Date End Date Taking? Authorizing Provider  albuterol (PROVENTIL HFA;VENTOLIN HFA) 108 (90 BASE) MCG/ACT inhaler Inhale 2 puffs into the lungs every 6 (six) hours as needed for wheezing. 08/09/12   Leandrew Koyanagi, MD  alum & mag hydroxide-simeth (MAALOX/MYLANTA) 200-200-20 MG/5ML suspension Take 30 mLs by mouth every 6 (six) hours as needed for indigestion or heartburn (indigestion).    Historical Provider, MD  atorvastatin (LIPITOR) 40 MG tablet Take 40 mg by mouth daily.    Historical Provider, MD  benzocaine (ORAJEL) 10 % mucosal gel Use as directed 1 application in the mouth or throat 3 (three) times daily as needed for mouth pain (mouth pain).    Historical Provider, MD  bismuth subsalicylate (PEPTO BISMOL) 262 MG/15ML suspension Take 30 mLs by mouth every 6 (six) hours as needed for indigestion (indigestion).    Historical Provider, MD  calcium-vitamin D (OSCAL WITH D)  500-200 MG-UNIT per tablet Take 1 tablet by mouth daily with breakfast.     Historical Provider, MD  cephALEXin (KEFLEX) 500 MG capsule Take 1 capsule (500 mg total) by mouth 3 (three) times daily. Patient not taking: Reported on 02/13/2015 01/20/15   Carlton Adam, PA-C  emollient (BIAFINE) cream Apply topically as needed. Patient taking differently: Apply 1 application topically as needed. For radiation itch. 08/16/14   Gery Pray, MD  Fluticasone-Salmeterol (ADVAIR) 250-50 MCG/DOSE AEPB Inhale 2 puffs into the lungs 2 (two) times daily as needed (wheezing).      Historical Provider, MD  furosemide (LASIX) 20 MG tablet Take 2 tablets by mouth daily or as instructed 01/06/15   Adrena E Johnson, PA-C  Insulin Glargine (LANTUS) 100 UNIT/ML Solostar Pen Inject 15 Units into the skin daily. 11/29/14   Silver Huguenin Elgergawy, MD  Insulin Pen Needle (PEN NEEDLES 3/16") 31G X 5 MM MISC Use daily with your Lantus injection 11/29/14   Silver Huguenin Elgergawy, MD  levofloxacin (LEVAQUIN) 500 MG tablet Take 1 tablet (500 mg total) by mouth daily. 02/14/15   Hanna Patel-Mills, PA-C  magnesium oxide (MAGNESIUM-OXIDE) 400 (241.3 MG) MG tablet Take 1 tablet (400 mg total) by mouth 3 (three) times daily. Patient taking differently: Take 1 tablet by mouth 6 (six) times daily.  01/27/15   Carlton Adam, PA-C  metFORMIN (GLUCOPHAGE) 1000 MG tablet Take 1,000 mg by mouth 2 (two) times daily with a meal.    Historical Provider, MD  Multiple Vitamins-Minerals (CENTRUM SILVER ADULT 50+ PO) Take 1 tablet by mouth daily.     Historical Provider, MD  ondansetron (ZOFRAN) 8 MG tablet Take 8 mg by mouth every 8 (eight) hours as needed for nausea or vomiting (nause and vomiting). Disp. 20 tablets. 1 refill.  Called in to [facility] per Dr. Sondra Come on 11/24/14.    Historical Provider, MD  ONE TOUCH ULTRA TEST test strip 1 each by Other route 3 (three) times daily.  04/11/14   Historical Provider, MD  Oxycodone HCl 10 MG TABS Take 1-2 tablets (10-20 mg total) by mouth every 4 (four) hours as needed. 02/03/15   Carlton Adam, PA-C  OxyCODONE HCl ER 60 MG T12A Take one tablet (60 mg) by mouth every 8 hours 02/10/15   Adrena E Johnson, PA-C  pantoprazole (PROTONIX) 40 MG tablet Take 40 mg by mouth daily.  05/19/14   Historical Provider, MD  polyethylene glycol (MIRALAX / GLYCOLAX) packet Take 17 g by mouth daily. 11/29/14   Silver Huguenin Elgergawy, MD  potassium chloride SA (K-DUR,KLOR-CON) 20 MEQ tablet Take 1 tablet (20 mEq total) by mouth daily. Patient not taking: Reported on 02/13/2015 01/06/15   Carlton Adam, PA-C  pyridOXINE (VITAMIN B-6) 100 MG tablet Take 100 mg by mouth daily.    Historical Provider, MD  senna (SENOKOT) 8.6 MG TABS tablet Take 1 tablet (8.6 mg total) by mouth 2 (two) times daily. 02/03/15   Carlton Adam, PA-C  sucralfate (CARAFATE) 1 GM/10ML suspension Take 10 mLs (1 g total) by mouth 4 (four) times daily -  with meals and at bedtime. Patient taking differently: Take 1 g by mouth 3 (three) times daily as needed (throat pain).  12/27/14   Thea Silversmith, MD  vitamin C (ASCORBIC ACID) 500 MG tablet Take 500 mg by mouth daily.    Historical Provider, MD   BP 100/61 mmHg  Pulse 95  Resp 18 Physical Exam  Constitutional: He is oriented to  person, place, and time. He appears well-developed and well-nourished.  HENT:  Head: Normocephalic and atraumatic.  Right Ear: External ear normal.  Left Ear: External ear normal.  Nose: Nose normal.  Eyes: Right eye exhibits no discharge. Left eye exhibits no discharge.  Neck: Neck supple.  Cardiovascular: Normal rate, regular rhythm, normal heart sounds and intact distal pulses.   Pulmonary/Chest: Effort normal and breath sounds normal. He has no wheezes. He has no rales. He exhibits tenderness (Diffuse chest wall tenderness over left chest).  Abdominal: Soft. There is no tenderness.  Musculoskeletal: He exhibits no edema.  Neurological: He is alert and oriented to person, place, and time.  Skin: Skin is warm and dry.  Nursing note and vitals reviewed.   ED Course  Procedures (including critical care time) Labs Review Labs Reviewed  CBC - Abnormal; Notable for the following:    WBC 3.9 (*)    RBC 2.91 (*)    Hemoglobin 9.0 (*)    HCT 26.7 (*)    RDW 18.5 (*)    Platelets 129 (*)    All other components within normal limits  BASIC METABOLIC PANEL - Abnormal; Notable for the following:    Glucose, Bld 160 (*)    Creatinine, Ser 0.53 (*)    Calcium 7.8 (*)    All other components within normal limits  I-STAT  TROPOININ, ED  I-STAT CG4 LACTIC ACID, ED    Imaging Review Dg Chest 2 View  02/13/2015   CLINICAL DATA:  Patient with chest pain and shortness of breath. Started chemotherapy on Friday.  EXAM: CHEST  2 VIEW  COMPARISON:  Chest radiograph 08/23/2014  FINDINGS: Monitoring leads overlie the patient. Stable cardiac and mediastinal contours. Known nodules within the bilateral lower lobes are not as well demonstrated on current radiograph. No pleural effusion or pneumothorax. Re- demonstrated multiple mid thoracic spine compression deformities. Diffuse osseous metastatic disease involving the thoracic spine, ribs and left scapula.  IMPRESSION: No acute cardiopulmonary process.  Known pulmonary nodules are not as well demonstrated on current radiograph.  Diffuse osseous metastatic disease with multiple wedge compression deformities of the thoracic spine.   Electronically Signed   By: Lovey Newcomer M.D.   On: 02/13/2015 22:24   Ct Angio Chest Pe W/cm &/or Wo Cm  02/13/2015   CLINICAL DATA:  Patient with history of known lung cancer. On chemotherapy. Chest pain.  EXAM: CT ANGIOGRAPHY CHEST WITH CONTRAST  TECHNIQUE: Multidetector CT imaging of the chest was performed using the standard protocol during bolus administration of intravenous contrast. Multiplanar CT image reconstructions and MIPs were obtained to evaluate the vascular anatomy.  CONTRAST:  117m OMNIPAQUE IOHEXOL 350 MG/ML SOLN  COMPARISON:  CT 01/17/2015  FINDINGS: Mediastinum/Nodes: Visualized thyroid is unremarkable. No enlarged axillary, mediastinal or hilar lymphadenopathy. Normal heart size. No pericardial effusion. Coronary arterial vascular calcifications.  Adequate opacification of the main pulmonary artery. Motion artifact limits evaluation of the pulmonary arteries particularly within the left lower lobe. Within the above limitation no definite evidence for pulmonary embolism.  Lungs/Pleura: Central airways are patent. Interval development of  peribronchovascular ground-glass nodularity involving the right upper and right lower lobes. The previously described left lower lobe nodules are stable including a 1.3 x 1.5 cm nodule (image 43; series 7), previously 1.8 x 1.2 cm and min additional 1.3 x 1.5 cm nodule (image 56; series 7), previously 1.7 x 1.3 cm. No pleural effusion or pneumothorax.  Upper abdomen: Re- demonstrated multiple low-attenuation lesions throughout the liver  compatible with known hepatic metastatic disease.  Musculoskeletal: Re- demonstrated diffuse osseous metastatic disease involving the proximal humerus bilaterally, left clavicle, left scapula, thoracic spine and multiple ribs. Multiple healing rib fractures are demonstrated bilaterally. Re- demonstrated multiple thoracic spine compression deformities, grossly similar when compared to prior CT and MRI.  Review of the MIP images confirms the above findings.  IMPRESSION: 1. No definite evidence for pulmonary embolism. Evaluation limited within the left lower lobe due to motion artifact. 2. Interval development of peribronchovascular ground-glass nodularity predominately within the right upper and right lower lobes. Findings are nonspecific however may represent an acute infectious/inflammatory process or potentially drug reaction. 3. Findings compatible with metastatic disease involving the lungs, liver and bones.   Electronically Signed   By: Lovey Newcomer M.D.   On: 02/13/2015 23:43   Dg Chest Port 1 View  02/15/2015   CLINICAL DATA:  Chest pain, left-sided.  History of lung carcinoma  EXAM: PORTABLE CHEST - 1 VIEW  COMPARISON:  Chest radiograph and chest CT February 13, 2015  FINDINGS: There is no edema or consolidation. There is a nodular opacity in the left lower lobe which corresponds to a nodular lesions seen on recent chest CT. This nodular lesion measures 1.4 x 1.0 cm. Heart size and pulmonary vascularity are normal. No adenopathy. There are areas of bony metastatic disease,  better seen on CT. There is fragmentation along the superior, lateral left clavicle, also seen on recent CT.  IMPRESSION: Pulmonary nodular lesion left lower lobe. Areas of bony metastatic disease, better appreciable on recent chest CT. No edema or consolidation.   Electronically Signed   By: Lowella Grip III M.D.   On: 02/15/2015 21:45     EKG Interpretation None      ED ECG REPORT   Date: 02/15/2015  Rate: 91  Rhythm: normal sinus rhythm  QRS Axis: normal  Intervals: borderline QT prolongation  ST/T Wave abnormalities: normal  Conduction Disutrbances:none  Narrative Interpretation:   Old EKG Reviewed: unchanged  I have personally reviewed the EKG tracing and agree with the computerized printout as noted.  MDM   Final diagnoses:  Chest wall pain    Patient has a benign ECG and I highly doubt this is pneumonia. It is reproducible associated with cough and inspiration. He already had a pulmonary embolism ruled out 2 days ago. X-ray shows no significant infiltrate and he has normal oxygen saturation and no increased work of breathing. No signs of sepsis. Pain much improved after IV Dilaudid. I believe patient's main issue is pain, will refill a small amount of his immediate release oxycodone recommend very close follow-up with his oncologist.    Sherwood Gambler, MD 02/15/15 [phone]

## 2015-02-17 ENCOUNTER — Other Ambulatory Visit: Payer: Self-pay | Admitting: Nurse Practitioner

## 2015-02-17 ENCOUNTER — Other Ambulatory Visit (HOSPITAL_BASED_OUTPATIENT_CLINIC_OR_DEPARTMENT_OTHER): Payer: Federal, State, Local not specified - PPO

## 2015-02-17 ENCOUNTER — Encounter: Payer: Self-pay | Admitting: Nurse Practitioner

## 2015-02-17 ENCOUNTER — Ambulatory Visit (HOSPITAL_BASED_OUTPATIENT_CLINIC_OR_DEPARTMENT_OTHER): Payer: Federal, State, Local not specified - PPO

## 2015-02-17 ENCOUNTER — Ambulatory Visit (HOSPITAL_BASED_OUTPATIENT_CLINIC_OR_DEPARTMENT_OTHER): Payer: Federal, State, Local not specified - PPO | Admitting: Nurse Practitioner

## 2015-02-17 VITALS — BP 114/65 | HR 87 | Temp 98.9°F

## 2015-02-17 DIAGNOSIS — R1310 Dysphagia, unspecified: Secondary | ICD-10-CM

## 2015-02-17 DIAGNOSIS — C3490 Malignant neoplasm of unspecified part of unspecified bronchus or lung: Secondary | ICD-10-CM

## 2015-02-17 DIAGNOSIS — D6481 Anemia due to antineoplastic chemotherapy: Secondary | ICD-10-CM

## 2015-02-17 DIAGNOSIS — G893 Neoplasm related pain (acute) (chronic): Secondary | ICD-10-CM | POA: Diagnosis not present

## 2015-02-17 DIAGNOSIS — C7951 Secondary malignant neoplasm of bone: Secondary | ICD-10-CM

## 2015-02-17 DIAGNOSIS — C7A1 Malignant poorly differentiated neuroendocrine tumors: Secondary | ICD-10-CM | POA: Diagnosis not present

## 2015-02-17 DIAGNOSIS — R34 Anuria and oliguria: Secondary | ICD-10-CM | POA: Diagnosis not present

## 2015-02-17 DIAGNOSIS — T451X5A Adverse effect of antineoplastic and immunosuppressive drugs, initial encounter: Secondary | ICD-10-CM

## 2015-02-17 DIAGNOSIS — C7B8 Other secondary neuroendocrine tumors: Secondary | ICD-10-CM | POA: Diagnosis not present

## 2015-02-17 DIAGNOSIS — E8809 Other disorders of plasma-protein metabolism, not elsewhere classified: Secondary | ICD-10-CM

## 2015-02-17 LAB — CBC WITH DIFFERENTIAL/PLATELET
BASO%: 0.8 % (ref 0.0–2.0)
BASOS ABS: 0 10*3/uL (ref 0.0–0.1)
EOS%: 11 % — ABNORMAL HIGH (ref 0.0–7.0)
Eosinophils Absolute: 0.4 10*3/uL (ref 0.0–0.5)
HEMATOCRIT: 27.1 % — AB (ref 38.4–49.9)
HGB: 9.3 g/dL — ABNORMAL LOW (ref 13.0–17.1)
LYMPH%: 17 % (ref 14.0–49.0)
MCH: 30.6 pg (ref 27.2–33.4)
MCHC: 34.2 g/dL (ref 32.0–36.0)
MCV: 89.7 fL (ref 79.3–98.0)
MONO#: 0.5 10*3/uL (ref 0.1–0.9)
MONO%: 11.7 % (ref 0.0–14.0)
NEUT#: 2.4 10*3/uL (ref 1.5–6.5)
NEUT%: 59.5 % (ref 39.0–75.0)
PLATELETS: 141 10*3/uL (ref 140–400)
RBC: 3.02 10*6/uL — ABNORMAL LOW (ref 4.20–5.82)
RDW: 19.3 % — ABNORMAL HIGH (ref 11.0–14.6)
WBC: 4.1 10*3/uL (ref 4.0–10.3)
lymph#: 0.7 10*3/uL — ABNORMAL LOW (ref 0.9–3.3)

## 2015-02-17 LAB — COMPREHENSIVE METABOLIC PANEL (CC13)
ALBUMIN: 2.5 g/dL — AB (ref 3.5–5.0)
ALT: 23 U/L (ref 0–55)
ANION GAP: 8 meq/L (ref 3–11)
AST: 34 U/L (ref 5–34)
Alkaline Phosphatase: 221 U/L — ABNORMAL HIGH (ref 40–150)
BUN: 4.7 mg/dL — ABNORMAL LOW (ref 7.0–26.0)
CALCIUM: 8.6 mg/dL (ref 8.4–10.4)
CHLORIDE: 108 meq/L (ref 98–109)
CO2: 24 meq/L (ref 22–29)
Creatinine: 0.6 mg/dL — ABNORMAL LOW (ref 0.7–1.3)
EGFR: >90 mL/min/{1.73_m2} (ref >90)
Glucose: 94 mg/dl (ref 70–140)
Potassium: 3.7 mEq/L (ref 3.5–5.1)
Sodium: 139 mEq/L (ref 136–145)
Total Bilirubin: 0.57 mg/dL (ref 0.20–1.20)
Total Protein: 5.9 g/dL — ABNORMAL LOW (ref 6.4–8.3)

## 2015-02-17 LAB — MAGNESIUM (CC13): MAGNESIUM: 1.3 mg/dL — AB (ref 1.5–2.5)

## 2015-02-17 LAB — TECHNOLOGIST REVIEW

## 2015-02-17 MED ORDER — SODIUM CHLORIDE 0.9 % IV SOLN
Freq: Once | INTRAVENOUS | Status: AC
  Administered 2015-02-17: 500 mL via INTRAVENOUS

## 2015-02-17 MED ORDER — OXYCODONE HCL 10 MG PO TABS: 10.0000 mg | ORAL_TABLET | ORAL | Status: DC | PRN

## 2015-02-17 MED ORDER — SODIUM CHLORIDE 0.9 % IV SOLN
Freq: Once | INTRAVENOUS | Status: AC
  Administered 2015-02-17: 10:00:00 via INTRAVENOUS

## 2015-02-17 MED ORDER — MORPHINE SULFATE 4 MG/ML IJ SOLN
INTRAMUSCULAR | Status: AC
Start: 2015-02-17 — End: 2015-02-17
  Filled 2015-02-17: qty 1

## 2015-02-17 MED ORDER — MORPHINE SULFATE 4 MG/ML IJ SOLN
2.0000 mg | INTRAMUSCULAR | Status: DC | PRN
  Administered 2015-02-17: 2 mg via INTRAVENOUS

## 2015-02-17 MED ORDER — POTASSIUM CHLORIDE 2 MEQ/ML IV SOLN
Freq: Once | INTRAVENOUS | Status: AC
  Administered 2015-02-17: 11:00:00 via INTRAVENOUS
  Filled 2015-02-17: qty 10

## 2015-02-17 NOTE — Assessment & Plan Note (Signed)
Albumen continues low at 2.5 today.  Patient was again encouraged to push protein in his diet is much as possible.

## 2015-02-17 NOTE — Assessment & Plan Note (Signed)
Patient continues to complain of some chronic chest wall and bilateral rib discomfort.  Patient actually presented to the emergency department on 2 different occasions this past week for the same complaints.  Cardiac workup was negative.  CT angiogram of the chest obtained  this week was negative for pulmonary embolism.  He was diagnosed with questionable pneumonia; and continues to take the Levaquin anabiotic he was prescribed from the emergency department.  CT angiogram did reconfirm diffuse osseous metastatic disease involving the proximal humerus bilaterally, left clavicle, left scapula, thoracic spine, and multiple rib metastasis.  Patient also has multiple healing rib fractures bilaterally as well.  He was also noted.  The patient has multiple thoracic spine compression deformities that are essentially stable at this time.  Patient continues to deny any worsening cough or shortness of breath.  He states that he ran out of his oxycodone 10 mg tablets for breakthrough pain.  Earlier this week; and he let his pain get a little out of control.  Patient was given a refill of his oxycodone 10 mg tablets today.

## 2015-02-17 NOTE — Assessment & Plan Note (Signed)
Patient presents to the [facility] today to receive cycle 3, day 8 of his cisplatin/irinotecan chemotherapy.  He was seen in the emergency department on 2 different occasions earlier this week for complaints of chest wall/bilateral rib discomfort.  CT angiogram of the chest was negative for pulmonary embolism; the patient was diagnosed with probable pneumonia.  He was prescribed Levaquin and antibiotic; which he continues to take.  Labs essentially stable with a WBC 4.1, ANC 2.4, hemoglobin 9.3, and platelet count of 141.  Of note-patient did receive a blood transfusion last week for anemia.  On exam.-Patient appears much stronger than on previous exams.  Bilateral lungs are clear with no obvious shortness of breath.  Patient will proceed today with cycle 3, day 8 of his planned chemotherapy.  Patient has plans to return on 02/24/2015 for labs, follow up visit, and his next cycle of chemotherapy.

## 2015-02-17 NOTE — Patient Instructions (Addendum)
[place] free to call the clinic you have any questions or concerns. The clinic phone number is [phone].  Your magnesium is low today.  We will replace some in your IV fluids today.  You have magnesium tablets at home to take which you can start tomorrow.   You can also increase the amount in your diet. Hypomagnesemia Magnesium is a common ion (mineral) in the body which is needed for metabolism. It is about how the body handles food and other chemical reactions necessary for life. Only about 2% of the magnesium in our body is found in the blood. When this is low, it is called hypomagnesemia. The blood will measure only a tiny amount of the magnesium in our body. When it is low in our blood, it does not mean that the whole body supply is low. The normal serum concentration ranges from 1.8-2.5 mEq/L. When the level gets to be less than 1.0 mEq/L, a number of problems begin to happen.  CAUSES   Receiving intravenous fluids without magnesium replacement.  Loss of magnesium from the bowel by nasogastric suction.  Loss of magnesium from nausea and vomiting or severe diarrhea. Any of the inflammatory bowel conditions can cause this.  Abuse of alcohol often leads to low serum magnesium.  An inherited form of magnesium loss happens when the kidneys lose magnesium. This is called familial or primary hypomagnesemia.  Some medications such as diuretics also cause the loss of magnesium. SYMPTOMS  These following problems are worse if the changes in magnesium levels come on suddenly.  Tremor.  Confusion.  Muscle weakness.  Oversensitive to sights and sounds.  Sensitive reflexes.  Depression.  Muscular fibrillations.  Overreactivity of the nerves.  Irritability.  Psychosis.  Spasms of the hand muscles.  Tetany (where the muscles go into uncontrollable spasms). DIAGNOSIS  This condition can be diagnosed by blood tests. TREATMENT   In an emergency,  magnesium can be given intravenously (by vein).  If the condition is less worrisome, it can be corrected by diet. High levels of magnesium are found in green leafy vegetables, peas, beans, and nuts among other things. It can also be given through medications by mouth.  If it is being caused by medications, changes can be made.  If alcohol is a problem, help is available if there are difficulties giving it up. Document Released: 05/08/2005 Document Revised: 12/27/2013 Document Reviewed: 04/01/2008 [facility] Patient Information 2015 [place], [place]. This information is not intended to replace advice given to you by your health care provider. Make sure you discuss any questions you have with your health care provider.

## 2015-02-17 NOTE — Progress Notes (Signed)
Retta Mac NP here to assess patient.  Reviewed with Dr. Julien Nordmann.  OK to treat patient today with labs and current assessment. 1400  Pt. C/o pain in left rib cage, worsening with cough.  He rates it a 6/10.  Pt. Finished liter of hydration fluid with output of only 110cc of tea-colored urine. Retta Mac here to see patient.  Morphine ordered for pain.  500NS bolus ordered for fluids. Patient unable to get chemotherapy today because he has been unable to void a suffiencient quantity per Dr. Benay Spice.

## 2015-02-17 NOTE — Assessment & Plan Note (Signed)
Alkaline phosphatase elevated to 221 today.  We'll continue to monitor closely.

## 2015-02-17 NOTE — Progress Notes (Signed)
SYMPTOM MANAGEMENT CLINIC   HPI: Kerry Peck 54 y.o. male diagnosed with lung cancer.  Currently undergoing cisplatin/irinotecan chemotherapy.    Patient continues to complain of some chronic chest wall and bilateral rib discomfort.  Patient actually presented to the emergency department on 2 different occasions this past week for the same complaints.  Cardiac workup was negative.  CT angiogram of the chest obtained  this week was negative for pulmonary embolism.  He was diagnosed with questionable pneumonia; and continues to take the Levaquin anabiotic he was prescribed from the emergency department.  CT angiogram did reconfirm diffuse osseous metastatic disease involving the proximal humerus bilaterally, left clavicle, left scapula, thoracic spine, and multiple rib metastasis.  Patient also has multiple healing rib fractures bilaterally as well.  He was also noted.  The patient has multiple thoracic spine compression deformities that are essentially stable at this time.  Patient continues to deny any worsening cough or shortness of breath.  He states that he ran out of his oxycodone 10 mg tablets for breakthrough pain earlier this week; and he let his pain get a little out of control.  He denies any recent fevers or chills.  HPI  ROS  Past Medical History  Diagnosis Date  . Diabetes mellitus without complication   . Hypertension   . Smoking addiction   . Cancer     lung and liver  . Radiation 08/16/14-09/06/14    prophylatic cranial radiation 25 gray, chest, t-spine  35 gray  . Radiation 09/29/14-10/12/14    left hilar area 30 gray, right pelvis 30 gray, right lower rib cage area 30 gray  . 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 Lung cancer   . Liver cancer     Past Surgical History  Procedure Laterality Date  . Arm surgery      has Hypertension; Hyperlipidemia; Diabetes mellitus; Overweight(278.02); Nicotine addiction; Reactive airway disease; Mass of lower lobe of left  lung; Small cell lung cancer; Mucositis due to antineoplastic therapy; Peripheral edema; Bronchitis; Hypercalcemia of malignancy; Cancer associated pain; Metastatic carcinoma to bone; Uncontrolled pain; Palliative care encounter; CN (constipation); Loss of appetite; Insomnia; Protein-calorie malnutrition, severe; Nausea with vomiting; Dehydration; Weakness; Hypoalbuminemia; Antineoplastic chemotherapy induced pancytopenia; Hyperphosphatemia; Transaminitis; Hypomagnesemia; and Pancytopenia due to antineoplastic chemotherapy on his problem list.    has No Known Allergies.    Medication List       This list is accurate as of: 02/17/15 11:55 AM.  Always use your most recent med list.               albuterol 108 (90 BASE) MCG/ACT inhaler  Commonly known as:  PROVENTIL HFA;VENTOLIN HFA  Inhale 2 puffs into the lungs every 6 (six) hours as needed for wheezing.     alum & mag hydroxide-simeth 200-200-20 MG/5ML suspension  Commonly known as:  MAALOX/MYLANTA  Take 30 mLs by mouth every 6 (six) hours as needed for indigestion or heartburn (indigestion).     atorvastatin 40 MG tablet  Commonly known as:  LIPITOR  Take 40 mg by mouth daily.     benzocaine 10 % mucosal gel  Commonly known as:  ORAJEL  Use as directed 1 application in the mouth or throat 3 (three) times daily as needed for mouth pain (mouth pain).     bismuth subsalicylate 202 RK/27CW suspension  Commonly known as:  PEPTO BISMOL  Take 30 mLs by mouth every 6 (six) hours as needed for indigestion (indigestion).  calcium-vitamin D 500-200 MG-UNIT per tablet  Commonly known as:  OSCAL WITH D  Take 1 tablet by mouth daily with breakfast.     CENTRUM SILVER ADULT 50+ PO  Take 1 tablet by mouth daily.     cephALEXin 500 MG capsule  Commonly known as:  KEFLEX  Take 1 capsule (500 mg total) by mouth 3 (three) times daily.     emollient cream  Commonly known as:  BIAFINE  Apply topically as needed.      Fluticasone-Salmeterol 250-50 MCG/DOSE Aepb  Commonly known as:  ADVAIR  Inhale 2 puffs into the lungs 2 (two) times daily as needed (wheezing).     furosemide 20 MG tablet  Commonly known as:  LASIX  Take 2 tablets by mouth daily or as instructed     Insulin Glargine 100 UNIT/ML Solostar Pen  Commonly known as:  LANTUS  Inject 15 Units into the skin daily.     levofloxacin 500 MG tablet  Commonly known as:  LEVAQUIN  Take 1 tablet (500 mg total) by mouth daily.     magnesium oxide 400 (241.3 MG) MG tablet  Commonly known as:  MAGNESIUM-OXIDE  Take 1 tablet (400 mg total) by mouth 3 (three) times daily.     metFORMIN 1000 MG tablet  Commonly known as:  GLUCOPHAGE  Take 1,000 mg by mouth 2 (two) times daily with a meal.     ondansetron 8 MG tablet  Commonly known as:  ZOFRAN  Take 8 mg by mouth every 8 (eight) hours as needed for nausea or vomiting (nause and vomiting). Disp. 20 tablets. 1 refill.  Called in to [facility] per Dr. Sondra Come on 11/24/14.     ONE TOUCH ULTRA TEST test strip  Generic drug:  glucose blood  1 each by Other route 3 (three) times daily.     OxyCODONE HCl ER 60 MG T12a  Take one tablet (60 mg) by mouth every 8 hours     Oxycodone HCl 10 MG Tabs  Take 1-2 tablets (10-20 mg total) by mouth every 4 (four) hours as needed.     pantoprazole 40 MG tablet  Commonly known as:  PROTONIX  Take 40 mg by mouth daily.     Pen Needles 3/16" 31G X 5 MM Misc  Use daily with your Lantus injection     polyethylene glycol packet  Commonly known as:  MIRALAX / GLYCOLAX  Take 17 g by mouth daily.     potassium chloride SA 20 MEQ tablet  Commonly known as:  K-DUR,KLOR-CON  Take 1 tablet (20 mEq total) by mouth daily.     pyridOXINE 100 MG tablet  Commonly known as:  VITAMIN B-6  Take 100 mg by mouth daily.     senna 8.6 MG Tabs tablet  Commonly known as:  SENOKOT  Take 1 tablet (8.6 mg total) by mouth 2 (two) times daily.     sucralfate 1 GM/10ML suspension    Commonly known as:  CARAFATE  Take 10 mLs (1 g total) by mouth 4 (four) times daily -  with meals and at bedtime.     vitamin C 500 MG tablet  Commonly known as:  ASCORBIC ACID  Take 500 mg by mouth daily.         PHYSICAL EXAMINATION  Oncology Vitals 02/17/2015 02/15/2015 02/15/2015 02/15/2015 02/15/2015 02/14/2015 02/14/2015  Height - - - - - - -  Weight - - - - - - -  Weight (lbs) - - - - - - -  BMI (kg/m2) - - - - - - -  Temp 98.9 - - 97.8 - 99.1 -  Pulse 87 82 85 84 95 96 85  Resp - _0 SpO2 100 100 - 99 - 97 92  BSA (m2) - - - - - - -   BP Readings from Last 3 Encounters:  02/17/15 114/65  02/15/15 122/76  02/14/15 110/71    Physical Exam  Constitutional: He is oriented to person, place, and time. He appears malnourished and dehydrated. He appears unhealthy. He appears cachectic.  Patient appears slightly stronger than on previous exams.  However, patient continues to appear fatigued, weak, and chronically ill.  HENT:  Head: Normocephalic and atraumatic.  Mouth/Throat: Oropharynx is clear and moist.  Tongue with slight white coating to tongue. No oral lesions noted; and posterior throat with no erythema or exudate. Managing secretions with no difficulty; but does appear uncomfortable   Eyes: Conjunctivae and EOM are normal. Pupils are equal, round, and reactive to light. Right eye exhibits no discharge. Left eye exhibits no discharge. No scleral icterus.  Neck: Normal range of motion. Neck supple. No JVD present. No tracheal deviation present. No thyromegaly present.  Cardiovascular: Normal rate, regular rhythm, normal heart sounds and intact distal pulses.   Pulmonary/Chest: Effort normal and breath sounds normal. No stridor. No respiratory distress. He has no wheezes. He has no rales. He exhibits no tenderness.  Abdominal: Soft. Bowel sounds are normal. He exhibits no distension and no mass. There is no tenderness. There is no rebound and no guarding.   Musculoskeletal: Normal range of motion. He exhibits tenderness. He exhibits no edema.  Tenderness with palpation to bilateral ribs and left scapular region.  Lymphadenopathy:    He has no cervical adenopathy.  Neurological: He is alert and oriented to person, place, and time. Gait normal.  Pt appears very weak; but is able to ambulate with no assist.   Skin: Skin is warm and dry. No rash noted. No erythema. There is pallor.  Psychiatric: Affect normal.  Nursing note and vitals reviewed.   LABORATORY DATA:. Appointment on 02/17/2015  Component Date Value Ref Range Status  . WBC 02/17/2015 4.1  4.0 - 10.3 10e3/uL Final  . NEUT# 02/17/2015 2.4  1.5 - 6.5 10e3/uL Final  . HGB 02/17/2015 9.3* 13.0 - 17.1 g/dL Final  . HCT 02/17/2015 27.1* 38.4 - 49.9 % Final  . Platelets 02/17/2015 141  140 - 400 10e3/uL Final  . MCV 02/17/2015 89.7  79.3 - 98.0 fL Final  . MCH 02/17/2015 30.6  27.2 - 33.4 pg Final  . MCHC 02/17/2015 34.2  32.0 - 36.0 g/dL Final  . RBC 02/17/2015 3.02* 4.20 - 5.82 10e6/uL Final  . RDW 02/17/2015 19.3* 11.0 - 14.6 % Final  . lymph# 02/17/2015 0.7* 0.9 - 3.3 10e3/uL Final  . MONO# 02/17/2015 0.5  0.1 - 0.9 10e3/uL Final  . Eosinophils Absolute 02/17/2015 0.4  0.0 - 0.5 10e3/uL Final  . Basophils Absolute 02/17/2015 0.0  0.0 - 0.1 10e3/uL Final  . NEUT% 02/17/2015 59.5  39.0 - 75.0 % Final  . LYMPH% 02/17/2015 17.0  14.0 - 49.0 % Final  . MONO% 02/17/2015 11.7  0.0 - 14.0 % Final  . EOS% 02/17/2015 11.0* 0.0 - 7.0 % Final  . BASO% 02/17/2015 0.8  0.0 - 2.0 % Final  . Sodium 02/17/2015 139  136 - 145 mEq/L Final  . Potassium 02/17/2015 3.7  3.5 - 5.1 mEq/L  Final  . Chloride 02/17/2015 108  98 - 109 mEq/L Final  . CO2 02/17/2015 24  22 - 29 mEq/L Final  . Glucose 02/17/2015 94  70 - 140 mg/dl Final  . BUN 02/17/2015 4.7* 7.0 - 26.0 mg/dL Final  . Creatinine 02/17/2015 0.6* 0.7 - 1.3 mg/dL Final  . Total Bilirubin 02/17/2015 0.57  0.20 - 1.20 mg/dL Final  . Alkaline  Phosphatase 02/17/2015 221* 40 - 150 U/L Final  . AST 02/17/2015 34  5 - 34 U/L Final  . ALT 02/17/2015 23  0 - 55 U/L Final  . Total Protein 02/17/2015 5.9* 6.4 - 8.3 g/dL Final  . Albumin 02/17/2015 2.5* 3.5 - 5.0 g/dL Final  . Calcium 02/17/2015 8.6  8.4 - 10.4 mg/dL Final  . Anion Gap 02/17/2015 8  3 - 11 mEq/L Final  . EGFR 02/17/2015 >90  >90 ml/min/1.73 m2 Final   eGFR is calculated using the CKD-EPI Creatinine Equation (2009)  . Magnesium 02/17/2015 1.3* 1.5 - 2.5 mg/dl Final  . Technologist Review 02/17/2015 Rare metamyelocyte.   Final  Admission on 02/15/2015, Discharged on 02/15/2015  Component Date Value Ref Range Status  . WBC 02/15/2015 3.9* 4.0 - 10.5 K/uL Final  . RBC 02/15/2015 2.91* 4.22 - 5.81 MIL/uL Final  . Hemoglobin 02/15/2015 9.0* 13.0 - 17.0 g/dL Final  . HCT 02/15/2015 26.7* 39.0 - 52.0 % Final  . MCV 02/15/2015 91.8  78.0 - 100.0 fL Final  . MCH 02/15/2015 30.9  26.0 - 34.0 pg Final  . MCHC 02/15/2015 33.7  30.0 - 36.0 g/dL Final  . RDW 02/15/2015 18.5* 11.5 - 15.5 % Final  . Platelets 02/15/2015 129* 150 - 400 K/uL Final  . Sodium 02/15/2015 138  135 - 145 mmol/L Final  . Potassium 02/15/2015 3.7  3.5 - 5.1 mmol/L Final  . Chloride 02/15/2015 103  101 - 111 mmol/L Final  . CO2 02/15/2015 26  22 - 32 mmol/L Final  . Glucose, Bld 02/15/2015 160* 65 - 99 mg/dL Final  . BUN 02/15/2015 6  6 - 20 mg/dL Final  . Creatinine, Ser 02/15/2015 0.53* 0.61 - 1.24 mg/dL Final  . Calcium 02/15/2015 7.8* 8.9 - 10.3 mg/dL Final  . GFR calc non Af Amer 02/15/2015 >60  >60 mL/min Final  . GFR calc Af Amer 02/15/2015 >60  >60 mL/min Final   Comment: (NOTE) The eGFR has been calculated using the CKD EPI equation. This calculation has not been validated in all clinical situations. eGFR's persistently <60 mL/min signify possible Chronic Kidney Disease.   . Anion gap 02/15/2015 9  5 - 15 Final  . Troponin i, poc 02/15/2015 0.01  0.00 - 0.08 ng/mL Final  . Comment 3  02/15/2015          Final   Comment: Due to the release kinetics of cTnI, a negative result within the first hours of the onset of symptoms does not rule out myocardial infarction with certainty. If myocardial infarction is still suspected, repeat the test at appropriate intervals.   . Lactic Acid, Venous 02/15/2015 1.72  0.5 - 2.0 mmol/L Final     RADIOGRAPHIC STUDIES: Dg Chest 2 View  02/13/2015   CLINICAL DATA:  Patient with chest pain and shortness of breath. Started chemotherapy on Friday.  EXAM: CHEST  2 VIEW  COMPARISON:  Chest radiograph 08/23/2014  FINDINGS: Monitoring leads overlie the patient. Stable cardiac and mediastinal contours. Known nodules within the bilateral lower lobes are not as well demonstrated on  current radiograph. No pleural effusion or pneumothorax. Re- demonstrated multiple mid thoracic spine compression deformities. Diffuse osseous metastatic disease involving the thoracic spine, ribs and left scapula.  IMPRESSION: No acute cardiopulmonary process.  Known pulmonary nodules are not as well demonstrated on current radiograph.  Diffuse osseous metastatic disease with multiple wedge compression deformities of the thoracic spine.   Electronically Signed   By: Lovey Newcomer M.D.   On: 02/13/2015 22:24   Ct Angio Chest Pe W/cm &/or Wo Cm  02/13/2015   CLINICAL DATA:  Patient with history of known lung cancer. On chemotherapy. Chest pain.  EXAM: CT ANGIOGRAPHY CHEST WITH CONTRAST  TECHNIQUE: Multidetector CT imaging of the chest was performed using the standard protocol during bolus administration of intravenous contrast. Multiplanar CT image reconstructions and MIPs were obtained to evaluate the vascular anatomy.  CONTRAST:  187m OMNIPAQUE IOHEXOL 350 MG/ML SOLN  COMPARISON:  CT 01/17/2015  FINDINGS: Mediastinum/Nodes: Visualized thyroid is unremarkable. No enlarged axillary, mediastinal or hilar lymphadenopathy. Normal heart size. No pericardial effusion. Coronary arterial  vascular calcifications.  Adequate opacification of the main pulmonary artery. Motion artifact limits evaluation of the pulmonary arteries particularly within the left lower lobe. Within the above limitation no definite evidence for pulmonary embolism.  Lungs/Pleura: Central airways are patent. Interval development of peribronchovascular ground-glass nodularity involving the right upper and right lower lobes. The previously described left lower lobe nodules are stable including a 1.3 x 1.5 cm nodule (image 43; series 7), previously 1.8 x 1.2 cm and min additional 1.3 x 1.5 cm nodule (image 56; series 7), previously 1.7 x 1.3 cm. No pleural effusion or pneumothorax.  Upper abdomen: Re- demonstrated multiple low-attenuation lesions throughout the liver compatible with known hepatic metastatic disease.  Musculoskeletal: Re- demonstrated diffuse osseous metastatic disease involving the proximal humerus bilaterally, left clavicle, left scapula, thoracic spine and multiple ribs. Multiple healing rib fractures are demonstrated bilaterally. Re- demonstrated multiple thoracic spine compression deformities, grossly similar when compared to prior CT and MRI.  Review of the MIP images confirms the above findings.  IMPRESSION: 1. No definite evidence for pulmonary embolism. Evaluation limited within the left lower lobe due to motion artifact. 2. Interval development of peribronchovascular ground-glass nodularity predominately within the right upper and right lower lobes. Findings are nonspecific however may represent an acute infectious/inflammatory process or potentially drug reaction. 3. Findings compatible with metastatic disease involving the lungs, liver and bones.   Electronically Signed   By: DLovey NewcomerM.D.   On: 02/13/2015 23:43   Dg Chest Port 1 View  02/15/2015   CLINICAL DATA:  Chest pain, left-sided.  History of lung carcinoma  EXAM: PORTABLE CHEST - 1 VIEW  COMPARISON:  Chest radiograph and chest CT February 13, 2015  FINDINGS: There is no edema or consolidation. There is a nodular opacity in the left lower lobe which corresponds to a nodular lesions seen on recent chest CT. This nodular lesion measures 1.4 x 1.0 cm. Heart size and pulmonary vascularity are normal. No adenopathy. There are areas of bony metastatic disease, better seen on CT. There is fragmentation along the superior, lateral left clavicle, also seen on recent CT.  IMPRESSION: Pulmonary nodular lesion left lower lobe. Areas of bony metastatic disease, better appreciable on recent chest CT. No edema or consolidation.   Electronically Signed   By: WLowella GripIII M.D.   On: 02/15/2015 21:45    ASSESSMENT/PLAN:    Cancer associated pain Patient continues to complain of some chronic  chest wall and bilateral rib discomfort.  Patient actually presented to the emergency department on 2 different occasions this past week for the same complaints.  Cardiac workup was negative.  CT angiogram of the chest obtained  this week was negative for pulmonary embolism.  He was diagnosed with questionable pneumonia; and continues to take the Levaquin anabiotic he was prescribed from the emergency department.  CT angiogram did reconfirm diffuse osseous metastatic disease involving the proximal humerus bilaterally, left clavicle, left scapula, thoracic spine, and multiple rib metastasis.  Patient also has multiple healing rib fractures bilaterally as well.  He was also noted.  The patient has multiple thoracic spine compression deformities that are essentially stable at this time.  Patient continues to deny any worsening cough or shortness of breath.  He states that he ran out of his oxycodone 10 mg tablets for breakthrough pain.  Earlier this week; and he let his pain get a little out of control.  Patient was given a refill of his oxycodone 10 mg tablets today.  Hyperphosphatemia Alkaline phosphatase elevated to 221 today.  We'll continue to monitor  closely.  Hypoalbuminemia Albumen continues low at 2.5 today.  Patient was again encouraged to push protein in his diet is much as possible.  Hypomagnesemia Patient continues with chronic low magnesium; despite taking 6 magnesium pills per day.  Will increase the magnesium to a total of 3.5 g in patient's pre-/post hydration IV fluids associated with his chemotherapy today.  Also advised patient to try increasing the magnesium tablets up to maximum of 9 tablets per day if at all possible.  Small cell lung cancer Patient presents to the [facility] today to receive cycle 3, day 8 of his cisplatin/irinotecan chemotherapy.  He was seen in the emergency department on 2 different occasions earlier this week for complaints of chest wall/bilateral rib discomfort.  CT angiogram of the chest was negative for pulmonary embolism; the patient was diagnosed with probable pneumonia.  He was prescribed Levaquin and antibiotic; which he continues to take.  Labs essentially stable with a WBC 4.1, ANC 2.4, hemoglobin 9.3, and platelet count of 141.  Of note-patient did receive a blood transfusion last week for anemia.  On exam.-Patient appears much stronger than on previous exams.  Bilateral lungs are clear with no obvious shortness of breath.  Patient will proceed today with cycle 3, day 8 of his planned chemotherapy.  Patient has plans to return on 02/24/2015 for labs, follow up visit, and his next cycle of chemotherapy.   Patient stated understanding of all instructions; and was in agreement with this plan of care. The patient knows to call the clinic with any problems, questions or concerns.   Review/collaboration with Dr. Julien Nordmann regarding all aspects of patient's visit today.   Total time spent with patient was 25 minutes with greater than 75 percent of that time spent in face to face counseling regarding patient's symptoms,  and coordination of care and follow up.  Disclaimer: This note was dictated  with voice recognition software. Similar sounding words can inadvertently be transcribed and may not be corrected upon review.   Drue Second, NP 02/17/2015

## 2015-02-17 NOTE — Progress Notes (Signed)
Addendum:   Patient with inadequate urine output to proceed with chemotherapy as planned today; despite receiving additional IV fluid hydration and pushing fluids.  Decision was made to reschedule planned chemotherapy for next week.  Patient was advised to call/return of electrical to the emergency department over the weekend if he develops any worsening symptoms whatsoever.

## 2015-02-17 NOTE — Assessment & Plan Note (Signed)
Patient continues with chronic low magnesium; despite taking 6 magnesium pills per day.  Will increase the magnesium to a total of 3.5 g in patient's pre-/post hydration IV fluids associated with his chemotherapy today.  Also advised patient to try increasing the magnesium tablets up to maximum of 9 tablets per day if at all possible.

## 2015-02-19 LAB — CULTURE, BLOOD (ROUTINE X 2)
CULTURE: NO GROWTH
Culture: NO GROWTH

## 2015-02-21 ENCOUNTER — Telehealth: Payer: Self-pay

## 2015-02-21 NOTE — Telephone Encounter (Signed)
Called and LM with pt to follow up from visit on 6/24 with Cyndee Bacon,NP. LM and informed pt to call back if he has any questions or concerns before his appt with Dr.Mohamed on 7/1.

## 2015-02-24 ENCOUNTER — Ambulatory Visit: Payer: Federal, State, Local not specified - PPO | Admitting: Internal Medicine

## 2015-02-24 ENCOUNTER — Ambulatory Visit (HOSPITAL_BASED_OUTPATIENT_CLINIC_OR_DEPARTMENT_OTHER): Payer: Federal, State, Local not specified - PPO | Admitting: Internal Medicine

## 2015-02-24 ENCOUNTER — Encounter: Payer: Self-pay | Admitting: Internal Medicine

## 2015-02-24 ENCOUNTER — Other Ambulatory Visit (HOSPITAL_BASED_OUTPATIENT_CLINIC_OR_DEPARTMENT_OTHER): Payer: Federal, State, Local not specified - PPO

## 2015-02-24 ENCOUNTER — Telehealth: Payer: Self-pay | Admitting: Internal Medicine

## 2015-02-24 ENCOUNTER — Ambulatory Visit: Payer: Federal, State, Local not specified - PPO

## 2015-02-24 ENCOUNTER — Other Ambulatory Visit: Payer: Federal, State, Local not specified - PPO

## 2015-02-24 VITALS — BP 95/68 | HR 84 | Temp 98.6°F | Resp 17 | Ht 74.0 in | Wt 178.3 lb

## 2015-02-24 DIAGNOSIS — C7A1 Malignant poorly differentiated neuroendocrine tumors: Secondary | ICD-10-CM

## 2015-02-24 DIAGNOSIS — R1310 Dysphagia, unspecified: Secondary | ICD-10-CM

## 2015-02-24 DIAGNOSIS — D6481 Anemia due to antineoplastic chemotherapy: Secondary | ICD-10-CM

## 2015-02-24 DIAGNOSIS — C3490 Malignant neoplasm of unspecified part of unspecified bronchus or lung: Secondary | ICD-10-CM

## 2015-02-24 DIAGNOSIS — C7951 Secondary malignant neoplasm of bone: Secondary | ICD-10-CM

## 2015-02-24 DIAGNOSIS — C7B8 Other secondary neuroendocrine tumors: Secondary | ICD-10-CM | POA: Diagnosis not present

## 2015-02-24 DIAGNOSIS — T451X5A Adverse effect of antineoplastic and immunosuppressive drugs, initial encounter: Secondary | ICD-10-CM

## 2015-02-24 LAB — COMPREHENSIVE METABOLIC PANEL (CC13)
ALT: 18 U/L (ref 0–55)
ANION GAP: 9 meq/L (ref 3–11)
AST: 28 U/L (ref 5–34)
Albumin: 2.7 g/dL — ABNORMAL LOW (ref 3.5–5.0)
Alkaline Phosphatase: 257 U/L — ABNORMAL HIGH (ref 40–150)
BILIRUBIN TOTAL: 0.52 mg/dL (ref 0.20–1.20)
BUN: 9.2 mg/dL (ref 7.0–26.0)
CO2: 28 mEq/L (ref 22–29)
Calcium: 8.9 mg/dL (ref 8.4–10.4)
Chloride: 104 mEq/L (ref 98–109)
Creatinine: 0.6 mg/dL — ABNORMAL LOW (ref 0.7–1.3)
Glucose: 73 mg/dl (ref 70–140)
Potassium: 4 mEq/L (ref 3.5–5.1)
Sodium: 141 mEq/L (ref 136–145)
Total Protein: 5.8 g/dL — ABNORMAL LOW (ref 6.4–8.3)

## 2015-02-24 LAB — CBC WITH DIFFERENTIAL/PLATELET
BASO%: 0.3 % (ref 0.0–2.0)
Basophils Absolute: 0 10*3/uL (ref 0.0–0.1)
EOS%: 7.9 % — ABNORMAL HIGH (ref 0.0–7.0)
Eosinophils Absolute: 0.3 10*3/uL (ref 0.0–0.5)
HEMATOCRIT: 25.7 % — AB (ref 38.4–49.9)
HEMOGLOBIN: 8.6 g/dL — AB (ref 13.0–17.1)
LYMPH%: 16.6 % (ref 14.0–49.0)
MCH: 31.2 pg (ref 27.2–33.4)
MCHC: 33.5 g/dL (ref 32.0–36.0)
MCV: 93.1 fL (ref 79.3–98.0)
MONO#: 0.6 10*3/uL (ref 0.1–0.9)
MONO%: 14.5 % — AB (ref 0.0–14.0)
NEUT%: 60.7 % (ref 39.0–75.0)
NEUTROS ABS: 2.3 10*3/uL (ref 1.5–6.5)
Platelets: 125 10*3/uL — ABNORMAL LOW (ref 140–400)
RBC: 2.76 10*6/uL — ABNORMAL LOW (ref 4.20–5.82)
RDW: 19.8 % — ABNORMAL HIGH (ref 11.0–14.6)
WBC: 3.8 10*3/uL — AB (ref 4.0–10.3)
lymph#: 0.6 10*3/uL — ABNORMAL LOW (ref 0.9–3.3)

## 2015-02-24 LAB — MAGNESIUM (CC13): Magnesium: 1.6 mg/dl (ref 1.5–2.5)

## 2015-02-24 MED ORDER — OXYCODONE HCL 10 MG PO TABS: 10.0000 mg | ORAL_TABLET | ORAL | Status: DC | PRN

## 2015-02-24 NOTE — Progress Notes (Signed)
[place] [phone]   [phone]  OFFICE PROGRESS NOTE  [name], MD [place] [address]  DIAGNOSIS: Extensive stage, Stage IV (T2a, N1, M1b) small cell lung cancer diagnosed in July of 2015.  PRIOR THERAPY:  1) Systemic chemotherapy with carboplatin for AUC of 5 on day 1 and etoposide at 120 mg/M2 on days 1, 2 and 3 with Neulasta support on day 4. Status post 6 cycles.  2) Prophylactic cranial irradiation under the care of Dr. Sondra Come. 3) Palliative radiotherapy to the T7-spine completed on 09/06/2014.  CURRENT THERAPY: Second line systemic chemotherapy with cisplatin 30 MG/M2 and irinotecan 65 MG/M2 on days 1 and 8 every 3 weeks, status post 3 cycles.  INTERVAL HISTORY: Kerry Peck 54 y.o. male returns to the clinic today for follow up visit. The patient is currently undergoing systemic chemotherapy with cisplatin and irinotecan is status post 3 cycles and tolerating his treatment fairly well. He missed day 8 of the third cycle because of low urine output. The patient denied having any significant chest pain, shortness of breath, cough or hemoptysis. He has no significant nausea or vomiting, no fever or chills. He is expected to start cycle #4 next week. For pain management he is currently on OxyContin 60 mg by mouth twice a day in addition to oxycodone 10 mg by mouth Q4-6 hours as needed for pain.  MEDICAL HISTORY: Past Medical History  Diagnosis Date  . Diabetes mellitus without complication   . Hypertension   . Smoking addiction   . Cancer     lung and liver  . Radiation 08/16/14-09/06/14    prophylatic cranial radiation 25 gray, chest, t-spine  35 gray  . Radiation 09/29/14-10/12/14    left hilar area 30 gray, right pelvis 30 gray, right lower rib cage area 30 gray  . 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 Lung cancer   . Liver cancer     ALLERGIES:  has No Known Allergies.  MEDICATIONS:    Current Outpatient Prescriptions  Medication Sig Dispense Refill  . albuterol (PROVENTIL HFA;VENTOLIN HFA) 108 (90 BASE) MCG/ACT inhaler Inhale 2 puffs into the lungs every 6 (six) hours as needed for wheezing. 1 Inhaler 0  . alum & mag hydroxide-simeth (MAALOX/MYLANTA) 200-200-20 MG/5ML suspension Take 30 mLs by mouth every 6 (six) hours as needed for indigestion or heartburn (indigestion).    Marland Kitchen atorvastatin (LIPITOR) 40 MG tablet Take 40 mg by mouth daily.    . benzocaine (ORAJEL) 10 % mucosal gel Use as directed 1 application in the mouth or throat 3 (three) times daily as needed for mouth pain (mouth pain).    . bismuth subsalicylate (PEPTO BISMOL) 262 MG/15ML suspension Take 30 mLs by mouth every 6 (six) hours as needed for indigestion (indigestion).    . calcium-vitamin D (OSCAL WITH D) 500-200 MG-UNIT per tablet Take 1 tablet by mouth daily with breakfast.     . emollient (BIAFINE) cream Apply topically as needed. (Patient taking differently: Apply 1 application topically as needed. For radiation itch.) 454 g 0  . Fluticasone-Salmeterol (ADVAIR) 250-50 MCG/DOSE AEPB Inhale 2 puffs into the lungs 2 (two) times daily as needed (wheezing).     . furosemide (LASIX) 20 MG tablet Take 2 tablets by mouth daily or as instructed 30 tablet 0  . Insulin Glargine (LANTUS) 100 UNIT/ML Solostar Pen Inject 15 Units into the skin daily. 15 mL 0  . Insulin Pen Needle (  PEN NEEDLES 3/16") 31G X 5 MM MISC Use daily with your Lantus injection 90 each 0  . levofloxacin (LEVAQUIN) 500 MG tablet Take 1 tablet (500 mg total) by mouth daily. 7 tablet 0  . magnesium oxide (MAGNESIUM-OXIDE) 400 (241.3 MG) MG tablet Take 1 tablet (400 mg total) by mouth 3 (three) times daily. (Patient taking differently: Take 1 tablet by mouth 6 (six) times daily. ) 90 tablet 1  . metFORMIN (GLUCOPHAGE) 1000 MG tablet Take 1,000 mg by mouth 2 (two) times daily with a meal.    . Multiple Vitamins-Minerals (CENTRUM SILVER ADULT 50+ PO)  Take 1 tablet by mouth daily.     . ondansetron (ZOFRAN) 8 MG tablet Take 8 mg by mouth every 8 (eight) hours as needed for nausea or vomiting (nause and vomiting). Disp. 20 tablets. 1 refill.  Called in to [facility] per Dr. Sondra Come on 11/24/14.    Marland Kitchen ONE TOUCH ULTRA TEST test strip 1 each by Other route 3 (three) times daily.     . Oxycodone HCl 10 MG TABS Take 1-2 tablets (10-20 mg total) by mouth every 4 (four) hours as needed. 60 tablet 0  . OxyCODONE HCl ER 60 MG T12A Take one tablet (60 mg) by mouth every 8 hours 90 each 0  . pantoprazole (PROTONIX) 40 MG tablet Take 40 mg by mouth daily.     . polyethylene glycol (MIRALAX / GLYCOLAX) packet Take 17 g by mouth daily. 14 each 0  . potassium chloride SA (K-DUR,KLOR-CON) 20 MEQ tablet Take 1 tablet (20 mEq total) by mouth daily. 30 tablet 0  . pyridOXINE (VITAMIN B-6) 100 MG tablet Take 100 mg by mouth daily.    Marland Kitchen senna (SENOKOT) 8.6 MG TABS tablet Take 1 tablet (8.6 mg total) by mouth 2 (two) times daily. 120 each 0  . sucralfate (CARAFATE) 1 GM/10ML suspension Take 10 mLs (1 g total) by mouth 4 (four) times daily -  with meals and at bedtime. (Patient taking differently: Take 1 g by mouth 3 (three) times daily as needed (throat pain). ) 420 mL 1  . vitamin C (ASCORBIC ACID) 500 MG tablet Take 500 mg by mouth daily.     No current facility-administered medications for this visit.   Facility-Administered Medications Ordered in Other Visits  Medication Dose Route Frequency Provider Last Rate Last Dose  . morphine 4 MG/ML injection 2 mg  2 mg Intravenous Q30 min PRN Susanne Borders, NP   2 mg at 11/23/14 1345    REVIEW OF SYSTEMS:  A comprehensive review of systems was negative except for: Constitutional: positive for fatigue Musculoskeletal: positive for back pain   PHYSICAL EXAMINATION: General appearance: alert, cooperative and no distress Head: Normocephalic, without obvious abnormality, atraumatic Neck: no adenopathy, no JVD, supple,  symmetrical, trachea midline and thyroid not enlarged, symmetric, no tenderness/mass/nodules Lymph nodes: Cervical, supraclavicular, and axillary nodes normal. Resp: clear to auscultation bilaterally Back: symmetric, no curvature. ROM normal. No CVA tenderness. Cardio: regular rate and rhythm, S1, S2 normal, no murmur, click, rub or gallop GI: soft, non-tender; bowel sounds normal; no masses,  no organomegaly Extremities: extremities normal, atraumatic, no cyanosis or edema Neurologic: Alert and oriented X 3, normal strength and tone. Normal symmetric reflexes. Normal coordination and gait  ECOG PERFORMANCE STATUS: 2 - Symptomatic, <50% confined to bed  Blood pressure 95/68, pulse 84, temperature 98.6 F (37 C), temperature source Oral, resp. rate 17, height '6\' 2"'$  (1.88 m), weight 178 lb 4.8 oz (80.876  kg), SpO2 100 %.  LABORATORY DATA: Lab Results  Component Value Date   WBC 3.8* 02/24/2015   HGB 8.6* 02/24/2015   HCT 25.7* 02/24/2015   MCV 93.1 02/24/2015   PLT 125* 02/24/2015      Chemistry      Component Value Date/Time   NA 139 02/17/2015 0837   NA 138 02/15/2015 2148   K 3.7 02/17/2015 0837   K 3.7 02/15/2015 2148   CL 103 02/15/2015 2148   CO2 24 02/17/2015 0837   CO2 26 02/15/2015 2148   BUN 4.7* 02/17/2015 0837   BUN 6 02/15/2015 2148   CREATININE 0.6* 02/17/2015 0837   CREATININE 0.53* 02/15/2015 2148      Component Value Date/Time   CALCIUM 8.6 02/17/2015 0837   CALCIUM 7.8* 02/15/2015 2148   ALKPHOS 221* 02/17/2015 0837   ALKPHOS 201* 02/13/2015 2203   AST 34 02/17/2015 0837   AST 56* 02/13/2015 2203   ALT 23 02/17/2015 0837   ALT 24 02/13/2015 2203   BILITOT 0.57 02/17/2015 0837   BILITOT 0.7 02/13/2015 2203       RADIOGRAPHIC STUDIES: Dg Chest 2 View  02/13/2015   CLINICAL DATA:  Patient with chest pain and shortness of breath. Started chemotherapy on Friday.  EXAM: CHEST  2 VIEW  COMPARISON:  Chest radiograph 08/23/2014  FINDINGS: Monitoring  leads overlie the patient. Stable cardiac and mediastinal contours. Known nodules within the bilateral lower lobes are not as well demonstrated on current radiograph. No pleural effusion or pneumothorax. Re- demonstrated multiple mid thoracic spine compression deformities. Diffuse osseous metastatic disease involving the thoracic spine, ribs and left scapula.  IMPRESSION: No acute cardiopulmonary process.  Known pulmonary nodules are not as well demonstrated on current radiograph.  Diffuse osseous metastatic disease with multiple wedge compression deformities of the thoracic spine.   Electronically Signed   By: Lovey Newcomer M.D.   On: 02/13/2015 22:24   Ct Angio Chest Pe W/cm &/or Wo Cm  02/13/2015   CLINICAL DATA:  Patient with history of known lung cancer. On chemotherapy. Chest pain.  EXAM: CT ANGIOGRAPHY CHEST WITH CONTRAST  TECHNIQUE: Multidetector CT imaging of the chest was performed using the standard protocol during bolus administration of intravenous contrast. Multiplanar CT image reconstructions and MIPs were obtained to evaluate the vascular anatomy.  CONTRAST:  149m OMNIPAQUE IOHEXOL 350 MG/ML SOLN  COMPARISON:  CT 01/17/2015  FINDINGS: Mediastinum/Nodes: Visualized thyroid is unremarkable. No enlarged axillary, mediastinal or hilar lymphadenopathy. Normal heart size. No pericardial effusion. Coronary arterial vascular calcifications.  Adequate opacification of the main pulmonary artery. Motion artifact limits evaluation of the pulmonary arteries particularly within the left lower lobe. Within the above limitation no definite evidence for pulmonary embolism.  Lungs/Pleura: Central airways are patent. Interval development of peribronchovascular ground-glass nodularity involving the right upper and right lower lobes. The previously described left lower lobe nodules are stable including a 1.3 x 1.5 cm nodule (image 43; series 7), previously 1.8 x 1.2 cm and min additional 1.3 x 1.5 cm nodule (image 56;  series 7), previously 1.7 x 1.3 cm. No pleural effusion or pneumothorax.  Upper abdomen: Re- demonstrated multiple low-attenuation lesions throughout the liver compatible with known hepatic metastatic disease.  Musculoskeletal: Re- demonstrated diffuse osseous metastatic disease involving the proximal humerus bilaterally, left clavicle, left scapula, thoracic spine and multiple ribs. Multiple healing rib fractures are demonstrated bilaterally. Re- demonstrated multiple thoracic spine compression deformities, grossly similar when compared to prior CT and MRI.  Review  of the MIP images confirms the above findings.  IMPRESSION: 1. No definite evidence for pulmonary embolism. Evaluation limited within the left lower lobe due to motion artifact. 2. Interval development of peribronchovascular ground-glass nodularity predominately within the right upper and right lower lobes. Findings are nonspecific however may represent an acute infectious/inflammatory process or potentially drug reaction. 3. Findings compatible with metastatic disease involving the lungs, liver and bones.   Electronically Signed   By: Lovey Newcomer M.D.   On: 02/13/2015 23:43   Dg Chest Port 1 View  02/15/2015   CLINICAL DATA:  Chest pain, left-sided.  History of lung carcinoma  EXAM: PORTABLE CHEST - 1 VIEW  COMPARISON:  Chest radiograph and chest CT February 13, 2015  FINDINGS: There is no edema or consolidation. There is a nodular opacity in the left lower lobe which corresponds to a nodular lesions seen on recent chest CT. This nodular lesion measures 1.4 x 1.0 cm. Heart size and pulmonary vascularity are normal. No adenopathy. There are areas of bony metastatic disease, better seen on CT. There is fragmentation along the superior, lateral left clavicle, also seen on recent CT.  IMPRESSION: Pulmonary nodular lesion left lower lobe. Areas of bony metastatic disease, better appreciable on recent chest CT. No edema or consolidation.   Electronically  Signed   By: Lowella Grip III M.D.   On: 02/15/2015 21:45    ASSESSMENT AND PLAN: this is a very pleasant 54 years old white male recently diagnosed with extensive stage small cell lung cancer status post 6 cycles of systemic chemotherapy with carboplatin and etoposide with partial response. This was followed by prophylactic cranial irradiation in addition to palliative radiotherapy to the T7 thoracic spine and the chest. The recent CT scan of the Chest, Abdomen and pelvis showed evidence for further disease progression with progressive osseous metastasis and tumor canal encroachment involving the thoracic spine as well as development of hepatic metastasis and nodal metastasis within the upper abdomen. He is currently undergoing systemic chemotherapy with cisplatin and irinotecan is status post 3 cycles. The patient will have repeat CT scan of the chest, abdomen and pelvis next week for restaging of his disease before starting cycle #4. For pain management, the patient will continue on OxyContin and oxycodone for breakthrough pain. He was given a refill of oxycodone today The patient was advised to call immediately if he has any concerning symptoms in the interval. The patient voices understanding of current disease status and treatment options and is in agreement with the current care plan.  All questions were answered. The patient knows to call the clinic with any problems, questions or concerns. We can certainly see the patient much sooner if necessary.  Disclaimer: This note was dictated with voice recognition software. Similar sounding words can inadvertently be transcribed and may not be corrected upon review.

## 2015-02-24 NOTE — Telephone Encounter (Signed)
Gave patient avs report and appointments for July. No pof sent today confirmed with MM patient to keep apppointments as scheduled for 7/7, 7/8, and 7/15.

## 2015-03-01 ENCOUNTER — Telehealth: Payer: Self-pay | Admitting: Physician Assistant

## 2015-03-01 ENCOUNTER — Ambulatory Visit (HOSPITAL_COMMUNITY)
Admission: RE | Admit: 2015-03-01 | Discharge: 2015-03-01 | Disposition: A | Discharge status: Home / Self Care | Payer: Federal, State, Local not specified - PPO | Source: Ambulatory Visit | Attending: Physician Assistant | Admitting: Physician Assistant

## 2015-03-01 ENCOUNTER — Encounter (HOSPITAL_COMMUNITY): Payer: Self-pay

## 2015-03-01 ENCOUNTER — Ambulatory Visit (HOSPITAL_COMMUNITY): Payer: Federal, State, Local not specified - PPO

## 2015-03-01 DIAGNOSIS — C7951 Secondary malignant neoplasm of bone: Secondary | ICD-10-CM | POA: Insufficient documentation

## 2015-03-01 DIAGNOSIS — C787 Secondary malignant neoplasm of liver and intrahepatic bile duct: Secondary | ICD-10-CM | POA: Diagnosis not present

## 2015-03-01 DIAGNOSIS — K8020 Calculus of gallbladder without cholecystitis without obstruction: Secondary | ICD-10-CM | POA: Insufficient documentation

## 2015-03-01 DIAGNOSIS — I700 Atherosclerosis of aorta: Secondary | ICD-10-CM | POA: Insufficient documentation

## 2015-03-01 DIAGNOSIS — C3490 Malignant neoplasm of unspecified part of unspecified bronchus or lung: Secondary | ICD-10-CM | POA: Diagnosis present

## 2015-03-01 MED ORDER — IOHEXOL 300 MG/ML  SOLN
100.0000 mL | Freq: Once | INTRAMUSCULAR | Status: AC | PRN
  Administered 2015-03-01: 100 mL via INTRAVENOUS

## 2015-03-01 NOTE — Telephone Encounter (Signed)
pt came by to get updated sch

## 2015-03-02 ENCOUNTER — Encounter: Payer: Self-pay | Admitting: Internal Medicine

## 2015-03-02 ENCOUNTER — Telehealth: Payer: Self-pay | Admitting: *Deleted

## 2015-03-02 ENCOUNTER — Other Ambulatory Visit: Payer: Self-pay | Admitting: *Deleted

## 2015-03-02 ENCOUNTER — Telehealth: Payer: Self-pay | Admitting: Internal Medicine

## 2015-03-02 ENCOUNTER — Other Ambulatory Visit (HOSPITAL_BASED_OUTPATIENT_CLINIC_OR_DEPARTMENT_OTHER): Payer: Federal, State, Local not specified - PPO

## 2015-03-02 ENCOUNTER — Ambulatory Visit (HOSPITAL_BASED_OUTPATIENT_CLINIC_OR_DEPARTMENT_OTHER): Payer: Federal, State, Local not specified - PPO | Admitting: Internal Medicine

## 2015-03-02 VITALS — BP 111/63 | HR 109 | Temp 98.9°F | Resp 19 | Ht 74.0 in | Wt 181.0 lb

## 2015-03-02 DIAGNOSIS — D61810 Antineoplastic chemotherapy induced pancytopenia: Secondary | ICD-10-CM | POA: Diagnosis not present

## 2015-03-02 DIAGNOSIS — Z5111 Encounter for antineoplastic chemotherapy: Secondary | ICD-10-CM

## 2015-03-02 DIAGNOSIS — C7A1 Malignant poorly differentiated neuroendocrine tumors: Secondary | ICD-10-CM

## 2015-03-02 DIAGNOSIS — R531 Weakness: Secondary | ICD-10-CM

## 2015-03-02 DIAGNOSIS — T451X5A Adverse effect of antineoplastic and immunosuppressive drugs, initial encounter: Secondary | ICD-10-CM

## 2015-03-02 DIAGNOSIS — C7B8 Other secondary neuroendocrine tumors: Secondary | ICD-10-CM

## 2015-03-02 DIAGNOSIS — C3490 Malignant neoplasm of unspecified part of unspecified bronchus or lung: Secondary | ICD-10-CM

## 2015-03-02 DIAGNOSIS — C7951 Secondary malignant neoplasm of bone: Secondary | ICD-10-CM

## 2015-03-02 LAB — COMPREHENSIVE METABOLIC PANEL (CC13)
ALBUMIN: 2.6 g/dL — AB (ref 3.5–5.0)
ALT: 14 U/L (ref 0–55)
AST: 30 U/L (ref 5–34)
Alkaline Phosphatase: 259 U/L — ABNORMAL HIGH (ref 40–150)
Anion Gap: 8 mEq/L (ref 3–11)
BUN: 8.7 mg/dL (ref 7.0–26.0)
CHLORIDE: 103 meq/L (ref 98–109)
CO2: 28 meq/L (ref 22–29)
Calcium: 9.3 mg/dL (ref 8.4–10.4)
Creatinine: 0.6 mg/dL — ABNORMAL LOW (ref 0.7–1.3)
Glucose: 150 mg/dl — ABNORMAL HIGH (ref 70–140)
Potassium: 4.4 mEq/L (ref 3.5–5.1)
SODIUM: 138 meq/L (ref 136–145)
TOTAL PROTEIN: 6 g/dL — AB (ref 6.4–8.3)
Total Bilirubin: 0.51 mg/dL (ref 0.20–1.20)

## 2015-03-02 LAB — CBC WITH DIFFERENTIAL/PLATELET
BASO%: 0.3 % (ref 0.0–2.0)
Basophils Absolute: 0 10*3/uL (ref 0.0–0.1)
EOS ABS: 0.2 10*3/uL (ref 0.0–0.5)
EOS%: 6.5 % (ref 0.0–7.0)
HCT: 25.4 % — ABNORMAL LOW (ref 38.4–49.9)
HEMOGLOBIN: 8.4 g/dL — AB (ref 13.0–17.1)
LYMPH#: 1.2 10*3/uL (ref 0.9–3.3)
LYMPH%: 33.1 % (ref 14.0–49.0)
MCH: 31.8 pg (ref 27.2–33.4)
MCHC: 33.1 g/dL (ref 32.0–36.0)
MCV: 96.2 fL (ref 79.3–98.0)
MONO#: 0.7 10*3/uL (ref 0.1–0.9)
MONO%: 20.6 % — ABNORMAL HIGH (ref 0.0–14.0)
NEUT%: 39.5 % (ref 39.0–75.0)
NEUTROS ABS: 1.4 10*3/uL — AB (ref 1.5–6.5)
Platelets: 162 10*3/uL (ref 140–400)
RBC: 2.64 10*6/uL — ABNORMAL LOW (ref 4.20–5.82)
RDW: 20.4 % — ABNORMAL HIGH (ref 11.0–14.6)
WBC: 3.5 10*3/uL — AB (ref 4.0–10.3)

## 2015-03-02 LAB — MAGNESIUM (CC13): Magnesium: 1.6 mg/dl (ref 1.5–2.5)

## 2015-03-02 NOTE — Telephone Encounter (Signed)
Pt confirmed labs/ov per 07/07 POF, gave pt AVS and Calendar.... KJ, sent msg to add chemo

## 2015-03-02 NOTE — Progress Notes (Signed)
[place] [phone]   [phone]  OFFICE PROGRESS NOTE  [name], MD [place] [address]  DIAGNOSIS: Extensive stage, Stage IV (T2a, N1, M1b) small cell lung cancer diagnosed in July of 2015.  PRIOR THERAPY:  1) Systemic chemotherapy with carboplatin for AUC of 5 on day 1 and etoposide at 120 mg/M2 on days 1, 2 and 3 with Neulasta support on day 4. Status post 6 cycles.  2) Prophylactic cranial irradiation under the care of Dr. Sondra Come. 3) Palliative radiotherapy to the T7-spine completed on 09/06/2014.  CURRENT THERAPY: Second line systemic chemotherapy with cisplatin 30 MG/M2 and irinotecan 65 MG/M2 on days 1 and 8 every 3 weeks, status post 3 cycles.  INTERVAL HISTORY: Kerry Peck 54 y.o. male returns to the clinic today for follow up visit. The patient is currently undergoing systemic chemotherapy with cisplatin and irinotecan is status post 3 cycles and tolerating his treatment fairly well. The patient denied having any significant chest pain, shortness of breath, cough or hemoptysis. He has no significant nausea or vomiting, no fever or chills. For pain management he is currently on OxyContin 60 mg by mouth twice a day in addition to oxycodone 10 mg by mouth Q4-6 hours as needed for pain. He does not use the OxyContin as prescribed and continue to use the short-acting more frequently. He had repeat CT scan of the chest, abdomen and pelvis performed yesterday and he is here for evaluation and discussion of his scan results.  MEDICAL HISTORY: Past Medical History  Diagnosis Date  . Hypertension   . Smoking addiction   . Radiation 08/16/14-09/06/14    prophylatic cranial radiation 25 gray, chest, t-spine  35 gray  . Radiation 09/29/14-10/12/14    left hilar area 30 gray, right pelvis 30 gray, right lower rib cage area 30 gray  . Cancer     lung and liver  . 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 Lung  cancer   . Liver cancer   . Diabetes mellitus without complication     ALLERGIES:  has No Known Allergies.  MEDICATIONS:  Current Outpatient Prescriptions  Medication Sig Dispense Refill  . albuterol (PROVENTIL HFA;VENTOLIN HFA) 108 (90 BASE) MCG/ACT inhaler Inhale 2 puffs into the lungs every 6 (six) hours as needed for wheezing. 1 Inhaler 0  . alum & mag hydroxide-simeth (MAALOX/MYLANTA) 200-200-20 MG/5ML suspension Take 30 mLs by mouth every 6 (six) hours as needed for indigestion or heartburn (indigestion).    Marland Kitchen atorvastatin (LIPITOR) 40 MG tablet Take 40 mg by mouth daily.    . benzocaine (ORAJEL) 10 % mucosal gel Use as directed 1 application in the mouth or throat 3 (three) times daily as needed for mouth pain (mouth pain).    . bismuth subsalicylate (PEPTO BISMOL) 262 MG/15ML suspension Take 30 mLs by mouth every 6 (six) hours as needed for indigestion (indigestion).    . calcium-vitamin D (OSCAL WITH D) 500-200 MG-UNIT per tablet Take 1 tablet by mouth daily with breakfast.     . cephALEXin (KEFLEX) 500 MG capsule TK ONE C PO TID  0  . emollient (BIAFINE) cream Apply topically as needed. (Patient taking differently: Apply 1 application topically as needed. For radiation itch.) 454 g 0  . Fluticasone-Salmeterol (ADVAIR) 250-50 MCG/DOSE AEPB Inhale 2 puffs into the lungs 2 (two) times daily as needed (wheezing).     . furosemide (LASIX) 20 MG tablet Take 2 tablets by  mouth daily or as instructed 30 tablet 0  . Insulin Glargine (LANTUS) 100 UNIT/ML Solostar Pen Inject 15 Units into the skin daily. 15 mL 0  . Insulin Pen Needle (PEN NEEDLES 3/16") 31G X 5 MM MISC Use daily with your Lantus injection 90 each 0  . levofloxacin (LEVAQUIN) 500 MG tablet Take 1 tablet (500 mg total) by mouth daily. 7 tablet 0  . magnesium oxide (MAGNESIUM-OXIDE) 400 (241.3 MG) MG tablet Take 1 tablet (400 mg total) by mouth 3 (three) times daily. (Patient taking differently: Take 1 tablet by mouth 6 (six)  times daily. ) 90 tablet 1  . metFORMIN (GLUCOPHAGE) 1000 MG tablet Take 1,000 mg by mouth 2 (two) times daily with a meal.    . Multiple Vitamins-Minerals (CENTRUM SILVER ADULT 50+ PO) Take 1 tablet by mouth daily.     . ondansetron (ZOFRAN) 8 MG tablet Take 8 mg by mouth every 8 (eight) hours as needed for nausea or vomiting (nause and vomiting). Disp. 20 tablets. 1 refill.  Called in to [facility] per Dr. Sondra Come on 11/24/14.    Marland Kitchen ONE TOUCH ULTRA TEST test strip 1 each by Other route 3 (three) times daily.     . Oxycodone HCl 10 MG TABS Take 1-2 tablets (10-20 mg total) by mouth every 4 (four) hours as needed. 60 tablet 0  . OxyCODONE HCl ER 60 MG T12A Take one tablet (60 mg) by mouth every 8 hours 90 each 0  . pantoprazole (PROTONIX) 40 MG tablet Take 40 mg by mouth daily.     . polyethylene glycol (MIRALAX / GLYCOLAX) packet Take 17 g by mouth daily. 14 each 0  . potassium chloride SA (K-DUR,KLOR-CON) 20 MEQ tablet Take 1 tablet (20 mEq total) by mouth daily. 30 tablet 0  . pyridOXINE (VITAMIN B-6) 100 MG tablet Take 100 mg by mouth daily.    Marland Kitchen senna (SENOKOT) 8.6 MG TABS tablet Take 1 tablet (8.6 mg total) by mouth 2 (two) times daily. 120 each 0  . sucralfate (CARAFATE) 1 GM/10ML suspension Take 10 mLs (1 g total) by mouth 4 (four) times daily -  with meals and at bedtime. (Patient taking differently: Take 1 g by mouth 3 (three) times daily as needed (throat pain). ) 420 mL 1  . vitamin C (ASCORBIC ACID) 500 MG tablet Take 500 mg by mouth daily.     No current facility-administered medications for this visit.   Facility-Administered Medications Ordered in Other Visits  Medication Dose Route Frequency Provider Last Rate Last Dose  . morphine 4 MG/ML injection 2 mg  2 mg Intravenous Q30 min PRN Susanne Borders, NP   2 mg at 11/23/14 1345    REVIEW OF SYSTEMS:  Constitutional: positive for fatigue Eyes: negative Ears, nose, mouth, throat, and face: negative Respiratory:  negative Cardiovascular: negative Gastrointestinal: negative Genitourinary:negative Integument/breast: negative Hematologic/lymphatic: negative Musculoskeletal:positive for back pain and bone pain Neurological: negative Behavioral/Psych: negative Endocrine: negative Allergic/Immunologic: negative   PHYSICAL EXAMINATION: General appearance: alert, cooperative and no distress Head: Normocephalic, without obvious abnormality, atraumatic Neck: no adenopathy, no JVD, supple, symmetrical, trachea midline and thyroid not enlarged, symmetric, no tenderness/mass/nodules Lymph nodes: Cervical, supraclavicular, and axillary nodes normal. Resp: clear to auscultation bilaterally Back: symmetric, no curvature. ROM normal. No CVA tenderness. Cardio: regular rate and rhythm, S1, S2 normal, no murmur, click, rub or gallop GI: soft, non-tender; bowel sounds normal; no masses,  no organomegaly Extremities: extremities normal, atraumatic, no cyanosis or edema Neurologic: Alert and  oriented X 3, normal strength and tone. Normal symmetric reflexes. Normal coordination and gait  ECOG PERFORMANCE STATUS: 2 - Symptomatic, <50% confined to bed  Blood pressure 111/63, pulse 109, temperature 98.9 F (37.2 C), temperature source Oral, resp. rate 19, height '6\' 2"'$  (1.88 m), weight 181 lb (82.101 kg), SpO2 93 %.  LABORATORY DATA: Lab Results  Component Value Date   WBC 3.5* 03/02/2015   HGB 8.4* 03/02/2015   HCT 25.4* 03/02/2015   MCV 96.2 03/02/2015   PLT 162 03/02/2015      Chemistry      Component Value Date/Time   NA 141 02/24/2015 0911   NA 138 02/15/2015 2148   K 4.0 02/24/2015 0911   K 3.7 02/15/2015 2148   CL 103 02/15/2015 2148   CO2 28 02/24/2015 0911   CO2 26 02/15/2015 2148   BUN 9.2 02/24/2015 0911   BUN 6 02/15/2015 2148   CREATININE 0.6* 02/24/2015 0911   CREATININE 0.53* 02/15/2015 2148      Component Value Date/Time   CALCIUM 8.9 02/24/2015 0911   CALCIUM 7.8* 02/15/2015 2148    ALKPHOS 257* 02/24/2015 0911   ALKPHOS 201* 02/13/2015 2203   AST 28 02/24/2015 0911   AST 56* 02/13/2015 2203   ALT 18 02/24/2015 0911   ALT 24 02/13/2015 2203   BILITOT 0.52 02/24/2015 0911   BILITOT 0.7 02/13/2015 2203       RADIOGRAPHIC STUDIES: Dg Chest 2 View  02/13/2015   CLINICAL DATA:  Patient with chest pain and shortness of breath. Started chemotherapy on Friday.  EXAM: CHEST  2 VIEW  COMPARISON:  Chest radiograph 08/23/2014  FINDINGS: Monitoring leads overlie the patient. Stable cardiac and mediastinal contours. Known nodules within the bilateral lower lobes are not as well demonstrated on current radiograph. No pleural effusion or pneumothorax. Re- demonstrated multiple mid thoracic spine compression deformities. Diffuse osseous metastatic disease involving the thoracic spine, ribs and left scapula.  IMPRESSION: No acute cardiopulmonary process.  Known pulmonary nodules are not as well demonstrated on current radiograph.  Diffuse osseous metastatic disease with multiple wedge compression deformities of the thoracic spine.   Electronically Signed   By: Lovey Newcomer M.D.   On: 02/13/2015 22:24   Ct Chest W Contrast  03/01/2015   CLINICAL DATA:  Small cell lung cancer followup.  EXAM: CT CHEST, ABDOMEN, AND PELVIS WITH CONTRAST  TECHNIQUE: Multidetector CT imaging of the chest, abdomen and pelvis was performed following the standard protocol during bolus administration of intravenous contrast.  CONTRAST:  134m OMNIPAQUE IOHEXOL 300 MG/ML  SOLN  COMPARISON:  01/17/2015  FINDINGS: CT CHEST FINDINGS  Mediastinum: Normal heart size. There is no pericardial effusion. The trachea appears patent and is midline. Normal appearance of the esophagus. No enlarged mediastinal or hilar lymph nodes. No axillary or supraclavicular adenopathy.  Lungs/Pleura: Tiny left pleural effusion versus pleural thickening noted. Pulmonary nodule in the left lower lobe measures 1 x 0.8 cm, image 32/series 4.  Previously 1.8 x 1.2 cm. The pulmonary nodule within the left lower lobe measures 1.8 by 1.3 cm, image number 39/ series 4. This is unchanged from previous exam. The index pulmonary nodule within the central right lower lobe measures 0.6 cm, image 37/series 4. Previously 0.9 cm. No new or progressive disease identified.  Musculoskeletal: Extensive bone metastases are again noted. Numerous pathologic fractures are identified within the thoracic and lumbar spine index lesion involving the T7 vertebra appears lytic measuring 2.2 cm, image 68 of series 603. Unchanged  from previous exam. T4 lesion appears similar to previous study, image 69 of series 603.  CT ABDOMEN AND PELVIS FINDINGS  Hepatobiliary: Multi focal low-attenuation lesions are noted within the liver compatible with metastatic disease. Index lesion within the right hepatic lobe measures 1.8 cm, image 64/series 2. Previously 1 cm. Index lesion within the dome of liver measures 8 mm, image 53/series 2. Previously this measured the same. 8 mm lesion along the dome of liver is unchanged, image 54/series 2. Inferior right hepatic lobe lesion measures 1.4 cm, image 71/series 2. Previously 1.3 cm. At a slightly lower level there is an enlarging lesion measuring 2.2 cm, image 75/series 2. Previously 7 mm. Multiple stones and sludge noted within the neck of gallbladder. No biliary dilatation.  Pancreas: Normal appearance of the pancreas.  Spleen: The spleen is unremarkable.  Adrenals/Urinary Tract: Normal appearance of the adrenal glands. Normal appearance of both kidneys. The urinary bladder is unremarkable.  Stomach/Bowel: The stomach and the small bowel loops are within normal limits in caliber. Normal appearance of the colon.  Vascular/Lymphatic: Calcified atherosclerotic disease involves the abdominal aorta. No aneurysm. Periaortic adenopathy is noted. Index lymph node measures 1.9 cm, image 71 of series 2. Previously 0.9 cm. Adjacent lymph node measures 1.4  cm, image 77/series 2. New from previous exam. No pelvic or inguinal adenopathy.  Reproductive: Prostate gland and seminal vesicles are unremarkable.  Other: There is no ascites or focal fluid collections within the abdomen or pelvis.  Musculoskeletal: Extensive lytic bone metastases are again identified. Index lesion at the L1 level measures 1.7 cm, image 58/series 2. Previously 1.6 cm. There is extensive tumor metastasis involving both sides of sacrum and both iliac bones. At this point and these may be of orthopedic significance. Index lesion within the right iliac bone measures 2.8 cm, image 103/series 2. Previously 2.5 cm. Left posterior iliac bone lesion measures 1.8 cm, image 98/ series 2. Previously 1.6 cm.  IMPRESSION: 1. Mixed response to therapy. 2. No significant change in pulmonary nodules. 3. Most of the liver metastasis are stable. There is at least 1 enlarging lesion within the inferior right lobe of liver. 4. Interval development of periaortic metastatic adenopathy. 5. Extensive lytic bone metastasis. Lytic lesions within the pelvis are mildly increased in size from previous exam. Lesions involving both sides of the sacrum are likely of orthopedic significance. 6. Aortic atherosclerosis. 7. Gallstones.   Electronically Signed   By: Kerby Moors M.D.   On: 03/01/2015 11:40   Ct Angio Chest Pe W/cm &/or Wo Cm  02/13/2015   CLINICAL DATA:  Patient with history of known lung cancer. On chemotherapy. Chest pain.  EXAM: CT ANGIOGRAPHY CHEST WITH CONTRAST  TECHNIQUE: Multidetector CT imaging of the chest was performed using the standard protocol during bolus administration of intravenous contrast. Multiplanar CT image reconstructions and MIPs were obtained to evaluate the vascular anatomy.  CONTRAST:  165m OMNIPAQUE IOHEXOL 350 MG/ML SOLN  COMPARISON:  CT 01/17/2015  FINDINGS: Mediastinum/Nodes: Visualized thyroid is unremarkable. No enlarged axillary, mediastinal or hilar lymphadenopathy. Normal  heart size. No pericardial effusion. Coronary arterial vascular calcifications.  Adequate opacification of the main pulmonary artery. Motion artifact limits evaluation of the pulmonary arteries particularly within the left lower lobe. Within the above limitation no definite evidence for pulmonary embolism.  Lungs/Pleura: Central airways are patent. Interval development of peribronchovascular ground-glass nodularity involving the right upper and right lower lobes. The previously described left lower lobe nodules are stable including a 1.3 x 1.5 cm  nodule (image 43; series 7), previously 1.8 x 1.2 cm and min additional 1.3 x 1.5 cm nodule (image 56; series 7), previously 1.7 x 1.3 cm. No pleural effusion or pneumothorax.  Upper abdomen: Re- demonstrated multiple low-attenuation lesions throughout the liver compatible with known hepatic metastatic disease.  Musculoskeletal: Re- demonstrated diffuse osseous metastatic disease involving the proximal humerus bilaterally, left clavicle, left scapula, thoracic spine and multiple ribs. Multiple healing rib fractures are demonstrated bilaterally. Re- demonstrated multiple thoracic spine compression deformities, grossly similar when compared to prior CT and MRI.  Review of the MIP images confirms the above findings.  IMPRESSION: 1. No definite evidence for pulmonary embolism. Evaluation limited within the left lower lobe due to motion artifact. 2. Interval development of peribronchovascular ground-glass nodularity predominately within the right upper and right lower lobes. Findings are nonspecific however may represent an acute infectious/inflammatory process or potentially drug reaction. 3. Findings compatible with metastatic disease involving the lungs, liver and bones.   Electronically Signed   By: Lovey Newcomer M.D.   On: 02/13/2015 23:43   Ct Abdomen Pelvis W Contrast  03/01/2015   CLINICAL DATA:  Small cell lung cancer followup.  EXAM: CT CHEST, ABDOMEN, AND PELVIS WITH  CONTRAST  TECHNIQUE: Multidetector CT imaging of the chest, abdomen and pelvis was performed following the standard protocol during bolus administration of intravenous contrast.  CONTRAST:  140m OMNIPAQUE IOHEXOL 300 MG/ML  SOLN  COMPARISON:  01/17/2015  FINDINGS: CT CHEST FINDINGS  Mediastinum: Normal heart size. There is no pericardial effusion. The trachea appears patent and is midline. Normal appearance of the esophagus. No enlarged mediastinal or hilar lymph nodes. No axillary or supraclavicular adenopathy.  Lungs/Pleura: Tiny left pleural effusion versus pleural thickening noted. Pulmonary nodule in the left lower lobe measures 1 x 0.8 cm, image 32/series 4. Previously 1.8 x 1.2 cm. The pulmonary nodule within the left lower lobe measures 1.8 by 1.3 cm, image number 39/ series 4. This is unchanged from previous exam. The index pulmonary nodule within the central right lower lobe measures 0.6 cm, image 37/series 4. Previously 0.9 cm. No new or progressive disease identified.  Musculoskeletal: Extensive bone metastases are again noted. Numerous pathologic fractures are identified within the thoracic and lumbar spine index lesion involving the T7 vertebra appears lytic measuring 2.2 cm, image 68 of series 603. Unchanged from previous exam. T4 lesion appears similar to previous study, image 69 of series 603.  CT ABDOMEN AND PELVIS FINDINGS  Hepatobiliary: Multi focal low-attenuation lesions are noted within the liver compatible with metastatic disease. Index lesion within the right hepatic lobe measures 1.8 cm, image 64/series 2. Previously 1 cm. Index lesion within the dome of liver measures 8 mm, image 53/series 2. Previously this measured the same. 8 mm lesion along the dome of liver is unchanged, image 54/series 2. Inferior right hepatic lobe lesion measures 1.4 cm, image 71/series 2. Previously 1.3 cm. At a slightly lower level there is an enlarging lesion measuring 2.2 cm, image 75/series 2. Previously 7  mm. Multiple stones and sludge noted within the neck of gallbladder. No biliary dilatation.  Pancreas: Normal appearance of the pancreas.  Spleen: The spleen is unremarkable.  Adrenals/Urinary Tract: Normal appearance of the adrenal glands. Normal appearance of both kidneys. The urinary bladder is unremarkable.  Stomach/Bowel: The stomach and the small bowel loops are within normal limits in caliber. Normal appearance of the colon.  Vascular/Lymphatic: Calcified atherosclerotic disease involves the abdominal aorta. No aneurysm. Periaortic adenopathy is noted. Index  lymph node measures 1.9 cm, image 71 of series 2. Previously 0.9 cm. Adjacent lymph node measures 1.4 cm, image 77/series 2. New from previous exam. No pelvic or inguinal adenopathy.  Reproductive: Prostate gland and seminal vesicles are unremarkable.  Other: There is no ascites or focal fluid collections within the abdomen or pelvis.  Musculoskeletal: Extensive lytic bone metastases are again identified. Index lesion at the L1 level measures 1.7 cm, image 58/series 2. Previously 1.6 cm. There is extensive tumor metastasis involving both sides of sacrum and both iliac bones. At this point and these may be of orthopedic significance. Index lesion within the right iliac bone measures 2.8 cm, image 103/series 2. Previously 2.5 cm. Left posterior iliac bone lesion measures 1.8 cm, image 98/ series 2. Previously 1.6 cm.  IMPRESSION: 1. Mixed response to therapy. 2. No significant change in pulmonary nodules. 3. Most of the liver metastasis are stable. There is at least 1 enlarging lesion within the inferior right lobe of liver. 4. Interval development of periaortic metastatic adenopathy. 5. Extensive lytic bone metastasis. Lytic lesions within the pelvis are mildly increased in size from previous exam. Lesions involving both sides of the sacrum are likely of orthopedic significance. 6. Aortic atherosclerosis. 7. Gallstones.   Electronically Signed   By: Kerby Moors M.D.   On: 03/01/2015 11:40   Dg Chest Port 1 View  02/15/2015   CLINICAL DATA:  Chest pain, left-sided.  History of lung carcinoma  EXAM: PORTABLE CHEST - 1 VIEW  COMPARISON:  Chest radiograph and chest CT February 13, 2015  FINDINGS: There is no edema or consolidation. There is a nodular opacity in the left lower lobe which corresponds to a nodular lesions seen on recent chest CT. This nodular lesion measures 1.4 x 1.0 cm. Heart size and pulmonary vascularity are normal. No adenopathy. There are areas of bony metastatic disease, better seen on CT. There is fragmentation along the superior, lateral left clavicle, also seen on recent CT.  IMPRESSION: Pulmonary nodular lesion left lower lobe. Areas of bony metastatic disease, better appreciable on recent chest CT. No edema or consolidation.   Electronically Signed   By: Lowella Grip III M.D.   On: 02/15/2015 21:45    ASSESSMENT AND PLAN: this is a very pleasant 54 years old white male recently diagnosed with extensive stage small cell lung cancer status post 6 cycles of systemic chemotherapy with carboplatin and etoposide with partial response. This was followed by prophylactic cranial irradiation in addition to palliative radiotherapy to the T7 thoracic spine and the chest. Restaging CT scan of the Chest, Abdomen and pelvis showed evidence for further disease progression with progressive osseous metastasis and tumor canal encroachment involving the thoracic spine as well as development of hepatic metastasis and nodal metastasis within the upper abdomen. He is currently undergoing systemic chemotherapy with cisplatin and irinotecan is status post 3 cycles. He is tolerating the treatment well. Repeat CT scan of the chest, abdomen and pelvis showed mixed response with a stable pulmonary nodules as well as liver lesion except for enlarging right lobe of the liver lesion as well as development of periaortic metastatic adenopathy. I discussed the scan  results with the patient today. I recommended for him to proceed with 3 more cycles of the same treatment regimen for now. He will start cycle #4 tomorrow. For pain management, he was advised to take OxyContin as scheduled and oxycodone only for breakthrough pain.  For the chemotherapy-induced anemia, we will continue to monitor  his hemoglobin and hematocrit closely and consider the patient for PRBCs transfusion if his hemoglobin is less than 8 g.0/dL. The patient would come back for follow-up visit in 3 weeks for reevaluation before starting cycle #5. The patient was advised to call immediately if he has any concerning symptoms in the interval. The patient voices understanding of current disease status and treatment options and is in agreement with the current care plan.  All questions were answered. The patient knows to call the clinic with any problems, questions or concerns. We can certainly see the patient much sooner if necessary.  Disclaimer: This note was dictated with voice recognition software. Similar sounding words can inadvertently be transcribed and may not be corrected upon review.

## 2015-03-02 NOTE — Telephone Encounter (Signed)
Per staff message and POF I have scheduled appts. Advised scheduler of appts. JMW  

## 2015-03-03 ENCOUNTER — Ambulatory Visit: Payer: Federal, State, Local not specified - PPO | Admitting: Nutrition

## 2015-03-03 ENCOUNTER — Ambulatory Visit (HOSPITAL_BASED_OUTPATIENT_CLINIC_OR_DEPARTMENT_OTHER): Payer: Federal, State, Local not specified - PPO

## 2015-03-03 ENCOUNTER — Other Ambulatory Visit: Payer: Self-pay

## 2015-03-03 VITALS — BP 90/69 | HR 103 | Temp 99.5°F | Resp 18

## 2015-03-03 DIAGNOSIS — T451X5A Adverse effect of antineoplastic and immunosuppressive drugs, initial encounter: Secondary | ICD-10-CM

## 2015-03-03 DIAGNOSIS — C7A1 Malignant poorly differentiated neuroendocrine tumors: Secondary | ICD-10-CM

## 2015-03-03 DIAGNOSIS — Z5111 Encounter for antineoplastic chemotherapy: Secondary | ICD-10-CM

## 2015-03-03 DIAGNOSIS — C3490 Malignant neoplasm of unspecified part of unspecified bronchus or lung: Secondary | ICD-10-CM

## 2015-03-03 DIAGNOSIS — C7B8 Other secondary neuroendocrine tumors: Secondary | ICD-10-CM | POA: Diagnosis not present

## 2015-03-03 DIAGNOSIS — C7951 Secondary malignant neoplasm of bone: Secondary | ICD-10-CM

## 2015-03-03 DIAGNOSIS — R1310 Dysphagia, unspecified: Secondary | ICD-10-CM

## 2015-03-03 DIAGNOSIS — R790 Abnormal level of blood mineral: Secondary | ICD-10-CM

## 2015-03-03 DIAGNOSIS — D6481 Anemia due to antineoplastic chemotherapy: Secondary | ICD-10-CM

## 2015-03-03 MED ORDER — OXYCODONE HCL 10 MG PO TABS: 10.0000 mg | ORAL_TABLET | ORAL | Status: DC | PRN

## 2015-03-03 MED ORDER — ACETAMINOPHEN 325 MG PO TABS
650.0000 mg | ORAL_TABLET | Freq: Once | ORAL | Status: AC
Start: 2015-03-03 — End: 2015-03-03
  Administered 2015-03-03: 650 mg via ORAL

## 2015-03-03 MED ORDER — DEXTROSE 5 % IV SOLN
65.0000 mg/m2 | Freq: Once | INTRAVENOUS | Status: AC
  Administered 2015-03-03: 140 mg via INTRAVENOUS
  Filled 2015-03-03: qty 7

## 2015-03-03 MED ORDER — ATROPINE SULFATE 1 MG/ML IJ SOLN
INTRAMUSCULAR | Status: AC
  Filled 2015-03-03: qty 1

## 2015-03-03 MED ORDER — SODIUM CHLORIDE 0.9 % IV SOLN
Freq: Once | INTRAVENOUS | Status: AC
  Administered 2015-03-03: 09:00:00 via INTRAVENOUS

## 2015-03-03 MED ORDER — POTASSIUM CHLORIDE 2 MEQ/ML IV SOLN
Freq: Once | INTRAVENOUS | Status: AC
  Administered 2015-03-03: 09:00:00 via INTRAVENOUS
  Filled 2015-03-03: qty 1000

## 2015-03-03 MED ORDER — ACETAMINOPHEN 325 MG PO TABS
ORAL_TABLET | ORAL | Status: AC
  Filled 2015-03-03: qty 2

## 2015-03-03 MED ORDER — PALONOSETRON HCL INJECTION 0.25 MG/5ML
0.2500 mg | Freq: Once | INTRAVENOUS | Status: AC
  Administered 2015-03-03: 0.25 mg via INTRAVENOUS

## 2015-03-03 MED ORDER — ATROPINE SULFATE 1 MG/ML IJ SOLN
0.5000 mg | Freq: Once | INTRAMUSCULAR | Status: AC | PRN
  Administered 2015-03-03: 0.5 mg via INTRAVENOUS

## 2015-03-03 MED ORDER — POTASSIUM CHLORIDE 2 MEQ/ML IV SOLN: Freq: Once | INTRAVENOUS | Status: DC

## 2015-03-03 MED ORDER — MAGNESIUM OXIDE 400 (241.3 MG) MG PO TABS
3.0000 | ORAL_TABLET | Freq: Three times a day (TID) | ORAL | Status: DC
Start: 2015-03-03 — End: 2015-04-10

## 2015-03-03 MED ORDER — CISPLATIN CHEMO INJECTION 100MG/100ML
30.0000 mg/m2 | Freq: Once | INTRAVENOUS | Status: AC
  Administered 2015-03-03: 65 mg via INTRAVENOUS
  Filled 2015-03-03: qty 65

## 2015-03-03 MED ORDER — SODIUM CHLORIDE 0.9 % IV SOLN
Freq: Once | INTRAVENOUS | Status: AC
  Administered 2015-03-03: 11:00:00 via INTRAVENOUS
  Filled 2015-03-03: qty 5

## 2015-03-03 MED ORDER — PALONOSETRON HCL INJECTION 0.25 MG/5ML
INTRAVENOUS | Status: AC
  Filled 2015-03-03: qty 5

## 2015-03-03 NOTE — Progress Notes (Signed)
Nutrition follow-up completed with patient during chemotherapy for metastatic small cell lung cancer. Weight documented as 181 pounds on July 7. Patient's bilateral lower leg swelling has improved, however, he states his left lower leg is still somewhat swollen. Patient is eating well. He has no questions or concerns.  Nutrition diagnosis: Severe malnutrition, improved.  Intervention: Enforced importance of continuing small frequent meals with high protein foods. Recommended patient continue oral nutrition supplements 2 to 3 times daily as needed. Provided coupons for ensure, boost and Carnation breakfast essentials. Questions were answered.  Teach back method used.  Monitoring, evaluation, goals: Patient will tolerate adequate calories and protein to support maintenance of lean body mass.  Next visit: Patient has my contact information for questions or concerns.  **Disclaimer: This note was dictated with voice recognition software. Similar sounding words can inadvertently be transcribed and this note may contain transcription errors which may not have been corrected upon publication of note.**

## 2015-03-03 NOTE — Patient Instructions (Signed)
[facility] Discharge Instructions for Patients Receiving Chemotherapy  Today you received the following chemotherapy agents prehydration, camptosar, and cisplatin  To help prevent nausea and vomiting after your treatment, we encourage you to take your nausea medication if needed   If you develop nausea and vomiting that is not controlled by your nausea medication, call the clinic.   BELOW ARE SYMPTOMS THAT SHOULD BE REPORTED IMMEDIATELY:  *FEVER GREATER THAN 100.5 F  *CHILLS WITH OR WITHOUT FEVER  NAUSEA AND VOMITING THAT IS NOT CONTROLLED WITH YOUR NAUSEA MEDICATION  *UNUSUAL SHORTNESS OF BREATH  *UNUSUAL BRUISING OR BLEEDING  TENDERNESS IN MOUTH AND THROAT WITH OR WITHOUT PRESENCE OF ULCERS  *URINARY PROBLEMS  *BOWEL PROBLEMS  UNUSUAL RASH Items with * indicate a potential emergency and should be followed up as soon as possible.  Feel free to call the clinic you have any questions or concerns. The clinic phone number is [phone].  Please show the [facility] at check-in to the Emergency Department and triage nurse.

## 2015-03-03 NOTE — Progress Notes (Signed)
OK to treat w/ANC 1.4 and temp 99.5 per Dr Julien Nordmann. 2 tylenol po for temp

## 2015-03-10 ENCOUNTER — Ambulatory Visit (HOSPITAL_COMMUNITY)
Admission: RE | Admit: 2015-03-10 | Discharge: 2015-03-10 | Disposition: A | Discharge status: Home / Self Care | Payer: Federal, State, Local not specified - PPO | Source: Ambulatory Visit | Attending: Internal Medicine | Admitting: Internal Medicine

## 2015-03-10 ENCOUNTER — Other Ambulatory Visit (HOSPITAL_BASED_OUTPATIENT_CLINIC_OR_DEPARTMENT_OTHER): Payer: Federal, State, Local not specified - PPO

## 2015-03-10 ENCOUNTER — Ambulatory Visit (HOSPITAL_BASED_OUTPATIENT_CLINIC_OR_DEPARTMENT_OTHER): Payer: Federal, State, Local not specified - PPO

## 2015-03-10 VITALS — BP 148/88 | HR 84 | Temp 98.3°F | Resp 18

## 2015-03-10 DIAGNOSIS — T451X5A Adverse effect of antineoplastic and immunosuppressive drugs, initial encounter: Secondary | ICD-10-CM

## 2015-03-10 DIAGNOSIS — Z5111 Encounter for antineoplastic chemotherapy: Secondary | ICD-10-CM | POA: Diagnosis not present

## 2015-03-10 DIAGNOSIS — C3490 Malignant neoplasm of unspecified part of unspecified bronchus or lung: Secondary | ICD-10-CM

## 2015-03-10 DIAGNOSIS — C7A1 Malignant poorly differentiated neuroendocrine tumors: Secondary | ICD-10-CM

## 2015-03-10 DIAGNOSIS — D6481 Anemia due to antineoplastic chemotherapy: Secondary | ICD-10-CM

## 2015-03-10 DIAGNOSIS — R1310 Dysphagia, unspecified: Secondary | ICD-10-CM

## 2015-03-10 DIAGNOSIS — C7B8 Other secondary neuroendocrine tumors: Secondary | ICD-10-CM | POA: Diagnosis not present

## 2015-03-10 DIAGNOSIS — C7951 Secondary malignant neoplasm of bone: Secondary | ICD-10-CM

## 2015-03-10 LAB — CBC WITH DIFFERENTIAL/PLATELET
BASO%: 0.5 % (ref 0.0–2.0)
BASOS ABS: 0 10*3/uL (ref 0.0–0.1)
EOS ABS: 0 10*3/uL (ref 0.0–0.5)
EOS%: 1.7 % (ref 0.0–7.0)
HCT: 23.2 % — ABNORMAL LOW (ref 38.4–49.9)
HEMOGLOBIN: 7.9 g/dL — AB (ref 13.0–17.1)
LYMPH%: 28.2 % (ref 14.0–49.0)
MCH: 31.6 pg (ref 27.2–33.4)
MCHC: 34 g/dL (ref 32.0–36.0)
MCV: 92.8 fL (ref 79.3–98.0)
MONO#: 0.4 10*3/uL (ref 0.1–0.9)
MONO%: 12.7 % (ref 0.0–14.0)
NEUT#: 1.6 10*3/uL (ref 1.5–6.5)
NEUT%: 56.9 % (ref 39.0–75.0)
PLATELETS: 155 10*3/uL (ref 140–400)
RBC: 2.5 10*6/uL — ABNORMAL LOW (ref 4.20–5.82)
RDW: 19.6 % — ABNORMAL HIGH (ref 11.0–14.6)
WBC: 2.9 10*3/uL — ABNORMAL LOW (ref 4.0–10.3)
lymph#: 0.8 10*3/uL — ABNORMAL LOW (ref 0.9–3.3)

## 2015-03-10 LAB — COMPREHENSIVE METABOLIC PANEL (CC13)
ALT: 14 U/L (ref 0–55)
AST: 23 U/L (ref 5–34)
Albumin: 2.8 g/dL — ABNORMAL LOW (ref 3.5–5.0)
Alkaline Phosphatase: 245 U/L — ABNORMAL HIGH (ref 40–150)
Anion Gap: 7 mEq/L (ref 3–11)
BILIRUBIN TOTAL: 0.44 mg/dL (ref 0.20–1.20)
BUN: 8 mg/dL (ref 7.0–26.0)
CALCIUM: 9.2 mg/dL (ref 8.4–10.4)
CHLORIDE: 105 meq/L (ref 98–109)
CO2: 28 mEq/L (ref 22–29)
CREATININE: 0.6 mg/dL — AB (ref 0.7–1.3)
EGFR: >90 mL/min/{1.73_m2} (ref >90)
Glucose: 123 mg/dl (ref 70–140)
POTASSIUM: 3.9 meq/L (ref 3.5–5.1)
SODIUM: 140 meq/L (ref 136–145)
Total Protein: 6.2 g/dL — ABNORMAL LOW (ref 6.4–8.3)

## 2015-03-10 LAB — MAGNESIUM (CC13): MAGNESIUM: 1.6 mg/dL (ref 1.5–2.5)

## 2015-03-10 LAB — PREPARE RBC (CROSSMATCH)

## 2015-03-10 MED ORDER — PALONOSETRON HCL INJECTION 0.25 MG/5ML
0.2500 mg | Freq: Once | INTRAVENOUS | Status: AC
  Administered 2015-03-10: 0.25 mg via INTRAVENOUS

## 2015-03-10 MED ORDER — DIPHENHYDRAMINE HCL 25 MG PO CAPS
25.0000 mg | ORAL_CAPSULE | Freq: Once | ORAL | Status: AC
  Administered 2015-03-10: 25 mg via ORAL

## 2015-03-10 MED ORDER — DEXTROSE 5 % IV SOLN
65.0000 mg/m2 | Freq: Once | INTRAVENOUS | Status: AC
  Administered 2015-03-10: 140 mg via INTRAVENOUS
  Filled 2015-03-10: qty 7

## 2015-03-10 MED ORDER — PALONOSETRON HCL INJECTION 0.25 MG/5ML
INTRAVENOUS | Status: AC
  Filled 2015-03-10: qty 5

## 2015-03-10 MED ORDER — ACETAMINOPHEN 325 MG PO TABS
650.0000 mg | ORAL_TABLET | Freq: Once | ORAL | Status: AC
  Administered 2015-03-10: 650 mg via ORAL

## 2015-03-10 MED ORDER — ATROPINE SULFATE 1 MG/ML IJ SOLN
0.5000 mg | Freq: Once | INTRAMUSCULAR | Status: AC | PRN
  Administered 2015-03-10: 0.5 mg via INTRAVENOUS

## 2015-03-10 MED ORDER — SODIUM CHLORIDE 0.9 % IV SOLN
30.0000 mg/m2 | Freq: Once | INTRAVENOUS | Status: AC
  Administered 2015-03-10: 65 mg via INTRAVENOUS
  Filled 2015-03-10: qty 65

## 2015-03-10 MED ORDER — DIPHENHYDRAMINE HCL 25 MG PO CAPS
ORAL_CAPSULE | ORAL | Status: AC
  Filled 2015-03-10: qty 1

## 2015-03-10 MED ORDER — SODIUM CHLORIDE 0.9 % IV SOLN
250.0000 mL | Freq: Once | INTRAVENOUS | Status: AC
  Administered 2015-03-10: 250 mL via INTRAVENOUS

## 2015-03-10 MED ORDER — FOSAPREPITANT DIMEGLUMINE INJECTION 150 MG
Freq: Once | INTRAVENOUS | Status: AC
  Administered 2015-03-10: 13:00:00 via INTRAVENOUS
  Filled 2015-03-10: qty 5

## 2015-03-10 MED ORDER — SODIUM CHLORIDE 0.9 % IV SOLN: Freq: Once | INTRAVENOUS | Status: DC

## 2015-03-10 MED ORDER — ACETAMINOPHEN 325 MG PO TABS
ORAL_TABLET | ORAL | Status: AC
  Filled 2015-03-10: qty 2

## 2015-03-10 MED ORDER — POTASSIUM CHLORIDE 2 MEQ/ML IV SOLN
Freq: Once | INTRAVENOUS | Status: AC
  Administered 2015-03-10: 10:00:00 via INTRAVENOUS
  Filled 2015-03-10: qty 10

## 2015-03-10 MED ORDER — ATROPINE SULFATE 1 MG/ML IJ SOLN
INTRAMUSCULAR | Status: AC
  Filled 2015-03-10: qty 1

## 2015-03-10 NOTE — Progress Notes (Signed)
Per Awilda Metro, PA ok for pt to receive chemo in addition to blood.  Per Awilda Metro, PA ok to get one unit of blood and chemo today and one unit of blood tomorrow.  Amy E. RN in infusion notified and verbalizes understanding.

## 2015-03-10 NOTE — Progress Notes (Signed)
Per Adrena okay to proceed with treatment with labs today 03/10/15. Pt is to receive two units of blood.   1250: At this time pt has had 1/2 liter of fluids, one unit of blood and 6 cups of water. Creatine 0.6, pt has voided 25cc of urine. Dr. Julien Nordmann aware and orders to proceed with treatment.

## 2015-03-10 NOTE — Patient Instructions (Signed)
[facility] Discharge Instructions for Patients Receiving Chemotherapy  Today you received the following chemotherapy agents: Cisplatin, Irinotecan   To help prevent nausea and vomiting after your treatment, we encourage you to take your nausea medication as directed.    If you develop nausea and vomiting that is not controlled by your nausea medication, call the clinic.   BELOW ARE SYMPTOMS THAT SHOULD BE REPORTED IMMEDIATELY:  *FEVER GREATER THAN 100.5 F  *CHILLS WITH OR WITHOUT FEVER  NAUSEA AND VOMITING THAT IS NOT CONTROLLED WITH YOUR NAUSEA MEDICATION  *UNUSUAL SHORTNESS OF BREATH  *UNUSUAL BRUISING OR BLEEDING  TENDERNESS IN MOUTH AND THROAT WITH OR WITHOUT PRESENCE OF ULCERS  *URINARY PROBLEMS  *BOWEL PROBLEMS  UNUSUAL RASH Items with * indicate a potential emergency and should be followed up as soon as possible.  Feel free to call the clinic you have any questions or concerns. The clinic phone number is [phone].  Please show the [facility] at check-in to the Emergency Department and triage nurse.     Blood Transfusion Information WHAT IS A BLOOD TRANSFUSION? A transfusion is the replacement of blood or some of its parts. Blood is made up of multiple cells which provide different functions.  Red blood cells carry oxygen and are used for blood loss replacement.  White blood cells fight against infection.  Platelets control bleeding.  Plasma helps clot blood.  Other blood products are available for specialized needs, such as hemophilia or other clotting disorders. BEFORE THE TRANSFUSION  Who gives blood for transfusions?   You may be able to donate blood to be used at a later date on yourself (autologous donation).  Relatives can be asked to donate blood. This is generally not any safer than if you have received blood from a stranger. The same precautions are taken to ensure safety when a relative's blood is donated.  Healthy  volunteers who are fully evaluated to make sure their blood is safe. This is blood bank blood. Transfusion therapy is the safest it has ever been in the practice of medicine. Before blood is taken from a donor, a complete history is taken to make sure that person has no history of diseases nor engages in risky social behavior (examples are intravenous drug use or sexual activity with multiple partners). The donor's travel history is screened to minimize risk of transmitting infections, such as malaria. The donated blood is tested for signs of infectious diseases, such as HIV and hepatitis. The blood is then tested to be sure it is compatible with you in order to minimize the chance of a transfusion reaction. If you or a relative donates blood, this is often done in anticipation of surgery and is not appropriate for emergency situations. It takes many days to process the donated blood. RISKS AND COMPLICATIONS Although transfusion therapy is very safe and saves many lives, the main dangers of transfusion include:   Getting an infectious disease.  Developing a transfusion reaction. This is an allergic reaction to something in the blood you were given. Every precaution is taken to prevent this. The decision to have a blood transfusion has been considered carefully by your caregiver before blood is given. Blood is not given unless the benefits outweigh the risks. AFTER THE TRANSFUSION  Right after receiving a blood transfusion, you will usually feel much better and more energetic. This is especially true if your red blood cells have gotten low (anemic). The transfusion raises the level of the red blood  cells which carry oxygen, and this usually causes an energy increase.  The nurse administering the transfusion will monitor you carefully for complications. HOME CARE INSTRUCTIONS  No special instructions are needed after a transfusion. You may find your energy is better. Speak with your caregiver about any  limitations on activity for underlying diseases you may have. SEEK MEDICAL CARE IF:   Your condition is not improving after your transfusion.  You develop redness or irritation at the intravenous (IV) site. SEEK IMMEDIATE MEDICAL CARE IF:  Any of the following symptoms occur over the next 12 hours:  Shaking chills.  You have a temperature by mouth above 102 F (38.9 C), not controlled by medicine.  Chest, back, or muscle pain.  People around you feel you are not acting correctly or are confused.  Shortness of breath or difficulty breathing.  Dizziness and fainting.  You get a rash or develop hives.  You have a decrease in urine output.  Your urine turns a dark color or changes to pink, red, or brown. Any of the following symptoms occur over the next 10 days:  You have a temperature by mouth above 102 F (38.9 C), not controlled by medicine.  Shortness of breath.  Weakness after normal activity.  The white part of the eye turns yellow (jaundice).  You have a decrease in the amount of urine or are urinating less often.  Your urine turns a dark color or changes to pink, red, or brown. Document Released: 08/09/2000 Document Revised: 11/04/2011 Document Reviewed: 03/28/2008 [facility] Patient Information 2015 [place], [place]. This information is not intended to replace advice given to you by your health care provider. Make sure you discuss any questions you have with your health care provider.

## 2015-03-11 ENCOUNTER — Ambulatory Visit (HOSPITAL_BASED_OUTPATIENT_CLINIC_OR_DEPARTMENT_OTHER): Payer: Federal, State, Local not specified - PPO

## 2015-03-11 VITALS — BP 134/75 | HR 84 | Temp 97.9°F | Resp 18

## 2015-03-11 DIAGNOSIS — D6481 Anemia due to antineoplastic chemotherapy: Secondary | ICD-10-CM | POA: Diagnosis not present

## 2015-03-11 DIAGNOSIS — C3490 Malignant neoplasm of unspecified part of unspecified bronchus or lung: Secondary | ICD-10-CM

## 2015-03-11 MED ORDER — DIPHENHYDRAMINE HCL 25 MG PO CAPS
25.0000 mg | ORAL_CAPSULE | Freq: Once | ORAL | Status: AC
  Administered 2015-03-11: 25 mg via ORAL

## 2015-03-11 MED ORDER — ACETAMINOPHEN 325 MG PO TABS
ORAL_TABLET | ORAL | Status: AC
  Filled 2015-03-11: qty 2

## 2015-03-11 MED ORDER — SODIUM CHLORIDE 0.9 % IV SOLN
250.0000 mL | Freq: Once | INTRAVENOUS | Status: AC
  Administered 2015-03-11: 250 mL via INTRAVENOUS

## 2015-03-11 MED ORDER — DIPHENHYDRAMINE HCL 25 MG PO CAPS
ORAL_CAPSULE | ORAL | Status: AC
  Filled 2015-03-11: qty 1

## 2015-03-11 MED ORDER — ACETAMINOPHEN 325 MG PO TABS
650.0000 mg | ORAL_TABLET | Freq: Once | ORAL | Status: AC
  Administered 2015-03-11: 650 mg via ORAL

## 2015-03-11 NOTE — Patient Instructions (Signed)

## 2015-03-13 DIAGNOSIS — C3490 Malignant neoplasm of unspecified part of unspecified bronchus or lung: Secondary | ICD-10-CM | POA: Diagnosis not present

## 2015-03-13 LAB — TYPE AND SCREEN
ABO/RH(D): O POS
ANTIBODY SCREEN: NEGATIVE
UNIT DIVISION: 0
UNIT DIVISION: 0
Unit division: 0

## 2015-03-14 ENCOUNTER — Telehealth: Payer: Self-pay

## 2015-03-14 DIAGNOSIS — R1310 Dysphagia, unspecified: Secondary | ICD-10-CM

## 2015-03-14 DIAGNOSIS — D6481 Anemia due to antineoplastic chemotherapy: Secondary | ICD-10-CM

## 2015-03-14 DIAGNOSIS — C7951 Secondary malignant neoplasm of bone: Secondary | ICD-10-CM

## 2015-03-14 DIAGNOSIS — C3490 Malignant neoplasm of unspecified part of unspecified bronchus or lung: Secondary | ICD-10-CM

## 2015-03-14 DIAGNOSIS — T451X5A Adverse effect of antineoplastic and immunosuppressive drugs, initial encounter: Secondary | ICD-10-CM

## 2015-03-14 MED ORDER — OXYCODONE HCL ER 60 MG PO T12A: EXTENDED_RELEASE_TABLET | ORAL | Status: DC

## 2015-03-14 MED ORDER — OXYCODONE HCL 10 MG PO TABS: 10.0000 mg | ORAL_TABLET | ORAL | Status: DC | PRN

## 2015-03-14 NOTE — Telephone Encounter (Signed)
Rx ready for pick up -pt notified. rx locked up.

## 2015-03-14 NOTE — Addendum Note (Signed)
Addended by: Ardeen Garland on: 03/14/2015 02:15 PM   Modules accepted: Orders, Medications

## 2015-03-14 NOTE — Telephone Encounter (Signed)
Pt called requesting oxycodone 10 mg and 60 mg refills.

## 2015-03-17 ENCOUNTER — Other Ambulatory Visit (HOSPITAL_BASED_OUTPATIENT_CLINIC_OR_DEPARTMENT_OTHER): Payer: Federal, State, Local not specified - PPO

## 2015-03-17 ENCOUNTER — Encounter: Payer: Self-pay | Admitting: Physician Assistant

## 2015-03-17 ENCOUNTER — Telehealth: Payer: Self-pay | Admitting: Physician Assistant

## 2015-03-17 ENCOUNTER — Other Ambulatory Visit: Payer: Self-pay | Admitting: *Deleted

## 2015-03-17 ENCOUNTER — Ambulatory Visit (HOSPITAL_BASED_OUTPATIENT_CLINIC_OR_DEPARTMENT_OTHER): Payer: Federal, State, Local not specified - PPO | Admitting: Physician Assistant

## 2015-03-17 VITALS — BP 120/73 | HR 94 | Temp 98.5°F | Resp 18 | Ht 74.0 in | Wt 170.3 lb

## 2015-03-17 DIAGNOSIS — T451X5A Adverse effect of antineoplastic and immunosuppressive drugs, initial encounter: Secondary | ICD-10-CM

## 2015-03-17 DIAGNOSIS — K625 Hemorrhage of anus and rectum: Secondary | ICD-10-CM | POA: Diagnosis not present

## 2015-03-17 DIAGNOSIS — C7951 Secondary malignant neoplasm of bone: Secondary | ICD-10-CM

## 2015-03-17 DIAGNOSIS — C7A1 Malignant poorly differentiated neuroendocrine tumors: Secondary | ICD-10-CM

## 2015-03-17 DIAGNOSIS — C7B8 Other secondary neuroendocrine tumors: Secondary | ICD-10-CM

## 2015-03-17 DIAGNOSIS — C3490 Malignant neoplasm of unspecified part of unspecified bronchus or lung: Secondary | ICD-10-CM

## 2015-03-17 DIAGNOSIS — D6481 Anemia due to antineoplastic chemotherapy: Secondary | ICD-10-CM

## 2015-03-17 DIAGNOSIS — R1310 Dysphagia, unspecified: Secondary | ICD-10-CM

## 2015-03-17 LAB — COMPREHENSIVE METABOLIC PANEL (CC13)
ALK PHOS: 211 U/L — AB (ref 40–150)
ALT: 10 U/L (ref 0–55)
ANION GAP: 9 meq/L (ref 3–11)
AST: 18 U/L (ref 5–34)
Albumin: 3 g/dL — ABNORMAL LOW (ref 3.5–5.0)
BILIRUBIN TOTAL: 0.48 mg/dL (ref 0.20–1.20)
BUN: 9.6 mg/dL (ref 7.0–26.0)
CALCIUM: 9.1 mg/dL (ref 8.4–10.4)
CHLORIDE: 105 meq/L (ref 98–109)
CO2: 25 mEq/L (ref 22–29)
Creatinine: 0.6 mg/dL — ABNORMAL LOW (ref 0.7–1.3)
EGFR: >90 mL/min/{1.73_m2} (ref >90)
Glucose: 118 mg/dl (ref 70–140)
Potassium: 4 mEq/L (ref 3.5–5.1)
SODIUM: 140 meq/L (ref 136–145)
Total Protein: 6.1 g/dL — ABNORMAL LOW (ref 6.4–8.3)

## 2015-03-17 LAB — CBC WITH DIFFERENTIAL/PLATELET
BASO%: 0.3 % (ref 0.0–2.0)
Basophils Absolute: 0 10*3/uL (ref 0.0–0.1)
EOS ABS: 0.1 10*3/uL (ref 0.0–0.5)
EOS%: 1.7 % (ref 0.0–7.0)
HCT: 26.6 % — ABNORMAL LOW (ref 38.4–49.9)
HGB: 9 g/dL — ABNORMAL LOW (ref 13.0–17.1)
LYMPH#: 0.7 10*3/uL — AB (ref 0.9–3.3)
LYMPH%: 24.3 % (ref 14.0–49.0)
MCH: 31.6 pg (ref 27.2–33.4)
MCHC: 33.8 g/dL (ref 32.0–36.0)
MCV: 93.3 fL (ref 79.3–98.0)
MONO#: 0.2 10*3/uL (ref 0.1–0.9)
MONO%: 6.9 % (ref 0.0–14.0)
NEUT%: 66.8 % (ref 39.0–75.0)
NEUTROS ABS: 1.9 10*3/uL (ref 1.5–6.5)
PLATELETS: 84 10*3/uL — AB (ref 140–400)
RBC: 2.85 10*6/uL — ABNORMAL LOW (ref 4.20–5.82)
RDW: 16.7 % — ABNORMAL HIGH (ref 11.0–14.6)
WBC: 2.9 10*3/uL — AB (ref 4.0–10.3)

## 2015-03-17 LAB — MAGNESIUM (CC13): MAGNESIUM: 1.4 mg/dL — AB (ref 1.5–2.5)

## 2015-03-17 NOTE — Progress Notes (Signed)
[name] [phone]   [phone]  OFFICE PROGRESS NOTE  [name], MD [place] [address]  DIAGNOSIS: Extensive stage, Stage IV (T2a, N1, M1b) small cell lung cancer diagnosed in July of 2015.  PRIOR THERAPY:  1) Systemic chemotherapy with carboplatin for AUC of 5 on day 1 and etoposide at 120 mg/M2 on days 1, 2 and 3 with Neulasta support on day 4. Status post 6 cycles.  2) Prophylactic cranial irradiation under the care of Dr. Sondra Come. 3) Palliative radiotherapy to the T7-spine completed on 09/06/2014.  CURRENT THERAPY: Second line systemic chemotherapy with cisplatin 30 MG/M2 and irinotecan 65 MG/M2 on days 1 and 8 every 3 weeks, status post 4 cycles.  INTERVAL HISTORY: Kerry Peck 54 y.o. male returns to the clinic today for follow up visit. The patient is currently undergoing systemic chemotherapy with cisplatin and irinotecan is status post 4 cycles and tolerating his treatment fairly well. The patient denied having any significant chest pain, shortness of breath, cough or hemoptysis. He has no significant nausea or vomiting, no fever or chills. He does report some mild episodes of nausea and vomiting but these episodes are well controlled with his current antiemetics. For pain management he is currently on OxyContin 60 mg by mouth twice a day in addition to oxycodone 10 mg by mouth Q4-6 hours as needed for pain. He reports noting a small amount of dark red blood on tissue when he wiped after bowel movement. He has a history of hemorrhoids. He's had no further episodes. He presents for symptom management visit prior to the start of cycle #5 and that is scheduled for 03/24/2015.  MEDICAL HISTORY: Past Medical History  Diagnosis Date  . Hypertension   . Smoking addiction   . Radiation 08/16/14-09/06/14    prophylatic cranial radiation 25 gray, chest, t-spine  35 gray  . Radiation 09/29/14-10/12/14    left hilar  area 30 gray, right pelvis 30 gray, right lower rib cage area 30 gray  . Cancer     lung and liver  . 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 Lung cancer   . Liver cancer   . Diabetes mellitus without complication     ALLERGIES:  has No Known Allergies.  MEDICATIONS:  Current Outpatient Prescriptions  Medication Sig Dispense Refill  . albuterol (PROVENTIL HFA;VENTOLIN HFA) 108 (90 BASE) MCG/ACT inhaler Inhale 2 puffs into the lungs every 6 (six) hours as needed for wheezing. 1 Inhaler 0  . alum & mag hydroxide-simeth (MAALOX/MYLANTA) 200-200-20 MG/5ML suspension Take 30 mLs by mouth every 6 (six) hours as needed for indigestion or heartburn (indigestion).    Marland Kitchen atorvastatin (LIPITOR) 40 MG tablet Take 40 mg by mouth daily.    . benzocaine (ORAJEL) 10 % mucosal gel Use as directed 1 application in the mouth or throat 3 (three) times daily as needed for mouth pain (mouth pain).    . bismuth subsalicylate (PEPTO BISMOL) 262 MG/15ML suspension Take 30 mLs by mouth every 6 (six) hours as needed for indigestion (indigestion).    . calcium-vitamin D (OSCAL WITH D) 500-200 MG-UNIT per tablet Take 1 tablet by mouth daily with breakfast.     . cephALEXin (KEFLEX) 500 MG capsule TK ONE C PO TID  0  . emollient (BIAFINE) cream Apply topically as needed. (Patient taking differently: Apply 1 application topically as needed. For radiation itch.) 454 g 0  . Fluticasone-Salmeterol (ADVAIR) 250-50 MCG/DOSE AEPB  Inhale 2 puffs into the lungs 2 (two) times daily as needed (wheezing).     . Insulin Glargine (LANTUS) 100 UNIT/ML Solostar Pen Inject 15 Units into the skin daily. 15 mL 0  . Insulin Pen Needle (PEN NEEDLES 3/16") 31G X 5 MM MISC Use daily with your Lantus injection 90 each 0  . levofloxacin (LEVAQUIN) 500 MG tablet Take 1 tablet (500 mg total) by mouth daily. 7 tablet 0  . magnesium oxide (MAGNESIUM-OXIDE) 400 (241.3 MG) MG tablet Take 3 tablets (1,200 mg total) by mouth 3 (three) times daily. 270  tablet 0  . metFORMIN (GLUCOPHAGE) 1000 MG tablet Take 1,000 mg by mouth 2 (two) times daily with a meal.    . Multiple Vitamins-Minerals (CENTRUM SILVER ADULT 50+ PO) Take 1 tablet by mouth daily.     . ondansetron (ZOFRAN) 8 MG tablet Take 8 mg by mouth every 8 (eight) hours as needed for nausea or vomiting (nause and vomiting). Disp. 20 tablets. 1 refill.  Called in to [facility] per Dr. Sondra Come on 11/24/14.    Marland Kitchen ONE TOUCH ULTRA TEST test strip 1 each by Other route 3 (three) times daily.     . Oxycodone HCl 10 MG TABS Take 1-2 tablets (10-20 mg total) by mouth every 4 (four) hours as needed. 60 tablet 0  . OxyCODONE HCl ER 60 MG T12A Take one tablet (60 mg) by mouth every 8 hours 90 each 0  . pantoprazole (PROTONIX) 40 MG tablet Take 40 mg by mouth daily.     . polyethylene glycol (MIRALAX / GLYCOLAX) packet Take 17 g by mouth daily. 14 each 0  . potassium chloride SA (K-DUR,KLOR-CON) 20 MEQ tablet Take 1 tablet (20 mEq total) by mouth daily. 30 tablet 0  . pyridOXINE (VITAMIN B-6) 100 MG tablet Take 100 mg by mouth daily.    Marland Kitchen senna (SENOKOT) 8.6 MG TABS tablet Take 1 tablet (8.6 mg total) by mouth 2 (two) times daily. 120 each 0  . sucralfate (CARAFATE) 1 GM/10ML suspension Take 10 mLs (1 g total) by mouth 4 (four) times daily -  with meals and at bedtime. (Patient taking differently: Take 1 g by mouth 3 (three) times daily as needed (throat pain). ) 420 mL 1  . vitamin C (ASCORBIC ACID) 500 MG tablet Take 500 mg by mouth daily.    . furosemide (LASIX) 20 MG tablet Take 2 tablets by mouth daily or as instructed (Patient not taking: Reported on 03/17/2015) 30 tablet 0   No current facility-administered medications for this visit.   Facility-Administered Medications Ordered in Other Visits  Medication Dose Route Frequency Provider Last Rate Last Dose  . morphine 4 MG/ML injection 2 mg  2 mg Intravenous Q30 min PRN Susanne Borders, NP   2 mg at 11/23/14 1345    REVIEW OF SYSTEMS:   Constitutional: positive for fatigue Eyes: negative Ears, nose, mouth, throat, and face: negative Respiratory: negative Cardiovascular: negative Gastrointestinal: negative Genitourinary:negative Integument/breast: negative Hematologic/lymphatic: negative Musculoskeletal:positive for back pain and bone pain Neurological: negative Behavioral/Psych: negative Endocrine: negative Allergic/Immunologic: negative   PHYSICAL EXAMINATION: General appearance: alert, cooperative and no distress Head: Normocephalic, without obvious abnormality, atraumatic Neck: no adenopathy, no JVD, supple, symmetrical, trachea midline and thyroid not enlarged, symmetric, no tenderness/mass/nodules Lymph nodes: Cervical, supraclavicular, and axillary nodes normal. Resp: clear to auscultation bilaterally Back: symmetric, no curvature. ROM normal. No CVA tenderness. Cardio: regular rate and rhythm, S1, S2 normal, no murmur, click, rub or gallop GI: soft,  non-tender; bowel sounds normal; no masses,  no organomegaly Extremities: extremities normal, atraumatic, no cyanosis or edema Neurologic: Alert and oriented X 3, normal strength and tone. Normal symmetric reflexes. Normal coordination and gait  ECOG PERFORMANCE STATUS: 2 - Symptomatic, <50% confined to bed  Blood pressure 120/73, pulse 94, temperature 98.5 F (36.9 C), temperature source Oral, resp. rate 18, height '6\' 2"'$  (1.88 m), weight 170 lb 4.8 oz (77.248 kg), SpO2 97 %.  LABORATORY DATA: Lab Results  Component Value Date   WBC 2.9* 03/17/2015   HGB 9.0* 03/17/2015   HCT 26.6* 03/17/2015   MCV 93.3 03/17/2015   PLT 84* 03/17/2015      Chemistry      Component Value Date/Time   NA 140 03/17/2015 0851   NA 138 02/15/2015 2148   K 4.0 03/17/2015 0851   K 3.7 02/15/2015 2148   CL 103 02/15/2015 2148   CO2 25 03/17/2015 0851   CO2 26 02/15/2015 2148   BUN 9.6 03/17/2015 0851   BUN 6 02/15/2015 2148   CREATININE 0.6* 03/17/2015 0851    CREATININE 0.53* 02/15/2015 2148      Component Value Date/Time   CALCIUM 9.1 03/17/2015 0851   CALCIUM 7.8* 02/15/2015 2148   ALKPHOS 211* 03/17/2015 0851   ALKPHOS 201* 02/13/2015 2203   AST 18 03/17/2015 0851   AST 56* 02/13/2015 2203   ALT 10 03/17/2015 0851   ALT 24 02/13/2015 2203   BILITOT 0.48 03/17/2015 0851   BILITOT 0.7 02/13/2015 2203       RADIOGRAPHIC STUDIES: Ct Chest W Contrast  03/01/2015   CLINICAL DATA:  Small cell lung cancer followup.  EXAM: CT CHEST, ABDOMEN, AND PELVIS WITH CONTRAST  TECHNIQUE: Multidetector CT imaging of the chest, abdomen and pelvis was performed following the standard protocol during bolus administration of intravenous contrast.  CONTRAST:  131m OMNIPAQUE IOHEXOL 300 MG/ML  SOLN  COMPARISON:  01/17/2015  FINDINGS: CT CHEST FINDINGS  Mediastinum: Normal heart size. There is no pericardial effusion. The trachea appears patent and is midline. Normal appearance of the esophagus. No enlarged mediastinal or hilar lymph nodes. No axillary or supraclavicular adenopathy.  Lungs/Pleura: Tiny left pleural effusion versus pleural thickening noted. Pulmonary nodule in the left lower lobe measures 1 x 0.8 cm, image 32/series 4. Previously 1.8 x 1.2 cm. The pulmonary nodule within the left lower lobe measures 1.8 by 1.3 cm, image number 39/ series 4. This is unchanged from previous exam. The index pulmonary nodule within the central right lower lobe measures 0.6 cm, image 37/series 4. Previously 0.9 cm. No new or progressive disease identified.  Musculoskeletal: Extensive bone metastases are again noted. Numerous pathologic fractures are identified within the thoracic and lumbar spine index lesion involving the T7 vertebra appears lytic measuring 2.2 cm, image 68 of series 603. Unchanged from previous exam. T4 lesion appears similar to previous study, image 69 of series 603.  CT ABDOMEN AND PELVIS FINDINGS  Hepatobiliary: Multi focal low-attenuation lesions are noted  within the liver compatible with metastatic disease. Index lesion within the right hepatic lobe measures 1.8 cm, image 64/series 2. Previously 1 cm. Index lesion within the dome of liver measures 8 mm, image 53/series 2. Previously this measured the same. 8 mm lesion along the dome of liver is unchanged, image 54/series 2. Inferior right hepatic lobe lesion measures 1.4 cm, image 71/series 2. Previously 1.3 cm. At a slightly lower level there is an enlarging lesion measuring 2.2 cm, image 75/series 2. Previously 7  mm. Multiple stones and sludge noted within the neck of gallbladder. No biliary dilatation.  Pancreas: Normal appearance of the pancreas.  Spleen: The spleen is unremarkable.  Adrenals/Urinary Tract: Normal appearance of the adrenal glands. Normal appearance of both kidneys. The urinary bladder is unremarkable.  Stomach/Bowel: The stomach and the small bowel loops are within normal limits in caliber. Normal appearance of the colon.  Vascular/Lymphatic: Calcified atherosclerotic disease involves the abdominal aorta. No aneurysm. Periaortic adenopathy is noted. Index lymph node measures 1.9 cm, image 71 of series 2. Previously 0.9 cm. Adjacent lymph node measures 1.4 cm, image 77/series 2. New from previous exam. No pelvic or inguinal adenopathy.  Reproductive: Prostate gland and seminal vesicles are unremarkable.  Other: There is no ascites or focal fluid collections within the abdomen or pelvis.  Musculoskeletal: Extensive lytic bone metastases are again identified. Index lesion at the L1 level measures 1.7 cm, image 58/series 2. Previously 1.6 cm. There is extensive tumor metastasis involving both sides of sacrum and both iliac bones. At this point and these may be of orthopedic significance. Index lesion within the right iliac bone measures 2.8 cm, image 103/series 2. Previously 2.5 cm. Left posterior iliac bone lesion measures 1.8 cm, image 98/ series 2. Previously 1.6 cm.  IMPRESSION: 1. Mixed response  to therapy. 2. No significant change in pulmonary nodules. 3. Most of the liver metastasis are stable. There is at least 1 enlarging lesion within the inferior right lobe of liver. 4. Interval development of periaortic metastatic adenopathy. 5. Extensive lytic bone metastasis. Lytic lesions within the pelvis are mildly increased in size from previous exam. Lesions involving both sides of the sacrum are likely of orthopedic significance. 6. Aortic atherosclerosis. 7. Gallstones.   Electronically Signed   By: Kerby Moors M.D.   On: 03/01/2015 11:40   Ct Abdomen Pelvis W Contrast  03/01/2015   CLINICAL DATA:  Small cell lung cancer followup.  EXAM: CT CHEST, ABDOMEN, AND PELVIS WITH CONTRAST  TECHNIQUE: Multidetector CT imaging of the chest, abdomen and pelvis was performed following the standard protocol during bolus administration of intravenous contrast.  CONTRAST:  19m OMNIPAQUE IOHEXOL 300 MG/ML  SOLN  COMPARISON:  01/17/2015  FINDINGS: CT CHEST FINDINGS  Mediastinum: Normal heart size. There is no pericardial effusion. The trachea appears patent and is midline. Normal appearance of the esophagus. No enlarged mediastinal or hilar lymph nodes. No axillary or supraclavicular adenopathy.  Lungs/Pleura: Tiny left pleural effusion versus pleural thickening noted. Pulmonary nodule in the left lower lobe measures 1 x 0.8 cm, image 32/series 4. Previously 1.8 x 1.2 cm. The pulmonary nodule within the left lower lobe measures 1.8 by 1.3 cm, image number 39/ series 4. This is unchanged from previous exam. The index pulmonary nodule within the central right lower lobe measures 0.6 cm, image 37/series 4. Previously 0.9 cm. No new or progressive disease identified.  Musculoskeletal: Extensive bone metastases are again noted. Numerous pathologic fractures are identified within the thoracic and lumbar spine index lesion involving the T7 vertebra appears lytic measuring 2.2 cm, image 68 of series 603. Unchanged from  previous exam. T4 lesion appears similar to previous study, image 69 of series 603.  CT ABDOMEN AND PELVIS FINDINGS  Hepatobiliary: Multi focal low-attenuation lesions are noted within the liver compatible with metastatic disease. Index lesion within the right hepatic lobe measures 1.8 cm, image 64/series 2. Previously 1 cm. Index lesion within the dome of liver measures 8 mm, image 53/series 2. Previously this measured the  same. 8 mm lesion along the dome of liver is unchanged, image 54/series 2. Inferior right hepatic lobe lesion measures 1.4 cm, image 71/series 2. Previously 1.3 cm. At a slightly lower level there is an enlarging lesion measuring 2.2 cm, image 75/series 2. Previously 7 mm. Multiple stones and sludge noted within the neck of gallbladder. No biliary dilatation.  Pancreas: Normal appearance of the pancreas.  Spleen: The spleen is unremarkable.  Adrenals/Urinary Tract: Normal appearance of the adrenal glands. Normal appearance of both kidneys. The urinary bladder is unremarkable.  Stomach/Bowel: The stomach and the small bowel loops are within normal limits in caliber. Normal appearance of the colon.  Vascular/Lymphatic: Calcified atherosclerotic disease involves the abdominal aorta. No aneurysm. Periaortic adenopathy is noted. Index lymph node measures 1.9 cm, image 71 of series 2. Previously 0.9 cm. Adjacent lymph node measures 1.4 cm, image 77/series 2. New from previous exam. No pelvic or inguinal adenopathy.  Reproductive: Prostate gland and seminal vesicles are unremarkable.  Other: There is no ascites or focal fluid collections within the abdomen or pelvis.  Musculoskeletal: Extensive lytic bone metastases are again identified. Index lesion at the L1 level measures 1.7 cm, image 58/series 2. Previously 1.6 cm. There is extensive tumor metastasis involving both sides of sacrum and both iliac bones. At this point and these may be of orthopedic significance. Index lesion within the right iliac  bone measures 2.8 cm, image 103/series 2. Previously 2.5 cm. Left posterior iliac bone lesion measures 1.8 cm, image 98/ series 2. Previously 1.6 cm.  IMPRESSION: 1. Mixed response to therapy. 2. No significant change in pulmonary nodules. 3. Most of the liver metastasis are stable. There is at least 1 enlarging lesion within the inferior right lobe of liver. 4. Interval development of periaortic metastatic adenopathy. 5. Extensive lytic bone metastasis. Lytic lesions within the pelvis are mildly increased in size from previous exam. Lesions involving both sides of the sacrum are likely of orthopedic significance. 6. Aortic atherosclerosis. 7. Gallstones.   Electronically Signed   By: Kerby Moors M.D.   On: 03/01/2015 11:40   Dg Chest Port 1 View  02/15/2015   CLINICAL DATA:  Chest pain, left-sided.  History of lung carcinoma  EXAM: PORTABLE CHEST - 1 VIEW  COMPARISON:  Chest radiograph and chest CT February 13, 2015  FINDINGS: There is no edema or consolidation. There is a nodular opacity in the left lower lobe which corresponds to a nodular lesions seen on recent chest CT. This nodular lesion measures 1.4 x 1.0 cm. Heart size and pulmonary vascularity are normal. No adenopathy. There are areas of bony metastatic disease, better seen on CT. There is fragmentation along the superior, lateral left clavicle, also seen on recent CT.  IMPRESSION: Pulmonary nodular lesion left lower lobe. Areas of bony metastatic disease, better appreciable on recent chest CT. No edema or consolidation.   Electronically Signed   By: Lowella Grip III M.D.   On: 02/15/2015 21:45    ASSESSMENT AND PLAN: this is a very pleasant 54 years old white male recently diagnosed with extensive stage small cell lung cancer status post 6 cycles of systemic chemotherapy with carboplatin and etoposide with partial response. This was followed by prophylactic cranial irradiation in addition to palliative radiotherapy to the T7 thoracic spine and  the chest. Restaging CT scan of the Chest, Abdomen and pelvis showed evidence for further disease progression with progressive osseous metastasis and tumor canal encroachment involving the thoracic spine as well as development of  hepatic metastasis and nodal metastasis within the upper abdomen. He is currently undergoing systemic chemotherapy with cisplatin and irinotecan is status post 3 cycles. He is tolerating the treatment well. Repeat CT scan of the chest, abdomen and pelvis showed mixed response with a stable pulmonary nodules as well as liver lesion except for enlarging right lobe of the liver lesion as well as development of periaortic metastatic adenopathy. His platelet count is slightly low at 84,000. As he has noticed a small amount of blood when wiping after bowel movement have given him a set of stool cards to complete. He'll return in one week for reevaluation and potentially to start cycle #5 as long as his laboratory numbers are within treatable range. He should return to stool cards at that time, we will check for occult blood.For pain management, he was advised to take OxyContin as scheduled and oxycodone only for breakthrough pain.  For the chemotherapy-induced anemia, we will continue to monitor his hemoglobin and hematocrit closely and consider the patient for PRBCs transfusion if his hemoglobin is less than 8 g.0/dL. The patient was advised to call immediately if he has any concerning symptoms in the interval. The patient voices understanding of current disease status and treatment options and is in agreement with the current care plan.  All questions were answered. The patient knows to call the clinic with any problems, questions or concerns. We can certainly see the patient much sooner if necessary.  Carlton Adam, PA-C 03/17/2015   Disclaimer: This note was dictated with voice recognition software. Similar sounding words can inadvertently be transcribed and may not be  corrected upon review.

## 2015-03-17 NOTE — Telephone Encounter (Signed)
per pof to sch pt appt-gave pt copy pf avs

## 2015-03-17 NOTE — Patient Instructions (Addendum)
Follow up in one week Complete the stool cards per intructions

## 2015-03-17 NOTE — Progress Notes (Signed)
Informed patient to take magnesium 4 times daily.  Per Awilda Metro, PA.  Patient verbalized understanding.

## 2015-03-20 ENCOUNTER — Ambulatory Visit (HOSPITAL_BASED_OUTPATIENT_CLINIC_OR_DEPARTMENT_OTHER): Payer: Federal, State, Local not specified - PPO

## 2015-03-20 ENCOUNTER — Other Ambulatory Visit: Payer: Self-pay | Admitting: Medical Oncology

## 2015-03-20 ENCOUNTER — Other Ambulatory Visit: Payer: Self-pay | Admitting: *Deleted

## 2015-03-20 DIAGNOSIS — K625 Hemorrhage of anus and rectum: Secondary | ICD-10-CM | POA: Diagnosis not present

## 2015-03-20 DIAGNOSIS — T451X5A Adverse effect of antineoplastic and immunosuppressive drugs, initial encounter: Secondary | ICD-10-CM

## 2015-03-20 DIAGNOSIS — C3490 Malignant neoplasm of unspecified part of unspecified bronchus or lung: Secondary | ICD-10-CM

## 2015-03-20 DIAGNOSIS — D6481 Anemia due to antineoplastic chemotherapy: Secondary | ICD-10-CM

## 2015-03-20 LAB — FECAL OCCULT BLOOD, GUAIAC: OCCULT BLOOD: NEGATIVE

## 2015-03-22 ENCOUNTER — Other Ambulatory Visit: Payer: Self-pay | Admitting: Physician Assistant

## 2015-03-24 ENCOUNTER — Ambulatory Visit: Payer: Federal, State, Local not specified - PPO

## 2015-03-24 ENCOUNTER — Ambulatory Visit (HOSPITAL_BASED_OUTPATIENT_CLINIC_OR_DEPARTMENT_OTHER): Payer: Federal, State, Local not specified - PPO | Admitting: Physician Assistant

## 2015-03-24 ENCOUNTER — Telehealth: Payer: Self-pay | Admitting: *Deleted

## 2015-03-24 ENCOUNTER — Other Ambulatory Visit (HOSPITAL_BASED_OUTPATIENT_CLINIC_OR_DEPARTMENT_OTHER): Payer: Federal, State, Local not specified - PPO

## 2015-03-24 ENCOUNTER — Telehealth: Payer: Self-pay | Admitting: Physician Assistant

## 2015-03-24 ENCOUNTER — Other Ambulatory Visit: Payer: Federal, State, Local not specified - PPO

## 2015-03-24 ENCOUNTER — Encounter: Payer: Self-pay | Admitting: Physician Assistant

## 2015-03-24 VITALS — BP 107/69 | HR 99 | Temp 99.3°F | Wt 172.0 lb

## 2015-03-24 DIAGNOSIS — T451X5A Adverse effect of antineoplastic and immunosuppressive drugs, initial encounter: Secondary | ICD-10-CM

## 2015-03-24 DIAGNOSIS — R1310 Dysphagia, unspecified: Secondary | ICD-10-CM

## 2015-03-24 DIAGNOSIS — C3490 Malignant neoplasm of unspecified part of unspecified bronchus or lung: Secondary | ICD-10-CM

## 2015-03-24 DIAGNOSIS — D6481 Anemia due to antineoplastic chemotherapy: Secondary | ICD-10-CM | POA: Diagnosis not present

## 2015-03-24 DIAGNOSIS — C7951 Secondary malignant neoplasm of bone: Secondary | ICD-10-CM | POA: Diagnosis not present

## 2015-03-24 DIAGNOSIS — C7A1 Malignant poorly differentiated neuroendocrine tumors: Secondary | ICD-10-CM | POA: Diagnosis not present

## 2015-03-24 DIAGNOSIS — C7B8 Other secondary neuroendocrine tumors: Secondary | ICD-10-CM

## 2015-03-24 LAB — CBC WITH DIFFERENTIAL/PLATELET
BASO%: 0.3 % (ref 0.0–2.0)
BASOS ABS: 0 10*3/uL (ref 0.0–0.1)
EOS%: 2.9 % (ref 0.0–7.0)
Eosinophils Absolute: 0.1 10*3/uL (ref 0.0–0.5)
HEMATOCRIT: 23.3 % — AB (ref 38.4–49.9)
HEMOGLOBIN: 7.9 g/dL — AB (ref 13.0–17.1)
LYMPH#: 0.5 10*3/uL — AB (ref 0.9–3.3)
LYMPH%: 23.3 % (ref 14.0–49.0)
MCH: 32.5 pg (ref 27.2–33.4)
MCHC: 33.9 g/dL (ref 32.0–36.0)
MCV: 95.7 fL (ref 79.3–98.0)
MONO#: 0.4 10*3/uL (ref 0.1–0.9)
MONO%: 17.7 % — ABNORMAL HIGH (ref 0.0–14.0)
NEUT#: 1.2 10*3/uL — ABNORMAL LOW (ref 1.5–6.5)
NEUT%: 55.8 % (ref 39.0–75.0)
PLATELETS: 90 10*3/uL — AB (ref 140–400)
RBC: 2.43 10*6/uL — ABNORMAL LOW (ref 4.20–5.82)
RDW: 19.5 % — AB (ref 11.0–14.6)
WBC: 2.1 10*3/uL — ABNORMAL LOW (ref 4.0–10.3)

## 2015-03-24 LAB — COMPREHENSIVE METABOLIC PANEL (CC13)
ALK PHOS: 222 U/L — AB (ref 40–150)
ALT: 10 U/L (ref 0–55)
ANION GAP: 8 meq/L (ref 3–11)
AST: 21 U/L (ref 5–34)
Albumin: 2.9 g/dL — ABNORMAL LOW (ref 3.5–5.0)
BILIRUBIN TOTAL: 0.58 mg/dL (ref 0.20–1.20)
BUN: 10.4 mg/dL (ref 7.0–26.0)
CALCIUM: 9.1 mg/dL (ref 8.4–10.4)
CO2: 27 meq/L (ref 22–29)
CREATININE: 0.7 mg/dL (ref 0.7–1.3)
Chloride: 104 mEq/L (ref 98–109)
EGFR: >90 mL/min/{1.73_m2} (ref >90)
GLUCOSE: 127 mg/dL (ref 70–140)
Potassium: 4 mEq/L (ref 3.5–5.1)
Sodium: 139 mEq/L (ref 136–145)
Total Protein: 6 g/dL — ABNORMAL LOW (ref 6.4–8.3)

## 2015-03-24 LAB — MAGNESIUM (CC13): Magnesium: 1.6 mg/dl (ref 1.5–2.5)

## 2015-03-24 NOTE — Telephone Encounter (Signed)
per pof to sch pt appt-sent MW email to sch trmt-adv pt will call once reply

## 2015-03-24 NOTE — Telephone Encounter (Signed)
cld pt per reply from [place] and gave pt appt time for lab/infusion time for 8/5

## 2015-03-24 NOTE — Telephone Encounter (Signed)
Per staff message and POF I have scheduled appts. Advised scheduler of appts and that the MD on 8/12 to late for treatment. Patient needs to be in chemo by 9am  JMW

## 2015-03-24 NOTE — Progress Notes (Signed)
[place] [phone]   [phone]  OFFICE PROGRESS NOTE  [name], MD [place] [address]  DIAGNOSIS: Extensive stage, Stage IV (T2a, N1, M1b) small cell lung cancer diagnosed in July of 2015.  PRIOR THERAPY:  1) Systemic chemotherapy with carboplatin for AUC of 5 on day 1 and etoposide at 120 mg/M2 on days 1, 2 and 3 with Neulasta support on day 4. Status post 6 cycles.  2) Prophylactic cranial irradiation under the care of Dr. Sondra Come. 3) Palliative radiotherapy to the T7-spine completed on 09/06/2014.  CURRENT THERAPY: Second line systemic chemotherapy with cisplatin 30 MG/M2 and irinotecan 65 MG/M2 on days 1 and 8 every 3 weeks, status post 4 cycles.  INTERVAL HISTORY: Kerry Peck 54 y.o. male returns to the clinic today for follow up visit. The patient is currently undergoing systemic chemotherapy with cisplatin and irinotecan is status post 4 cycles and tolerating his treatment fairly well. The patient denied having any significant chest pain, shortness of breath, cough or hemoptysis. He has no significant nausea or vomiting, no fever or chills. He does report some episodes of diarrhea after his increased dose of oral magnesium.  For pain management he is currently on OxyContin 60 mg by mouth twice a day in addition to oxycodone 10 mg by mouth Q4-6 hours as needed for pain. He presents for symptom management visit prior to the start of cycle #5.   MEDICAL HISTORY: Past Medical History  Diagnosis Date  . Hypertension   . Smoking addiction   . Radiation 08/16/14-09/06/14    prophylatic cranial radiation 25 gray, chest, t-spine  35 gray  . Radiation 09/29/14-10/12/14    left hilar area 30 gray, right pelvis 30 gray, right lower rib cage area 30 gray  . Cancer     lung and liver  . 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 Lung cancer   . Liver cancer   . Diabetes mellitus without complication      ALLERGIES:  has No Known Allergies.  MEDICATIONS:  Current Outpatient Prescriptions  Medication Sig Dispense Refill  . albuterol (PROVENTIL HFA;VENTOLIN HFA) 108 (90 BASE) MCG/ACT inhaler Inhale 2 puffs into the lungs every 6 (six) hours as needed for wheezing. 1 Inhaler 0  . alum & mag hydroxide-simeth (MAALOX/MYLANTA) 200-200-20 MG/5ML suspension Take 30 mLs by mouth every 6 (six) hours as needed for indigestion or heartburn (indigestion).    Marland Kitchen atorvastatin (LIPITOR) 40 MG tablet Take 40 mg by mouth daily.    . benzocaine (ORAJEL) 10 % mucosal gel Use as directed 1 application in the mouth or throat 3 (three) times daily as needed for mouth pain (mouth pain).    . bismuth subsalicylate (PEPTO BISMOL) 262 MG/15ML suspension Take 30 mLs by mouth every 6 (six) hours as needed for indigestion (indigestion).    . calcium-vitamin D (OSCAL WITH D) 500-200 MG-UNIT per tablet Take 1 tablet by mouth daily with breakfast.     . cephALEXin (KEFLEX) 500 MG capsule TK ONE C PO TID  0  . emollient (BIAFINE) cream Apply topically as needed. (Patient taking differently: Apply 1 application topically as needed. For radiation itch.) 454 g 0  . Fluticasone-Salmeterol (ADVAIR) 250-50 MCG/DOSE AEPB Inhale 2 puffs into the lungs 2 (two) times daily as needed (wheezing).     . furosemide (LASIX) 20 MG tablet Take 2 tablets by mouth daily or as instructed 30 tablet 0  . Insulin Glargine (  LANTUS) 100 UNIT/ML Solostar Pen Inject 15 Units into the skin daily. 15 mL 0  . Insulin Pen Needle (PEN NEEDLES 3/16") 31G X 5 MM MISC Use daily with your Lantus injection 90 each 0  . levofloxacin (LEVAQUIN) 500 MG tablet Take 1 tablet (500 mg total) by mouth daily. 7 tablet 0  . magnesium oxide (MAGNESIUM-OXIDE) 400 (241.3 MG) MG tablet Take 3 tablets (1,200 mg total) by mouth 3 (three) times daily. 270 tablet 0  . metFORMIN (GLUCOPHAGE) 1000 MG tablet Take 1,000 mg by mouth 2 (two) times daily with a meal.    . Multiple  Vitamins-Minerals (CENTRUM SILVER ADULT 50+ PO) Take 1 tablet by mouth daily.     . ondansetron (ZOFRAN) 8 MG tablet Take 8 mg by mouth every 8 (eight) hours as needed for nausea or vomiting (nause and vomiting). Disp. 20 tablets. 1 refill.  Called in to [facility] per Dr. Sondra Come on 11/24/14.    Marland Kitchen ONE TOUCH ULTRA TEST test strip 1 each by Other route 3 (three) times daily.     . Oxycodone HCl 10 MG TABS Take 1-2 tablets (10-20 mg total) by mouth every 4 (four) hours as needed. 60 tablet 0  . OxyCODONE HCl ER 60 MG T12A Take one tablet (60 mg) by mouth every 8 hours 90 each 0  . pantoprazole (PROTONIX) 40 MG tablet Take 40 mg by mouth daily.     . polyethylene glycol (MIRALAX / GLYCOLAX) packet Take 17 g by mouth daily. 14 each 0  . potassium chloride SA (K-DUR,KLOR-CON) 20 MEQ tablet Take 1 tablet (20 mEq total) by mouth daily. 30 tablet 0  . pyridOXINE (VITAMIN B-6) 100 MG tablet Take 100 mg by mouth daily.    . sucralfate (CARAFATE) 1 GM/10ML suspension Take 10 mLs (1 g total) by mouth 4 (four) times daily -  with meals and at bedtime. (Patient taking differently: Take 1 g by mouth 3 (three) times daily as needed (throat pain). ) 420 mL 1  . vitamin C (ASCORBIC ACID) 500 MG tablet Take 500 mg by mouth daily.    Marland Kitchen senna (SENOKOT) 8.6 MG TABS tablet TAKE ONE TABLET BY MOUTH TWICE DAILY 120 tablet 0   No current facility-administered medications for this visit.   Facility-Administered Medications Ordered in Other Visits  Medication Dose Route Frequency Provider Last Rate Last Dose  . morphine 4 MG/ML injection 2 mg  2 mg Intravenous Q30 min PRN Susanne Borders, NP   2 mg at 11/23/14 1345    REVIEW OF SYSTEMS:  Constitutional: positive for fatigue Eyes: negative Ears, nose, mouth, throat, and face: negative Respiratory: negative Cardiovascular: negative Gastrointestinal: positive for diarrhea Genitourinary:negative Integument/breast: negative Hematologic/lymphatic:  negative Musculoskeletal:positive for back pain and bone pain Neurological: negative Behavioral/Psych: negative Endocrine: negative Allergic/Immunologic: negative   PHYSICAL EXAMINATION: General appearance: alert, cooperative and no distress Head: Normocephalic, without obvious abnormality, atraumatic Neck: no adenopathy, no JVD, supple, symmetrical, trachea midline and thyroid not enlarged, symmetric, no tenderness/mass/nodules Lymph nodes: Cervical, supraclavicular, and axillary nodes normal. Resp: clear to auscultation bilaterally Back: symmetric, no curvature. ROM normal. No CVA tenderness. Cardio: regular rate and rhythm, S1, S2 normal, no murmur, click, rub or gallop GI: soft, non-tender; bowel sounds normal; no masses,  no organomegaly Extremities: extremities normal, atraumatic, no cyanosis or edema Neurologic: Alert and oriented X 3, normal strength and tone. Normal symmetric reflexes. Normal coordination and gait  ECOG PERFORMANCE STATUS: 2 - Symptomatic, <50% confined to bed  Blood pressure  107/69, pulse 99, temperature 99.3 F (37.4 C), temperature source Oral, weight 172 lb (78.019 kg), SpO2 94 %.  LABORATORY DATA: Lab Results  Component Value Date   WBC 2.1* 03/24/2015   HGB 7.9* 03/24/2015   HCT 23.3* 03/24/2015   MCV 95.7 03/24/2015   PLT 90* 03/24/2015      Chemistry      Component Value Date/Time   NA 139 03/24/2015 0847   NA 138 02/15/2015 2148   K 4.0 03/24/2015 0847   K 3.7 02/15/2015 2148   CL 103 02/15/2015 2148   CO2 27 03/24/2015 0847   CO2 26 02/15/2015 2148   BUN 10.4 03/24/2015 0847   BUN 6 02/15/2015 2148   CREATININE 0.7 03/24/2015 0847   CREATININE 0.53* 02/15/2015 2148      Component Value Date/Time   CALCIUM 9.1 03/24/2015 0847   CALCIUM 7.8* 02/15/2015 2148   ALKPHOS 222* 03/24/2015 0847   ALKPHOS 201* 02/13/2015 2203   AST 21 03/24/2015 0847   AST 56* 02/13/2015 2203   ALT 10 03/24/2015 0847   ALT 24 02/13/2015 2203   BILITOT  0.58 03/24/2015 0847   BILITOT 0.7 02/13/2015 2203       RADIOGRAPHIC STUDIES: Ct Chest W Contrast  03/01/2015   CLINICAL DATA:  Small cell lung cancer followup.  EXAM: CT CHEST, ABDOMEN, AND PELVIS WITH CONTRAST  TECHNIQUE: Multidetector CT imaging of the chest, abdomen and pelvis was performed following the standard protocol during bolus administration of intravenous contrast.  CONTRAST:  127m OMNIPAQUE IOHEXOL 300 MG/ML  SOLN  COMPARISON:  01/17/2015  FINDINGS: CT CHEST FINDINGS  Mediastinum: Normal heart size. There is no pericardial effusion. The trachea appears patent and is midline. Normal appearance of the esophagus. No enlarged mediastinal or hilar lymph nodes. No axillary or supraclavicular adenopathy.  Lungs/Pleura: Tiny left pleural effusion versus pleural thickening noted. Pulmonary nodule in the left lower lobe measures 1 x 0.8 cm, image 32/series 4. Previously 1.8 x 1.2 cm. The pulmonary nodule within the left lower lobe measures 1.8 by 1.3 cm, image number 39/ series 4. This is unchanged from previous exam. The index pulmonary nodule within the central right lower lobe measures 0.6 cm, image 37/series 4. Previously 0.9 cm. No new or progressive disease identified.  Musculoskeletal: Extensive bone metastases are again noted. Numerous pathologic fractures are identified within the thoracic and lumbar spine index lesion involving the T7 vertebra appears lytic measuring 2.2 cm, image 68 of series 603. Unchanged from previous exam. T4 lesion appears similar to previous study, image 69 of series 603.  CT ABDOMEN AND PELVIS FINDINGS  Hepatobiliary: Multi focal low-attenuation lesions are noted within the liver compatible with metastatic disease. Index lesion within the right hepatic lobe measures 1.8 cm, image 64/series 2. Previously 1 cm. Index lesion within the dome of liver measures 8 mm, image 53/series 2. Previously this measured the same. 8 mm lesion along the dome of liver is unchanged, image  54/series 2. Inferior right hepatic lobe lesion measures 1.4 cm, image 71/series 2. Previously 1.3 cm. At a slightly lower level there is an enlarging lesion measuring 2.2 cm, image 75/series 2. Previously 7 mm. Multiple stones and sludge noted within the neck of gallbladder. No biliary dilatation.  Pancreas: Normal appearance of the pancreas.  Spleen: The spleen is unremarkable.  Adrenals/Urinary Tract: Normal appearance of the adrenal glands. Normal appearance of both kidneys. The urinary bladder is unremarkable.  Stomach/Bowel: The stomach and the small bowel loops are within normal  limits in caliber. Normal appearance of the colon.  Vascular/Lymphatic: Calcified atherosclerotic disease involves the abdominal aorta. No aneurysm. Periaortic adenopathy is noted. Index lymph node measures 1.9 cm, image 71 of series 2. Previously 0.9 cm. Adjacent lymph node measures 1.4 cm, image 77/series 2. New from previous exam. No pelvic or inguinal adenopathy.  Reproductive: Prostate gland and seminal vesicles are unremarkable.  Other: There is no ascites or focal fluid collections within the abdomen or pelvis.  Musculoskeletal: Extensive lytic bone metastases are again identified. Index lesion at the L1 level measures 1.7 cm, image 58/series 2. Previously 1.6 cm. There is extensive tumor metastasis involving both sides of sacrum and both iliac bones. At this point and these may be of orthopedic significance. Index lesion within the right iliac bone measures 2.8 cm, image 103/series 2. Previously 2.5 cm. Left posterior iliac bone lesion measures 1.8 cm, image 98/ series 2. Previously 1.6 cm.  IMPRESSION: 1. Mixed response to therapy. 2. No significant change in pulmonary nodules. 3. Most of the liver metastasis are stable. There is at least 1 enlarging lesion within the inferior right lobe of liver. 4. Interval development of periaortic metastatic adenopathy. 5. Extensive lytic bone metastasis. Lytic lesions within the pelvis  are mildly increased in size from previous exam. Lesions involving both sides of the sacrum are likely of orthopedic significance. 6. Aortic atherosclerosis. 7. Gallstones.   Electronically Signed   By: Kerby Moors M.D.   On: 03/01/2015 11:40   Ct Abdomen Pelvis W Contrast  03/01/2015   CLINICAL DATA:  Small cell lung cancer followup.  EXAM: CT CHEST, ABDOMEN, AND PELVIS WITH CONTRAST  TECHNIQUE: Multidetector CT imaging of the chest, abdomen and pelvis was performed following the standard protocol during bolus administration of intravenous contrast.  CONTRAST:  145m OMNIPAQUE IOHEXOL 300 MG/ML  SOLN  COMPARISON:  01/17/2015  FINDINGS: CT CHEST FINDINGS  Mediastinum: Normal heart size. There is no pericardial effusion. The trachea appears patent and is midline. Normal appearance of the esophagus. No enlarged mediastinal or hilar lymph nodes. No axillary or supraclavicular adenopathy.  Lungs/Pleura: Tiny left pleural effusion versus pleural thickening noted. Pulmonary nodule in the left lower lobe measures 1 x 0.8 cm, image 32/series 4. Previously 1.8 x 1.2 cm. The pulmonary nodule within the left lower lobe measures 1.8 by 1.3 cm, image number 39/ series 4. This is unchanged from previous exam. The index pulmonary nodule within the central right lower lobe measures 0.6 cm, image 37/series 4. Previously 0.9 cm. No new or progressive disease identified.  Musculoskeletal: Extensive bone metastases are again noted. Numerous pathologic fractures are identified within the thoracic and lumbar spine index lesion involving the T7 vertebra appears lytic measuring 2.2 cm, image 68 of series 603. Unchanged from previous exam. T4 lesion appears similar to previous study, image 69 of series 603.  CT ABDOMEN AND PELVIS FINDINGS  Hepatobiliary: Multi focal low-attenuation lesions are noted within the liver compatible with metastatic disease. Index lesion within the right hepatic lobe measures 1.8 cm, image 64/series 2.  Previously 1 cm. Index lesion within the dome of liver measures 8 mm, image 53/series 2. Previously this measured the same. 8 mm lesion along the dome of liver is unchanged, image 54/series 2. Inferior right hepatic lobe lesion measures 1.4 cm, image 71/series 2. Previously 1.3 cm. At a slightly lower level there is an enlarging lesion measuring 2.2 cm, image 75/series 2. Previously 7 mm. Multiple stones and sludge noted within the neck of gallbladder. No  biliary dilatation.  Pancreas: Normal appearance of the pancreas.  Spleen: The spleen is unremarkable.  Adrenals/Urinary Tract: Normal appearance of the adrenal glands. Normal appearance of both kidneys. The urinary bladder is unremarkable.  Stomach/Bowel: The stomach and the small bowel loops are within normal limits in caliber. Normal appearance of the colon.  Vascular/Lymphatic: Calcified atherosclerotic disease involves the abdominal aorta. No aneurysm. Periaortic adenopathy is noted. Index lymph node measures 1.9 cm, image 71 of series 2. Previously 0.9 cm. Adjacent lymph node measures 1.4 cm, image 77/series 2. New from previous exam. No pelvic or inguinal adenopathy.  Reproductive: Prostate gland and seminal vesicles are unremarkable.  Other: There is no ascites or focal fluid collections within the abdomen or pelvis.  Musculoskeletal: Extensive lytic bone metastases are again identified. Index lesion at the L1 level measures 1.7 cm, image 58/series 2. Previously 1.6 cm. There is extensive tumor metastasis involving both sides of sacrum and both iliac bones. At this point and these may be of orthopedic significance. Index lesion within the right iliac bone measures 2.8 cm, image 103/series 2. Previously 2.5 cm. Left posterior iliac bone lesion measures 1.8 cm, image 98/ series 2. Previously 1.6 cm.  IMPRESSION: 1. Mixed response to therapy. 2. No significant change in pulmonary nodules. 3. Most of the liver metastasis are stable. There is at least 1  enlarging lesion within the inferior right lobe of liver. 4. Interval development of periaortic metastatic adenopathy. 5. Extensive lytic bone metastasis. Lytic lesions within the pelvis are mildly increased in size from previous exam. Lesions involving both sides of the sacrum are likely of orthopedic significance. 6. Aortic atherosclerosis. 7. Gallstones.   Electronically Signed   By: Kerby Moors M.D.   On: 03/01/2015 11:40    ASSESSMENT AND PLAN: this is a very pleasant 54 years old white male recently diagnosed with extensive stage small cell lung cancer status post 6 cycles of systemic chemotherapy with carboplatin and etoposide with partial response. This was followed by prophylactic cranial irradiation in addition to palliative radiotherapy to the T7 thoracic spine and the chest. Restaging CT scan of the Chest, Abdomen and pelvis showed evidence for further disease progression with progressive osseous metastasis and tumor canal encroachment involving the thoracic spine as well as development of hepatic metastasis and nodal metastasis within the upper abdomen. He is currently undergoing systemic chemotherapy with cisplatin and irinotecan is status post 3 cycles. He is tolerating the treatment well. Repeat CT scan of the chest, abdomen and pelvis showed mixed response with a stable pulmonary nodules as well as liver lesion except for enlarging right lobe of the liver lesion as well as development of periaortic metastatic adenopathy. His platelet count is slightly low at 90,000. His ANC is 1.2 hemoglobin was 7.9. He is asymptomatic from this level of anemia we will continue to monitor this closely. As his labs are suboptimal to proceed with chemotherapy as scheduled today. I will postpone the start of cycle #5 for and additional week. His magnesium has recovered and is now 1.6. He may resume taking Mag-Ox 3 tablets 3 times a day as previously. We will continue to monitor this level closely. For pain  management, he was advised to take OxyContin as scheduled and oxycodone only for breakthrough pain.  For the chemotherapy-induced anemia, we will continue to monitor his hemoglobin and hematocrit closely and consider the patient for PRBCs transfusion if his hemoglobin is less than 8 g.0/dL. The patient was advised to call immediately if he has  any concerning symptoms in the interval. The patient voices understanding of current disease status and treatment options and is in agreement with the current care plan.  All questions were answered. The patient knows to call the clinic with any problems, questions or concerns. We can certainly see the patient much sooner if necessary.  Carlton Adam, PA-C 03/24/2015   Disclaimer: This note was dictated with voice recognition software. Similar sounding words can inadvertently be transcribed and may not be corrected upon review.

## 2015-03-25 NOTE — Patient Instructions (Signed)
Follow-up in one week with repeat labs to proceed with chemotherapy if he labs are within treatable range Follow-up in 2 weeks for reevaluation

## 2015-03-28 ENCOUNTER — Telehealth: Payer: Self-pay | Admitting: *Deleted

## 2015-03-28 ENCOUNTER — Other Ambulatory Visit: Payer: Self-pay | Admitting: Medical Oncology

## 2015-03-28 DIAGNOSIS — C7951 Secondary malignant neoplasm of bone: Secondary | ICD-10-CM

## 2015-03-28 DIAGNOSIS — D6481 Anemia due to antineoplastic chemotherapy: Secondary | ICD-10-CM

## 2015-03-28 DIAGNOSIS — R1310 Dysphagia, unspecified: Secondary | ICD-10-CM

## 2015-03-28 DIAGNOSIS — C3490 Malignant neoplasm of unspecified part of unspecified bronchus or lung: Secondary | ICD-10-CM

## 2015-03-28 DIAGNOSIS — T451X5A Adverse effect of antineoplastic and immunosuppressive drugs, initial encounter: Secondary | ICD-10-CM

## 2015-03-28 MED ORDER — OXYCODONE HCL 10 MG PO TABS: 10.0000 mg | ORAL_TABLET | ORAL | Status: DC | PRN

## 2015-03-28 NOTE — Telephone Encounter (Signed)
PT. REQUESTING OXYCODONE '10MG'$ . PLEASE CALL PT. WHEN PRESCRIPTION IS READY.

## 2015-03-28 NOTE — Progress Notes (Signed)
rx locked in injection room. 

## 2015-03-30 ENCOUNTER — Telehealth: Payer: Self-pay | Admitting: Internal Medicine

## 2015-03-30 NOTE — Telephone Encounter (Signed)
s.w. pt and confirmed appt....pt ok and aware °

## 2015-03-31 ENCOUNTER — Other Ambulatory Visit: Payer: Self-pay | Admitting: Nurse Practitioner

## 2015-03-31 ENCOUNTER — Ambulatory Visit (HOSPITAL_BASED_OUTPATIENT_CLINIC_OR_DEPARTMENT_OTHER): Payer: Federal, State, Local not specified - PPO

## 2015-03-31 ENCOUNTER — Encounter: Payer: Self-pay | Admitting: Medical Oncology

## 2015-03-31 ENCOUNTER — Telehealth: Payer: Self-pay | Admitting: *Deleted

## 2015-03-31 ENCOUNTER — Other Ambulatory Visit (HOSPITAL_BASED_OUTPATIENT_CLINIC_OR_DEPARTMENT_OTHER): Payer: Federal, State, Local not specified - PPO

## 2015-03-31 VITALS — BP 128/80 | HR 94 | Temp 98.4°F | Resp 18

## 2015-03-31 DIAGNOSIS — R0789 Other chest pain: Secondary | ICD-10-CM

## 2015-03-31 DIAGNOSIS — C3490 Malignant neoplasm of unspecified part of unspecified bronchus or lung: Secondary | ICD-10-CM

## 2015-03-31 DIAGNOSIS — C7A1 Malignant poorly differentiated neuroendocrine tumors: Secondary | ICD-10-CM | POA: Diagnosis not present

## 2015-03-31 LAB — CBC WITH DIFFERENTIAL/PLATELET
BASO%: 0.4 % (ref 0.0–2.0)
Basophils Absolute: 0 10*3/uL (ref 0.0–0.1)
EOS ABS: 0.1 10*3/uL (ref 0.0–0.5)
EOS%: 1.7 % (ref 0.0–7.0)
HEMATOCRIT: 25.2 % — AB (ref 38.4–49.9)
HGB: 8.5 g/dL — ABNORMAL LOW (ref 13.0–17.1)
LYMPH#: 0.8 10*3/uL — AB (ref 0.9–3.3)
LYMPH%: 24.1 % (ref 14.0–49.0)
MCH: 32.2 pg (ref 27.2–33.4)
MCHC: 33.5 g/dL (ref 32.0–36.0)
MCV: 96.1 fL (ref 79.3–98.0)
MONO#: 0.5 10*3/uL (ref 0.1–0.9)
MONO%: 14.8 % — ABNORMAL HIGH (ref 0.0–14.0)
NEUT%: 59 % (ref 39.0–75.0)
NEUTROS ABS: 1.9 10*3/uL (ref 1.5–6.5)
Platelets: 83 10*3/uL — ABNORMAL LOW (ref 140–400)
RBC: 2.62 10*6/uL — ABNORMAL LOW (ref 4.20–5.82)
RDW: 19.3 % — ABNORMAL HIGH (ref 11.0–14.6)
WBC: 3.3 10*3/uL — AB (ref 4.0–10.3)

## 2015-03-31 LAB — COMPREHENSIVE METABOLIC PANEL (CC13)
ALK PHOS: 252 U/L — AB (ref 40–150)
ALT: 17 U/L (ref 0–55)
AST: 38 U/L — ABNORMAL HIGH (ref 5–34)
Albumin: 3 g/dL — ABNORMAL LOW (ref 3.5–5.0)
Anion Gap: 11 mEq/L (ref 3–11)
BUN: 13.3 mg/dL (ref 7.0–26.0)
CHLORIDE: 103 meq/L (ref 98–109)
CO2: 28 meq/L (ref 22–29)
Calcium: 10.1 mg/dL (ref 8.4–10.4)
Creatinine: 0.7 mg/dL (ref 0.7–1.3)
GLUCOSE: 149 mg/dL — AB (ref 70–140)
POTASSIUM: 3.7 meq/L (ref 3.5–5.1)
SODIUM: 142 meq/L (ref 136–145)
TOTAL PROTEIN: 6.4 g/dL (ref 6.4–8.3)
Total Bilirubin: 0.71 mg/dL (ref 0.20–1.20)

## 2015-03-31 LAB — MAGNESIUM (CC13): MAGNESIUM: 1.4 mg/dL — AB (ref 1.5–2.5)

## 2015-03-31 MED ORDER — SODIUM CHLORIDE 0.9 % IV SOLN
Freq: Once | INTRAVENOUS | Status: AC
  Administered 2015-03-31: 10:00:00 via INTRAVENOUS

## 2015-03-31 MED ORDER — SODIUM CHLORIDE 0.9 % IV SOLN
2.0000 g | Freq: Once | INTRAVENOUS | Status: AC
  Administered 2015-03-31: 2 g via INTRAVENOUS
  Filled 2015-03-31: qty 4

## 2015-03-31 MED ORDER — MORPHINE SULFATE 4 MG/ML IJ SOLN
2.0000 mg | INTRAMUSCULAR | Status: AC | PRN
  Administered 2015-03-31 (×2): 2 mg via INTRAVENOUS

## 2015-03-31 MED ORDER — ONDANSETRON HCL 8 MG PO TABS: 8.0000 mg | ORAL_TABLET | Freq: Three times a day (TID) | ORAL | Status: AC | PRN

## 2015-03-31 MED ORDER — SODIUM CHLORIDE 0.9 % IV SOLN
Freq: Once | INTRAVENOUS | Status: AC
  Administered 2015-03-31: 10:00:00 via INTRAVENOUS
  Filled 2015-03-31: qty 4

## 2015-03-31 MED ORDER — MORPHINE SULFATE 4 MG/ML IJ SOLN
INTRAMUSCULAR | Status: AC
  Filled 2015-03-31: qty 1

## 2015-03-31 MED ORDER — PANTOPRAZOLE SODIUM 40 MG PO TBEC: 40.0000 mg | DELAYED_RELEASE_TABLET | Freq: Every day | ORAL | Status: AC

## 2015-03-31 NOTE — Patient Instructions (Signed)
Hypomagnesemia Magnesium is a common ion (mineral) in the body which is needed for metabolism. It is about how the body handles food and other chemical reactions necessary for life. Only about 2% of the magnesium in our body is found in the blood. When this is low, it is called hypomagnesemia. The blood will measure only a tiny amount of the magnesium in our body. When it is low in our blood, it does not mean that the whole body supply is low. The normal serum concentration ranges from 1.8-2.5 mEq/L. When the level gets to be less than 1.0 mEq/L, a number of problems begin to happen.  CAUSES   Receiving intravenous fluids without magnesium replacement.  Loss of magnesium from the bowel by nasogastric suction.  Loss of magnesium from nausea and vomiting or severe diarrhea. Any of the inflammatory bowel conditions can cause this.  Abuse of alcohol often leads to low serum magnesium.  An inherited form of magnesium loss happens when the kidneys lose magnesium. This is called familial or primary hypomagnesemia.  Some medications such as diuretics also cause the loss of magnesium. SYMPTOMS  These following problems are worse if the changes in magnesium levels come on suddenly.  Tremor.  Confusion.  Muscle weakness.  Oversensitive to sights and sounds.  Sensitive reflexes.  Depression.  Muscular fibrillations.  Overreactivity of the nerves.  Irritability.  Psychosis.  Spasms of the hand muscles.  Tetany (where the muscles go into uncontrollable spasms). DIAGNOSIS  This condition can be diagnosed by blood tests. TREATMENT   In an emergency, magnesium can be given intravenously (by vein).  If the condition is less worrisome, it can be corrected by diet. High levels of magnesium are found in green leafy vegetables, peas, beans, and nuts among other things. It can also be given through medications by mouth.  If it is being caused by medications, changes can be made.  If  alcohol is a problem, help is available if there are difficulties giving it up. Document Released: 05/08/2005 Document Revised: 12/27/2013 Document Reviewed: 04/01/2008 [facility] Patient Information 2015 [place], [place]. This information is not intended to replace advice given to you by your health care provider. Make sure you discuss any questions you have with your health care provider.

## 2015-03-31 NOTE — Telephone Encounter (Signed)
Treatment room charge nurse reported patient is not receiving chemotherapy today.  IVF only.

## 2015-03-31 NOTE — Progress Notes (Signed)
Platelet count 83 today. Patient reports R chest pain on deep inspiration over last 2-3 weeks; emesis x3 last night without adequate po anti-emetics at home. OK per Dr. Benay Spice to refill zofran and protonix today. HR 112 today, other vitals stable. Dr. Benay Spice reviewed labs and last provider office note; hold chemo today, reschedule in 1 week. Verbal order obtained for 1 L NS and 8 mg zofran IVPB.   Selena Lesser, NP notified of patient's request for pain medication for R chest pain 5/10 and R shoulder pain 6/10. Also informed mag low at 1.4. NP placed new orders for IV mag and IV morphine. RN to follow up and monitor.

## 2015-03-31 NOTE — Telephone Encounter (Signed)
Lab results brought to Triage.  Patient scheduled infusion and results will be reviewed by infusion nurse.

## 2015-04-04 ENCOUNTER — Telehealth: Payer: Self-pay | Admitting: *Deleted

## 2015-04-04 NOTE — Telephone Encounter (Signed)
VM message received from patient @ 8:11 am stating he needs refill on his oxycodone.   TC back to patient. He states he needs refill on the oxycodone 10 mg tabs. He has only 2 left. This was last filled on 03/28/15 for 60 tabs. Kerry Peck states that the pain in his shoulder has gotten worse and he is taking 2 tabs every 4 hours ATC as well as his long acting Oyxcodone 60 mg every 8 hours. Needs refill on Oyxcodone 10 mg and perhaps increase in his long acting pain med dosage.  Please call patient when prescription is available.

## 2015-04-05 ENCOUNTER — Telehealth: Payer: Self-pay

## 2015-04-05 DIAGNOSIS — T451X5A Adverse effect of antineoplastic and immunosuppressive drugs, initial encounter: Secondary | ICD-10-CM

## 2015-04-05 DIAGNOSIS — C3490 Malignant neoplasm of unspecified part of unspecified bronchus or lung: Secondary | ICD-10-CM

## 2015-04-05 DIAGNOSIS — D6481 Anemia due to antineoplastic chemotherapy: Secondary | ICD-10-CM

## 2015-04-05 DIAGNOSIS — C7951 Secondary malignant neoplasm of bone: Secondary | ICD-10-CM

## 2015-04-05 DIAGNOSIS — R1310 Dysphagia, unspecified: Secondary | ICD-10-CM

## 2015-04-05 MED ORDER — OXYCODONE HCL 10 MG PO TABS: 10.0000 mg | ORAL_TABLET | Freq: Four times a day (QID) | ORAL | Status: DC | PRN

## 2015-04-05 NOTE — Addendum Note (Signed)
Addended by: Janace Hoard on: 04/05/2015 03:44 PM   Modules accepted: Orders

## 2015-04-05 NOTE — Telephone Encounter (Signed)
S/w Sonia Side: he has 74 of his long acting pain pills left. He should have 27 left if he took them as directed. He stated they do not last the 8 hours. S/w Dr Julien Nordmann and he says pt needs to take long acting as directed and take the short acting for when long acting wears off. Dr Julien Nordmann approved oxycodone 1 every 6 hours #60. Rx prepared for signature.

## 2015-04-05 NOTE — Telephone Encounter (Signed)
Stressed to pt that he needs to take the long acting on a schedule. Then the medication will be in his system on a level. Then he uses the short acting in between. Rx is ready for pickup.

## 2015-04-05 NOTE — Telephone Encounter (Signed)
Pt called requestin refill on oxycodone 10 mg tablets. It was last filled on 8/2 #60.

## 2015-04-07 ENCOUNTER — Emergency Department (HOSPITAL_COMMUNITY): Payer: Federal, State, Local not specified - PPO

## 2015-04-07 ENCOUNTER — Inpatient Hospital Stay (HOSPITAL_COMMUNITY)
Admission: EM | Admit: 2015-04-07 | Discharge: 2015-04-08 | DRG: 189 | Disposition: A | Discharge status: Home / Self Care | Payer: Federal, State, Local not specified - PPO | Attending: Internal Medicine | Admitting: Internal Medicine

## 2015-04-07 ENCOUNTER — Ambulatory Visit (HOSPITAL_BASED_OUTPATIENT_CLINIC_OR_DEPARTMENT_OTHER): Payer: Federal, State, Local not specified - PPO | Admitting: Physician Assistant

## 2015-04-07 ENCOUNTER — Inpatient Hospital Stay (HOSPITAL_COMMUNITY): Payer: Federal, State, Local not specified - PPO

## 2015-04-07 ENCOUNTER — Other Ambulatory Visit: Payer: Self-pay

## 2015-04-07 ENCOUNTER — Ambulatory Visit: Payer: Federal, State, Local not specified - PPO

## 2015-04-07 ENCOUNTER — Encounter: Payer: Self-pay | Admitting: Physician Assistant

## 2015-04-07 ENCOUNTER — Encounter (HOSPITAL_COMMUNITY): Payer: Self-pay | Admitting: Emergency Medicine

## 2015-04-07 ENCOUNTER — Other Ambulatory Visit (HOSPITAL_BASED_OUTPATIENT_CLINIC_OR_DEPARTMENT_OTHER): Payer: Federal, State, Local not specified - PPO

## 2015-04-07 VITALS — BP 127/61 | HR 111 | Resp 17 | Ht 74.0 in

## 2015-04-07 DIAGNOSIS — J189 Pneumonia, unspecified organism: Secondary | ICD-10-CM

## 2015-04-07 DIAGNOSIS — R630 Anorexia: Secondary | ICD-10-CM | POA: Diagnosis present

## 2015-04-07 DIAGNOSIS — J9691 Respiratory failure, unspecified with hypoxia: Secondary | ICD-10-CM | POA: Diagnosis present

## 2015-04-07 DIAGNOSIS — C3492 Malignant neoplasm of unspecified part of left bronchus or lung: Secondary | ICD-10-CM | POA: Diagnosis present

## 2015-04-07 DIAGNOSIS — D6481 Anemia due to antineoplastic chemotherapy: Secondary | ICD-10-CM

## 2015-04-07 DIAGNOSIS — R1310 Dysphagia, unspecified: Secondary | ICD-10-CM

## 2015-04-07 DIAGNOSIS — J9601 Acute respiratory failure with hypoxia: Secondary | ICD-10-CM | POA: Diagnosis present

## 2015-04-07 DIAGNOSIS — C7951 Secondary malignant neoplasm of bone: Secondary | ICD-10-CM | POA: Diagnosis present

## 2015-04-07 DIAGNOSIS — C787 Secondary malignant neoplasm of liver and intrahepatic bile duct: Secondary | ICD-10-CM | POA: Diagnosis present

## 2015-04-07 DIAGNOSIS — I10 Essential (primary) hypertension: Secondary | ICD-10-CM | POA: Diagnosis present

## 2015-04-07 DIAGNOSIS — E785 Hyperlipidemia, unspecified: Secondary | ICD-10-CM | POA: Diagnosis present

## 2015-04-07 DIAGNOSIS — R52 Pain, unspecified: Secondary | ICD-10-CM | POA: Diagnosis not present

## 2015-04-07 DIAGNOSIS — J9621 Acute and chronic respiratory failure with hypoxia: Secondary | ICD-10-CM | POA: Diagnosis present

## 2015-04-07 DIAGNOSIS — E118 Type 2 diabetes mellitus with unspecified complications: Secondary | ICD-10-CM

## 2015-04-07 DIAGNOSIS — C7931 Secondary malignant neoplasm of brain: Secondary | ICD-10-CM | POA: Diagnosis present

## 2015-04-07 DIAGNOSIS — Z79891 Long term (current) use of opiate analgesic: Secondary | ICD-10-CM

## 2015-04-07 DIAGNOSIS — C7A1 Malignant poorly differentiated neuroendocrine tumors: Secondary | ICD-10-CM

## 2015-04-07 DIAGNOSIS — C3490 Malignant neoplasm of unspecified part of unspecified bronchus or lung: Secondary | ICD-10-CM | POA: Diagnosis present

## 2015-04-07 DIAGNOSIS — D649 Anemia, unspecified: Secondary | ICD-10-CM

## 2015-04-07 DIAGNOSIS — R079 Chest pain, unspecified: Secondary | ICD-10-CM

## 2015-04-07 DIAGNOSIS — R0600 Dyspnea, unspecified: Secondary | ICD-10-CM | POA: Diagnosis not present

## 2015-04-07 DIAGNOSIS — J449 Chronic obstructive pulmonary disease, unspecified: Secondary | ICD-10-CM | POA: Diagnosis present

## 2015-04-07 DIAGNOSIS — E119 Type 2 diabetes mellitus without complications: Secondary | ICD-10-CM

## 2015-04-07 DIAGNOSIS — C7B8 Other secondary neuroendocrine tumors: Secondary | ICD-10-CM

## 2015-04-07 DIAGNOSIS — D61810 Antineoplastic chemotherapy induced pancytopenia: Secondary | ICD-10-CM

## 2015-04-07 DIAGNOSIS — T451X5A Adverse effect of antineoplastic and immunosuppressive drugs, initial encounter: Secondary | ICD-10-CM

## 2015-04-07 DIAGNOSIS — G8929 Other chronic pain: Secondary | ICD-10-CM | POA: Diagnosis present

## 2015-04-07 DIAGNOSIS — J701 Chronic and other pulmonary manifestations due to radiation: Secondary | ICD-10-CM | POA: Diagnosis present

## 2015-04-07 DIAGNOSIS — D6959 Other secondary thrombocytopenia: Secondary | ICD-10-CM | POA: Diagnosis present

## 2015-04-07 DIAGNOSIS — Z794 Long term (current) use of insulin: Secondary | ICD-10-CM | POA: Diagnosis not present

## 2015-04-07 DIAGNOSIS — M549 Dorsalgia, unspecified: Secondary | ICD-10-CM | POA: Diagnosis not present

## 2015-04-07 DIAGNOSIS — M6281 Muscle weakness (generalized): Secondary | ICD-10-CM | POA: Diagnosis not present

## 2015-04-07 DIAGNOSIS — Z87891 Personal history of nicotine dependence: Secondary | ICD-10-CM

## 2015-04-07 DIAGNOSIS — D696 Thrombocytopenia, unspecified: Secondary | ICD-10-CM | POA: Diagnosis present

## 2015-04-07 DIAGNOSIS — J9690 Respiratory failure, unspecified, unspecified whether with hypoxia or hypercapnia: Secondary | ICD-10-CM | POA: Insufficient documentation

## 2015-04-07 DIAGNOSIS — Z79899 Other long term (current) drug therapy: Secondary | ICD-10-CM | POA: Diagnosis not present

## 2015-04-07 LAB — COMPREHENSIVE METABOLIC PANEL
ALBUMIN: 2.8 g/dL — AB (ref 3.5–5.0)
ALT: 20 U/L (ref 17–63)
AST: 55 U/L — ABNORMAL HIGH (ref 15–41)
Alkaline Phosphatase: 285 U/L — ABNORMAL HIGH (ref 38–126)
Anion gap: 9 (ref 5–15)
BUN: 17 mg/dL (ref 6–20)
CO2: 32 mmol/L (ref 22–32)
CREATININE: 0.93 mg/dL (ref 0.61–1.24)
Calcium: 13.5 mg/dL (ref 8.9–10.3)
Chloride: 96 mmol/L — ABNORMAL LOW (ref 101–111)
GFR calc Af Amer: >60 mL/min (ref >60)
GFR calc non Af Amer: >60 mL/min (ref >60)
Glucose, Bld: 131 mg/dL — ABNORMAL HIGH (ref 65–99)
Potassium: 4 mmol/L (ref 3.5–5.1)
SODIUM: 137 mmol/L (ref 135–145)
Total Bilirubin: 0.6 mg/dL (ref 0.3–1.2)
Total Protein: 6.7 g/dL (ref 6.5–8.1)

## 2015-04-07 LAB — CBC WITH DIFFERENTIAL/PLATELET
BASO%: 0.2 % (ref 0.0–2.0)
BASOS ABS: 0 10*3/uL (ref 0.0–0.1)
Basophils Absolute: 0 10*3/uL (ref 0.0–0.1)
Basophils Relative: 0 % (ref 0–1)
EOS ABS: 0.1 10*3/uL (ref 0.0–0.5)
EOS ABS: 0.1 10*3/uL (ref 0.0–0.7)
EOS%: 1.1 % (ref 0.0–7.0)
Eosinophils Relative: 1 % (ref 0–5)
HCT: 21.6 % — ABNORMAL LOW (ref 39.0–52.0)
HEMATOCRIT: 24.4 % — AB (ref 38.4–49.9)
HEMOGLOBIN: 7.2 g/dL — AB (ref 13.0–17.0)
HGB: 8.1 g/dL — ABNORMAL LOW (ref 13.0–17.1)
LYMPH%: 8 % — ABNORMAL LOW (ref 14.0–49.0)
Lymphocytes Relative: 10 % — ABNORMAL LOW (ref 12–46)
Lymphs Abs: 0.6 10*3/uL — ABNORMAL LOW (ref 0.7–4.0)
MCH: 32.5 pg (ref 27.2–33.4)
MCH: 32.9 pg (ref 26.0–34.0)
MCHC: 33.2 g/dL (ref 32.0–36.0)
MCHC: 33.3 g/dL (ref 30.0–36.0)
MCV: 98 fL (ref 79.3–98.0)
MCV: 98.6 fL (ref 78.0–100.0)
MONO#: 0.8 10*3/uL (ref 0.1–0.9)
MONO%: 12.8 % (ref 0.0–14.0)
MONOS PCT: 11 % (ref 3–12)
Monocytes Absolute: 0.7 10*3/uL (ref 0.1–1.0)
NEUT#: 5.1 10*3/uL (ref 1.5–6.5)
NEUT%: 77.9 % — ABNORMAL HIGH (ref 39.0–75.0)
NEUTROS ABS: 4.7 10*3/uL (ref 1.7–7.7)
Neutrophils Relative %: 78 % — ABNORMAL HIGH (ref 43–77)
PLATELETS: 100 10*3/uL — AB (ref 140–400)
Platelets: 110 10*3/uL — ABNORMAL LOW (ref 150–400)
RBC: 2.49 10*6/uL — ABNORMAL LOW (ref 4.20–5.82)
RBC: 2.19 MIL/uL — ABNORMAL LOW (ref 4.22–5.81)
RDW: 18.1 % — ABNORMAL HIGH (ref 11.0–14.6)
RDW: 18 % — AB (ref 11.5–15.5)
WBC: 6.5 10*3/uL (ref 4.0–10.3)
WBC: 6.1 10*3/uL (ref 4.0–10.5)
lymph#: 0.5 10*3/uL — ABNORMAL LOW (ref 0.9–3.3)

## 2015-04-07 LAB — BLOOD GAS, ARTERIAL
ACID-BASE EXCESS: 8.6 mmol/L — AB (ref 0.0–2.0)
BICARBONATE: 33.4 meq/L — AB (ref 20.0–24.0)
DRAWN BY: 257701
O2 Content: 1.5 L/min
O2 SAT: 94.9 %
PCO2 ART: 51 mmHg — AB (ref 35.0–45.0)
PH ART: 7.431 (ref 7.350–7.450)
Patient temperature: 98.6
TCO2: 32.3 mmol/L (ref 0–100)
pO2, Arterial: 77.2 mmHg — ABNORMAL LOW (ref 80.0–100.0)

## 2015-04-07 LAB — COMPREHENSIVE METABOLIC PANEL (CC13)
ALK PHOS: 369 U/L — AB (ref 40–150)
ALT: 21 U/L (ref 0–55)
ANION GAP: 12 meq/L — AB (ref 3–11)
AST: 60 U/L — ABNORMAL HIGH (ref 5–34)
Albumin: 2.8 g/dL — ABNORMAL LOW (ref 3.5–5.0)
BUN: 15.5 mg/dL (ref 7.0–26.0)
CO2: 31 mEq/L — ABNORMAL HIGH (ref 22–29)
Calcium: 14.2 mg/dL (ref 8.4–10.4)
Chloride: 97 mEq/L — ABNORMAL LOW (ref 98–109)
Creatinine: 0.9 mg/dL (ref 0.7–1.3)
Glucose: 132 mg/dl (ref 70–140)
Potassium: 4.1 mEq/L (ref 3.5–5.1)
SODIUM: 139 meq/L (ref 136–145)
Total Bilirubin: 0.83 mg/dL (ref 0.20–1.20)
Total Protein: 6.8 g/dL (ref 6.4–8.3)

## 2015-04-07 LAB — EXPECTORATED SPUTUM ASSESSMENT W GRAM STAIN, RFLX TO RESP C

## 2015-04-07 LAB — TROPONIN I

## 2015-04-07 LAB — URINALYSIS, ROUTINE W REFLEX MICROSCOPIC
BILIRUBIN URINE: NEGATIVE
Glucose, UA: NEGATIVE mg/dL
Hgb urine dipstick: NEGATIVE
Ketones, ur: NEGATIVE mg/dL
Leukocytes, UA: NEGATIVE
Nitrite: NEGATIVE
PROTEIN: NEGATIVE mg/dL
SPECIFIC GRAVITY, URINE: 1.033 — AB (ref 1.005–1.030)
UROBILINOGEN UA: 0.2 mg/dL (ref 0.0–1.0)
pH: 6.5 (ref 5.0–8.0)

## 2015-04-07 LAB — GLUCOSE, CAPILLARY: Glucose-Capillary: 102 mg/dL — ABNORMAL HIGH (ref 65–99)

## 2015-04-07 LAB — I-STAT CG4 LACTIC ACID, ED: LACTIC ACID, VENOUS: 0.96 mmol/L (ref 0.5–2.0)

## 2015-04-07 LAB — PREPARE RBC (CROSSMATCH)

## 2015-04-07 LAB — STREP PNEUMONIAE URINARY ANTIGEN: Strep Pneumo Urinary Antigen: NEGATIVE

## 2015-04-07 LAB — BRAIN NATRIURETIC PEPTIDE: B Natriuretic Peptide: 109.4 pg/mL — ABNORMAL HIGH (ref 0.0–100.0)

## 2015-04-07 LAB — MAGNESIUM (CC13): MAGNESIUM: 1.2 mg/dL — AB (ref 1.5–2.5)

## 2015-04-07 MED ORDER — ATORVASTATIN CALCIUM 40 MG PO TABS
40.0000 mg | ORAL_TABLET | Freq: Every day | ORAL | Status: DC
  Administered 2015-04-08: 40 mg via ORAL
  Filled 2015-04-07: qty 1

## 2015-04-07 MED ORDER — VANCOMYCIN HCL IN DEXTROSE 1-5 GM/200ML-% IV SOLN
1000.0000 mg | Freq: Once | INTRAVENOUS | Status: AC
  Administered 2015-04-07: 1000 mg via INTRAVENOUS
  Filled 2015-04-07: qty 200

## 2015-04-07 MED ORDER — DEXTROSE 5 % IV SOLN
2.0000 g | INTRAVENOUS | Status: AC
  Administered 2015-04-07: 2 g via INTRAVENOUS
  Filled 2015-04-07: qty 2

## 2015-04-07 MED ORDER — SODIUM CHLORIDE 0.9 % IV SOLN: Freq: Once | INTRAVENOUS | Status: DC

## 2015-04-07 MED ORDER — SODIUM CHLORIDE 0.9 % IV SOLN
INTRAVENOUS | Status: DC
  Administered 2015-04-07: 100 mL/h via INTRAVENOUS

## 2015-04-07 MED ORDER — SODIUM CHLORIDE 0.9 % IV SOLN
Freq: Once | INTRAVENOUS | Status: AC
  Administered 2015-04-07: 100 mL/h via INTRAVENOUS

## 2015-04-07 MED ORDER — VANCOMYCIN HCL IN DEXTROSE 750-5 MG/150ML-% IV SOLN
750.0000 mg | Freq: Three times a day (TID) | INTRAVENOUS | Status: DC
  Administered 2015-04-08 (×2): 750 mg via INTRAVENOUS
  Filled 2015-04-07 (×3): qty 150

## 2015-04-07 MED ORDER — MOMETASONE FURO-FORMOTEROL FUM 100-5 MCG/ACT IN AERO
2.0000 | INHALATION_SPRAY | Freq: Two times a day (BID) | RESPIRATORY_TRACT | Status: DC
  Administered 2015-04-07 – 2015-04-08 (×2): 2 via RESPIRATORY_TRACT
  Filled 2015-04-07: qty 8.8

## 2015-04-07 MED ORDER — ZOLEDRONIC ACID 4 MG/5ML IV CONC
4.0000 mg | Freq: Once | INTRAVENOUS | Status: AC
  Administered 2015-04-07: 4 mg via INTRAVENOUS
  Filled 2015-04-07: qty 5

## 2015-04-07 MED ORDER — ALBUTEROL SULFATE (2.5 MG/3ML) 0.083% IN NEBU
2.5000 mg | INHALATION_SOLUTION | RESPIRATORY_TRACT | Status: DC | PRN
  Administered 2015-04-07: 2.5 mg via RESPIRATORY_TRACT
  Filled 2015-04-07: qty 3

## 2015-04-07 MED ORDER — POLYETHYLENE GLYCOL 3350 17 G PO PACK: 17.0000 g | PACK | Freq: Every day | ORAL | Status: DC | PRN

## 2015-04-07 MED ORDER — SODIUM CHLORIDE 0.9 % IJ SOLN
3.0000 mL | Freq: Two times a day (BID) | INTRAMUSCULAR | Status: DC
  Administered 2015-04-08: 3 mL via INTRAVENOUS

## 2015-04-07 MED ORDER — IOHEXOL 350 MG/ML SOLN
100.0000 mL | Freq: Once | INTRAVENOUS | Status: AC | PRN
  Administered 2015-04-07: 100 mL via INTRAVENOUS

## 2015-04-07 MED ORDER — SENNA 8.6 MG PO TABS
1.0000 | ORAL_TABLET | Freq: Two times a day (BID) | ORAL | Status: DC
  Administered 2015-04-08: 8.6 mg via ORAL
  Filled 2015-04-07: qty 1

## 2015-04-07 MED ORDER — SODIUM CHLORIDE 0.9 % IV BOLUS (SEPSIS)
1000.0000 mL | Freq: Once | INTRAVENOUS | Status: AC
  Administered 2015-04-07: 1000 mL via INTRAVENOUS

## 2015-04-07 MED ORDER — INSULIN GLARGINE 100 UNIT/ML SOLOSTAR PEN: 15.0000 [IU] | PEN_INJECTOR | Freq: Every day | SUBCUTANEOUS | Status: DC

## 2015-04-07 MED ORDER — ONDANSETRON HCL 4 MG/2ML IJ SOLN
4.0000 mg | Freq: Once | INTRAMUSCULAR | Status: AC
  Administered 2015-04-07: 4 mg via INTRAVENOUS
  Filled 2015-04-07: qty 2

## 2015-04-07 MED ORDER — INSULIN ASPART 100 UNIT/ML ~~LOC~~ SOLN
0.0000 [IU] | Freq: Three times a day (TID) | SUBCUTANEOUS | Status: DC
  Administered 2015-04-08 (×2): 1 [IU] via SUBCUTANEOUS

## 2015-04-07 MED ORDER — HYDROMORPHONE HCL 1 MG/ML IJ SOLN
1.0000 mg | INTRAMUSCULAR | Status: DC | PRN
  Administered 2015-04-07 – 2015-04-08 (×6): 1 mg via INTRAVENOUS
  Filled 2015-04-07 (×6): qty 1

## 2015-04-07 MED ORDER — ZOLEDRONIC ACID 5 MG/100ML IV SOLN: 5.0000 mg | Freq: Once | INTRAVENOUS | Status: DC

## 2015-04-07 MED ORDER — ALUM & MAG HYDROXIDE-SIMETH 200-200-20 MG/5ML PO SUSP: 30.0000 mL | Freq: Four times a day (QID) | ORAL | Status: DC | PRN

## 2015-04-07 MED ORDER — SUCRALFATE 1 GM/10ML PO SUSP
1.0000 g | Freq: Three times a day (TID) | ORAL | Status: DC
  Filled 2015-04-07 (×2): qty 10

## 2015-04-07 MED ORDER — ONDANSETRON HCL 4 MG PO TABS
8.0000 mg | ORAL_TABLET | Freq: Three times a day (TID) | ORAL | Status: DC | PRN
  Administered 2015-04-07: 8 mg via ORAL
  Filled 2015-04-07: qty 2

## 2015-04-07 MED ORDER — OXYCODONE HCL ER 20 MG PO T12A
60.0000 mg | EXTENDED_RELEASE_TABLET | Freq: Three times a day (TID) | ORAL | Status: DC
  Administered 2015-04-07 – 2015-04-08 (×4): 60 mg via ORAL
  Filled 2015-04-07 (×5): qty 3

## 2015-04-07 MED ORDER — DEXTROSE 5 % IV SOLN
2.0000 g | Freq: Three times a day (TID) | INTRAVENOUS | Status: DC
  Administered 2015-04-08 (×2): 2 g via INTRAVENOUS
  Filled 2015-04-07 (×3): qty 2

## 2015-04-07 MED ORDER — ENOXAPARIN SODIUM 40 MG/0.4ML ~~LOC~~ SOLN
40.0000 mg | SUBCUTANEOUS | Status: DC
  Administered 2015-04-07: 40 mg via SUBCUTANEOUS
  Filled 2015-04-07 (×2): qty 0.4

## 2015-04-07 MED ORDER — BISMUTH SUBSALICYLATE 262 MG/15ML PO SUSP
30.0000 mL | Freq: Four times a day (QID) | ORAL | Status: DC | PRN
  Filled 2015-04-07: qty 118

## 2015-04-07 MED ORDER — PANTOPRAZOLE SODIUM 40 MG PO TBEC
40.0000 mg | DELAYED_RELEASE_TABLET | Freq: Every day | ORAL | Status: DC
  Administered 2015-04-07 – 2015-04-08 (×2): 40 mg via ORAL
  Filled 2015-04-07 (×4): qty 1

## 2015-04-07 NOTE — Progress Notes (Signed)
ANTIBIOTIC CONSULT NOTE - INITIAL  Pharmacy Consult for vancomycin/ceftazidime Indication: HCAP  No Known Allergies  Patient Measurements:   Dosing Body Weight: 78 kg ( 03/23/15) CrCl is 94 ml/min ( normalized using weight from 03/23/15)  Vital Signs: Temp: 98.9 F (37.2 C) (08/12 1003) Temp Source: Oral (08/12 1003) BP: 158/93 mmHg (08/12 1500) Pulse Rate: 93 (08/12 1500) Intake/Output from previous day:   Intake/Output from this shift:    Labs:  Recent Labs  04/07/15 0855 04/07/15 0855 04/07/15 1033  WBC 6.5  --  6.1  HGB 8.1*  --  7.2*  PLT 100*  --  110*  CREATININE  --  0.9 0.93   CrCl cannot be calculated (Unknown ideal weight.).  No results for input(s): VANCOTROUGH, VANCOPEAK, VANCORANDOM, GENTTROUGH, GENTPEAK, GENTRANDOM, TOBRATROUGH, TOBRAPEAK, TOBRARND, AMIKACINPEAK, AMIKACINTROU, AMIKACIN in the last 72 hours.   Microbiology: Recent Results (from the past 720 hour(s))  Culture, blood (routine x 2)     Status: None (Preliminary result)   Collection Time: 04/07/15 10:28 AM  Result Value Ref Range Status   Specimen Description BLOOD BLOOD RIGHT FOREARM  Final   Special Requests   Final    BOTTLES DRAWN AEROBIC AND ANAEROBIC 4.5 ML Performed at [facility]    Culture PENDING  Incomplete   Report Status PENDING  Incomplete    Medical History: Past Medical History  Diagnosis Date  . Hypertension   . Smoking addiction   . Radiation 08/16/14-09/06/14    prophylatic cranial radiation 25 gray, chest, t-spine  35 gray  . Radiation 09/29/14-10/12/14    left hilar area 30 gray, right pelvis 30 gray, right lower rib cage area 30 gray  . Cancer     lung and liver  . 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 Lung cancer   . Liver cancer   . Diabetes mellitus without complication     Medications:  Scheduled:  . enoxaparin (LOVENOX) injection  40 mg Subcutaneous Q24H  . insulin aspart  0-9 Units Subcutaneous TID WC  . sodium chloride  3 mL  Intravenous Q12H  . [START ON 04/08/2015] vancomycin  750 mg Intravenous Q8H  . zoledronic acid (ZOMETA) IV  4 mg Intravenous Once   Assessment: Pt is a 54 yo male diagnosed with HCAP. 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 Pt transferred from [facility] for which pt was due to receive chemo today. Pt also with hypercalcemia for which he will receive zoledronic acid.   Goal of Therapy:  Vancomycin trough level 15-20 mcg/ml  Eradication of infection  Plan:  Follow up culture results  Sart vancomycin 1000 mg IV x1 in ER, then vancomycin 750 mg IV q8h. Start ceftazidime 2 gr IV q8h.  Royetta Asal PharmD, BCPS 04/07/15 15:07

## 2015-04-07 NOTE — Patient Instructions (Signed)
You are being transferred to the Emergency Department for further evaluation and management of your presenting symptoms

## 2015-04-07 NOTE — ED Provider Notes (Signed)
CSN: [id number]     Arrival date & time 04/07/15  1005 History   First MD Initiated Contact with Patient 04/07/15 1016     Chief Complaint  Patient presents with  . Shortness of Breath  . Emesis     (Consider location/radiation/quality/duration/timing/severity/associated sxs/prior Treatment) HPI Comments: Patient from cancer center with shortness of breath for the past one week. [place] concerned about pulmonary embolism. Patient with history of metastatic lung cancer to bones and liver. Status post last chemotherapy today. States he's had a cough or shortness of breath for the past week productive of white mucus. Since yesterday he's had diffuse anterior chest pain that is constant. Denies any fever. He's also had several episodes of nausea and vomiting since yesterday. States diarrhea at baseline. Denies abdominal pain. Denies any previous cardiac history. Labs at the [facility] showed hypercalcemia 14.  The history is provided by the patient and a relative.    Past Medical History  Diagnosis Date  . Hypertension   . Smoking addiction   . Radiation 08/16/14-09/06/14    prophylatic cranial radiation 25 gray, chest, t-spine  35 gray  . Radiation 09/29/14-10/12/14    left hilar area 30 gray, right pelvis 30 gray, right lower rib cage area 30 gray  . Cancer     lung and liver  . 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 Lung cancer   . Liver cancer   . Diabetes mellitus without complication    Past Surgical History  Procedure Laterality Date  . Arm surgery     Family History  Problem Relation Age of Onset  . Pancreatic cancer Mother    Social History  Substance Use Topics  . Smoking status: Former Smoker -- 0.75 packs/day for 30 years    Types: Cigarettes    Quit date: 06/03/2014  . Smokeless tobacco: Never Used  . Alcohol Use: 18.0 oz/week    30 Cans of beer per week     Comment: 6 beers per day x 5 days per week    Review of Systems  Constitutional: Positive  for activity change, appetite change and fatigue. Negative for fever.  HENT: Negative for congestion and rhinorrhea.   Respiratory: Positive for cough, chest tightness and shortness of breath.   Cardiovascular: Positive for chest pain and leg swelling.  Gastrointestinal: Positive for nausea and vomiting. Negative for abdominal pain.  Genitourinary: Negative for dysuria, urgency and hematuria.  Musculoskeletal: Positive for myalgias and arthralgias. Negative for neck pain.  Skin: Negative for rash.  Neurological: Positive for weakness. Negative for dizziness, light-headedness and headaches.  A complete 10 system review of systems was obtained and all systems are negative except as noted in the HPI and PMH.      Allergies  Review of patient's allergies indicates no known allergies.  Home Medications   Prior to Admission medications   Medication Sig Start Date End Date Taking? Authorizing Provider  albuterol (PROVENTIL HFA;VENTOLIN HFA) 108 (90 BASE) MCG/ACT inhaler Inhale 2 puffs into the lungs every 6 (six) hours as needed for wheezing. 08/09/12  Yes Leandrew Koyanagi, MD  alum & mag hydroxide-simeth (MAALOX/MYLANTA) 200-200-20 MG/5ML suspension Take 30 mLs by mouth every 6 (six) hours as needed for indigestion or heartburn (indigestion).   Yes Historical Provider, MD  atorvastatin (LIPITOR) 40 MG tablet Take 40 mg by mouth daily. 03/05/15  Yes Historical Provider, MD  benzocaine (ORAJEL) 10 % mucosal gel Use as directed 1 application in the mouth or throat 3 (  three) times daily as needed for mouth pain (mouth pain).   Yes Historical Provider, MD  bismuth subsalicylate (PEPTO BISMOL) 262 MG/15ML suspension Take 30 mLs by mouth every 6 (six) hours as needed for indigestion (indigestion).   Yes Historical Provider, MD  calcium-vitamin D (OSCAL WITH D) 500-200 MG-UNIT per tablet Take 1 tablet by mouth daily with breakfast.    Yes Historical Provider, MD  emollient (BIAFINE) cream Apply  topically as needed. Patient taking differently: Apply 1 application topically as needed. For radiation itch. 08/16/14  Yes Gery Pray, MD  Fluticasone-Salmeterol (ADVAIR) 250-50 MCG/DOSE AEPB Inhale 2 puffs into the lungs 2 (two) times daily as needed (wheezing).    Yes Historical Provider, MD  Insulin Glargine (LANTUS) 100 UNIT/ML Solostar Pen Inject 15 Units into the skin daily. 11/29/14  Yes Albertine Patricia, MD  Insulin Pen Needle (PEN NEEDLES 3/16") 31G X 5 MM MISC Use daily with your Lantus injection 11/29/14  Yes Silver Huguenin Elgergawy, MD  magnesium oxide (MAGNESIUM-OXIDE) 400 (241.3 MG) MG tablet Take 3 tablets (1,200 mg total) by mouth 3 (three) times daily. Patient taking differently: Take 1 tablet by mouth 3 (three) times daily.  03/03/15  Yes Susanne Borders, NP  metFORMIN (GLUCOPHAGE) 1000 MG tablet Take 1,000 mg by mouth 2 (two) times daily with a meal.   Yes Historical Provider, MD  Multiple Vitamins-Minerals (CENTRUM SILVER ADULT 50+ PO) Take 1 tablet by mouth daily.    Yes Historical Provider, MD  ondansetron (ZOFRAN) 8 MG tablet Take 1 tablet (8 mg total) by mouth every 8 (eight) hours as needed for nausea or vomiting (nause and vomiting). Disp. 20 tablets. 1 refill.  Called in to [facility] per Dr. Sondra Come on 11/24/14. 03/31/15  Yes Ladell Pier, MD  ONE [facility] ULTRA TEST test strip 1 each by Other route 3 (three) times daily.  04/11/14  Yes Historical Provider, MD  Oxycodone HCl 10 MG TABS Take 1 tablet (10 mg total) by mouth every 6 (six) hours as needed (for breakthrough pain). 04/05/15  Yes Curt Bears, MD  OxyCODONE HCl ER 60 MG T12A Take one tablet (60 mg) by mouth every 8 hours Patient taking differently: Take 60 mg by mouth every 8 (eight) hours.  03/14/15  Yes Curt Bears, MD  pantoprazole (PROTONIX) 40 MG tablet Take 1 tablet (40 mg total) by mouth daily. 03/31/15  Yes Ladell Pier, MD  polyethylene glycol [facility] / GLYCOLAX) packet Take 17 g by mouth daily. Patient  taking differently: Take 17 g by mouth daily as needed for mild constipation or moderate constipation.  11/29/14  Yes Albertine Patricia, MD  pyridOXINE (VITAMIN B-6) 100 MG tablet Take 100 mg by mouth daily.   Yes Historical Provider, MD  senna (SENOKOT) 8.6 MG TABS tablet TAKE ONE TABLET BY MOUTH TWICE DAILY Patient taking differently: TAKE 8.6 MG BY MOUTH TWICE DAILY 03/24/15  Yes Adrena E Johnson, PA-C  vitamin C (ASCORBIC ACID) 500 MG tablet Take 500 mg by mouth daily.   Yes Historical Provider, MD  furosemide (LASIX) 20 MG tablet Take 2 tablets by mouth daily or as instructed Patient not taking: Reported on 04/07/2015 01/06/15   Burnetta Sabin E Johnson, PA-C  levofloxacin (LEVAQUIN) 500 MG tablet Take 1 tablet (500 mg total) by mouth daily. Patient not taking: Reported on 04/07/2015 02/14/15   Ottie Glazier, PA-C  potassium chloride SA (K-DUR,KLOR-CON) 20 MEQ tablet Take 1 tablet (20 mEq total) by mouth daily. Patient not taking: Reported on  04/07/2015 01/06/15   Carlton Adam, PA-C  sucralfate (CARAFATE) 1 GM/10ML suspension Take 10 mLs (1 g total) by mouth 4 (four) times daily -  with meals and at bedtime. Patient not taking: Reported on 04/07/2015 12/27/14   Thea Silversmith, MD   BP 162/90 mmHg  Pulse 96  Temp(Src) 99.3 F (37.4 C) (Oral)  Resp 16  SpO2 96% Physical Exam  Constitutional: He is oriented to person, place, and time. He appears well-developed and well-nourished. He appears distressed.  Cachectic, ill-appearing, muscle wasting  HENT:  Head: Normocephalic and atraumatic.  Mouth/Throat: Oropharynx is clear and moist. No oropharyngeal exudate.  Dry mucous membranes  Eyes: Conjunctivae and EOM are normal. Pupils are equal, round, and reactive to light.  Neck: Normal range of motion. Neck supple.  Cardiovascular: Normal rate and normal heart sounds.   Tachycardic  Pulmonary/Chest: Effort normal and breath sounds normal.  Decreased breath sounds at the bases  Abdominal: Soft.  There is no tenderness. There is no rebound and no guarding.  Musculoskeletal: Normal range of motion. He exhibits edema.  +2 pedal edema bilaterally  Neurological: He is alert and oriented to person, place, and time. No cranial nerve deficit.  No facial asymmetry, moving all extremities    ED Course  Procedures (including critical care time) Labs Review Labs Reviewed  CBC WITH DIFFERENTIAL/PLATELET - Abnormal; Notable for the following:    RBC 2.19 (*)    Hemoglobin 7.2 (*)    HCT 21.6 (*)    RDW 18.0 (*)    Platelets 110 (*)    Neutrophils Relative % 78 (*)    Lymphocytes Relative 10 (*)    Lymphs Abs 0.6 (*)    All other components within normal limits  COMPREHENSIVE METABOLIC PANEL - Abnormal; Notable for the following:    Chloride 96 (*)    Glucose, Bld 131 (*)    Calcium 13.5 (*)    Albumin 2.8 (*)    AST 55 (*)    Alkaline Phosphatase 285 (*)    All other components within normal limits  BRAIN NATRIURETIC PEPTIDE - Abnormal; Notable for the following:    B Natriuretic Peptide 109.4 (*)    All other components within normal limits  URINALYSIS, ROUTINE W REFLEX MICROSCOPIC (NOT AT [facility]) - Abnormal; Notable for the following:    Specific Gravity, Urine 1.033 (*)    All other components within normal limits  BLOOD GAS, ARTERIAL - Abnormal; Notable for the following:    pCO2 arterial 51.0 (*)    pO2, Arterial 77.2 (*)    Bicarbonate 33.4 (*)    Acid-Base Excess 8.6 (*)    All other components within normal limits  GLUCOSE, CAPILLARY - Abnormal; Notable for the following:    Glucose-Capillary 102 (*)    All other components within normal limits  CULTURE, BLOOD (ROUTINE X 2)  CULTURE, BLOOD (ROUTINE X 2)  CULTURE, EXPECTORATED SPUTUM-ASSESSMENT  GRAM STAIN  TROPONIN I  LEGIONELLA ANTIGEN, URINE  STREP PNEUMONIAE URINARY ANTIGEN  CBC  BASIC METABOLIC PANEL  I-STAT CG4 LACTIC ACID, ED  I-STAT CG4 LACTIC ACID, ED  PREPARE RBC (CROSSMATCH)  TYPE AND SCREEN     Imaging Review Ct Angio Chest Pe W/cm &/or Wo Cm  04/07/2015   CLINICAL DATA:  Expand All Collapse All Pt brought over from cancer center for [facility], which are questioning PE. Pt has been vomiting over the past 3-4 days, but states "not much diarrhea". Pt is getting chemo for lung cancer.  Pt placed on O2 at Cancer center but not on continuous, Lung ca dx 01/2014 with chemo ongoing and xrt completed 09/2014, Surgery-none  EXAM: CT ANGIOGRAPHY CHEST WITH CONTRAST  TECHNIQUE: Multidetector CT imaging of the chest was performed using the standard protocol during bolus administration of intravenous contrast. Multiplanar CT image reconstructions and MIPs were obtained to evaluate the vascular anatomy.  CONTRAST:  159m OMNIPAQUE IOHEXOL 350 MG/ML SOLN  COMPARISON:  Chest radiograph, 04/07/2015.  Chest CT, 03/01/2015.  FINDINGS: Angiographic exam: Study somewhat limited by respiratory motion. Allowing this, there is no evidence of a pulmonary embolus. Thoracic aorta is normal in caliber. Minor calcified plaque along the aortic arch.  Thoracic inlet: No mass or enlarged lymph node. Thyroid unremarkable.  Mediastinum and hila: Heart is normal in size. There are dense coronary artery calcifications. No mediastinal mass or enlarged lymph node. There is increased soft tissue along the left hilar structures without a defined mass or adenopathy. Right hilum is unremarkable.  Lungs and pleura: Small left pleural effusion. There is ground-glass opacity surrounding the left hilum in the left upper lobe and left lower lobe medially mostly in the superior segment. This is likely radiation induced inflammation/scarring. There is opacity along the pleural margin of the left upper lobe anteriorly, also the level of the hilum which may reflect radiation induced change. Metastatic disease is possible. The most nodular area this measures 10 mm in size. There is a lobulated nodule with some spiculation to its margins in the left lower  lobe, image 55, series 7, measuring 16.6 x 13.4 mm. Is similar to the prior CT. Just below this along the peripheral margin of the left lower lobe is a 13 mm irregular focal opacity, pleural based, not evident on the prior CT. This could reflect atelectasis. Neoplastic disease is possible but given the rapid change, is felt unlikely. There is another area of more ill-defined focal airspace consolidation in the left lower lobe, image 64, measuring 2.6 x 1.5 cm. It lies peripheral to an area of opacity along the inferior left hilum. There are additional linear and coarse reticular opacities in the left upper lobe which may reflect atelectasis. There is some mild reticular opacity in the medial anterior right middle lobe and medial right lower lobe, likely atelectasis or post radiation change. No discrete right lung mass or nodule.  Limited upper abdomen:  Unremarkable.  Musculoskeletal: Extensive bony metastatic disease. Multiple pathologic rib fractures and vertebral fractures. Findings are similar to the prior CT.  Review of the MIP images confirms the above findings.  IMPRESSION: 1. No evidence of a pulmonary embolus. 2. Small left pleural effusion. Areas of left lung opacity has developed since prior CT, likely due to radiation induced inflammation/fibrosis. Primary left lower lobe mass is stable in size. Second masslike opacity in the posterior peripheral left lower lobe is new, but likely atelectasis or inflammatory in etiology. Small left pleural effusion. 3. No evidence of metastatic disease to the right lung. 4. Extensive bony metastatic disease in multiple pathologic fractures, stable.   Electronically Signed   By: DLajean ManesM.D.   On: 04/07/2015 12:00   Dg Chest Portable 1 View  04/07/2015   CLINICAL DATA:  New onset shortness of breath and RIGHT-sided chest pain this morning, history metastatic lung cancer on chemotherapy, hypertension  EXAM: PORTABLE CHEST - 1 VIEW  COMPARISON:  Portable exam 1105  hours compared to 02/15/2015 and correlated with CT chest of 03/01/2015  FINDINGS: Normal heart size.  New  LEFT hilar enlargement/adenopathy versus perihilar mass or post radiation therapy change.  Subsegmental atelectasis at LEFT mid lung.  Underlying emphysematous changes.  Remaining lungs clear.  No pleural effusion or pneumothorax.  Probable old avulsion fracture off the inferior LEFT glenoid.  IMPRESSION: New abnormal appearance of the LEFT pulmonary hilum question adenopathy versus perihilar mass or sequela of prior radiation therapy.  Appearance is new since 03/01/2015.  Subsegmental atelectasis at LEFT mid lung, new  This could be better assessed by CT chest with contrast.   Electronically Signed   By: Lavonia Dana M.D.   On: 04/07/2015 11:22   I, Wyvonnia Dusky, Madilynn Montante, personally reviewed and evaluated these images and lab results as part of my medical decision-making.   EKG Interpretation   Date/Time:  Friday April 07 2015 10:25:43 EDT Ventricular Rate:  103 PR Interval:  156 QRS Duration: 101 QT Interval:  345 QTC Calculation: 452 R Axis:   74 Text Interpretation:  Sinus tachycardia Baseline wander in lead(s) V5 Rate  faster Confirmed by [place]  MD, [place] ([id number]) on 04/07/2015 10:28:58 AM      MDM   Final diagnoses:  Hypercalcemia  HCAP (healthcare-associated pneumonia)   patient from cancer center with shortness of breath and chest pain since yesterday. New oxygen requirement. Hypercalcemia on outpatient labs.  CXR with questionable L perhilar mass. Labs with hypercalcemia, worsening anemia. No CO 2 retention. CT negative for PE but does show new opacity concerning for radiation changes.  Cannot rule out HCAP.antibiotics started after blood cultures obtained.  Aggressive IVF hydration for hypercalcemia.  Treat for HCAP. New O2 requirement and hypoxia. Admission d/w Dr. Cruzita Lederer.  Ezequiel Essex, MD 04/07/15 [phone]

## 2015-04-07 NOTE — ED Notes (Signed)
hospitalist MD at bedside. 

## 2015-04-07 NOTE — ED Notes (Signed)
Bed: LW78 Expected date:  Expected time:  Means of arrival:  Comments: Cancer center ptde-sating ?PE

## 2015-04-07 NOTE — ED Notes (Signed)
Pt brought over from cancer center for [facility], which are questioning PE.  Pt has been vomiting over the past 3-4 days, but states "not much diarrhea".  Pt is getting chemo for lung cancer.  Pt placed on O2 at Cancer center but not on continuous.

## 2015-04-07 NOTE — H&P (Addendum)
History and Physical    Kerry Peck:[id number] DOB: 07/18/1961 DOA: 04/07/2015  Referring physician: EDP PCP: [name], MD  Specialists: None  Chief Complaint: Shortness of breath  HPI: Kerry Peck is a 54 y.o. male with metastatic lung cancer to the liver and bones, chronic pain, and Type 2 Diabetes Mellitus presented to the ED from the cancer center for cough and shortness of breath x 2 weeks. The shortness of breath has persisted over the past year and has worsened acutely in the past week. The cough is productive with white phlegm. He also complains of central chest pain x 2 weeks that has worsened over the past week. This pain is worse when he coughs as well as worse when he moves. He notes that over the past week he has had difficulty walking up stairs due to shortness of breath. He is s/p radiation therapy that finished 09/06/14 and is currently receiving chemotherapy. His most recent chemotherapy treatment he thinks it was 03/31/15. He also complains of vomiting, dizziness, and light-headedness. Over the past few months he has noticed decreased appetite, balance issues, and a 15-20 lbs weight loss. He denies fever, melena, hematochezia, abdominal pain and dysuria.   While in the ED, he was found to be hypoxic and was placed on oxygen. His sats improved to 95 with 2L nasal cannula. He does not use oxygen at baseline. He was also found to be hypercalcemic at 13.5 and anemic with a Hgb of 7.2. 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 Patient was started on broad spectrum antibiotics for presumed HCAP. TRH asked for admission for hypoxic respiratory failure.   Review of Systems: 10 point review of system Otherwise negative except for specified in the HPI.  Past Medical History  Diagnosis Date  . Hypertension   . Smoking addiction   . Radiation  08/16/14-09/06/14    prophylatic cranial radiation 25 gray, chest, t-spine  35 gray  . Radiation 09/29/14-10/12/14    left hilar area 30 gray, right pelvis 30 gray, right lower rib cage area 30 gray  . Cancer     lung and liver  . 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 Lung cancer   . Liver cancer   . Diabetes mellitus without complication    Past Surgical History  Procedure Laterality Date  . Arm surgery     Social History:  reports that he quit smoking about 10 months ago. His smoking use included Cigarettes. He has a 22.5 pack-year smoking history. He has never used smokeless tobacco. He reports he stopped drinking alcohol 10 months ago. He reports that he does not use illicit drugs.  No Known Allergies  Family History  Problem Relation Age of Onset  . Pancreatic cancer Mother     Prior to Admission medications   Medication Sig Start Date End Date Taking? Authorizing Provider  albuterol (PROVENTIL HFA;VENTOLIN HFA) 108 (90 BASE) MCG/ACT inhaler Inhale 2 puffs into the lungs every 6 (six) hours as needed for wheezing. 08/09/12  Yes Leandrew Koyanagi, MD  alum & mag hydroxide-simeth (MAALOX/MYLANTA) 200-200-20 MG/5ML suspension Take 30 mLs by mouth every 6 (six) hours as needed for indigestion or heartburn (indigestion).   Yes Historical Provider, MD  atorvastatin (LIPITOR) 40 MG tablet Take 40 mg by mouth daily. 03/05/15  Yes Historical Provider, MD  benzocaine (ORAJEL) 10 % mucosal gel Use  as directed 1 application in the mouth or throat 3 (three) times daily as needed for mouth pain (mouth pain).   Yes Historical Provider, MD  bismuth subsalicylate (PEPTO BISMOL) 262 MG/15ML suspension Take 30 mLs by mouth every 6 (six) hours as needed for indigestion (indigestion).   Yes Historical Provider, MD  calcium-vitamin D (OSCAL WITH D) 500-200 MG-UNIT per tablet Take 1 tablet by mouth daily with breakfast.    Yes Historical Provider, MD  emollient (BIAFINE) cream Apply topically as  needed. Patient taking differently: Apply 1 application topically as needed. For radiation itch. 08/16/14  Yes Gery Pray, MD  Fluticasone-Salmeterol (ADVAIR) 250-50 MCG/DOSE AEPB Inhale 2 puffs into the lungs 2 (two) times daily as needed (wheezing).    Yes Historical Provider, MD  Insulin Glargine (LANTUS) 100 UNIT/ML Solostar Pen Inject 15 Units into the skin daily. 11/29/14  Yes Albertine Patricia, MD  Insulin Pen Needle (PEN NEEDLES 3/16") 31G X 5 MM MISC Use daily with your Lantus injection 11/29/14  Yes Silver Huguenin Elgergawy, MD  magnesium oxide (MAGNESIUM-OXIDE) 400 (241.3 MG) MG tablet Take 3 tablets (1,200 mg total) by mouth 3 (three) times daily. Patient taking differently: Take 1 tablet by mouth 3 (three) times daily.  03/03/15  Yes Susanne Borders, NP  metFORMIN (GLUCOPHAGE) 1000 MG tablet Take 1,000 mg by mouth 2 (two) times daily with a meal.   Yes Historical Provider, MD  Multiple Vitamins-Minerals (CENTRUM SILVER ADULT 50+ PO) Take 1 tablet by mouth daily.    Yes Historical Provider, MD  ondansetron (ZOFRAN) 8 MG tablet Take 1 tablet (8 mg total) by mouth every 8 (eight) hours as needed for nausea or vomiting (nause and vomiting). Disp. 20 tablets. 1 refill.  Called in to [facility] per Dr. Sondra Come on 11/24/14. 03/31/15  Yes Ladell Pier, MD  ONE [facility] ULTRA TEST test strip 1 each by Other route 3 (three) times daily.  04/11/14  Yes Historical Provider, MD  Oxycodone HCl 10 MG TABS Take 1 tablet (10 mg total) by mouth every 6 (six) hours as needed (for breakthrough pain). 04/05/15  Yes Curt Bears, MD  OxyCODONE HCl ER 60 MG T12A Take one tablet (60 mg) by mouth every 8 hours Patient taking differently: Take 60 mg by mouth every 8 (eight) hours.  03/14/15  Yes Curt Bears, MD  pantoprazole (PROTONIX) 40 MG tablet Take 1 tablet (40 mg total) by mouth daily. 03/31/15  Yes Ladell Pier, MD  polyethylene glycol [facility] / GLYCOLAX) packet Take 17 g by mouth daily. Patient taking  differently: Take 17 g by mouth daily as needed for mild constipation or moderate constipation.  11/29/14  Yes Albertine Patricia, MD  pyridOXINE (VITAMIN B-6) 100 MG tablet Take 100 mg by mouth daily.   Yes Historical Provider, MD  senna (SENOKOT) 8.6 MG TABS tablet TAKE ONE TABLET BY MOUTH TWICE DAILY Patient taking differently: TAKE 8.6 MG BY MOUTH TWICE DAILY 03/24/15  Yes Adrena E Johnson, PA-C  vitamin C (ASCORBIC ACID) 500 MG tablet Take 500 mg by mouth daily.   Yes Historical Provider, MD  furosemide (LASIX) 20 MG tablet Take 2 tablets by mouth daily or as instructed Patient not taking: Reported on 04/07/2015 01/06/15   Burnetta Sabin E Johnson, PA-C  levofloxacin (LEVAQUIN) 500 MG tablet Take 1 tablet (500 mg total) by mouth daily. Patient not taking: Reported on 04/07/2015 02/14/15   Ottie Glazier, PA-C  potassium chloride SA (K-DUR,KLOR-CON) 20 MEQ tablet Take 1 tablet (20  mEq total) by mouth daily. Patient not taking: Reported on 04/07/2015 01/06/15   Carlton Adam, PA-C  sucralfate (CARAFATE) 1 GM/10ML suspension Take 10 mLs (1 g total) by mouth 4 (four) times daily -  with meals and at bedtime. Patient not taking: Reported on 04/07/2015 12/27/14   Thea Silversmith, MD   Physical Exam: Filed Vitals:   04/07/15 1003  BP: 144/86  Pulse: 111  Temp: 98.9 F (37.2 C)  TempSrc: Oral  Resp: 20  SpO2: 93%     GENERAL: Patient is seated in bed in no acute distressm. He has significant pallor and appears weak and mal-nourished.   HEENT: NCAT, PERRLA, dry mucus membranes  LUNGS: Diminished breath sounds throughout the left lung and base of right lung, normal respiratory effort on 2 L oxygen   HEART: Regular rate and rhythm without murmur. 2+ pulses, 1+ peripheral edema  ABDOMEN: Soft, nontender, and nondistended.  EXTREMITIES: Without any cyanosis, clubbing, rash, or lesions  NEUROLOGIC: He is alert and oriented x3. Strength 5/5 in all 4.   PSYCHIATRIC: Normal mood and affect  SKIN: No  ulceration or induration present.  Labs on Admission:  Basic Metabolic Panel:  Recent Labs Lab 04/07/15 0855 04/07/15 1033  NA 139 137  K 4.1 4.0  CL  --  96*  CO2 31* 32  GLUCOSE 132 131*  BUN 15.5 17  CREATININE 0.9 0.93  CALCIUM 14.2* 13.5*  MG 1.2*  --    Liver Function Tests:  Recent Labs Lab 04/07/15 0855 04/07/15 1033  AST 60* 55*  ALT 21 20  ALKPHOS 369* 285*  BILITOT 0.83 0.6  PROT 6.8 6.7  ALBUMIN 2.8* 2.8*   CBC:  Recent Labs Lab 04/07/15 0855 04/07/15 1033  WBC 6.5 6.1  NEUTROABS 5.1 4.7  HGB 8.1* 7.2*  HCT 24.4* 21.6*  MCV 98.0 98.6  PLT 100* 110*   Cardiac Enzymes:  Recent Labs Lab 04/07/15 1033  TROPONINI <0.03    BNP (last 3 results)  Recent Labs  04/07/15 1033  BNP 109.4*    Radiological Exams on Admission: Ct Angio Chest Pe W/cm &/or Wo Cm  04/07/2015   CLINICAL DATA:  Expand All Collapse All Pt brought over from cancer center for [facility], which are questioning PE. Pt has been vomiting over the past 3-4 days, but states "not much diarrhea". Pt is getting chemo for lung cancer. Pt placed on O2 at Cancer center but not on continuous, Lung ca dx 01/2014 with chemo ongoing and xrt completed 09/2014, Surgery-none  EXAM: CT ANGIOGRAPHY CHEST WITH CONTRAST  TECHNIQUE: Multidetector CT imaging of the chest was performed using the standard protocol during bolus administration of intravenous contrast. Multiplanar CT image reconstructions and MIPs were obtained to evaluate the vascular anatomy.  CONTRAST:  166m OMNIPAQUE IOHEXOL 350 MG/ML SOLN  COMPARISON:  Chest radiograph, 04/07/2015.  Chest CT, 03/01/2015.  FINDINGS: Angiographic exam: Study somewhat limited by respiratory motion. Allowing this, there is no evidence of a pulmonary embolus. Thoracic aorta is normal in caliber. Minor calcified plaque along the aortic arch.  Thoracic inlet: No mass or enlarged lymph node. Thyroid unremarkable.  Mediastinum and hila: Heart is normal in size. There are  dense coronary artery calcifications. No mediastinal mass or enlarged lymph node. There is increased soft tissue along the left hilar structures without a defined mass or adenopathy. Right hilum is unremarkable.  Lungs and pleura: Small left pleural effusion. There is ground-glass opacity surrounding the left hilum in the left upper  lobe and left lower lobe medially mostly in the superior segment. This is likely radiation induced inflammation/scarring. There is opacity along the pleural margin of the left upper lobe anteriorly, also the level of the hilum which may reflect radiation induced change. Metastatic disease is possible. The most nodular area this measures 10 mm in size. There is a lobulated nodule with some spiculation to its margins in the left lower lobe, image 55, series 7, measuring 16.6 x 13.4 mm. Is similar to the prior CT. Just below this along the peripheral margin of the left lower lobe is a 13 mm irregular focal opacity, pleural based, not evident on the prior CT. This could reflect atelectasis. Neoplastic disease is possible but given the rapid change, is felt unlikely. There is another area of more ill-defined focal airspace consolidation in the left lower lobe, image 64, measuring 2.6 x 1.5 cm. It lies peripheral to an area of opacity along the inferior left hilum. There are additional linear and coarse reticular opacities in the left upper lobe which may reflect atelectasis. There is some mild reticular opacity in the medial anterior right middle lobe and medial right lower lobe, likely atelectasis or post radiation change. No discrete right lung mass or nodule.  Limited upper abdomen:  Unremarkable.  Musculoskeletal: Extensive bony metastatic disease. Multiple pathologic rib fractures and vertebral fractures. Findings are similar to the prior CT.  Review of the MIP images confirms the above findings.  IMPRESSION: 1. No evidence of a pulmonary embolus. 2. Small left pleural effusion. Areas of  left lung opacity has developed since prior CT, likely due to radiation induced inflammation/fibrosis. Primary left lower lobe mass is stable in size. Second masslike opacity in the posterior peripheral left lower lobe is new, but likely atelectasis or inflammatory in etiology. Small left pleural effusion. 3. No evidence of metastatic disease to the right lung. 4. Extensive bony metastatic disease in multiple pathologic fractures, stable.   Electronically Signed   By: Lajean Manes M.D.   On: 04/07/2015 12:00   Dg Chest Portable 1 View  04/07/2015   CLINICAL DATA:  New onset shortness of breath and RIGHT-sided chest pain this morning, history metastatic lung cancer on chemotherapy, hypertension  EXAM: PORTABLE CHEST - 1 VIEW  COMPARISON:  Portable exam 1105 hours compared to 02/15/2015 and correlated with CT chest of 03/01/2015  FINDINGS: Normal heart size.  New LEFT hilar enlargement/adenopathy versus perihilar mass or post radiation therapy change.  Subsegmental atelectasis at LEFT mid lung.  Underlying emphysematous changes.  Remaining lungs clear.  No pleural effusion or pneumothorax.  Probable old avulsion fracture off the inferior LEFT glenoid.  IMPRESSION: New abnormal appearance of the LEFT pulmonary hilum question adenopathy versus perihilar mass or sequela of prior radiation therapy.  Appearance is new since 03/01/2015.  Subsegmental atelectasis at LEFT mid lung, new  This could be better assessed by CT chest with contrast.   Electronically Signed   By: Lavonia Dana M.D.   On: 04/07/2015 11:22    EKG: Independently reviewed. Sinus tachycardia.   Assessment/Plan Principal Problem:   Respiratory failure with hypoxia Active Problems:   Hypertension   Hyperlipidemia   Diabetes mellitus   Small cell lung cancer   Hypercalcemia of malignancy   Loss of appetite   Thrombocytopenia   Anemia   Acute hypoxic respiratory failure  - This is likely multifactorial given his history of lung cancer,  as well as several areas on the CT scan can be potentially radiation-induced,  also with anemia, less likely HCAP, however difficult to exclude at this point - Shortness of breath has been persistent over the past year and worsened acutely over the past week  - Patient  also appears to have underlying respiratory acidosis with compensatory metabolic alkalosis, mostly likely chronic in the setting of chronic tobacco abuse with probable underlying COPD.   - Chest CT findings were consistent with metastatic lung cancer in the left lung and also demonstrated a new left lung opacity with radiation induced fibrosis. Negative for PE.  - Possible HCAP, urine legionella and strep pneumo pending, sputum culture pending - On vancomycin and ceftazidime - On oxygen prn, nebs PRN - will order 2-D echo given progressive dyspnea on exertion, although less likely given lung findings  Anemia - Likely chemotherapy induced, no signs of overt bleeding, patient is anemic at baseline - Hgb 7.2, will transfuse 2 units of PRBC  Hypercalcemia of malignancy - Due to underlying malignancy and patient also reports taking calcium supplements - Hold calcium supplements, likely will need to hold indefinitely - IVF, lasix and zoledronic acid  Metastatic small cell lung cancer - Diagnosed July, 2015. Completed radiation 09/06/14. Most recent dose of chemotherapy 03/31/15 - Mets to the brain, bone and liver - Followed by oncology as an outpatient   Chronic pain - Likely due to metastatic lung cancer - resume his long-acting home medications, add Dilaudid PRN for pain. CT of the chest showed multiple areas of metastatic disease with pathological fractures in his ribs  Thrombocytopenia - Likely due to chemotherapy, thrombocytopenic at baseline - Plts 110, monitor, discontinue prophylactic Lovenox if they go less than 100   Type 2 Diabetes Mellitus, insulin dependent - patient is not taking Lantus at home, hold - SSI,  sensitive with meal coverage - Monitor CBGs  Hyperlipidemia - Continue atorvastatin  Generalized weakness - PT to evaluate   Diet: Carb modified Fluids: IVF NS DVT Prophylaxis: Lovenox  Code Status: Full Family Communication: None at bedside  Disposition Plan: Admit to inpatient  Fleet Contras, PA-S  Costin M. Cruzita Lederer, MD [facility] Pager [phone]  If 7PM-7AM, please contact night-coverage [email or web address] Password [email or web address] 04/07/2015, 2:29 PM

## 2015-04-07 NOTE — ED Notes (Signed)
Phone numbers for pt's family members.  Pt's father, Legrand Como [phone]. Noland Fordyce [phone]

## 2015-04-07 NOTE — ED Notes (Signed)
Patient has urinal at bedside- will let us know when he has provided a specimen

## 2015-04-07 NOTE — Progress Notes (Addendum)
[place] [phone]   [phone]  OFFICE PROGRESS NOTE  [name], MD [place] [address]  DIAGNOSIS: Extensive stage, Stage IV (T2a, N1, M1b) small cell lung cancer diagnosed in July of 2015.  PRIOR THERAPY:  1) Systemic chemotherapy with carboplatin for AUC of 5 on day 1 and etoposide at 120 mg/M2 on days 1, 2 and 3 with Neulasta support on day 4. Status post 6 cycles.  2) Prophylactic cranial irradiation under the care of Dr. Sondra Come. 3) Palliative radiotherapy to the T7-spine completed on 09/06/2014.  CURRENT THERAPY: Second line systemic chemotherapy with cisplatin 30 MG/M2 and irinotecan 65 MG/M2 on days 1 and 8 every 3 weeks, status post 4 cycles.  INTERVAL HISTORY: Kerry Peck 54 y.o. male returns to the clinic today for follow up visit. The patient is currently undergoing systemic chemotherapy with cisplatin and irinotecan is status post 4 cycles and tolerating his treatment fairly well. He is accompanied today by his niece in law. He reports having significant nausea and vomiting over the past several days and the family member notes the patient being delusional and having some increased weakness. Patient complains of right-sided chest pain particularly with deep breathing and increase shortness of breath even at rest. The symptoms have developed over the past week. He continues to have a dry nonproductive cough, denied any hemoptysis. He denied fever or chills.  For pain management he is currently on OxyContin 60 mg by mouth every 8 hours in addition to oxycodone 10 mg by mouth Q4-6 hours as needed for pain. He states that these measures have been insufficient to address his pain over the past several days. He presents for symptom management visit prior to the start of cycle #5. His initial vital signs revealed him to be hypoxic with an O2 sat of 85% on room air, after being placed on 2 L of oxygen via nasal  cannula on the oxygen saturation rate was 96% but he fluctuated between 90 and 96%.  MEDICAL HISTORY: Past Medical History  Diagnosis Date  . Hypertension   . Smoking addiction   . Radiation 08/16/14-09/06/14    prophylatic cranial radiation 25 gray, chest, t-spine  35 gray  . Radiation 09/29/14-10/12/14    left hilar area 30 gray, right pelvis 30 gray, right lower rib cage area 30 gray  . Cancer     lung and liver  . 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 Lung cancer   . Liver cancer   . Diabetes mellitus without complication     ALLERGIES:  has No Known Allergies.  MEDICATIONS:  No current facility-administered medications for this visit.   Current Outpatient Prescriptions  Medication Sig Dispense Refill  . albuterol (PROVENTIL HFA;VENTOLIN HFA) 108 (90 BASE) MCG/ACT inhaler Inhale 2 puffs into the lungs every 6 (six) hours as needed for wheezing. 1 Inhaler 0  . alum & mag hydroxide-simeth (MAALOX/MYLANTA) 200-200-20 MG/5ML suspension Take 30 mLs by mouth every 6 (six) hours as needed for indigestion or heartburn (indigestion).    . benzocaine (ORAJEL) 10 % mucosal gel Use as directed 1 application in the mouth or throat 3 (three) times daily as needed for mouth pain (mouth pain).    . bismuth subsalicylate (PEPTO BISMOL) 262 MG/15ML suspension Take 30 mLs by mouth every 6 (six) hours as needed for indigestion (indigestion).    . calcium-vitamin D (OSCAL WITH D) 500-200 MG-UNIT per tablet Take 1 tablet by  mouth daily with breakfast.     . emollient (BIAFINE) cream Apply topically as needed. (Patient taking differently: Apply 1 application topically as needed. For radiation itch.) 454 g 0  . Fluticasone-Salmeterol (ADVAIR) 250-50 MCG/DOSE AEPB Inhale 2 puffs into the lungs 2 (two) times daily as needed (wheezing).     . furosemide (LASIX) 20 MG tablet Take 2 tablets by mouth daily or as instructed (Patient not taking: Reported on 04/07/2015) 30 tablet 0  . Insulin Glargine (LANTUS)  100 UNIT/ML Solostar Pen Inject 15 Units into the skin daily. 15 mL 0  . Insulin Pen Needle (PEN NEEDLES 3/16") 31G X 5 MM MISC Use daily with your Lantus injection 90 each 0  . levofloxacin (LEVAQUIN) 500 MG tablet Take 1 tablet (500 mg total) by mouth daily. (Patient not taking: Reported on 04/07/2015) 7 tablet 0  . magnesium oxide (MAGNESIUM-OXIDE) 400 (241.3 MG) MG tablet Take 3 tablets (1,200 mg total) by mouth 3 (three) times daily. (Patient taking differently: Take 1 tablet by mouth 3 (three) times daily. ) 270 tablet 0  . metFORMIN (GLUCOPHAGE) 1000 MG tablet Take 1,000 mg by mouth 2 (two) times daily with a meal.    . Multiple Vitamins-Minerals (CENTRUM SILVER ADULT 50+ PO) Take 1 tablet by mouth daily.     . ondansetron (ZOFRAN) 8 MG tablet Take 1 tablet (8 mg total) by mouth every 8 (eight) hours as needed for nausea or vomiting (nause and vomiting). Disp. 20 tablets. 1 refill.  Called in to [facility] per Dr. Sondra Come on 11/24/14. 20 tablet 0  . ONE TOUCH ULTRA TEST test strip 1 each by Other route 3 (three) times daily.     . Oxycodone HCl 10 MG TABS Take 1 tablet (10 mg total) by mouth every 6 (six) hours as needed (for breakthrough pain). 60 tablet 0  . OxyCODONE HCl ER 60 MG T12A Take one tablet (60 mg) by mouth every 8 hours (Patient taking differently: Take 60 mg by mouth every 8 (eight) hours. ) 90 each 0  . pantoprazole (PROTONIX) 40 MG tablet Take 1 tablet (40 mg total) by mouth daily. 20 tablet 0  . polyethylene glycol (MIRALAX / GLYCOLAX) packet Take 17 g by mouth daily. (Patient taking differently: Take 17 g by mouth daily as needed for mild constipation or moderate constipation. ) 14 each 0  . potassium chloride SA (K-DUR,KLOR-CON) 20 MEQ tablet Take 1 tablet (20 mEq total) by mouth daily. (Patient not taking: Reported on 04/07/2015) 30 tablet 0  . pyridOXINE (VITAMIN B-6) 100 MG tablet Take 100 mg by mouth daily.    Marland Kitchen senna (SENOKOT) 8.6 MG TABS tablet TAKE ONE TABLET BY MOUTH  TWICE DAILY (Patient taking differently: TAKE 8.6 MG BY MOUTH TWICE DAILY) 120 tablet 0  . sucralfate (CARAFATE) 1 GM/10ML suspension Take 10 mLs (1 g total) by mouth 4 (four) times daily -  with meals and at bedtime. (Patient not taking: Reported on 04/07/2015) 420 mL 1  . vitamin C (ASCORBIC ACID) 500 MG tablet Take 500 mg by mouth daily.    Marland Kitchen atorvastatin (LIPITOR) 40 MG tablet Take 40 mg by mouth daily.  3   Facility-Administered Medications Ordered in Other Visits  Medication Dose Route Frequency Provider Last Rate Last Dose  . 0.9 %  sodium chloride infusion   Intravenous Continuous Costin Karlyne Greenspan, MD      . 0.9 %  sodium chloride infusion   Intravenous Once Costin Karlyne Greenspan, MD      .  cefTAZidime (FORTAZ) 2 g in dextrose 5 % 50 mL IVPB  2 g Intravenous STAT Ezequiel Essex, MD      . cefTAZidime (FORTAZ) 2 g in dextrose 5 % 50 mL IVPB  2 g Intravenous 3 times per day Costin Karlyne Greenspan, MD      . enoxaparin (LOVENOX) injection 40 mg  40 mg Subcutaneous Q24H Costin Karlyne Greenspan, MD      . HYDROmorphone (DILAUDID) injection 1 mg  1 mg Intravenous Q2H PRN Costin Karlyne Greenspan, MD      . insulin aspart (novoLOG) injection 0-9 Units  0-9 Units Subcutaneous TID WC Costin Karlyne Greenspan, MD      . sodium chloride 0.9 % injection 3 mL  3 mL Intravenous Q12H Costin Karlyne Greenspan, MD   3 mL at 04/07/15 1438  . vancomycin (VANCOCIN) IVPB 1000 mg/200 mL premix  1,000 mg Intravenous Once Ezequiel Essex, MD        REVIEW OF SYSTEMS:  Constitutional: positive for fatigue and malaise Eyes: negative Ears, nose, mouth, throat, and face: negative Respiratory: positive for cough, dyspnea on exertion and increased shortness of breath at rest and with exertion, right anterior chest pain Cardiovascular: negative Gastrointestinal: positive for nausea and vomiting Genitourinary:negative Integument/breast: negative Hematologic/lymphatic: negative Musculoskeletal:positive for back pain and bone pain Neurological:  negative Behavioral/Psych: positive for delerious at times and notes hallucinations Endocrine: negative Allergic/Immunologic: negative   PHYSICAL EXAMINATION: General appearance: alert, cooperative, slowed mentation and ill apearing Head: Normocephalic, without obvious abnormality, atraumatic Neck: no adenopathy, no JVD, supple, symmetrical, trachea midline and thyroid not enlarged, symmetric, no tenderness/mass/nodules Lymph nodes: Cervical, supraclavicular, and axillary nodes normal. Resp: clear to auscultation bilaterally Back: symmetric, no curvature. ROM normal. No CVA tenderness. Cardio: regular rate and rhythm, S1, S2 normal, no murmur, click, rub or gallop GI: soft, non-tender; bowel sounds normal; no masses,  no organomegaly Extremities: extremities normal, atraumatic, no cyanosis or edema Neurologic: Grossly normal  ECOG PERFORMANCE STATUS: 2 - Symptomatic, <50% confined to bed  Blood pressure 127/61, pulse 111, resp. rate 17, height '6\' 2"'$  (1.88 m), SpO2 98 %.  LABORATORY DATA: Lab Results  Component Value Date   WBC 6.1 04/07/2015   HGB 7.2* 04/07/2015   HCT 21.6* 04/07/2015   MCV 98.6 04/07/2015   PLT 110* 04/07/2015      Chemistry      Component Value Date/Time   NA 137 04/07/2015 1033   NA 139 04/07/2015 0855   K 4.0 04/07/2015 1033   K 4.1 04/07/2015 0855   CL 96* 04/07/2015 1033   CO2 32 04/07/2015 1033   CO2 31* 04/07/2015 0855   BUN 17 04/07/2015 1033   BUN 15.5 04/07/2015 0855   CREATININE 0.93 04/07/2015 1033   CREATININE 0.9 04/07/2015 0855      Component Value Date/Time   CALCIUM 13.5* 04/07/2015 1033   CALCIUM 14.2* 04/07/2015 0855   ALKPHOS 285* 04/07/2015 1033   ALKPHOS 369* 04/07/2015 0855   AST 55* 04/07/2015 1033   AST 60* 04/07/2015 0855   ALT 20 04/07/2015 1033   ALT 21 04/07/2015 0855   BILITOT 0.6 04/07/2015 1033   BILITOT 0.83 04/07/2015 0855       RADIOGRAPHIC STUDIES: Ct Angio Chest Pe W/cm &/or Wo Cm  04/07/2015    CLINICAL DATA:  Expand All Collapse All Pt brought over from cancer center for [facility], which are questioning PE. Pt has been vomiting over the past 3-4 days, but states "not much diarrhea". Pt is getting  chemo for lung cancer. Pt placed on O2 at Cancer center but not on continuous, Lung ca dx 01/2014 with chemo ongoing and xrt completed 09/2014, Surgery-none  EXAM: CT ANGIOGRAPHY CHEST WITH CONTRAST  TECHNIQUE: Multidetector CT imaging of the chest was performed using the standard protocol during bolus administration of intravenous contrast. Multiplanar CT image reconstructions and MIPs were obtained to evaluate the vascular anatomy.  CONTRAST:  140m OMNIPAQUE IOHEXOL 350 MG/ML SOLN  COMPARISON:  Chest radiograph, 04/07/2015.  Chest CT, 03/01/2015.  FINDINGS: Angiographic exam: Study somewhat limited by respiratory motion. Allowing this, there is no evidence of a pulmonary embolus. Thoracic aorta is normal in caliber. Minor calcified plaque along the aortic arch.  Thoracic inlet: No mass or enlarged lymph node. Thyroid unremarkable.  Mediastinum and hila: Heart is normal in size. There are dense coronary artery calcifications. No mediastinal mass or enlarged lymph node. There is increased soft tissue along the left hilar structures without a defined mass or adenopathy. Right hilum is unremarkable.  Lungs and pleura: Small left pleural effusion. There is ground-glass opacity surrounding the left hilum in the left upper lobe and left lower lobe medially mostly in the superior segment. This is likely radiation induced inflammation/scarring. There is opacity along the pleural margin of the left upper lobe anteriorly, also the level of the hilum which may reflect radiation induced change. Metastatic disease is possible. The most nodular area this measures 10 mm in size. There is a lobulated nodule with some spiculation to its margins in the left lower lobe, image 55, series 7, measuring 16.6 x 13.4 mm. Is similar to the  prior CT. Just below this along the peripheral margin of the left lower lobe is a 13 mm irregular focal opacity, pleural based, not evident on the prior CT. This could reflect atelectasis. Neoplastic disease is possible but given the rapid change, is felt unlikely. There is another area of more ill-defined focal airspace consolidation in the left lower lobe, image 64, measuring 2.6 x 1.5 cm. It lies peripheral to an area of opacity along the inferior left hilum. There are additional linear and coarse reticular opacities in the left upper lobe which may reflect atelectasis. There is some mild reticular opacity in the medial anterior right middle lobe and medial right lower lobe, likely atelectasis or post radiation change. No discrete right lung mass or nodule.  Limited upper abdomen:  Unremarkable.  Musculoskeletal: Extensive bony metastatic disease. Multiple pathologic rib fractures and vertebral fractures. Findings are similar to the prior CT.  Review of the MIP images confirms the above findings.  IMPRESSION: 1. No evidence of a pulmonary embolus. 2. Small left pleural effusion. Areas of left lung opacity has developed since prior CT, likely due to radiation induced inflammation/fibrosis. Primary left lower lobe mass is stable in size. Second masslike opacity in the posterior peripheral left lower lobe is new, but likely atelectasis or inflammatory in etiology. Small left pleural effusion. 3. No evidence of metastatic disease to the right lung. 4. Extensive bony metastatic disease in multiple pathologic fractures, stable.   Electronically Signed   By: DLajean ManesM.D.   On: 04/07/2015 12:00   Dg Chest Portable 1 View  04/07/2015   CLINICAL DATA:  New onset shortness of breath and RIGHT-sided chest pain this morning, history metastatic lung cancer on chemotherapy, hypertension  EXAM: PORTABLE CHEST - 1 VIEW  COMPARISON:  Portable exam 1105 hours compared to 02/15/2015 and correlated with CT chest of  03/01/2015  FINDINGS: Normal  heart size.  New LEFT hilar enlargement/adenopathy versus perihilar mass or post radiation therapy change.  Subsegmental atelectasis at LEFT mid lung.  Underlying emphysematous changes.  Remaining lungs clear.  No pleural effusion or pneumothorax.  Probable old avulsion fracture off the inferior LEFT glenoid.  IMPRESSION: New abnormal appearance of the LEFT pulmonary hilum question adenopathy versus perihilar mass or sequela of prior radiation therapy.  Appearance is new since 03/01/2015.  Subsegmental atelectasis at LEFT mid lung, new  This could be better assessed by CT chest with contrast.   Electronically Signed   By: Lavonia Dana M.D.   On: 04/07/2015 11:22    ASSESSMENT AND PLAN: this is a very pleasant 54 years old white male recently diagnosed with extensive stage small cell lung cancer status post 6 cycles of systemic chemotherapy with carboplatin and etoposide with partial response. This was followed by prophylactic cranial irradiation in addition to palliative radiotherapy to the T7 thoracic spine and the chest. Restaging CT scan of the Chest, Abdomen and pelvis showed evidence for further disease progression with progressive osseous metastasis and tumor canal encroachment involving the thoracic spine as well as development of hepatic metastasis and nodal metastasis within the upper abdomen. He is currently undergoing systemic chemotherapy with cisplatin and irinotecan is status post 4 cycles. He is tolerating the treatment well. Repeat CT scan of the chest, abdomen and pelvis showed mixed response with a stable pulmonary nodules as well as liver lesion except for enlarging right lobe of the liver lesion as well as development of periaortic metastatic adenopathy. He is presenting lethargic, and ill-appearing. Patient was discussed with and also seen by Dr. Julien Nordmann. We are concerned that he may have a pulmonary embolism. When his chemistries became available now some  relief hypercalcemia with a calcium of 14.2 and a low magnesium of 1.2. The patient was taken to the [facility] emergency department for further evaluation and management. Chemotherapy is on hold for the time being. For pain management, he was advised to take OxyContin as scheduled and oxycodone only for breakthrough pain. Follow-up appointment will be scheduled once his current symptom complex results. For the chemotherapy-induced anemia, we will continue to monitor his hemoglobin and hematocrit closely and consider the patient for PRBCs transfusion if his hemoglobin is less than 8 g.0/dL. The patient was advised to call immediately if he has any concerning symptoms in the interval. The patient voices understanding of current disease status and treatment options and is in agreement with the current care plan.  All questions were answered. The patient knows to call the clinic with any problems, questions or concerns. We can certainly see the patient much sooner if necessary.  Carlton Adam, PA-C 04/07/2015   ADDENDUM: Hematology/Oncology Attending: I had a face to face encounter with the patient. I recommended his care plan. This is a very pleasant 54 years old white male with extensive stage small cell lung cancer was currently undergoing second line systemic chemotherapy with reduced dose cisplatin and irinotecan status post 4 cycles. He was supposed to start cycle #5 today. Unfortunately the patient continues to complain of increasing fatigue and weakness as well as chest pain and shortness of breath. His oxygen saturation was low. I recommended for the patient to go immediately to the emergency department for further evaluation and to rule out pulmonary embolism and also for management of the hypercalcemia of malignancy. We will see him back for follow-up visit after discharge from the hospital for reevaluation before resuming his  systemic therapy.  Disclaimer: This note was dictated with  voice recognition software. Similar sounding words can inadvertently be transcribed and may not be corrected upon review. [name], MD 04/08/2015

## 2015-04-07 NOTE — Progress Notes (Signed)
Echocardiogram 2D Echocardiogram has been performed.  Kerry Peck 04/07/2015, 3:54 PM

## 2015-04-08 DIAGNOSIS — J9621 Acute and chronic respiratory failure with hypoxia: Principal | ICD-10-CM

## 2015-04-08 LAB — GLUCOSE, CAPILLARY
GLUCOSE-CAPILLARY: 119 mg/dL — AB (ref 65–99)
GLUCOSE-CAPILLARY: 150 mg/dL — AB (ref 65–99)
Glucose-Capillary: 153 mg/dL — ABNORMAL HIGH (ref 65–99)

## 2015-04-08 LAB — BASIC METABOLIC PANEL
Anion gap: 7 (ref 5–15)
BUN: 15 mg/dL (ref 6–20)
CHLORIDE: 95 mmol/L — AB (ref 101–111)
CO2: 34 mmol/L — ABNORMAL HIGH (ref 22–32)
Calcium: 12.6 mg/dL — ABNORMAL HIGH (ref 8.9–10.3)
Creatinine, Ser: 0.97 mg/dL (ref 0.61–1.24)
GLUCOSE: 136 mg/dL — AB (ref 65–99)
POTASSIUM: 3.3 mmol/L — AB (ref 3.5–5.1)
SODIUM: 136 mmol/L (ref 135–145)

## 2015-04-08 MED ORDER — LEVOFLOXACIN 500 MG PO TABS: 500.0000 mg | ORAL_TABLET | Freq: Every day | ORAL | Status: DC

## 2015-04-08 MED ORDER — OXYCODONE HCL 5 MG PO TABS
10.0000 mg | ORAL_TABLET | Freq: Once | ORAL | Status: AC
  Administered 2015-04-08: 10 mg via ORAL
  Filled 2015-04-08: qty 2

## 2015-04-08 MED ORDER — KETOROLAC TROMETHAMINE 30 MG/ML IJ SOLN
30.0000 mg | Freq: Once | INTRAMUSCULAR | Status: AC
  Administered 2015-04-08: 30 mg via INTRAVENOUS
  Filled 2015-04-08: qty 1

## 2015-04-08 MED ORDER — OXYCODONE HCL ER 60 MG PO T12A: EXTENDED_RELEASE_TABLET | ORAL | Status: DC

## 2015-04-08 MED ORDER — OXYCODONE HCL 10 MG PO TABS: 10.0000 mg | ORAL_TABLET | Freq: Four times a day (QID) | ORAL | Status: DC | PRN

## 2015-04-08 NOTE — Discharge Instructions (Signed)
Follow with Kerry Peck., MD in 5-7 days  Please get a complete blood count and chemistry panel checked by your Primary MD at your next visit, and again as instructed by your Primary MD. Please get your medications reviewed and adjusted by your Primary MD.  Please request your Primary MD to go over all Hospital Tests and Procedure/Radiological results at the follow up, please get all Hospital records sent to your Prim MD by signing hospital release before you go home.  If you had Pneumonia of Lung problems at the Hospital: Please get a 2 view Chest X ray done in 6-8 weeks after hospital discharge or sooner if instructed by your Primary MD.  If you have Congestive Heart Failure: Please call your Cardiologist or Primary MD anytime you have any of the following symptoms:  1) 3 pound weight gain in 24 hours or 5 pounds in 1 week  2) shortness of breath, with or without a dry hacking cough  3) swelling in the hands, feet or stomach  4) if you have to sleep on extra pillows at night in order to breathe  Follow cardiac low salt diet and 1.5 lit/day fluid restriction.  If you have diabetes Accuchecks 4 times/day, Once in AM empty stomach and then before each meal. Log in all results and show them to your primary doctor at your next visit. If any glucose reading is under 80 or above 300 call your primary MD immediately.  If you have Seizure/Convulsions/Epilepsy: Please do not drive, operate heavy machinery, participate in activities at heights or participate in high speed sports until you have seen by Primary MD or a Neurologist and advised to do so again.  If you had Gastrointestinal Bleeding: Please ask your Primary MD to check a complete blood count within one week of discharge or at your next visit. Your endoscopic/colonoscopic biopsies that are pending at the time of discharge, will also need to followed by your Primary MD.  Get Medicines reviewed and adjusted. Please take all your  medications with you for your next visit with your Primary MD  Please request your Primary MD to go over all hospital tests and procedure/radiological results at the follow up, please ask your Primary MD to get all Hospital records sent to his/her office.  If you experience worsening of your admission symptoms, develop shortness of breath, life threatening emergency, suicidal or homicidal thoughts you must seek medical attention immediately by calling 911 or calling your MD immediately  if symptoms less severe.  You must read complete instructions/literature along with all the possible adverse reactions/side effects for all the Medicines you take and that have been prescribed to you. Take any new Medicines after you have completely understood and accpet all the possible adverse reactions/side effects.   Do not drive or operate heavy machinery when taking Pain medications.   Do not take more than prescribed Pain, Sleep and Anxiety Medications  Special Instructions: If you have smoked or chewed Tobacco  in the last 2 yrs please stop smoking, stop any regular Alcohol  and or any Recreational drug use.  Wear Seat belts while driving.  Please note You were cared for by a hospitalist during your hospital stay. If you have any questions about your discharge medications or the care you received while you were in the hospital after you are discharged, you can call the unit and asked to speak with the hospitalist on call if the hospitalist that took care of you is not available. Once  you are discharged, your primary care physician will handle any further medical issues. Please note that NO REFILLS for any discharge medications will be authorized once you are discharged, as it is imperative that you return to your primary care physician (or establish a relationship with a primary care physician if you do not have one) for your aftercare needs so that they can reassess your need for medications and monitor your  lab values.  You can reach the hospitalist office at phone [phone] or fax [phone]   If you do not have a primary care physician, you can call [phone] for a physician referral.  Activity: As tolerated with Full fall precautions use walker/cane & assistance as needed  Diet: regular  Disposition Home

## 2015-04-08 NOTE — Progress Notes (Signed)
PT NOTE  SATURATION QUALIFICATIONS: (This note is used to comply with regulatory documentation for home oxygen)  Patient Saturations on Room Air at Rest = 90%  Patient Saturations on Room Air while Ambulating = 83%  Patient Saturations on 1.0 Liters of oxygen while Ambulating = 93%, 95% at rest  Kenyon Ana, PT Pager: [phone] 04/08/2015

## 2015-04-08 NOTE — Care Management Note (Signed)
Case Management Note  Patient Details  Name: KENDRICK HAAPALA MRN: [id number] Date of Birth: 11-Feb-1961  Subjective/Objective:                 Lung cancer   Action/Plan:  Home Health   Expected Discharge Date:  04/08/2015              Expected Discharge Plan:  [place]  In-House Referral:     Discharge planning Services  CM Consult  Post Acute Care Choice:  Home Health Choice offered to:  Patient  DME Arranged:  Oxygen DME Agency:  [place]:  PT [facility] Agency:  [place]  Status of Service:  Completed, signed off  Medicare Important Message Given:    Date Medicare IM Given:    Medicare IM give by:    Date Additional Medicare IM Given:    Additional Medicare Important Message give by:     If discussed at [place] of Stay Meetings, dates discussed:    Additional Comments: NCM spoke to pt and offered choice for [facility]. Pt requested [facility] for HH. Contacted [facility] rep for [facility]. Contacted DME rep for oxygen for home. Provided pt with [facility] agency list with contact number  Erenest Rasher, RN 04/08/2015, 2:13 PM

## 2015-04-08 NOTE — Discharge Summary (Signed)
Physician Discharge Summary  Kerry Peck:[id number] DOB: 1961-04-13 DOA: 04/07/2015  PCP: Kerry Kempf., MD  Admit date: 04/07/2015 Discharge date: 04/08/2015  Time spent: > 35 minutes  Recommendations for Outpatient Follow-up:  1. Follow up with Dr. Julien Nordmann in 1 week   Discharge Diagnoses:  Principal Problem:   Respiratory failure with hypoxia Active Problems:   Hypertension   Hyperlipidemia   Diabetes mellitus   Small cell lung cancer   Hypercalcemia of malignancy   Loss of appetite   Thrombocytopenia   Anemia  Discharge Condition: stable  Diet recommendation: regular  Filed Weights   04/08/15 1603  Weight: 80.015 kg (176 lb 6.4 oz)    History of present illness:  Kerry Peck is a 54 y.o. male with metastatic lung cancer to the liver and bones, chronic pain, and Type 2 Diabetes Mellitus presented to the ED from the cancer center for cough and shortness of breath x 2 weeks. The shortness of breath has persisted over the past year and has worsened acutely in the past week. The cough is productive with white phlegm. He also complains of central chest pain x 2 weeks that has worsened over the past week. This pain is worse when he coughs as well as worse when he moves. He notes that over the past week he has had difficulty walking up stairs due to shortness of breath. He is s/p radiation therapy that finished 09/06/14 and is currently receiving chemotherapy. His most recent chemotherapy treatment he thinks it was 03/31/15. He also complains of vomiting, dizziness, and light-headedness. Over the past few months he has noticed decreased appetite, balance issues, and a 15-20 lbs weight loss. He denies fever, melena, hematochezia, abdominal pain and dysuria. While in the ED, he was found to be hypoxic and was placed on oxygen. His sats improved to 95 with 2L nasal cannula. He does not use oxygen at baseline. He was also found to be hypercalcemic at 13.5 and anemic with a Hgb  of 7.2. 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 Patient was started on broad spectrum antibiotics for presumed HCAP. TRH asked for admission for hypoxic respiratory failure.   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 Negative for PE. His shortness of breath has been persistent over the past year and worsened acutely over the past week, and I suspect he had a component of chronic respiratory failure given his long standing tobacco abuse and COPD. He was stable on 1L [facility] maintaining good sats and was discharged home in stable condition. He was initially started empirically on HCAP coverage with Vancomycin and Ceftazidime, however clinical suspicion for infection was low, but to complete coverage he will finish a course of Levaquin. He also underwent 2D echo which showed normal EF and grade 1 diastolic dysfunction.  Anemia - Likely chemotherapy induced, no signs of overt bleeding, patient is anemic at baseline, Hgb 7.2 on admissin, improved adequately after 2U pRBC.  Hypercalcemia of malignancy - Due to underlying malignancy and patient also reports taking calcium supplements. He received IVF and zolendronic acid with improvement in his calcium levels. His home calcium supplements were  discontinued on discharge.  Metastatic small cell lung cancer - Diagnosed July, 2015. Completed radiation 09/06/14. Most recent dose of chemotherapy 03/31/15, outpatient follow up.  Chronic pain - Likely due to metastatic lung cancer, continue home regimen  Thrombocytopenia - Likely due to chemotherapy, close  outpatient follow up. Type 2 Diabetes Mellitus, insulin dependent - patient is not taking Lantus at home, hold for now.  Hyperlipidemia - Continue atorvastatin Generalized weakness - PT evaluated patient and recommended home health PT which was arranged prior to d/c   Procedures:  2D echo  Study Conclusions - Left ventricle: The cavity size was normal. Wall thickness was increased in a pattern of mild LVH. Systolic function was normal.The estimated ejection fraction was in the range of 50% to 55%.Wall motion was normal; there were no regional wall motionabnormalities. Doppler parameters are consistent with abnormalleft ventricular relaxation (grade 1 diastolic dysfunction).  Consultations:  None   Discharge Exam: Filed Vitals:   04/07/15 2345 04/08/15 0556 04/08/15 0755 04/08/15 1603  BP: 167/96 150/90    Pulse: 96 85 87   Temp: 99.6 F (37.6 C) 98.7 F (37.1 C)    TempSrc: Oral Oral    Resp: '24 22 20   '$ Height:      Weight:    80.015 kg (176 lb 6.4 oz)  SpO2: 96% 98% 96%    General: NAD Cardiovascular: RRR Respiratory: CTA biL  Discharge Instructions     Medication List    STOP taking these medications        calcium-vitamin D 500-200 MG-UNIT per tablet  Commonly known as:  OSCAL WITH D     CENTRUM SILVER ADULT 50+ PO     Insulin Glargine 100 UNIT/ML Solostar Pen  Commonly known as:  LANTUS      TAKE these medications        albuterol 108 (90 BASE) MCG/ACT inhaler  Commonly known as:  PROVENTIL HFA;VENTOLIN HFA  Inhale 2 puffs into the lungs every 6 (six) hours as needed for wheezing.     alum & mag hydroxide-simeth 200-200-20 MG/5ML suspension  Commonly known as:  MAALOX/MYLANTA  Take 30 mLs by mouth every 6 (six) hours as needed for indigestion or heartburn (indigestion).     atorvastatin 40 MG tablet  Commonly known as:  LIPITOR  Take 40 mg by mouth daily.     benzocaine 10 % mucosal gel  Commonly known as:  ORAJEL  Use as directed 1  application in the mouth or throat 3 (three) times daily as needed for mouth pain (mouth pain).     bismuth subsalicylate 628 BT/51VO suspension  Commonly known as:  PEPTO BISMOL  Take 30 mLs by mouth every 6 (six) hours as needed for indigestion (indigestion).     emollient cream  Commonly known as:  BIAFINE  Apply topically as needed.     Fluticasone-Salmeterol 250-50 MCG/DOSE Aepb  Commonly known as:  ADVAIR  Inhale 2 puffs into the lungs 2 (two) times daily as needed (wheezing).     furosemide 20 MG tablet  Commonly known as:  LASIX  Take 2 tablets by mouth daily or as instructed     levofloxacin 500 MG tablet  Commonly known as:  LEVAQUIN  Take 1 tablet (500 mg total) by mouth daily.     magnesium oxide 400 (241.3 MG) MG tablet  Commonly known as:  MAGNESIUM-OXIDE  Take 3 tablets (1,200 mg total) by mouth 3 (three) times daily.     metFORMIN 1000 MG  tablet  Commonly known as:  GLUCOPHAGE  Take 1,000 mg by mouth 2 (two) times daily with a meal.     ondansetron 8 MG tablet  Commonly known as:  ZOFRAN  Take 1 tablet (8 mg total) by mouth every 8 (eight) hours as needed for nausea or vomiting (nause and vomiting). Disp. 20 tablets. 1 refill.  Called in to [facility] per Dr. Sondra Come on 11/24/14.     ONE TOUCH ULTRA TEST test strip  Generic drug:  glucose blood  1 each by Other route 3 (three) times daily.     Oxycodone HCl 10 MG Tabs  Take 1 tablet (10 mg total) by mouth every 6 (six) hours as needed (for breakthrough pain).     OxyCODONE HCl ER 60 MG T12a  Take one tablet (60 mg) by mouth every 8 hours     pantoprazole 40 MG tablet  Commonly known as:  PROTONIX  Take 1 tablet (40 mg total) by mouth daily.     Pen Needles 3/16" 31G X 5 MM Misc  Use daily with your Lantus injection     polyethylene glycol packet  Commonly known as:  MIRALAX / GLYCOLAX  Take 17 g by mouth daily.     potassium chloride SA 20 MEQ tablet  Commonly known as:  K-DUR,KLOR-CON  Take 1  tablet (20 mEq total) by mouth daily.     pyridOXINE 100 MG tablet  Commonly known as:  VITAMIN B-6  Take 100 mg by mouth daily.     senna 8.6 MG Tabs tablet  Commonly known as:  SENOKOT  TAKE ONE TABLET BY MOUTH TWICE DAILY     sucralfate 1 GM/10ML suspension  Commonly known as:  CARAFATE  Take 10 mLs (1 g total) by mouth 4 (four) times daily -  with meals and at bedtime.     vitamin C 500 MG tablet  Commonly known as:  ASCORBIC ACID  Take 500 mg by mouth daily.           Follow-up Information    Follow up with [facility] K., MD. Schedule an appointment as soon as possible for a visit in 1 week.   Specialty:  Oncology   Contact information:   [address] [place] [address] [phone]       Follow up with [facility].   Why:  Home Health Physical Therapy   Contact information:   [address] [place] [address] [phone]       Follow up with [facility].   Why:  will deliver oxygen, please contact them if you do not receive a call in 3-4 hours once you get home.   Contact information:   [address] [place] [address] [phone]       The results of significant diagnostics from this hospitalization (including imaging, microbiology, ancillary and laboratory) are listed below for reference.    Significant Diagnostic Studies: Ct Angio Chest Pe W/cm &/or Wo Cm  04/07/2015   CLINICAL DATA:  Expand All Collapse All Pt brought over from cancer center for [facility], which are questioning PE. Pt has been vomiting over the past 3-4 days, but states "not much diarrhea". Pt is getting chemo for lung cancer. Pt placed on O2 at Cancer center but not on continuous, Lung ca dx 01/2014 with chemo ongoing and xrt completed 09/2014, Surgery-none  EXAM: CT ANGIOGRAPHY CHEST WITH CONTRAST  TECHNIQUE: Multidetector CT imaging of the  chest was performed using the standard protocol during bolus administration of  intravenous contrast. Multiplanar CT image reconstructions and MIPs were obtained to evaluate the vascular anatomy.  CONTRAST:  163m OMNIPAQUE IOHEXOL 350 MG/ML SOLN  COMPARISON:  Chest radiograph, 04/07/2015.  Chest CT, 03/01/2015.  FINDINGS: Angiographic exam: Study somewhat limited by respiratory motion. Allowing this, there is no evidence of a pulmonary embolus. Thoracic aorta is normal in caliber. Minor calcified plaque along the aortic arch.  Thoracic inlet: No mass or enlarged lymph node. Thyroid unremarkable.  Mediastinum and hila: Heart is normal in size. There are dense coronary artery calcifications. No mediastinal mass or enlarged lymph node. There is increased soft tissue along the left hilar structures without a defined mass or adenopathy. Right hilum is unremarkable.  Lungs and pleura: Small left pleural effusion. There is ground-glass opacity surrounding the left hilum in the left upper lobe and left lower lobe medially mostly in the superior segment. This is likely radiation induced inflammation/scarring. There is opacity along the pleural margin of the left upper lobe anteriorly, also the level of the hilum which may reflect radiation induced change. Metastatic disease is possible. The most nodular area this measures 10 mm in size. There is a lobulated nodule with some spiculation to its margins in the left lower lobe, image 55, series 7, measuring 16.6 x 13.4 mm. Is similar to the prior CT. Just below this along the peripheral margin of the left lower lobe is a 13 mm irregular focal opacity, pleural based, not evident on the prior CT. This could reflect atelectasis. Neoplastic disease is possible but given the rapid change, is felt unlikely. There is another area of more ill-defined focal airspace consolidation in the left lower lobe, image 64, measuring 2.6 x 1.5 cm. It lies peripheral to an area of opacity along the inferior left hilum. There are additional linear and coarse reticular opacities  in the left upper lobe which may reflect atelectasis. There is some mild reticular opacity in the medial anterior right middle lobe and medial right lower lobe, likely atelectasis or post radiation change. No discrete right lung mass or nodule.  Limited upper abdomen:  Unremarkable.  Musculoskeletal: Extensive bony metastatic disease. Multiple pathologic rib fractures and vertebral fractures. Findings are similar to the prior CT.  Review of the MIP images confirms the above findings.  IMPRESSION: 1. No evidence of a pulmonary embolus. 2. Small left pleural effusion. Areas of left lung opacity has developed since prior CT, likely due to radiation induced inflammation/fibrosis. Primary left lower lobe mass is stable in size. Second masslike opacity in the posterior peripheral left lower lobe is new, but likely atelectasis or inflammatory in etiology. Small left pleural effusion. 3. No evidence of metastatic disease to the right lung. 4. Extensive bony metastatic disease in multiple pathologic fractures, stable.   Electronically Signed   By: DLajean ManesM.D.   On: 04/07/2015 12:00   Dg Chest Portable 1 View  04/07/2015   CLINICAL DATA:  New onset shortness of breath and RIGHT-sided chest pain this morning, history metastatic lung cancer on chemotherapy, hypertension  EXAM: PORTABLE CHEST - 1 VIEW  COMPARISON:  Portable exam 1105 hours compared to 02/15/2015 and correlated with CT chest of 03/01/2015  FINDINGS: Normal heart size.  New LEFT hilar enlargement/adenopathy versus perihilar mass or post radiation therapy change.  Subsegmental atelectasis at LEFT mid lung.  Underlying emphysematous changes.  Remaining lungs clear.  No pleural effusion or pneumothorax.  Probable old avulsion fracture  off the inferior LEFT glenoid.  IMPRESSION: New abnormal appearance of the LEFT pulmonary hilum question adenopathy versus perihilar mass or sequela of prior radiation therapy.  Appearance is new since 03/01/2015.   Subsegmental atelectasis at LEFT mid lung, new  This could be better assessed by CT chest with contrast.   Electronically Signed   By: Lavonia Dana M.D.   On: 04/07/2015 11:22    Microbiology: Recent Results (from the past 240 hour(s))  Culture, blood (routine x 2)     Status: None (Preliminary result)   Collection Time: 04/07/15 10:28 AM  Result Value Ref Range Status   Specimen Description BLOOD BLOOD RIGHT FOREARM  Final   Special Requests BOTTLES DRAWN AEROBIC AND ANAEROBIC 4.5 ML  Final   Culture   Final    NO GROWTH 1 DAY Performed at [facility]    Report Status PENDING  Incomplete  Culture, blood (routine x 2)     Status: None (Preliminary result)   Collection Time: 04/07/15 11:25 AM  Result Value Ref Range Status   Specimen Description BLOOD LEFT FOREARM  Final   Special Requests BOTTLES DRAWN AEROBIC ONLY 5ML  Final   Culture   Final    NO GROWTH < 24 HOURS Performed at [facility]    Report Status PENDING  Incomplete  Culture, sputum-assessment     Status: None   Collection Time: 04/07/15  6:36 PM  Result Value Ref Range Status   Specimen Description Expect. Sput  Final   Special Requests NONE  Final   Sputum evaluation   Final    THIS SPECIMEN IS ACCEPTABLE. RESPIRATORY CULTURE REPORT TO FOLLOW.   Report Status 04/07/2015 FINAL  Final    Labs: Basic Metabolic Panel:  Recent Labs Lab 04/07/15 0855 04/07/15 1033 04/08/15 0440  NA 139 137 136  K 4.1 4.0 3.3*  CL  --  96* 95*  CO2 31* 32 34*  GLUCOSE 132 131* 136*  BUN 15.'5 17 15  '$ CREATININE 0.9 0.93 0.97  CALCIUM 14.2* 13.5* 12.6*  MG 1.2*  --   --    Liver Function Tests:  Recent Labs Lab 04/07/15 0855 04/07/15 1033  AST 60* 55*  ALT 21 20  ALKPHOS 369* 285*  BILITOT 0.83 0.6  PROT 6.8 6.7  ALBUMIN 2.8* 2.8*   CBC:  Recent Labs Lab 04/07/15 0855 04/07/15 1033 04/08/15 0440  WBC 6.5 6.1 6.5  NEUTROABS 5.1 4.7  --   HGB 8.1* 7.2* 8.6*  HCT 24.4* 21.6* 25.8*  MCV 98.0  98.6 91.8  PLT 100* 110* 91*   Cardiac Enzymes:  Recent Labs Lab 04/07/15 1033  TROPONINI <0.03   BNP: BNP (last 3 results)  Recent Labs  04/07/15 1033  BNP 109.4*   CBG:  Recent Labs Lab 04/07/15 1629 04/08/15 0745 04/08/15 1159 04/08/15 1640  GLUCAP 102* 119* 150* 153*    Signed:  Allene Furuya  [facility] 04/08/2015, 6:09 PM

## 2015-04-08 NOTE — Evaluation (Signed)
Physical Therapy Evaluation Patient Details Name: Kerry Peck MRN: [id number] DOB: Dec 14, 1960 Today's Date: 04/08/2015   History of Present Illness  54yo male adm 04/07/15 wtih ARF; PMHx: HTN, metastatic lung CA (mets to bone, liver)  Clinical Impression  Patient evaluated by Physical Therapy with no further acute PT needs identified. All education has been completed and the patient has no further questions.  See below for any follow-up Physial Therapy or equipment needs. PT is signing off. Thank you for this referral. See below for O2 s  SATURATION QUALIFICATIONS: (This note is used to comply with regulatory documentation for home oxygen)  Patient Saturations on Room Air at Rest = 90%  Patient Saturations on Room Air while Ambulating = 83%  Patient Saturations on 1.0 Liters of oxygen while Ambulating = 93%, 95% at rest         Follow Up Recommendations Home health PT    Equipment Recommendations  None recommended by PT    Recommendations for Other Services       Precautions / Restrictions        Mobility  Bed Mobility Overal bed mobility: Modified Independent                Transfers Overall transfer level: Needs assistance Equipment used: Rolling walker (2 wheeled) Transfers: Sit to/from Stand Sit to Stand: Min guard         General transfer comment: for safety, cues for hand placement; pt is unsteady on initial standing  Ambulation/Gait Ambulation/Gait assistance: Min guard;Supervision Ambulation Distance (Feet): 120 Feet Assistive device: Rolling walker (2 wheeled) Gait Pattern/deviations: Step-through pattern;Trunk flexed     General Gait Details: cues for position form RW  Stairs            Wheelchair Mobility    Modified Rankin (Stroke Patients Only)       Balance Overall balance assessment: Needs assistance           Standing balance-Leahy Scale: Fair                               Pertinent  Vitals/Pain Pain Assessment: 0-10 Pain Score: 2  Pain Location: right side Pain Descriptors / Indicators: Sore Pain Intervention(s): Limited activity within patient's tolerance;Premedicated before session;Repositioned    Home Living Family/patient expects to be discharged to:: Private residence Living Arrangements: Other relatives (sister) Available Help at Discharge: Family;Available PRN/intermittently Type of Home: House Home Access: Stairs to enter   Entrance Stairs-Number of Steps: 1 Home Layout: One level Home Equipment: Cane - single point;Walker - 2 wheels Additional Comments: sister works during day;     Prior Function Level of Independence: Independent with assistive device(s);Independent         Comments: amb with cane, sleeps a lot, does his own meals      Hand Dominance        Extremity/Trunk Assessment   Upper Extremity Assessment: Generalized weakness           Lower Extremity Assessment: Generalized weakness         Communication   Communication: No difficulties  Cognition Arousal/Alertness: Awake/alert Behavior During Therapy: WFL for tasks assessed/performed Overall Cognitive Status: No family/caregiver present to determine baseline cognitive functioning Area of Impairment: Following commands;Safety/judgement       Following Commands: Follows one step commands consistently Safety/Judgement: Decreased awareness of safety          General Comments  Exercises        Assessment/Plan    PT Assessment All further PT needs can be met in the next venue of care  PT Diagnosis Difficulty walking   PT Problem List    PT Treatment Interventions     PT Goals (Current goals can be found in the Care Plan section) Acute Rehab PT Goals Patient Stated Goal: home and  soon PT Goal Formulation: All assessment and education complete, DC therapy Potential to Achieve Goals: Good    Frequency     Barriers to discharge         Co-evaluation               End of Session Equipment Utilized During Treatment: Gait belt Activity Tolerance: Patient tolerated treatment well Patient left: in bed;with call bell/phone within reach           Time: 1470-9295 PT Time Calculation (min) (ACUTE ONLY): 27 min   Charges:   PT Evaluation $Initial PT Evaluation Tier I: 1 Procedure PT Treatments $Gait Training: 8-22 mins   PT G Codes:        Kerry Peck April 19, 2015, 1:44 PM

## 2015-04-09 LAB — TYPE AND SCREEN
ABO/RH(D): O POS
ANTIBODY SCREEN: NEGATIVE
Unit division: 0
Unit division: 0

## 2015-04-09 LAB — CBC
HEMATOCRIT: 25.8 % — AB (ref 39.0–52.0)
HEMOGLOBIN: 8.6 g/dL — AB (ref 13.0–17.0)
MCH: 30.6 pg (ref 26.0–34.0)
MCHC: 33.3 g/dL (ref 30.0–36.0)
MCV: 91.8 fL (ref 78.0–100.0)
Platelets: 91 10*3/uL — ABNORMAL LOW (ref 150–400)
RBC: 2.81 MIL/uL — AB (ref 4.22–5.81)
RDW: 19.5 % — AB (ref 11.5–15.5)
WBC: 6.5 10*3/uL (ref 4.0–10.5)

## 2015-04-10 ENCOUNTER — Ambulatory Visit: Payer: Federal, State, Local not specified - PPO | Admitting: Physician Assistant

## 2015-04-10 ENCOUNTER — Other Ambulatory Visit: Payer: Self-pay | Admitting: Nurse Practitioner

## 2015-04-10 DIAGNOSIS — D6481 Anemia due to antineoplastic chemotherapy: Secondary | ICD-10-CM

## 2015-04-10 DIAGNOSIS — C3490 Malignant neoplasm of unspecified part of unspecified bronchus or lung: Secondary | ICD-10-CM

## 2015-04-10 DIAGNOSIS — T451X5A Adverse effect of antineoplastic and immunosuppressive drugs, initial encounter: Secondary | ICD-10-CM

## 2015-04-10 DIAGNOSIS — R1310 Dysphagia, unspecified: Secondary | ICD-10-CM

## 2015-04-10 DIAGNOSIS — C7951 Secondary malignant neoplasm of bone: Secondary | ICD-10-CM

## 2015-04-10 LAB — LEGIONELLA ANTIGEN, URINE

## 2015-04-10 LAB — CULTURE, RESPIRATORY

## 2015-04-10 LAB — CULTURE, RESPIRATORY W GRAM STAIN: Culture: NORMAL

## 2015-04-12 ENCOUNTER — Telehealth: Payer: Self-pay | Admitting: Medical Oncology

## 2015-04-12 LAB — CULTURE, BLOOD (ROUTINE X 2)
CULTURE: NO GROWTH
Culture: NO GROWTH

## 2015-04-12 NOTE — Telephone Encounter (Signed)
Pt has to go out of town for family emergency . He needs Oxycodone   med refill. Per his pharmacy last refill for oxycodone 10 was 04/05/15.

## 2015-04-13 MED ORDER — OXYCODONE HCL 10 MG PO TABS: 10.0000 mg | ORAL_TABLET | Freq: Four times a day (QID) | ORAL | Status: DC | PRN

## 2015-04-13 NOTE — Addendum Note (Signed)
Addended by: Ardeen Garland on: 04/13/2015 12:01 PM   Modules accepted: Orders

## 2015-04-13 NOTE — Telephone Encounter (Signed)
Prescription locked up and pt notified it is ready for pick up by  pm

## 2015-04-14 ENCOUNTER — Telehealth: Payer: Self-pay | Admitting: Medical Oncology

## 2015-04-14 ENCOUNTER — Telehealth: Payer: Self-pay | Admitting: Internal Medicine

## 2015-04-14 DIAGNOSIS — C3490 Malignant neoplasm of unspecified part of unspecified bronchus or lung: Secondary | ICD-10-CM

## 2015-04-14 NOTE — Telephone Encounter (Signed)
s.w. pt and advised on 8.26 appt...pt ok and aware

## 2015-04-14 NOTE — Telephone Encounter (Signed)
Pt requests appt next Friday . Onc Tx request sent.

## 2015-04-17 ENCOUNTER — Telehealth: Payer: Self-pay | Admitting: *Deleted

## 2015-04-17 NOTE — Telephone Encounter (Signed)
Per staff message and POF I have scheduled appts. Advised scheduler of appts. JMW  

## 2015-04-19 ENCOUNTER — Inpatient Hospital Stay (HOSPITAL_COMMUNITY)
Admission: EM | Admit: 2015-04-19 | Discharge: 2015-04-28 | DRG: 948 | Disposition: A | Discharge status: Hospice | Payer: Federal, State, Local not specified - PPO | Attending: Internal Medicine | Admitting: Internal Medicine

## 2015-04-19 ENCOUNTER — Encounter (HOSPITAL_COMMUNITY): Payer: Self-pay | Admitting: Emergency Medicine

## 2015-04-19 DIAGNOSIS — Z923 Personal history of irradiation: Secondary | ICD-10-CM

## 2015-04-19 DIAGNOSIS — J449 Chronic obstructive pulmonary disease, unspecified: Secondary | ICD-10-CM | POA: Diagnosis present

## 2015-04-19 DIAGNOSIS — Z79891 Long term (current) use of opiate analgesic: Secondary | ICD-10-CM

## 2015-04-19 DIAGNOSIS — D6481 Anemia due to antineoplastic chemotherapy: Secondary | ICD-10-CM | POA: Diagnosis present

## 2015-04-19 DIAGNOSIS — Z6820 Body mass index (BMI) 20.0-20.9, adult: Secondary | ICD-10-CM

## 2015-04-19 DIAGNOSIS — D638 Anemia in other chronic diseases classified elsewhere: Secondary | ICD-10-CM | POA: Diagnosis present

## 2015-04-19 DIAGNOSIS — R109 Unspecified abdominal pain: Secondary | ICD-10-CM

## 2015-04-19 DIAGNOSIS — E440 Moderate protein-calorie malnutrition: Secondary | ICD-10-CM | POA: Diagnosis present

## 2015-04-19 DIAGNOSIS — C3432 Malignant neoplasm of lower lobe, left bronchus or lung: Secondary | ICD-10-CM | POA: Diagnosis present

## 2015-04-19 DIAGNOSIS — R52 Pain, unspecified: Secondary | ICD-10-CM | POA: Diagnosis present

## 2015-04-19 DIAGNOSIS — Z87891 Personal history of nicotine dependence: Secondary | ICD-10-CM

## 2015-04-19 DIAGNOSIS — Z79899 Other long term (current) drug therapy: Secondary | ICD-10-CM

## 2015-04-19 DIAGNOSIS — R627 Adult failure to thrive: Secondary | ICD-10-CM | POA: Diagnosis present

## 2015-04-19 DIAGNOSIS — G893 Neoplasm related pain (acute) (chronic): Secondary | ICD-10-CM | POA: Diagnosis present

## 2015-04-19 DIAGNOSIS — T40605A Adverse effect of unspecified narcotics, initial encounter: Secondary | ICD-10-CM | POA: Diagnosis not present

## 2015-04-19 DIAGNOSIS — G4700 Insomnia, unspecified: Secondary | ICD-10-CM | POA: Diagnosis present

## 2015-04-19 DIAGNOSIS — Z515 Encounter for palliative care: Secondary | ICD-10-CM

## 2015-04-19 DIAGNOSIS — E119 Type 2 diabetes mellitus without complications: Secondary | ICD-10-CM | POA: Diagnosis present

## 2015-04-19 DIAGNOSIS — D649 Anemia, unspecified: Secondary | ICD-10-CM | POA: Diagnosis present

## 2015-04-19 DIAGNOSIS — E876 Hypokalemia: Secondary | ICD-10-CM | POA: Diagnosis not present

## 2015-04-19 DIAGNOSIS — T451X5A Adverse effect of antineoplastic and immunosuppressive drugs, initial encounter: Secondary | ICD-10-CM | POA: Diagnosis present

## 2015-04-19 DIAGNOSIS — J9610 Chronic respiratory failure, unspecified whether with hypoxia or hypercapnia: Secondary | ICD-10-CM | POA: Diagnosis present

## 2015-04-19 DIAGNOSIS — Z8505 Personal history of malignant neoplasm of liver: Secondary | ICD-10-CM

## 2015-04-19 DIAGNOSIS — Z794 Long term (current) use of insulin: Secondary | ICD-10-CM

## 2015-04-19 DIAGNOSIS — Z66 Do not resuscitate: Secondary | ICD-10-CM | POA: Diagnosis present

## 2015-04-19 DIAGNOSIS — R531 Weakness: Secondary | ICD-10-CM

## 2015-04-19 DIAGNOSIS — I10 Essential (primary) hypertension: Secondary | ICD-10-CM | POA: Diagnosis present

## 2015-04-19 DIAGNOSIS — K5909 Other constipation: Secondary | ICD-10-CM | POA: Diagnosis not present

## 2015-04-19 DIAGNOSIS — I5032 Chronic diastolic (congestive) heart failure: Secondary | ICD-10-CM | POA: Diagnosis present

## 2015-04-19 DIAGNOSIS — C3490 Malignant neoplasm of unspecified part of unspecified bronchus or lung: Secondary | ICD-10-CM | POA: Diagnosis present

## 2015-04-19 DIAGNOSIS — C7951 Secondary malignant neoplasm of bone: Secondary | ICD-10-CM | POA: Diagnosis present

## 2015-04-19 MED ORDER — FENTANYL CITRATE (PF) 100 MCG/2ML IJ SOLN
50.0000 ug | INTRAMUSCULAR | Status: AC | PRN
  Administered 2015-04-19 (×2): 50 ug via INTRAVENOUS
  Filled 2015-04-19 (×2): qty 2

## 2015-04-19 NOTE — ED Notes (Signed)
Pt states he is hurting all over especially in his neck  Pt states he has not been eating for the past few days  Pt states he has just been laying around for the past few days

## 2015-04-19 NOTE — ED Provider Notes (Signed)
CSN: [id number]   Arrival date & time 04/19/15 1944  History  This chart was scribed for Rolland Porter, MD by Altamease Oiler, ED Scribe. This patient was seen in room WA06/WA06 and the patient's care was started at 12:14 AM.  Chief Complaint  Patient presents with  . generalized pain   . decreased appetite     HPI The history is provided by the patient. No language interpreter was used.   Kerry Peck is a 54 y.o. male with PMHx of metastatic lung cancer to the liver and bones, chronic pain, and Type 2 DM who presents to the Emergency Department complaining of constant diffuse pain with onset 1 week ago. The pain is in the abdomen, chest, back, shoulders, and ankles. His 60 mg Oxycodone has provided insufficient relief at home and he has discussed this with his doctor. Today his sister sent him to the ED because the pain caused him to still be in bed when she got off work this afternoon.  Associated symptoms include cough productive of yellow sputum with onset this week, decreased appetite, nausea, and 1 episode of emesis. No fever or diarrhea. He finished radiation in February and is currently on chemotherapy (last treatment was 4 weeks ago). He was scheduled for another treatment last week but it had to be delayed because of hypercalcemia and anemia (received 2 units of blood).The SOB that he was seen for earlier this month has improved with home oxygen.  Oncologist Dr Julien Nordmann  Past Medical History  Diagnosis Date  . Hypertension   . Smoking addiction   . Radiation 08/16/14-09/06/14    prophylatic cranial radiation 25 gray, chest, t-spine  35 gray  . Radiation 09/29/14-10/12/14    left hilar area 30 gray, right pelvis 30 gray, right lower rib cage area 30 gray  . Cancer     lung and liver  . 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 Lung cancer   . Liver cancer   . Diabetes mellitus without complication     Past Surgical History  Procedure Laterality Date  . Arm surgery      Family  History  Problem Relation Age of Onset  . Pancreatic cancer Mother     Social History  Substance Use Topics  . Smoking status: Former Smoker -- 0.75 packs/day for 30 years    Types: Cigarettes    Quit date: 06/03/2014  . Smokeless tobacco: Never Used  . Alcohol Use: No     Comment: former  lives at home His sister is staying with him right now   Review of Systems  Constitutional: Negative for fever.  Respiratory: Positive for cough and shortness of breath.   Cardiovascular: Positive for chest pain.  Musculoskeletal: Positive for myalgias and arthralgias.  All other systems reviewed and are negative.   Home Medications   Prior to Admission medications   Medication Sig Start Date End Date Taking? Authorizing Provider  albuterol (PROVENTIL HFA;VENTOLIN HFA) 108 (90 BASE) MCG/ACT inhaler Inhale 2 puffs into the lungs every 6 (six) hours as needed for wheezing. 08/09/12  Yes Leandrew Koyanagi, MD  alum & mag hydroxide-simeth (MAALOX/MYLANTA) 200-200-20 MG/5ML suspension Take 30 mLs by mouth every 6 (six) hours as needed for indigestion or heartburn (indigestion).   Yes Historical Provider, MD  benzocaine (ORAJEL) 10 % mucosal gel Use as directed 1 application in the mouth or throat 3 (three) times daily as needed for mouth pain (mouth pain).   Yes Historical Provider, MD  bismuth  subsalicylate (PEPTO BISMOL) 262 MG/15ML suspension Take 30 mLs by mouth every 6 (six) hours as needed for indigestion (indigestion).   Yes Historical Provider, MD  emollient (BIAFINE) cream Apply topically as needed. Patient taking differently: Apply 1 application topically as needed. For radiation itch. 08/16/14  Yes Gery Pray, MD  Fluticasone-Salmeterol (ADVAIR) 250-50 MCG/DOSE AEPB Inhale 2 puffs into the lungs 2 (two) times daily as needed (wheezing).    Yes Historical Provider, MD  MAGNESIUM-OXIDE 400 (241.3 MG) MG tablet TAKE 3 TABLETS(1200 MG) BY MOUTH THREE TIMES DAILY. 04/11/15  Yes Curt Bears,  MD  metFORMIN (GLUCOPHAGE) 1000 MG tablet Take 1,000 mg by mouth 2 (two) times daily with a meal.   Yes Historical Provider, MD  ondansetron (ZOFRAN) 8 MG tablet Take 1 tablet (8 mg total) by mouth every 8 (eight) hours as needed for nausea or vomiting (nause and vomiting). Disp. 20 tablets. 1 refill.  Called in to [facility] per Dr. Sondra Come on 11/24/14. 03/31/15  Yes Ladell Pier, MD  Oxycodone HCl 10 MG TABS Take 1 tablet (10 mg total) by mouth every 6 (six) hours as needed (for breakthrough pain). 04/13/15  Yes Curt Bears, MD  OxyCODONE HCl ER 60 MG T12A Take one tablet (60 mg) by mouth every 8 hours 04/08/15  Yes Costin Karlyne Greenspan, MD  pantoprazole (PROTONIX) 40 MG tablet Take 1 tablet (40 mg total) by mouth daily. 03/31/15  Yes Ladell Pier, MD  polyethylene glycol [facility] / GLYCOLAX) packet Take 17 g by mouth daily. Patient taking differently: Take 17 g by mouth daily as needed for mild constipation or moderate constipation.  11/29/14  Yes Albertine Patricia, MD  pyridOXINE (VITAMIN B-6) 100 MG tablet Take 100 mg by mouth daily.   Yes Historical Provider, MD  senna (SENOKOT) 8.6 MG TABS tablet TAKE ONE TABLET BY MOUTH TWICE DAILY Patient taking differently: TAKE 8.6 MG BY MOUTH TWICE DAILY 03/24/15  Yes Adrena E Johnson, PA-C  vitamin C (ASCORBIC ACID) 500 MG tablet Take 500 mg by mouth daily.   Yes Historical Provider, MD  furosemide (LASIX) 20 MG tablet Take 2 tablets by mouth daily or as instructed Patient not taking: Reported on 04/07/2015 01/06/15   Carlton Adam, PA-C  Insulin Pen Needle (PEN NEEDLES 3/16") 31G X 5 MM MISC Use daily with your Lantus injection 11/29/14   Silver Huguenin Elgergawy, MD  levofloxacin (LEVAQUIN) 500 MG tablet Take 1 tablet (500 mg total) by mouth daily. Patient not taking: Reported on 04/19/2015 04/08/15   Caren Griffins, MD  ONE TOUCH ULTRA TEST test strip 1 each by Other route 3 (three) times daily.  04/11/14   Historical Provider, MD  potassium chloride SA  (K-DUR,KLOR-CON) 20 MEQ tablet Take 1 tablet (20 mEq total) by mouth daily. Patient not taking: Reported on 04/07/2015 01/06/15   Carlton Adam, PA-C  sucralfate (CARAFATE) 1 GM/10ML suspension Take 10 mLs (1 g total) by mouth 4 (four) times daily -  with meals and at bedtime. Patient not taking: Reported on 04/07/2015 12/27/14   Thea Silversmith, MD    Allergies  Review of patient's allergies indicates no known allergies.  Triage Vitals: BP 135/80 mmHg  Pulse 106  Temp(Src) 98.1 F (36.7 C) (Oral)  Resp 22  SpO2 96%  Vital signs normal except for tachycardia   Physical Exam  Constitutional: He is oriented to person, place, and time. He appears well-developed and well-nourished.  Non-toxic appearance. He does not appear ill. No distress.  HENT:  Head: Normocephalic and atraumatic.  Right Ear: External ear normal.  Left Ear: External ear normal.  Nose: Nose normal. No mucosal edema or rhinorrhea.  Mouth/Throat: Oropharynx is clear and moist. Mucous membranes are dry. No dental abscesses or uvula swelling.  Eyes: Conjunctivae and EOM are normal. Pupils are equal, round, and reactive to light.  Neck: Normal range of motion and full passive range of motion without pain. Neck supple.  Cardiovascular: Normal rate, regular rhythm and normal heart sounds.  Exam reveals no gallop and no friction rub.   No murmur heard. Pulmonary/Chest: Effort normal and breath sounds normal. No respiratory distress. He has no wheezes. He has no rhonchi. He has no rales. He exhibits no tenderness and no crepitus.  Abdominal: Soft. Normal appearance and bowel sounds are normal. He exhibits no distension. There is hepatomegaly. There is tenderness. There is no rebound and no guarding.  Tender in the RUQ and across the epigastric area  Musculoskeletal: Normal range of motion. He exhibits no edema or tenderness.  Moves all extremities well.   Neurological: He is alert and oriented to person, place, and time. He  has normal strength. No cranial nerve deficit.  Skin: Skin is warm, dry and intact. No rash noted. No erythema. There is pallor.  Psychiatric: He has a normal mood and affect. His speech is normal and behavior is normal. His mood appears not anxious.  Nursing note and vitals reviewed.   ED Course  Procedures Medications  0.9 %  sodium chloride infusion ( Intravenous New Bag/Given 04/20/15 0441)  HYDROmorphone (DILAUDID) injection 1 mg (1 mg Intravenous Given 04/20/15 0441)  ondansetron (ZOFRAN) injection 4 mg (not administered)  feeding supplement (ENSURE ENLIVE) (ENSURE ENLIVE) liquid 237 mL (not administered)  fentaNYL (SUBLIMAZE) injection 50 mcg (50 mcg Intravenous Given 04/19/15 2202)  HYDROmorphone (DILAUDID) injection 1 mg (1 mg Intravenous Given 04/20/15 0035)  sodium chloride 0.9 % bolus 1,000 mL (0 mLs Intravenous Stopped 04/20/15 0241)  HYDROmorphone (DILAUDID) injection 1 mg (1 mg Intravenous Given 04/20/15 0305)     DIAGNOSTIC STUDIES: Oxygen Saturation is 96% on RA, normal by my interpretation.    COORDINATION OF CARE: 12:21 AM Discussed treatment plan which includes CXR, EKG, lab work, Dilaudid for pain, and IVF with pt at bedside and pt agreed to plan.  We'll review patient's records I do not see where he got blood transfused over the weekend as he said. He was admitted to the hospital on August 12 and he was discharged on the 13th. He did have blood typed and crossed on the 14th. However that was 10 days ago not a couple days ago as the patient indicated. He may have some confusion.  Due to the patient's persistent anemia and need for pain control with known metastatic lung cancer he was discussed with the hospitalist for admission.   03:15 Dr Hal Hope, admit to med-surg  Results for orders placed or performed during the hospital encounter of 04/19/15  Comprehensive metabolic panel  Result Value Ref Range   Sodium 136 135 - 145 mmol/L   Potassium 3.5 3.5 - 5.1 mmol/L    Chloride 99 (L) 101 - 111 mmol/L   CO2 27 22 - 32 mmol/L   Glucose, Bld 160 (H) 65 - 99 mg/dL   BUN 18 6 - 20 mg/dL   Creatinine, Ser 0.65 0.61 - 1.24 mg/dL   Calcium 10.4 (H) 8.9 - 10.3 mg/dL   Total Protein 6.3 (L) 6.5 - 8.1 g/dL  Albumin 2.5 (L) 3.5 - 5.0 g/dL   AST 137 (H) 15 - 41 U/L   ALT 43 17 - 63 U/L   Alkaline Phosphatase 758 (H) 38 - 126 U/L   Total Bilirubin 0.8 0.3 - 1.2 mg/dL   GFR calc non Af Amer >60 >60 mL/min   GFR calc Af Amer >60 >60 mL/min   Anion gap 10 5 - 15  CBC with Differential  Result Value Ref Range   WBC 7.9 4.0 - 10.5 K/uL   RBC 2.45 (L) 4.22 - 5.81 MIL/uL   Hemoglobin 7.7 (L) 13.0 - 17.0 g/dL   HCT 23.3 (L) 39.0 - 52.0 %   MCV 95.1 78.0 - 100.0 fL   MCH 31.4 26.0 - 34.0 pg   MCHC 33.0 30.0 - 36.0 g/dL   RDW 18.6 (H) 11.5 - 15.5 %   Platelets 114 (L) 150 - 400 K/uL   Neutrophils Relative % 75 43 - 77 %   Lymphocytes Relative 9 (L) 12 - 46 %   Monocytes Relative 14 (H) 3 - 12 %   Eosinophils Relative 2 0 - 5 %   Basophils Relative 0 0 - 1 %   Neutro Abs 5.9 1.7 - 7.7 K/uL   Lymphs Abs 0.7 0.7 - 4.0 K/uL   Monocytes Absolute 1.1 (H) 0.1 - 1.0 K/uL   Eosinophils Absolute 0.2 0.0 - 0.7 K/uL   Basophils Absolute 0.0 0.0 - 0.1 K/uL   RBC Morphology POLYCHROMASIA PRESENT   Urinalysis, Routine w reflex microscopic (not at [facility])  Result Value Ref Range   Color, Urine AMBER (A) YELLOW   APPearance CLOUDY (A) CLEAR   Specific Gravity, Urine 1.024 1.005 - 1.030   pH 7.0 5.0 - 8.0   Glucose, UA NEGATIVE NEGATIVE mg/dL   Hgb urine dipstick NEGATIVE NEGATIVE   Bilirubin Urine SMALL (A) NEGATIVE   Ketones, ur NEGATIVE NEGATIVE mg/dL   Protein, ur NEGATIVE NEGATIVE mg/dL   Urobilinogen, UA 1.0 0.0 - 1.0 mg/dL   Nitrite NEGATIVE NEGATIVE   Leukocytes, UA NEGATIVE NEGATIVE  Sample to Blood Bank  Result Value Ref Range   Blood Bank Specimen SAMPLE AVAILABLE FOR TESTING    Sample Expiration 04/23/2015    Laboratory interpretation all normal except  worsening of his anemia   I, Rolland Porter, MD, personally reviewed and evaluated these images and lab results as part of my medical decision-making.  Imaging Review   Dg Chest Portable 1 View  04/20/2015   CLINICAL DATA:  Cough with yellow mucus production. Shortness of breath. History of metastatic lung cancer.  EXAM: PORTABLE CHEST - 1 VIEW  COMPARISON:  Most recent chest imaging radiographs and CT 04/07/2015  FINDINGS: Cardiomediastinal contours are unchanged with stable left hilar prominence. Scarring or atelectasis in the left midlung zone with underlying nodularity, unchanged. No consolidation to suggest pneumonia. The small left pleural effusion on prior CT is not seen radiographically. No pneumothorax. Callus formation about multiple right-sided rib fractures. Left rib fractures that appear pathologic. Probable metastatic involvement of the left glenoid.  IMPRESSION: 1. No evidence of acute process or pneumonia. 2. Post treatment related change in the left hemithorax. Atelectasis/scarring and nodularity in the left midlung zone. There are bilateral rib fractures that appear pathologic, callus formation about the right-sided fractures, characterized previously on CT.   Electronically Signed   By: Jeb Levering M.D.   On: 04/20/2015 01:22   Ct Angio Chest Pe W/cm &/or Wo Cm  04/07/2015   CLINICAL  DATA:  Expand All Collapse All Pt brought over from cancer center for [facility], which are questioning PE. Pt has been vomiting over the past 3-4 days, but states "not much diarrhea". Pt is getting chemo for lung cancer. Pt placed on O2 at Cancer center but not on continuous, Lung ca dx 01/2014 with chemo ongoing and xrt completed 09/2014, Surgery-none  IMPRESSION: 1. No evidence of a pulmonary embolus. 2. Small left pleural effusion. Areas of left lung opacity has developed since prior CT, likely due to radiation induced inflammation/fibrosis. Primary left lower lobe mass is stable in size. Second masslike  opacity in the posterior peripheral left lower lobe is new, but likely atelectasis or inflammatory in etiology. Small left pleural effusion. 3. No evidence of metastatic disease to the right lung. 4. Extensive bony metastatic disease in multiple pathologic fractures, stable.   Electronically Signed   By: Lajean Manes M.D.   On: 04/07/2015 12:00    EKG Interpretation  Date/Time:  Wednesday April 19 2015 21:01:57 EDT Ventricular Rate:  98 PR Interval:  163 QRS Duration: 108 QT Interval:  409 QTC Calculation: 522 R Axis:   81 Text Interpretation:  Sinus rhythm Anteroseptal infarct, age indeterminate Prolonged QT interval Baseline wander in lead(s) V2 V6 No significant change since last tracing 07 Apr 2015 Confirmed by Izell Labat  MD-I, Ghali Morissette ([id number]) on 04/19/2015 11:10:50 PM       MDM   Final diagnoses:  Pain  Anemia, unspecified anemia type   Plan admission  Rolland Porter, MD, FACEP    I personally performed the services described in this documentation, which was scribed in my presence. The recorded information has been reviewed and considered.  Rolland Porter, MD, Barbette Or, MD 04/20/15 [phone]

## 2015-04-20 ENCOUNTER — Inpatient Hospital Stay (HOSPITAL_COMMUNITY): Payer: Federal, State, Local not specified - PPO

## 2015-04-20 ENCOUNTER — Emergency Department (HOSPITAL_COMMUNITY): Payer: Federal, State, Local not specified - PPO

## 2015-04-20 ENCOUNTER — Encounter (HOSPITAL_COMMUNITY): Payer: Self-pay | Admitting: Internal Medicine

## 2015-04-20 ENCOUNTER — Other Ambulatory Visit: Payer: Self-pay | Admitting: Internal Medicine

## 2015-04-20 DIAGNOSIS — I10 Essential (primary) hypertension: Secondary | ICD-10-CM | POA: Diagnosis not present

## 2015-04-20 DIAGNOSIS — R531 Weakness: Secondary | ICD-10-CM | POA: Diagnosis not present

## 2015-04-20 DIAGNOSIS — M6281 Muscle weakness (generalized): Secondary | ICD-10-CM | POA: Diagnosis not present

## 2015-04-20 DIAGNOSIS — Z515 Encounter for palliative care: Secondary | ICD-10-CM | POA: Diagnosis not present

## 2015-04-20 DIAGNOSIS — C3432 Malignant neoplasm of lower lobe, left bronchus or lung: Secondary | ICD-10-CM | POA: Diagnosis present

## 2015-04-20 DIAGNOSIS — I5032 Chronic diastolic (congestive) heart failure: Secondary | ICD-10-CM | POA: Diagnosis not present

## 2015-04-20 DIAGNOSIS — Z87891 Personal history of nicotine dependence: Secondary | ICD-10-CM | POA: Diagnosis not present

## 2015-04-20 DIAGNOSIS — J9610 Chronic respiratory failure, unspecified whether with hypoxia or hypercapnia: Secondary | ICD-10-CM | POA: Diagnosis present

## 2015-04-20 DIAGNOSIS — C7A1 Malignant poorly differentiated neuroendocrine tumors: Secondary | ICD-10-CM

## 2015-04-20 DIAGNOSIS — R627 Adult failure to thrive: Secondary | ICD-10-CM | POA: Diagnosis not present

## 2015-04-20 DIAGNOSIS — Z79891 Long term (current) use of opiate analgesic: Secondary | ICD-10-CM | POA: Diagnosis not present

## 2015-04-20 DIAGNOSIS — Z794 Long term (current) use of insulin: Secondary | ICD-10-CM | POA: Diagnosis not present

## 2015-04-20 DIAGNOSIS — G893 Neoplasm related pain (acute) (chronic): Secondary | ICD-10-CM | POA: Diagnosis present

## 2015-04-20 DIAGNOSIS — Z66 Do not resuscitate: Secondary | ICD-10-CM

## 2015-04-20 DIAGNOSIS — M549 Dorsalgia, unspecified: Secondary | ICD-10-CM | POA: Diagnosis not present

## 2015-04-20 DIAGNOSIS — E876 Hypokalemia: Secondary | ICD-10-CM | POA: Diagnosis not present

## 2015-04-20 DIAGNOSIS — C3492 Malignant neoplasm of unspecified part of left bronchus or lung: Secondary | ICD-10-CM

## 2015-04-20 DIAGNOSIS — D638 Anemia in other chronic diseases classified elsewhere: Secondary | ICD-10-CM | POA: Diagnosis present

## 2015-04-20 DIAGNOSIS — D6481 Anemia due to antineoplastic chemotherapy: Secondary | ICD-10-CM | POA: Diagnosis present

## 2015-04-20 DIAGNOSIS — K5909 Other constipation: Secondary | ICD-10-CM | POA: Diagnosis not present

## 2015-04-20 DIAGNOSIS — E119 Type 2 diabetes mellitus without complications: Secondary | ICD-10-CM | POA: Diagnosis not present

## 2015-04-20 DIAGNOSIS — R52 Pain, unspecified: Secondary | ICD-10-CM | POA: Diagnosis present

## 2015-04-20 DIAGNOSIS — E440 Moderate protein-calorie malnutrition: Secondary | ICD-10-CM | POA: Diagnosis present

## 2015-04-20 DIAGNOSIS — Z79899 Other long term (current) drug therapy: Secondary | ICD-10-CM | POA: Diagnosis not present

## 2015-04-20 DIAGNOSIS — J449 Chronic obstructive pulmonary disease, unspecified: Secondary | ICD-10-CM | POA: Diagnosis present

## 2015-04-20 DIAGNOSIS — G4700 Insomnia, unspecified: Secondary | ICD-10-CM | POA: Diagnosis present

## 2015-04-20 DIAGNOSIS — Z923 Personal history of irradiation: Secondary | ICD-10-CM | POA: Diagnosis not present

## 2015-04-20 DIAGNOSIS — C7B8 Other secondary neuroendocrine tumors: Secondary | ICD-10-CM | POA: Diagnosis not present

## 2015-04-20 DIAGNOSIS — T451X5A Adverse effect of antineoplastic and immunosuppressive drugs, initial encounter: Secondary | ICD-10-CM | POA: Diagnosis present

## 2015-04-20 DIAGNOSIS — Z6820 Body mass index (BMI) 20.0-20.9, adult: Secondary | ICD-10-CM | POA: Diagnosis not present

## 2015-04-20 DIAGNOSIS — Z8505 Personal history of malignant neoplasm of liver: Secondary | ICD-10-CM | POA: Diagnosis not present

## 2015-04-20 DIAGNOSIS — D649 Anemia, unspecified: Secondary | ICD-10-CM | POA: Diagnosis not present

## 2015-04-20 DIAGNOSIS — T40605A Adverse effect of unspecified narcotics, initial encounter: Secondary | ICD-10-CM | POA: Diagnosis not present

## 2015-04-20 DIAGNOSIS — C7951 Secondary malignant neoplasm of bone: Secondary | ICD-10-CM | POA: Diagnosis not present

## 2015-04-20 LAB — CBC WITH DIFFERENTIAL/PLATELET
BASOS ABS: 0 10*3/uL (ref 0.0–0.1)
Basophils Relative: 0 % (ref 0–1)
EOS ABS: 0.2 10*3/uL (ref 0.0–0.7)
Eosinophils Relative: 2 % (ref 0–5)
HCT: 23.3 % — ABNORMAL LOW (ref 39.0–52.0)
HEMOGLOBIN: 7.7 g/dL — AB (ref 13.0–17.0)
LYMPHS PCT: 9 % — AB (ref 12–46)
Lymphs Abs: 0.7 10*3/uL (ref 0.7–4.0)
MCH: 31.4 pg (ref 26.0–34.0)
MCHC: 33 g/dL (ref 30.0–36.0)
MCV: 95.1 fL (ref 78.0–100.0)
MONO ABS: 1.1 10*3/uL — AB (ref 0.1–1.0)
Monocytes Relative: 14 % — ABNORMAL HIGH (ref 3–12)
NEUTROS PCT: 75 % (ref 43–77)
Neutro Abs: 5.9 10*3/uL (ref 1.7–7.7)
PLATELETS: 114 10*3/uL — AB (ref 150–400)
RBC: 2.45 MIL/uL — ABNORMAL LOW (ref 4.22–5.81)
RDW: 18.6 % — ABNORMAL HIGH (ref 11.5–15.5)
WBC: 7.9 10*3/uL (ref 4.0–10.5)

## 2015-04-20 LAB — COMPREHENSIVE METABOLIC PANEL
ALK PHOS: 758 U/L — AB (ref 38–126)
ALT: 43 U/L (ref 17–63)
ANION GAP: 10 (ref 5–15)
AST: 137 U/L — ABNORMAL HIGH (ref 15–41)
Albumin: 2.5 g/dL — ABNORMAL LOW (ref 3.5–5.0)
BILIRUBIN TOTAL: 0.8 mg/dL (ref 0.3–1.2)
BUN: 18 mg/dL (ref 6–20)
CALCIUM: 10.4 mg/dL — AB (ref 8.9–10.3)
CO2: 27 mmol/L (ref 22–32)
CREATININE: 0.65 mg/dL (ref 0.61–1.24)
Chloride: 99 mmol/L — ABNORMAL LOW (ref 101–111)
Glucose, Bld: 160 mg/dL — ABNORMAL HIGH (ref 65–99)
Potassium: 3.5 mmol/L (ref 3.5–5.1)
Sodium: 136 mmol/L (ref 135–145)
TOTAL PROTEIN: 6.3 g/dL — AB (ref 6.5–8.1)

## 2015-04-20 LAB — GLUCOSE, CAPILLARY
GLUCOSE-CAPILLARY: 155 mg/dL — AB (ref 65–99)
GLUCOSE-CAPILLARY: 217 mg/dL — AB (ref 65–99)
Glucose-Capillary: 184 mg/dL — ABNORMAL HIGH (ref 65–99)
Glucose-Capillary: 191 mg/dL — ABNORMAL HIGH (ref 65–99)

## 2015-04-20 LAB — TYPE AND SCREEN
ABO/RH(D): O POS
ANTIBODY SCREEN: NEGATIVE

## 2015-04-20 LAB — URINALYSIS, ROUTINE W REFLEX MICROSCOPIC
GLUCOSE, UA: NEGATIVE mg/dL
HGB URINE DIPSTICK: NEGATIVE
KETONES UR: NEGATIVE mg/dL
LEUKOCYTES UA: NEGATIVE
Nitrite: NEGATIVE
PROTEIN: NEGATIVE mg/dL
Specific Gravity, Urine: 1.024 (ref 1.005–1.030)
UROBILINOGEN UA: 1 mg/dL (ref 0.0–1.0)
pH: 7 (ref 5.0–8.0)

## 2015-04-20 LAB — SAMPLE TO BLOOD BANK

## 2015-04-20 LAB — MAGNESIUM: Magnesium: 1.5 mg/dL — ABNORMAL LOW (ref 1.7–2.4)

## 2015-04-20 MED ORDER — ENSURE ENLIVE PO LIQD
237.0000 mL | Freq: Two times a day (BID) | ORAL | Status: DC
  Administered 2015-04-20 – 2015-04-27 (×9): 237 mL via ORAL

## 2015-04-20 MED ORDER — ACETAMINOPHEN 650 MG RE SUPP: 650.0000 mg | Freq: Four times a day (QID) | RECTAL | Status: DC | PRN

## 2015-04-20 MED ORDER — HYDROMORPHONE HCL 1 MG/ML IJ SOLN
1.0000 mg | INTRAMUSCULAR | Status: AC | PRN
  Administered 2015-04-20 (×3): 1 mg via INTRAVENOUS
  Filled 2015-04-20 (×3): qty 1

## 2015-04-20 MED ORDER — POLYETHYLENE GLYCOL 3350 17 G PO PACK
17.0000 g | PACK | Freq: Every day | ORAL | Status: DC | PRN
  Administered 2015-04-22 – 2015-04-27 (×2): 17 g via ORAL
  Filled 2015-04-20 (×2): qty 1

## 2015-04-20 MED ORDER — ENOXAPARIN SODIUM 40 MG/0.4ML ~~LOC~~ SOLN
40.0000 mg | SUBCUTANEOUS | Status: DC
  Administered 2015-04-20: 40 mg via SUBCUTANEOUS
  Filled 2015-04-20 (×2): qty 0.4

## 2015-04-20 MED ORDER — OXYCODONE HCL ER 40 MG PO T12A
70.0000 mg | EXTENDED_RELEASE_TABLET | Freq: Three times a day (TID) | ORAL | Status: DC
  Administered 2015-04-20 – 2015-04-22 (×6): 70 mg via ORAL
  Filled 2015-04-20: qty 2
  Filled 2015-04-20: qty 1
  Filled 2015-04-20: qty 2
  Filled 2015-04-20: qty 1
  Filled 2015-04-20 (×2): qty 2
  Filled 2015-04-20: qty 1
  Filled 2015-04-20: qty 2
  Filled 2015-04-20 (×3): qty 1
  Filled 2015-04-20: qty 2

## 2015-04-20 MED ORDER — BENZOCAINE 10 % MT GEL
1.0000 "application " | Freq: Three times a day (TID) | OROMUCOSAL | Status: DC | PRN
  Filled 2015-04-20: qty 9.4

## 2015-04-20 MED ORDER — ACETAMINOPHEN 325 MG PO TABS
650.0000 mg | ORAL_TABLET | Freq: Four times a day (QID) | ORAL | Status: DC | PRN
  Administered 2015-04-24: 650 mg via ORAL
  Filled 2015-04-20: qty 2

## 2015-04-20 MED ORDER — SODIUM CHLORIDE 0.9 % IV SOLN
INTRAVENOUS | Status: DC
  Administered 2015-04-20: 05:00:00 via INTRAVENOUS

## 2015-04-20 MED ORDER — SODIUM CHLORIDE 0.9 % IV BOLUS (SEPSIS)
1000.0000 mL | Freq: Once | INTRAVENOUS | Status: AC
  Administered 2015-04-20: 1000 mL via INTRAVENOUS

## 2015-04-20 MED ORDER — ONDANSETRON HCL 8 MG PO TABS: 8.0000 mg | ORAL_TABLET | Freq: Three times a day (TID) | ORAL | Status: DC | PRN

## 2015-04-20 MED ORDER — INSULIN ASPART 100 UNIT/ML ~~LOC~~ SOLN
0.0000 [IU] | Freq: Three times a day (TID) | SUBCUTANEOUS | Status: DC
  Administered 2015-04-20: 2 [IU] via SUBCUTANEOUS
  Administered 2015-04-20: 3 [IU] via SUBCUTANEOUS
  Administered 2015-04-21: 2 [IU] via SUBCUTANEOUS
  Administered 2015-04-21: 1 [IU] via SUBCUTANEOUS
  Administered 2015-04-21 – 2015-04-22 (×2): 2 [IU] via SUBCUTANEOUS
  Administered 2015-04-23 (×2): 3 [IU] via SUBCUTANEOUS
  Administered 2015-04-23 – 2015-04-24 (×2): 2 [IU] via SUBCUTANEOUS
  Administered 2015-04-24: 3 [IU] via SUBCUTANEOUS
  Administered 2015-04-24 – 2015-04-25 (×2): 1 [IU] via SUBCUTANEOUS
  Administered 2015-04-25: 7 [IU] via SUBCUTANEOUS
  Administered 2015-04-25: 3 [IU] via SUBCUTANEOUS
  Administered 2015-04-26 (×2): 1 [IU] via SUBCUTANEOUS

## 2015-04-20 MED ORDER — ONDANSETRON HCL 4 MG/2ML IJ SOLN: 4.0000 mg | Freq: Three times a day (TID) | INTRAMUSCULAR | Status: AC | PRN

## 2015-04-20 MED ORDER — SENNA 8.6 MG PO TABS: 1.0000 | ORAL_TABLET | Freq: Two times a day (BID) | ORAL | Status: DC

## 2015-04-20 MED ORDER — DEXAMETHASONE 4 MG PO TABS
4.0000 mg | ORAL_TABLET | Freq: Every day | ORAL | Status: DC
  Administered 2015-04-20 – 2015-04-27 (×8): 4 mg via ORAL
  Filled 2015-04-20 (×8): qty 1

## 2015-04-20 MED ORDER — ONDANSETRON HCL 4 MG PO TABS
4.0000 mg | ORAL_TABLET | Freq: Four times a day (QID) | ORAL | Status: DC | PRN
  Administered 2015-04-27: 4 mg via ORAL
  Filled 2015-04-20: qty 1

## 2015-04-20 MED ORDER — ONDANSETRON HCL 4 MG/2ML IJ SOLN
4.0000 mg | Freq: Four times a day (QID) | INTRAMUSCULAR | Status: DC | PRN
  Administered 2015-04-24: 4 mg via INTRAVENOUS
  Filled 2015-04-20: qty 2

## 2015-04-20 MED ORDER — MOMETASONE FURO-FORMOTEROL FUM 100-5 MCG/ACT IN AERO
2.0000 | INHALATION_SPRAY | Freq: Two times a day (BID) | RESPIRATORY_TRACT | Status: DC
  Administered 2015-04-20 – 2015-04-27 (×15): 2 via RESPIRATORY_TRACT
  Filled 2015-04-20: qty 8.8

## 2015-04-20 MED ORDER — SENNOSIDES-DOCUSATE SODIUM 8.6-50 MG PO TABS
1.0000 | ORAL_TABLET | Freq: Two times a day (BID) | ORAL | Status: DC
  Administered 2015-04-20 – 2015-04-21 (×3): 1 via ORAL
  Filled 2015-04-20 (×4): qty 1

## 2015-04-20 MED ORDER — HYDROMORPHONE HCL 1 MG/ML IJ SOLN
1.0000 mg | Freq: Once | INTRAMUSCULAR | Status: AC
  Administered 2015-04-20: 1 mg via INTRAVENOUS
  Filled 2015-04-20: qty 1

## 2015-04-20 MED ORDER — LORAZEPAM 1 MG PO TABS
1.0000 mg | ORAL_TABLET | Freq: Every day | ORAL | Status: DC
  Administered 2015-04-20 – 2015-04-25 (×6): 1 mg via ORAL
  Filled 2015-04-20 (×6): qty 1

## 2015-04-20 MED ORDER — ALUM & MAG HYDROXIDE-SIMETH 200-200-20 MG/5ML PO SUSP
30.0000 mL | Freq: Four times a day (QID) | ORAL | Status: DC | PRN
  Filled 2015-04-20: qty 30

## 2015-04-20 MED ORDER — ALBUTEROL SULFATE (2.5 MG/3ML) 0.083% IN NEBU
3.0000 mL | INHALATION_SOLUTION | Freq: Four times a day (QID) | RESPIRATORY_TRACT | Status: DC | PRN
  Administered 2015-04-24: 3 mL via RESPIRATORY_TRACT
  Filled 2015-04-20: qty 3

## 2015-04-20 MED ORDER — BISMUTH SUBSALICYLATE 262 MG/15ML PO SUSP
30.0000 mL | Freq: Four times a day (QID) | ORAL | Status: DC | PRN
  Filled 2015-04-20: qty 236

## 2015-04-20 MED ORDER — SODIUM CHLORIDE 0.9 % IV SOLN
INTRAVENOUS | Status: DC
  Administered 2015-04-20 – 2015-04-21 (×2): via INTRAVENOUS

## 2015-04-20 MED ORDER — OXYCODONE HCL ER 40 MG PO T12A
60.0000 mg | EXTENDED_RELEASE_TABLET | Freq: Three times a day (TID) | ORAL | Status: DC
  Administered 2015-04-20: 60 mg via ORAL
  Filled 2015-04-20 (×2): qty 1

## 2015-04-20 MED ORDER — VITAMIN C 500 MG PO TABS
500.0000 mg | ORAL_TABLET | Freq: Every day | ORAL | Status: DC
  Administered 2015-04-20 – 2015-04-26 (×7): 500 mg via ORAL
  Filled 2015-04-20 (×7): qty 1

## 2015-04-20 MED ORDER — PANTOPRAZOLE SODIUM 40 MG PO TBEC
40.0000 mg | DELAYED_RELEASE_TABLET | Freq: Every day | ORAL | Status: DC
  Administered 2015-04-20 – 2015-04-26 (×7): 40 mg via ORAL
  Filled 2015-04-20 (×7): qty 1

## 2015-04-20 MED ORDER — VITAMIN B-6 100 MG PO TABS
100.0000 mg | ORAL_TABLET | Freq: Every day | ORAL | Status: DC
  Administered 2015-04-20 – 2015-04-25 (×6): 100 mg via ORAL
  Filled 2015-04-20 (×7): qty 1

## 2015-04-20 MED ORDER — HYDROMORPHONE HCL 1 MG/ML IJ SOLN
1.0000 mg | INTRAMUSCULAR | Status: DC | PRN
  Administered 2015-04-20 – 2015-04-21 (×5): 1 mg via INTRAVENOUS
  Filled 2015-04-20 (×5): qty 1

## 2015-04-20 NOTE — Consult Note (Signed)
Consultation Note Date: 04/20/2015   Patient Name: Kerry Peck  DOB: [birth year]  MRN: [id number]  Age / Sex: 54 y.o., male   PCP: Curt Bears, MD Referring Physician: Annita Brod, MD  Reason for Consultation: Establishing goals of care, Non pain symptom management, Pain control and Psychosocial/spiritual support  Palliative Care Assessment and Plan Summary of Established Goals of Care and Medical Treatment Preferences    Palliative Care Discussion Held Today:    This NP Wadie Lessen reviewed medical records, received report from team, assessed the patient and then meet at the patient's bedside to discuss diagnosis, prognosis, GOC, EOL wishes disposition and options.  Need for pain management   Patient complains of "pain all over" ,  "left hip, shoulders and now my lungs are the worse".  Pain never gets below a 5/10 even with current medications.   OP  pain management; OxyContin 60 mg by mouth every 8 hours in addition to oxycodone 10 mg by mouth Q4-6 hours as needed for pain. Sometimes he "adds in an [place]"   Past h/o of ETOH abuse 8-10 beers a days and more on the week-ends. Stopped all alcohol use when diagnosed.  Pain management strategies discussed.    Concept of Hospice and Palliative Care were discussed.  Hospice benefit detailed specific to hospice facility when eligible   Questions and concerns addressed.   Patient encouraged to call with questions or concerns.  PMT will continue to support holistically.  We also discussed  advanced directives.  Concepts specific to code status, artifical feeding and hydration, continued IV antibiotics and rehospitalization was had.  The difference between a aggressive medical intervention path  and a palliative comfort care path for this patient at this time was had.  Values and goals of care important to patient and family were attempted to be elicited.     Primary Decision Maker: self, he has a sister [name] who is  his main support person   Goals of Care/Code Status/Advance Care Planning:   Code Status:  DNR/DNI Open to continued treatment to prolong quality of life, however he tells me "I know I don't have long to live"  Symptom Management:   Bowel regiment:  Pain: Increase Oxycodone ER 70 mg po every 8 hrs                  Dexamethasone 4 mg po daily                 Continue Dilaudid 1 mg IV every 4 hrs prn  Insomnia: Ativan 1 mg po qhs  Psycho-social/Spiritual:   Support System: limited, sister who lives with him   Prognosis: < 6 months  Discharge Planning: Pending       Chief Complaint: uncontrolled pain, weakness  History of Present Illness:  DIAGNOSIS: Extensive stage, Stage IV (T2a, N1, M1b) small cell lung cancer diagnosed in July of 2015.  PRIOR THERAPY:  1) Systemic chemotherapy with carboplatin for AUC of 5 on day 1 and etoposide at 120 mg/M2 on days 1, 2 and 3 with Neulasta support on day 4. Status post 6 cycles.  2) Prophylactic cranial irradiation under the care of Dr. Sondra Come. 3) Palliative radiotherapy to the T7-spine completed on 09/06/2014.  CURRENT THERAPY: Second line systemic chemotherapy with cisplatin 30 MG/M2 and irinotecan 65 MG/M2 on days 1 and 8 every 3 weeks, status post 4 cycles.  Continued physical and functional decline and uncontrolled pain.    Primary Diagnoses  Present  on Admission:  . Cancer related pain . Failure to thrive in adult . Anemia . Small cell lung cancer . Pain  Palliative Review of Systems:    -weakness, fatigue, pain, poor po intake and weight loss   I have reviewed the medical record, interviewed the patient and family, and examined the patient. The following aspects are pertinent.  Past Medical History  Diagnosis Date  . Hypertension   . Smoking addiction   . Radiation 08/16/14-09/06/14    prophylatic cranial radiation 25 gray, chest, t-spine  35 gray  . Radiation 09/29/14-10/12/14    left hilar area 30 gray,  right pelvis 30 gray, right lower rib cage area 30 gray  . Cancer     lung and liver  . 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 Lung cancer   . Liver cancer   . Diabetes mellitus without complication    Social History   Social History  . Marital Status: Single    Spouse Name: N/A  . Number of Children: N/A  . Years of Education: N/A   Social History Main Topics  . Smoking status: Former Smoker -- 0.75 packs/day for 30 years    Types: Cigarettes    Quit date: 06/03/2014  . Smokeless tobacco: Never Used  . Alcohol Use: No     Comment: former  . Drug Use: No  . Sexual Activity: Yes   Other Topics Concern  . None   Social History Narrative   Family History  Problem Relation Age of Onset  . Pancreatic cancer Mother    Scheduled Meds: . dexamethasone  4 mg Oral Daily  . enoxaparin (LOVENOX) injection  40 mg Subcutaneous Q24H  . feeding supplement (ENSURE ENLIVE)  237 mL Oral BID BM  . insulin aspart  0-9 Units Subcutaneous TID WC  . mometasone-formoterol  2 puff Inhalation BID  . OxyCODONE  70 mg Oral 3 times per day  . pantoprazole  40 mg Oral Daily  . pyridOXINE  100 mg Oral Daily  . senna  1 tablet Oral BID  . vitamin C  500 mg Oral Daily   Continuous Infusions: . sodium chloride 75 mL/hr at 04/20/15 0807   PRN Meds:.acetaminophen **OR** acetaminophen, albuterol, alum & mag hydroxide-simeth, benzocaine, bismuth subsalicylate, HYDROmorphone (DILAUDID) injection, ondansetron (ZOFRAN) IV, ondansetron **OR** ondansetron (ZOFRAN) IV, ondansetron, polyethylene glycol Medications Prior to Admission:  Prior to Admission medications   Medication Sig Start Date End Date Taking? Authorizing Provider  albuterol (PROVENTIL HFA;VENTOLIN HFA) 108 (90 BASE) MCG/ACT inhaler Inhale 2 puffs into the lungs every 6 (six) hours as needed for wheezing. 08/09/12  Yes Leandrew Koyanagi, MD  alum & mag hydroxide-simeth (MAALOX/MYLANTA) 200-200-20 MG/5ML suspension Take 30 mLs by mouth  every 6 (six) hours as needed for indigestion or heartburn (indigestion).   Yes Historical Provider, MD  benzocaine (ORAJEL) 10 % mucosal gel Use as directed 1 application in the mouth or throat 3 (three) times daily as needed for mouth pain (mouth pain).   Yes Historical Provider, MD  bismuth subsalicylate (PEPTO BISMOL) 262 MG/15ML suspension Take 30 mLs by mouth every 6 (six) hours as needed for indigestion (indigestion).   Yes Historical Provider, MD  emollient (BIAFINE) cream Apply topically as needed. Patient taking differently: Apply 1 application topically as needed. For radiation itch. 08/16/14  Yes Gery Pray, MD  Fluticasone-Salmeterol (ADVAIR) 250-50 MCG/DOSE AEPB Inhale 2 puffs into the lungs 2 (two) times daily as needed (wheezing).    Yes Historical Provider, MD  MAGNESIUM-OXIDE 400 (241.3 MG) MG tablet TAKE 3 TABLETS(1200 MG) BY MOUTH THREE TIMES DAILY. 04/11/15  Yes Curt Bears, MD  metFORMIN (GLUCOPHAGE) 1000 MG tablet Take 1,000 mg by mouth 2 (two) times daily with a meal.   Yes Historical Provider, MD  ondansetron (ZOFRAN) 8 MG tablet Take 1 tablet (8 mg total) by mouth every 8 (eight) hours as needed for nausea or vomiting (nause and vomiting). Disp. 20 tablets. 1 refill.  Called in to [facility] per Dr. Sondra Come on 11/24/14. 03/31/15  Yes Ladell Pier, MD  Oxycodone HCl 10 MG TABS Take 1 tablet (10 mg total) by mouth every 6 (six) hours as needed (for breakthrough pain). 04/13/15  Yes Curt Bears, MD  OxyCODONE HCl ER 60 MG T12A Take one tablet (60 mg) by mouth every 8 hours 04/08/15  Yes Costin Karlyne Greenspan, MD  pantoprazole (PROTONIX) 40 MG tablet Take 1 tablet (40 mg total) by mouth daily. 03/31/15  Yes Ladell Pier, MD  polyethylene glycol [facility] / GLYCOLAX) packet Take 17 g by mouth daily. Patient taking differently: Take 17 g by mouth daily as needed for mild constipation or moderate constipation.  11/29/14  Yes Albertine Patricia, MD  pyridOXINE (VITAMIN B-6) 100 MG  tablet Take 100 mg by mouth daily.   Yes Historical Provider, MD  senna (SENOKOT) 8.6 MG TABS tablet TAKE ONE TABLET BY MOUTH TWICE DAILY Patient taking differently: TAKE 8.6 MG BY MOUTH TWICE DAILY 03/24/15  Yes Adrena E Johnson, PA-C  vitamin C (ASCORBIC ACID) 500 MG tablet Take 500 mg by mouth daily.   Yes Historical Provider, MD  furosemide (LASIX) 20 MG tablet Take 2 tablets by mouth daily or as instructed Patient not taking: Reported on 04/07/2015 01/06/15   Carlton Adam, PA-C  Insulin Pen Needle (PEN NEEDLES 3/16") 31G X 5 MM MISC Use daily with your Lantus injection 11/29/14   Silver Huguenin Elgergawy, MD  levofloxacin (LEVAQUIN) 500 MG tablet Take 1 tablet (500 mg total) by mouth daily. Patient not taking: Reported on 04/19/2015 04/08/15   Caren Griffins, MD  ONE TOUCH ULTRA TEST test strip 1 each by Other route 3 (three) times daily.  04/11/14   Historical Provider, MD  potassium chloride SA (K-DUR,KLOR-CON) 20 MEQ tablet Take 1 tablet (20 mEq total) by mouth daily. Patient not taking: Reported on 04/07/2015 01/06/15   Carlton Adam, PA-C  sucralfate (CARAFATE) 1 GM/10ML suspension Take 10 mLs (1 g total) by mouth 4 (four) times daily -  with meals and at bedtime. Patient not taking: Reported on 04/07/2015 12/27/14   Thea Silversmith, MD   No Known Allergies CBC:    Component Value Date/Time   WBC 7.9 04/20/2015 0037   WBC 6.5 04/07/2015 0855   WBC 16.7* 03/27/2014 1200   HGB 7.7* 04/20/2015 0037   HGB 8.1* 04/07/2015 0855   HGB 12.8* 03/27/2014 1200   HCT 23.3* 04/20/2015 0037   HCT 24.4* 04/07/2015 0855   HCT 38.8* 03/27/2014 1200   PLT 114* 04/20/2015 0037   PLT 100* 04/07/2015 0855   MCV 95.1 04/20/2015 0037   MCV 98.0 04/07/2015 0855   MCV 96.4 03/27/2014 1200   NEUTROABS 5.9 04/20/2015 0037   NEUTROABS 5.1 04/07/2015 0855   LYMPHSABS 0.7 04/20/2015 0037   LYMPHSABS 0.5* 04/07/2015 0855   MONOABS 1.1* 04/20/2015 0037   MONOABS 0.8 04/07/2015 0855   EOSABS 0.2 04/20/2015  0037   EOSABS 0.1 04/07/2015 0855   BASOSABS 0.0 04/20/2015 0037  BASOSABS 0.0 04/07/2015 0855   Comprehensive Metabolic Panel:    Component Value Date/Time   NA 136 04/20/2015 0037   NA 139 04/07/2015 0855   K 3.5 04/20/2015 0037   K 4.1 04/07/2015 0855   CL 99* 04/20/2015 0037   CO2 27 04/20/2015 0037   CO2 31* 04/07/2015 0855   BUN 18 04/20/2015 0037   BUN 15.5 04/07/2015 0855   CREATININE 0.65 04/20/2015 0037   CREATININE 0.9 04/07/2015 0855   GLUCOSE 160* 04/20/2015 0037   GLUCOSE 132 04/07/2015 0855   CALCIUM 10.4* 04/20/2015 0037   CALCIUM 14.2* 04/07/2015 0855   AST 137* 04/20/2015 0037   AST 60* 04/07/2015 0855   ALT 43 04/20/2015 0037   ALT 21 04/07/2015 0855   ALKPHOS 758* 04/20/2015 0037   ALKPHOS 369* 04/07/2015 0855   BILITOT 0.8 04/20/2015 0037   BILITOT 0.83 04/07/2015 0855   PROT 6.3* 04/20/2015 0037   PROT 6.8 04/07/2015 0855   ALBUMIN 2.5* 04/20/2015 0037   ALBUMIN 2.8* 04/07/2015 0855    Physical Exam:  Vital Signs: BP 149/85 mmHg  Pulse 89  Temp(Src) 98.3 F (36.8 C) (Oral)  Resp 16  SpO2 100% SpO2: SpO2: 100 % O2 Device: O2 Device: Nasal Cannula O2 Flow Rate: O2 Flow Rate (L/min): 2 L/min Intake/output summary:  Intake/Output Summary (Last 24 hours) at 04/20/15 0853 Last data filed at 04/20/15 0620  Gross per 24 hour  Intake      0 ml  Output    300 ml  Net   -300 ml   LBM: Last BM Date:  (PTA) Baseline Weight:   Most recent weight:    Exam Findings:   General: chronically ill appearing, lying flat, moans on any repositioning HEENT: moist buccal membranes, no exudate CVS: RRR Resp: CTA Neuro: alert and oriented X3           Palliative Performance Scale: 30 %                Additional Data Reviewed: Recent Labs     04/20/15  0037  WBC  7.9  HGB  7.7*  PLT  114*  NA  136  BUN  18  CREATININE  0.65     Time In: 0900 Time Out: 1030 Time Total: 90 min  Greater than 50%  of this time was spent counseling and  coordinating care related to the above assessment and plan.  Discussed with  Dr  [place] Pink  Signed by: Wadie Lessen, NP  Knox Royalty, NP  04/20/2015, 8:53 AM  Please contact Palliative Medicine Team phone at [phone] for questions and concerns.   See AMION for contact information

## 2015-04-20 NOTE — Progress Notes (Signed)
DIAGNOSIS: Extensive stage, Stage IV (T2a, N1, M1b) small cell lung cancer diagnosed in July of 2015.  PRIOR THERAPY:  1) Systemic chemotherapy with carboplatin for AUC of 5 on day 1 and etoposide at 120 mg/M2 on days 1, 2 and 3 with Neulasta support on day 4. Status post 6 cycles.  2) Prophylactic cranial irradiation under the care of Dr. Sondra Come. 3) Palliative radiotherapy to the T7-spine completed on 09/06/2014.  CURRENT THERAPY: Second line systemic chemotherapy with cisplatin 30 MG/M2 and irinotecan 65 MG/M2 on days 1 and 8 every 3 weeks, status post 4 cycles.  Subjective: The patient is seen and examined today. He was feeling fine and eating his dinner. His pain is much better with the current pain medication. He was admitted yesterday with increasing fatigue and weakness as well as poor appetite and persistent back pain. Has been tolerating his systemic chemotherapy reasonably well over the last few weeks. His last CT scan of the chest showed no evidence for disease progression. He denied having any fever or chills. He has no nausea or vomiting.  Objective: Vital signs in last 24 hours: Temp:  [98 F (36.7 C)-98.3 F (36.8 C)] 98 F (36.7 C) (08/25 1419) Pulse Rate:  [74-107] 91 (08/25 1419) Resp:  [14-24] 14 (08/25 1419) BP: (126-171)/(80-110) 147/85 mmHg (08/25 1419) SpO2:  [95 %-100 %] 99 % (08/25 1419)  Intake/Output from previous day: 08/24 0701 - 08/25 0700 In: -  Out: 300 [Urine:300] Intake/Output this shift: Total I/O In: 450 [P.O.:450] Out: 700 [Urine:700]  General appearance: alert, cooperative, fatigued and no distress Resp: clear to auscultation bilaterally Cardio: regular rate and rhythm, S1, S2 normal, no murmur, click, rub or gallop GI: soft, non-tender; bowel sounds normal; no masses,  no organomegaly Extremities: extremities normal, atraumatic, no cyanosis or edema  Lab Results:   Recent Labs  04/20/15 0037  WBC 7.9  HGB 7.7*  HCT 23.3*  PLT 114*    BMET  Recent Labs  04/20/15 0037  NA 136  K 3.5  CL 99*  CO2 27  GLUCOSE 160*  BUN 18  CREATININE 0.65  CALCIUM 10.4*    Studies/Results: Dg Pelvis Portable  04/20/2015   CLINICAL DATA:  Posterior pelvic pain and tailbone pain this morning.  EXAM: PORTABLE PELVIS 1-2 VIEWS  COMPARISON:  CT of the abdomen and pelvis 03/01/2015  FINDINGS: Multiple lytic lesions are identified throughout the pelvis but partially obscured by bowel gas. Significant metastatic disease was previous identified in the iliac bones and sacrum on previous CT exam. Lytic lesions are also identified in the proximal femurs. No acute displaced fractures are identified. Consider further evaluation with CT for direct comparison and to determine presence of possible pathologic fractures given the presence of pain.  IMPRESSION: 1. Osseous metastases.  No obvious displaced fractures identified. 2. Consider CT for further evaluation.   Electronically Signed   By: Nolon Nations M.D.   On: 04/20/2015 09:18   Dg Chest Portable 1 View  04/20/2015   CLINICAL DATA:  Cough with yellow mucus production. Shortness of breath. History of metastatic lung cancer.  EXAM: PORTABLE CHEST - 1 VIEW  COMPARISON:  Most recent chest imaging radiographs and CT 04/07/2015  FINDINGS: Cardiomediastinal contours are unchanged with stable left hilar prominence. Scarring or atelectasis in the left midlung zone with underlying nodularity, unchanged. No consolidation to suggest pneumonia. The small left pleural effusion on prior CT is not seen radiographically. No pneumothorax. Callus formation about multiple right-sided rib fractures. Left  rib fractures that appear pathologic. Probable metastatic involvement of the left glenoid.  IMPRESSION: 1. No evidence of acute process or pneumonia. 2. Post treatment related change in the left hemithorax. Atelectasis/scarring and nodularity in the left midlung zone. There are bilateral rib fractures that appear  pathologic, callus formation about the right-sided fractures, characterized previously on CT.   Electronically Signed   By: Jeb Levering M.D.   On: 04/20/2015 01:22   Dg Abd Portable 1v  04/20/2015   CLINICAL DATA:  Abdominal pain and distention. History of metastatic lung carcinoma.  EXAM: PORTABLE ABDOMEN - 1 VIEW  COMPARISON:  CT of the abdomen and pelvis on 03/01/2015  FINDINGS: No evidence of bowel obstruction or significant ileus. No abnormal calcifications or soft tissue abnormalities identified. Visualized bony structures show loss of height of multiple vertebral bodies. Patient has known bony metastatic disease.  IMPRESSION: No acute findings. Loss of height of multiple vertebral bodies due to metastatic disease.   Electronically Signed   By: Aletta Edouard M.D.   On: 04/20/2015 09:25    Medications: I have reviewed the patient's current medications.  CODE STATUS: No CODE BLUE. Discussed with the patient today.  Assessment/Plan: 1) extensive stage small cell lung cancer: Currently undergoing second line systemic chemotherapy with reduced dose cisplatin and irinotecan status post 4 cycles. He has been tolerating his treatment well except for fatigue but has been feeling much better over the last few weeks except for the generalized weakness. His last imaging studies showed no evidence for disease progression. The patient is interested in continuing with his current treatment. I will reschedule his treatment to resume on 04/28/2015. 2) pain management: He is currently on OxyContin 70 mg by mouth every 8 hours in addition to Dilaudid for breakthrough pain. His feeling much better on this regimen. We will continue with the current treatment for now. 3) CODE STATUS: Discussed in details with the patient and decided on no CODE BLUE. Thank you for taking good care of Kerry Peck, I will continue to follow up the patient with you and assist in his management on as-needed basis..    LOS: 0 days     Giorgia Wahler K. 04/20/2015

## 2015-04-20 NOTE — Progress Notes (Signed)
   Follow Up Note  Pt admitted earlier this morning.  Seen after arrived to floor.  Patient is doing okay, feeling a little bit better although still having pain.  Exam: CV: Regular rate and rhythm, S1-S2 Lungs: Good inspiratory effort, mostly clear to auscultation Abd: Soft, nontender, nondistended, positive bowel sounds Ext: Emaciated, no clubbing or cyanosis or edema  Present on Admission:  . Cancer related pain secondary to : Appreciate palliative care help. Extended release oxycodone increased, dexamethasone added and continued with Dilaudid for breakthrough pain. Patient is pondering hospice options, although still receiving current chemotherapy  . Failure to thrive in adult . Anemia: Secondary to chronic disease plus chemotherapy. Recheck tomorrow.  . Small cell lung cancer  . DNR (do not resuscitate) . Moderate malnutrition: Seen by nutrition. Meets criteria in the context of chronic illness and started on ensure enlive twice a day

## 2015-04-20 NOTE — H&P (Signed)
[facility] History and Physical  [name] DVV:[id number] DOB: [birth year] DOA: 04/19/2015  Referring physician: Juliane Poot. Primary care physician : [name], MD  Specialists: Dr.Mohamed.  Chief Complaint: Pain and weakness.  HPI: Kerry Peck is a 54 y.o. male with history of extensive stage small cell lung cancer was recently admitted 2 weeks ago for shortness of breath multifactorial in cause and anemia and hypercalcemia presents to the ER because of increasing pain mostly in the abdomen area and pelvic area. Patient states he also has been feeling increasingly weak with poor appetite. He had vomited once yesterday in the ER. Denies any chest pain or shortness of breath. Denies any diarrhea. Since patient has significant pain patient has been admitted for further pain management. Patient has chronic shortness of breath which has been the patient has not improved. Patient is on chemotherapy and has not received one last week.  Review of Systems: As presented in the history of presenting illness, rest negative.  Past Medical History  Diagnosis Date  . Hypertension   . Smoking addiction   . Radiation 08/16/14-09/06/14    prophylatic cranial radiation 25 gray, chest, t-spine  35 gray  . Radiation 09/29/14-10/12/14    left hilar area 30 gray, right pelvis 30 gray, right lower rib cage area 30 gray  . Cancer     lung and liver  . 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 Lung cancer   . Liver cancer   . Diabetes mellitus without complication    Past Surgical History  Procedure Laterality Date  . Arm surgery     Social History:  reports that he quit smoking about 10 months ago. His smoking use included Cigarettes. He has a 22.5 pack-year smoking history. He has never used smokeless tobacco. He reports that he does not drink alcohol or use illicit drugs. Where does patient live home. Can patient participate in ADLs? Yes.  No Known Allergies  Family History:   Family History  Problem Relation Age of Onset  . Pancreatic cancer Mother       Prior to Admission medications   Medication Sig Start Date End Date Taking? Authorizing Provider  albuterol (PROVENTIL HFA;VENTOLIN HFA) 108 (90 BASE) MCG/ACT inhaler Inhale 2 puffs into the lungs every 6 (six) hours as needed for wheezing. 08/09/12  Yes Leandrew Koyanagi, MD  alum & mag hydroxide-simeth (MAALOX/MYLANTA) 200-200-20 MG/5ML suspension Take 30 mLs by mouth every 6 (six) hours as needed for indigestion or heartburn (indigestion).   Yes Historical Provider, MD  benzocaine (ORAJEL) 10 % mucosal gel Use as directed 1 application in the mouth or throat 3 (three) times daily as needed for mouth pain (mouth pain).   Yes Historical Provider, MD  bismuth subsalicylate (PEPTO BISMOL) 262 MG/15ML suspension Take 30 mLs by mouth every 6 (six) hours as needed for indigestion (indigestion).   Yes Historical Provider, MD  emollient (BIAFINE) cream Apply topically as needed. Patient taking differently: Apply 1 application topically as needed. For radiation itch. 08/16/14  Yes Gery Pray, MD  Fluticasone-Salmeterol (ADVAIR) 250-50 MCG/DOSE AEPB Inhale 2 puffs into the lungs 2 (two) times daily as needed (wheezing).    Yes Historical Provider, MD  MAGNESIUM-OXIDE 400 (241.3 MG) MG tablet TAKE 3 TABLETS(1200 MG) BY MOUTH THREE TIMES DAILY. 04/11/15  Yes Curt Bears, MD  metFORMIN (GLUCOPHAGE) 1000 MG tablet Take 1,000 mg by mouth 2 (two) times daily with a meal.   Yes Historical Provider, MD  ondansetron (ZOFRAN) 8 MG tablet  Take 1 tablet (8 mg total) by mouth every 8 (eight) hours as needed for nausea or vomiting (nause and vomiting). Disp. 20 tablets. 1 refill.  Called in to [facility] per Dr. Sondra Come on 11/24/14. 03/31/15  Yes Ladell Pier, MD  Oxycodone HCl 10 MG TABS Take 1 tablet (10 mg total) by mouth every 6 (six) hours as needed (for breakthrough pain). 04/13/15  Yes Curt Bears, MD  OxyCODONE HCl ER 60 MG  T12A Take one tablet (60 mg) by mouth every 8 hours 04/08/15  Yes Costin Karlyne Greenspan, MD  pantoprazole (PROTONIX) 40 MG tablet Take 1 tablet (40 mg total) by mouth daily. 03/31/15  Yes Ladell Pier, MD  polyethylene glycol [facility] / GLYCOLAX) packet Take 17 g by mouth daily. Patient taking differently: Take 17 g by mouth daily as needed for mild constipation or moderate constipation.  11/29/14  Yes Albertine Patricia, MD  pyridOXINE (VITAMIN B-6) 100 MG tablet Take 100 mg by mouth daily.   Yes Historical Provider, MD  senna (SENOKOT) 8.6 MG TABS tablet TAKE ONE TABLET BY MOUTH TWICE DAILY Patient taking differently: TAKE 8.6 MG BY MOUTH TWICE DAILY 03/24/15  Yes Adrena E Johnson, PA-C  vitamin C (ASCORBIC ACID) 500 MG tablet Take 500 mg by mouth daily.   Yes Historical Provider, MD  furosemide (LASIX) 20 MG tablet Take 2 tablets by mouth daily or as instructed Patient not taking: Reported on 04/07/2015 01/06/15   Carlton Adam, PA-C  Insulin Pen Needle (PEN NEEDLES 3/16") 31G X 5 MM MISC Use daily with your Lantus injection 11/29/14   Silver Huguenin Elgergawy, MD  levofloxacin (LEVAQUIN) 500 MG tablet Take 1 tablet (500 mg total) by mouth daily. Patient not taking: Reported on 04/19/2015 04/08/15   Caren Griffins, MD  ONE TOUCH ULTRA TEST test strip 1 each by Other route 3 (three) times daily.  04/11/14   Historical Provider, MD  potassium chloride SA (K-DUR,KLOR-CON) 20 MEQ tablet Take 1 tablet (20 mEq total) by mouth daily. Patient not taking: Reported on 04/07/2015 01/06/15   Carlton Adam, PA-C  sucralfate (CARAFATE) 1 GM/10ML suspension Take 10 mLs (1 g total) by mouth 4 (four) times daily -  with meals and at bedtime. Patient not taking: Reported on 04/07/2015 12/27/14   Thea Silversmith, MD    Physical Exam: Filed Vitals:   04/20/15 0145 04/20/15 0200 04/20/15 0258 04/20/15 0353  BP: 162/91 162/97 126/84 149/85  Pulse: 89 89 74 89  Temp:   98.1 F (36.7 C) 98.3 F (36.8 C)  TempSrc:   Oral Oral   Resp: '20 19 15 16  '$ SpO2: 99% 100% 96% 100%     General:  Moderately built and fully nourished.  Eyes: Anicteric. No pallor.  ENT: No discharge from the ears eyes nose and mouth.  Neck: No mass felt.  Cardiovascular: S1-S2 heard.  Respiratory: No rhonchi or crepitations.  Abdomen: Soft mild tenderness diffusely. No guarding or rigidity.  Skin: No rash.  Musculoskeletal: Pain on moving his left hip.  Psychiatric: Appears normal.  Neurologic: Alert awake oriented to time place and person. Moves all extremities.  Labs on Admission:  Basic Metabolic Panel:  Recent Labs Lab 04/20/15 0037  NA 136  K 3.5  CL 99*  CO2 27  GLUCOSE 160*  BUN 18  CREATININE 0.65  CALCIUM 10.4*   Liver Function Tests:  Recent Labs Lab 04/20/15 0037  AST 137*  ALT 43  ALKPHOS 758*  BILITOT 0.8  PROT 6.3*  ALBUMIN 2.5*   No results for input(s): LIPASE, AMYLASE in the last 168 hours. No results for input(s): AMMONIA in the last 168 hours. CBC:  Recent Labs Lab 04/20/15 0037  WBC 7.9  NEUTROABS 5.9  HGB 7.7*  HCT 23.3*  MCV 95.1  PLT 114*   Cardiac Enzymes: No results for input(s): CKTOTAL, CKMB, CKMBINDEX, TROPONINI in the last 168 hours.  BNP (last 3 results)  Recent Labs  04/07/15 1033  BNP 109.4*    ProBNP (last 3 results) No results for input(s): PROBNP in the last 8760 hours.  CBG: No results for input(s): GLUCAP in the last 168 hours.  Radiological Exams on Admission: Dg Chest Portable 1 View  04/20/2015   CLINICAL DATA:  Cough with yellow mucus production. Shortness of breath. History of metastatic lung cancer.  EXAM: PORTABLE CHEST - 1 VIEW  COMPARISON:  Most recent chest imaging radiographs and CT 04/07/2015  FINDINGS: Cardiomediastinal contours are unchanged with stable left hilar prominence. Scarring or atelectasis in the left midlung zone with underlying nodularity, unchanged. No consolidation to suggest pneumonia. The small left pleural effusion  on prior CT is not seen radiographically. No pneumothorax. Callus formation about multiple right-sided rib fractures. Left rib fractures that appear pathologic. Probable metastatic involvement of the left glenoid.  IMPRESSION: 1. No evidence of acute process or pneumonia. 2. Post treatment related change in the left hemithorax. Atelectasis/scarring and nodularity in the left midlung zone. There are bilateral rib fractures that appear pathologic, callus formation about the right-sided fractures, characterized previously on CT.   Electronically Signed   By: Jeb Levering M.D.   On: 04/20/2015 01:22     Assessment/Plan Principal Problem:   Cancer related pain Active Problems:   Small cell lung cancer   Weakness   Anemia   Failure to thrive in adult   Diabetes mellitus type 2, controlled   1. Cancer related pain with failure to thrive - at this time I have placed patient on Dilaudid IV every 4 when necessary along with his chronic long-acting oxycodone (pharmacy is going to dose it). I have consulted hospice for further goals and symptom management. May discuss outpatient oncologist in a.m. KUB and x-ray hip are pending. Continue with gentle hydration. 2. Chronic respiratory failure - probably multifactorial - COPD anemia radiation related. Continue inhalers for now. Repeat CBC has been ordered. Continue with oxygen supplementation. 3. Anemia - repeat CBC has been ordered. Denies any further decline may need transfusion. 4. Thrombocytopenia - probably chemotherapy related. Follow CBC. 5. Diabetes mellitus type 2 - while in the hospital patient has been placed on sliding scale coverage. 6. Extensive stage small cell lung cancer - per oncology.  I have reviewed patient's old charts and labs. I have consulted hospice.   DVT Prophylaxis Lovenox.  Code Status: DO NOT RESUSCITATE as per the request of patient.  Family Communication: Discussed with patient.  Disposition Plan: Admit to inpatient.     Neomia Herbel N. [facility] Pager [phone].  If 7PM-7AM, please contact night-coverage [email or web address] Password [email or web address] 04/20/2015, 7:01 AM

## 2015-04-20 NOTE — Progress Notes (Signed)
Initial Nutrition Assessment  DOCUMENTATION CODES:   Non-severe (moderate) malnutrition in context of chronic illness  INTERVENTION:   Recommend weight be taken to accurately assess. (Last weight from 8/13). Continue Ensure Enlive po BID, each supplement provides 350 kcal and 20 grams of protein Encourage PO intake RD to continue to monitor  NUTRITION DIAGNOSIS:   Malnutrition related to cancer and cancer related treatments, chronic illness as evidenced by percent weight loss, mild depletion of muscle mass.  GOAL:   Patient will meet greater than or equal to 90% of their needs  MONITOR:   PO intake, Supplement acceptance, Labs, Weight trends, Skin, I & O's  REASON FOR ASSESSMENT:   Malnutrition Screening Tool    ASSESSMENT:   54 y.o. male with history of extensive stage small cell lung cancer was recently admitted 2 weeks ago for shortness of breath multifactorial in cause and anemia and hypercalcemia presents to the ER because of increasing pain mostly in the abdomen area and pelvic area. Patient states he also has been feeling increasingly weak with poor appetite.  Pt reports poor appetite and not being able to keep food down x 1 week PTA. Pt states that he vomited this morning. Pt has ordered lunch tray. Pt drinks nutritional supplements at home.   Per weight history, pt has lost 57 lb since 1/19 (24% weight loss x 7 months, significant for time frame).  Nutrition-Focused physical exam completed. Findings are no fat depletion, mild-moderate muscle depletion, and no edema.   Labs reviewed: CBGs: 153-155 Low Mg  Diet Order:  Diet Carb Modified Fluid consistency:: Thin; Room service appropriate?: Yes  Skin:  Reviewed, no issues  Last BM:  PTA  Height:   Ht Readings from Last 1 Encounters:  04/07/15 '6\' 2"'$  (1.88 m)    Weight:   Wt Readings from Last 1 Encounters:  04/08/15 176 lb 6.4 oz (80.015 kg)    Ideal Body Weight:  86.4 kg  BMI:  There is no weight  on file to calculate BMI.  Estimated Nutritional Needs:   Kcal:  2400-2600  Protein:  120-130g  Fluid:  2.5L/day  EDUCATION NEEDS:   No education needs identified at this time  Clayton Bibles, MS, RD, LDN Pager: [phone] After Hours Pager: [phone]

## 2015-04-21 ENCOUNTER — Ambulatory Visit: Payer: Federal, State, Local not specified - PPO | Admitting: Physician Assistant

## 2015-04-21 ENCOUNTER — Other Ambulatory Visit: Payer: Federal, State, Local not specified - PPO

## 2015-04-21 ENCOUNTER — Telehealth: Payer: Self-pay | Admitting: Internal Medicine

## 2015-04-21 ENCOUNTER — Ambulatory Visit: Payer: Federal, State, Local not specified - PPO

## 2015-04-21 DIAGNOSIS — E440 Moderate protein-calorie malnutrition: Secondary | ICD-10-CM

## 2015-04-21 DIAGNOSIS — R531 Weakness: Secondary | ICD-10-CM

## 2015-04-21 DIAGNOSIS — I5032 Chronic diastolic (congestive) heart failure: Secondary | ICD-10-CM | POA: Diagnosis present

## 2015-04-21 DIAGNOSIS — I10 Essential (primary) hypertension: Secondary | ICD-10-CM | POA: Diagnosis present

## 2015-04-21 LAB — GLUCOSE, CAPILLARY
Glucose-Capillary: 124 mg/dL — ABNORMAL HIGH (ref 65–99)
Glucose-Capillary: 121 mg/dL — ABNORMAL HIGH (ref 65–99)

## 2015-04-21 LAB — CBC
HEMATOCRIT: 27.3 % — AB (ref 39.0–52.0)
HEMOGLOBIN: 8.8 g/dL — AB (ref 13.0–17.0)
MCH: 30.8 pg (ref 26.0–34.0)
MCHC: 32.2 g/dL (ref 30.0–36.0)
MCV: 95.5 fL (ref 78.0–100.0)
Platelets: 145 10*3/uL — ABNORMAL LOW (ref 150–400)
RBC: 2.86 MIL/uL — ABNORMAL LOW (ref 4.22–5.81)
RDW: 18.8 % — AB (ref 11.5–15.5)
WBC: 11 10*3/uL — AB (ref 4.0–10.5)

## 2015-04-21 LAB — BRAIN NATRIURETIC PEPTIDE: B Natriuretic Peptide: 119.5 pg/mL — ABNORMAL HIGH (ref 0.0–100.0)

## 2015-04-21 MED ORDER — OXYCODONE HCL 5 MG PO TABS
15.0000 mg | ORAL_TABLET | ORAL | Status: DC | PRN
  Administered 2015-04-21 – 2015-04-22 (×5): 15 mg via ORAL
  Filled 2015-04-21 (×5): qty 3

## 2015-04-21 MED ORDER — FUROSEMIDE 40 MG PO TABS
40.0000 mg | ORAL_TABLET | Freq: Every day | ORAL | Status: DC
  Administered 2015-04-21 – 2015-04-26 (×6): 40 mg via ORAL
  Filled 2015-04-21 (×6): qty 1

## 2015-04-21 MED ORDER — BISACODYL 10 MG RE SUPP
10.0000 mg | RECTAL | Status: AC
  Administered 2015-04-21: 10 mg via RECTAL
  Filled 2015-04-21: qty 1

## 2015-04-21 MED ORDER — FUROSEMIDE 10 MG/ML IJ SOLN
20.0000 mg | Freq: Once | INTRAMUSCULAR | Status: AC
  Administered 2015-04-21: 20 mg via INTRAVENOUS
  Filled 2015-04-21: qty 2

## 2015-04-21 MED ORDER — SENNOSIDES-DOCUSATE SODIUM 8.6-50 MG PO TABS
2.0000 | ORAL_TABLET | Freq: Two times a day (BID) | ORAL | Status: DC
  Administered 2015-04-21 – 2015-04-27 (×11): 2 via ORAL
  Filled 2015-04-21 (×16): qty 2

## 2015-04-21 NOTE — Progress Notes (Signed)
PROGRESS NOTE  Kerry BROSH VOJ:[id number] DOB: [birth year] DOA: 04/19/2015 PCP: [name], MD  HPI/Recap of past 25 hours: 54 year old male with past medical history of stage IV small cell lung cancer on chemotherapy admitted on 8/25 for uncontrolled pain. Palliative care has been consulted.  Patient doing better today. Medications adjusted and pain is somewhat better controlled although still he has episodes of breakthrough. IV changed over to by mouth starting this morning.  Assessment/Plan: Principal Problem:   Cancer related pain Active Problems:   Small cell lung cancer with secondary pain uncontrolled: Seem up out of care. Medications adjusted and now changed over to by mouth.     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 Better today.     Diabetes mellitus type 2, controlled: CBGs under 200    DNR (do not resuscitate)   Moderate malnutrition: Patient meets criteria in the context of chronic illness. Seen by nutrition. Continued on ensure enlive twice a day   Chronic diastolic heart failure: Noted blood pressure starting to trend up. Have restarted Lasix   Essential hypertension   Code Status: DO NOT RESUSCITATE  Family Communication: left message with sister  Disposition Plan: anticipated discharge tomorrow   Consultants:   palliative care  Oncology  Procedures:  None  Antibiotics:  None   Objective: BP 170/93 mmHg  Pulse 95  Temp(Src) 98.3 F (36.8 C) (Oral)  Resp 18  SpO2 98%  Intake/Output Summary (Last 24 hours) at 04/21/15 1431 Last data filed at 04/21/15 1300  Gross per 24 hour  Intake    615 ml  Output   2625 ml  Net  -2010 ml   There were no vitals filed for this visit.  Exam:   General:  alert and oriented 3, no acute distress  Cardiovascular: regular rate and rhythm, S1-S2  Respiratory: clear to auscultation bilaterally  Abdomen: soft, nontender, nondistended, positive bowel sounds  Musculoskeletal:  no clubbing or cyanosis or edema   Data Reviewed: Basic Metabolic Panel:  Recent Labs Lab 04/20/15 0037  NA 136  K 3.5  CL 99*  CO2 27  GLUCOSE 160*  BUN 18  CREATININE 0.65  CALCIUM 10.4*  MG 1.5*   Liver Function Tests:  Recent Labs Lab 04/20/15 0037  AST 137*  ALT 43  ALKPHOS 758*  BILITOT 0.8  PROT 6.3*  ALBUMIN 2.5*   No results for input(s): LIPASE, AMYLASE in the last 168 hours. No results for input(s): AMMONIA in the last 168 hours. CBC:  Recent Labs Lab 04/20/15 0037 04/21/15 1020  WBC 7.9 11.0*  NEUTROABS 5.9  --   HGB 7.7* 8.8*  HCT 23.3* 27.3*  MCV 95.1 95.5  PLT 114* 145*   Cardiac Enzymes:   No results for input(s): CKTOTAL, CKMB, CKMBINDEX, TROPONINI in the last 168 hours. BNP (last 3 results)  Recent Labs  04/07/15 1033 04/21/15 1020  BNP 109.4* 119.5*    ProBNP (last 3 results) No results for input(s): PROBNP in the last 8760 hours.  CBG:  Recent Labs Lab 04/20/15 0827 04/20/15 1150 04/20/15 1726 04/20/15 2218 04/21/15 0745  GLUCAP 155* 217* 184* 191* 124*    No results found for this or any previous visit (from the past 240 hour(s)).   Studies: No results found.  Scheduled Meds: . dexamethasone  4 mg Oral Daily  . feeding supplement (ENSURE ENLIVE)  237 mL Oral BID BM  . furosemide  40 mg Oral Daily  . insulin aspart  0-9 Units Subcutaneous TID  WC  . LORazepam  1 mg Oral QHS  . mometasone-formoterol  2 puff Inhalation BID  . OxyCODONE  70 mg Oral 3 times per day  . pantoprazole  40 mg Oral Daily  . pyridOXINE  100 mg Oral Daily  . senna-docusate  2 tablet Oral BID  . vitamin C  500 mg Oral Daily    Continuous Infusions:    Time spent: 15 minutes  [facility] Pager [phone]-7AM, please contact night-coverage at [email or web address], password [email or web address]8/26/2016, 2:31 PM  LOS: 1 day

## 2015-04-21 NOTE — Progress Notes (Signed)
Daily Progress Note   Patient Name: Kerry Peck       Date: 04/21/2015 DOB: 1961/03/06  Age: 54 y.o. MRN#: [id number] Attending Physician: Annita Brod, MD Primary Care Physician: [name], MD Admit Date: 04/19/2015  Reason for Consultation/Follow-up: Establishing goals of care, Non pain symptom management, Pain control and Psychosocial/spiritual support  Subjective:     - patient tells me he "is doing better" on slight changes made for symptom management  specific to pain.  Left hip continues to cause "pain when repositioned"  -we discussed importance of converting to oral agents in preparation for discharge so he can continue with his OP oncology care   Length of Stay: 1 day  Current Medications: Scheduled Meds:  . dexamethasone  4 mg Oral Daily  . feeding supplement (ENSURE ENLIVE)  237 mL Oral BID BM  . insulin aspart  0-9 Units Subcutaneous TID WC  . LORazepam  1 mg Oral QHS  . mometasone-formoterol  2 puff Inhalation BID  . OxyCODONE  70 mg Oral 3 times per day  . pantoprazole  40 mg Oral Daily  . pyridOXINE  100 mg Oral Daily  . senna-docusate  1 tablet Oral BID  . vitamin C  500 mg Oral Daily    Continuous Infusions:    PRN Meds: acetaminophen **OR** acetaminophen, albuterol, alum & mag hydroxide-simeth, benzocaine, bismuth subsalicylate, ondansetron **OR** ondansetron (ZOFRAN) IV, ondansetron, oxyCODONE, polyethylene glycol  Palliative Performance Scale: 30%     Vital Signs: BP 179/89 mmHg  Pulse 88  Temp(Src) 98.7 F (37.1 C) (Oral)  Resp 18  SpO2 100% SpO2: SpO2: 100 % O2 Device: O2 Device: Nasal Cannula O2 Flow Rate: O2 Flow Rate (L/min): 2 L/min  Intake/output summary:  Intake/Output Summary (Last 24 hours) at 04/21/15 1036 Last data filed at 04/21/15 1010  Gross per 24 hour  Intake    825 ml  Output   2725 ml  Net  -1900 ml   LBM: Last BM Date:  (prior to admission) Baseline Weight:   Most recent weight:    Physical  Exam:  General: chronically ill appearing, lying flat,  "having a better day" HEENT: moist buccal membranes, no exudate CVS: RRR Resp: CTA Neuro: alert and oriented X3  Additional Data Reviewed: Recent Labs     04/20/15  0037  04/21/15  1020  WBC  7.9  11.0*  HGB  7.7*  8.8*  PLT  114*  145*  NA  136   --   BUN  18   --   CREATININE  0.65   --      Problem List:  Patient Active Problem List   Diagnosis Date Noted  . Chronic diastolic heart failure 54/65/6812  . Essential hypertension 04/21/2015  . Cancer related pain 04/20/2015  . Failure to thrive in adult 04/20/2015  . Diabetes mellitus type 2, controlled 04/20/2015  . Pain 04/20/2015  . Pain, cancer 04/20/2015  . Moderate malnutrition 04/20/2015  . DNR (do not resuscitate)   . Hypercalcemia 04/07/2015  . Respiratory failure 04/07/2015  . Thrombocytopenia 04/07/2015  . Respiratory failure with hypoxia 04/07/2015  . Anemia 04/07/2015  . Encounter for antineoplastic chemotherapy 03/02/2015  . Pancytopenia due to antineoplastic chemotherapy 01/17/2015  . Hyperphosphatemia 12/30/2014  . Transaminitis 12/30/2014  . Hypomagnesemia 12/30/2014  . Nausea with vomiting 12/17/2014  . Dehydration 12/17/2014  . Weakness 12/17/2014  . Hypoalbuminemia 12/17/2014  . Antineoplastic chemotherapy induced pancytopenia 12/17/2014  . Protein-calorie malnutrition, severe 11/28/2014  .  Uncontrolled pain 11/26/2014  . Palliative care encounter   . CN (constipation)   . Loss of appetite   . Insomnia   . Hypercalcemia of malignancy 11/23/2014  . Cancer associated pain 11/23/2014  . 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 Bronchitis 08/23/2014  . Mucositis due to antineoplastic therapy 05/10/2014  . Peripheral edema 05/10/2014  . Small cell lung cancer 03/03/2014  . Mass of lower lobe of left lung 02/01/2014  . Hypertension 08/09/2012  . Hyperlipidemia 08/09/2012  . Diabetes mellitus 08/09/2012  . Overweight(278.02)  08/09/2012  . Nicotine addiction 08/09/2012  . Reactive airway disease 08/09/2012     Palliative Care Assessment & Plan    Code Status:  DNR  Goals of Care: Open to continued treatments to prolong quality of life, plans to meet with oncology next week as OP   3. Symptom Management:  Bowel regiment: Dulcolax supp NOW, Senna-s two tablets bid  Pain: Increase Oxycodone ER 70 mg po every 8 hrs  Dexamethasone 4 mg po daily  D/C Dilaudid  IV                   Oxycodone IR 15 mg po every 4 hrs prn  Insomnia: Ativan 1 mg po qhs    Prognosis:  Likely less than 6 months   5. Discharge Planning: Home with [place] was discussed with Dr [place] Pink  Thank you for allowing the Palliative Medicine Team to assist in the care of this patient.   Time In:  0900 Time Out: 0935 Total Time 35 min Prolonged Time Billed  no     Greater than 50%  of this time was spent counseling and coordinating care related to the above assessment and plan.     Knox Royalty, NP  04/21/2015, 10:36 AM  Please contact Palliative Medicine Team phone at [phone] for questions and concerns.

## 2015-04-21 NOTE — Telephone Encounter (Signed)
lvm for pt regarding to Sept appt.... °

## 2015-04-22 LAB — GLUCOSE, CAPILLARY
GLUCOSE-CAPILLARY: 175 mg/dL — AB (ref 65–99)
GLUCOSE-CAPILLARY: 102 mg/dL — AB (ref 65–99)
GLUCOSE-CAPILLARY: 101 mg/dL — AB (ref 65–99)
GLUCOSE-CAPILLARY: 186 mg/dL — AB (ref 65–99)
Glucose-Capillary: 159 mg/dL — ABNORMAL HIGH (ref 65–99)
Glucose-Capillary: 166 mg/dL — ABNORMAL HIGH (ref 65–99)

## 2015-04-22 LAB — BASIC METABOLIC PANEL
ANION GAP: 10 (ref 5–15)
BUN: 12 mg/dL (ref 6–20)
CALCIUM: 9.6 mg/dL (ref 8.9–10.3)
CO2: 26 mmol/L (ref 22–32)
Chloride: 98 mmol/L — ABNORMAL LOW (ref 101–111)
Creatinine, Ser: 0.45 mg/dL — ABNORMAL LOW (ref 0.61–1.24)
GFR calc non Af Amer: >60 mL/min (ref >60)
Glucose, Bld: 107 mg/dL — ABNORMAL HIGH (ref 65–99)
Potassium: 3.3 mmol/L — ABNORMAL LOW (ref 3.5–5.1)
SODIUM: 134 mmol/L — AB (ref 135–145)

## 2015-04-22 MED ORDER — BISACODYL 10 MG RE SUPP
10.0000 mg | Freq: Two times a day (BID) | RECTAL | Status: DC | PRN
  Administered 2015-04-22 – 2015-04-26 (×2): 10 mg via RECTAL
  Filled 2015-04-22 (×2): qty 1

## 2015-04-22 MED ORDER — OXYCODONE HCL 5 MG PO TABS
15.0000 mg | ORAL_TABLET | ORAL | Status: DC | PRN
  Administered 2015-04-22 – 2015-04-26 (×16): 15 mg via ORAL
  Filled 2015-04-22 (×17): qty 3

## 2015-04-22 MED ORDER — OXYCODONE HCL ER 40 MG PO T12A
80.0000 mg | EXTENDED_RELEASE_TABLET | Freq: Three times a day (TID) | ORAL | Status: DC
  Administered 2015-04-22 – 2015-04-26 (×13): 80 mg via ORAL
  Filled 2015-04-22 (×13): qty 2

## 2015-04-22 MED ORDER — HYDROMORPHONE HCL 1 MG/ML IJ SOLN
1.0000 mg | INTRAMUSCULAR | Status: AC
  Administered 2015-04-22: 1 mg via INTRAVENOUS
  Filled 2015-04-22: qty 1

## 2015-04-22 MED ORDER — FUROSEMIDE 10 MG/ML IJ SOLN
20.0000 mg | Freq: Once | INTRAMUSCULAR | Status: AC
  Administered 2015-04-22: 20 mg via INTRAVENOUS
  Filled 2015-04-22: qty 2

## 2015-04-22 NOTE — Progress Notes (Signed)
PROGRESS NOTE  Kerry Peck IEP:[id number] DOB: 09/16/1960 DOA: 04/19/2015 PCP: [name], MD  HPI/Recap of past 35 hours: 54 year old male with past medical history of stage IV small cell lung cancer on chemotherapy admitted on 8/25 for uncontrolled pain. Palliative care has been consulted.  Patient doing better today. Medications adjusted and pain initially better. Patient changed over to by mouth medications only on 8/26.  This morning, patient in much more severe pain. He states he woke up this way. Worse pain is on his left side starting below left axilla going down to his leg. Hard for him to take a deep breath. He also feels very anxious.  Assessment/Plan:  Active Problems:   Small cell lung cancer with secondary pain uncontrolled: Worse today. Have increased both the frequency of his instant release plus the dose of his extended release. Hoping that this should give him some improvement.    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 Better today.     Diabetes mellitus type 2, controlled: CBGs under 200    DNR (do not resuscitate)   Moderate malnutrition: Patient meets criteria in the context of chronic illness. Seen by nutrition. Continued on ensure enlive twice a day   Chronic diastolic heart failure: Noted blood pressure starting to trend up. Have restarted Lasix.  Give extra dose this morning. Down 3.7 L   Essential hypertension   Code Status: DO NOT RESUSCITATE  Family Communication: left message with sister  Disposition Plan: anticipated discharge possibly tomorrow   Consultants:   palliative care  Oncology  Procedures:  None  Antibiotics:  None   Objective: BP 156/97 mmHg  Pulse 95  Temp(Src) 98.6 F (37 C) (Oral)  Resp 16  Wt 75.1 kg (165 lb 9.1 oz)  SpO2 95%  Intake/Output Summary (Last 24 hours) at 04/22/15 1255 Last data filed at 04/22/15 1055  Gross per 24 hour  Intake    480 ml  Output   1950 ml  Net  -1470 ml     Filed Weights   04/22/15 0440  Weight: 75.1 kg (165 lb 9.1 oz)    Exam:   General:  alert and oriented 3, moderate distress secondary to pain  Cardiovascular: regular rate and rhythm, S1-S2  Respiratory: clear to auscultation bilaterally, decreased inspiratory effort because of pain  Abdomen: soft, nontender, nondistended, positive bowel sounds  Musculoskeletal: no clubbing or cyanosis or edema   Data Reviewed: Basic Metabolic Panel:  Recent Labs Lab 04/20/15 0037 04/22/15 0910  NA 136 134*  K 3.5 3.3*  CL 99* 98*  CO2 27 26  GLUCOSE 160* 107*  BUN 18 12  CREATININE 0.65 0.45*  CALCIUM 10.4* 9.6  MG 1.5*  --    Liver Function Tests:  Recent Labs Lab 04/20/15 0037  AST 137*  ALT 43  ALKPHOS 758*  BILITOT 0.8  PROT 6.3*  ALBUMIN 2.5*   No results for input(s): LIPASE, AMYLASE in the last 168 hours. No results for input(s): AMMONIA in the last 168 hours. CBC:  Recent Labs Lab 04/20/15 0037 04/21/15 1020  WBC 7.9 11.0*  NEUTROABS 5.9  --   HGB 7.7* 8.8*  HCT 23.3* 27.3*  MCV 95.1 95.5  PLT 114* 145*   Cardiac Enzymes:   No results for input(s): CKTOTAL, CKMB, CKMBINDEX, TROPONINI in the last 168 hours. BNP (last 3 results)  Recent Labs  04/07/15 1033 04/21/15 1020  BNP 109.4* 119.5*    ProBNP (last 3 results) No results for input(s): PROBNP  in the last 8760 hours.  CBG:  Recent Labs Lab 04/21/15 1158 04/21/15 1714 04/21/15 2301 04/22/15 0931 04/22/15 1136  GLUCAP 175* 159* 121* 102* 101*    No results found for this or any previous visit (from the past 240 hour(s)).   Studies: No results found.  Scheduled Meds: . dexamethasone  4 mg Oral Daily  . feeding supplement (ENSURE ENLIVE)  237 mL Oral BID BM  . furosemide  40 mg Oral Daily  . insulin aspart  0-9 Units Subcutaneous TID WC  . LORazepam  1 mg Oral QHS  . mometasone-formoterol  2 puff Inhalation BID  . OxyCODONE  80 mg Oral 3 times per day  . pantoprazole  40  mg Oral Daily  . pyridOXINE  100 mg Oral Daily  . senna-docusate  2 tablet Oral BID  . vitamin C  500 mg Oral Daily    Continuous Infusions:    Time spent: 25 minutes  [facility] Pager [phone], please contact night-coverage at [email or web address], password T[facility]8/27/2016, 12:55 PM  LOS: 2 days

## 2015-04-23 ENCOUNTER — Inpatient Hospital Stay (HOSPITAL_COMMUNITY): Payer: Federal, State, Local not specified - PPO

## 2015-04-23 LAB — GLUCOSE, CAPILLARY
GLUCOSE-CAPILLARY: 185 mg/dL — AB (ref 65–99)
Glucose-Capillary: 153 mg/dL — ABNORMAL HIGH (ref 65–99)
Glucose-Capillary: 246 mg/dL — ABNORMAL HIGH (ref 65–99)
Glucose-Capillary: 202 mg/dL — ABNORMAL HIGH (ref 65–99)

## 2015-04-23 MED ORDER — IOHEXOL 300 MG/ML  SOLN
100.0000 mL | Freq: Once | INTRAMUSCULAR | Status: AC | PRN
  Administered 2015-04-23: 100 mL via INTRAVENOUS

## 2015-04-23 MED ORDER — LORAZEPAM 0.5 MG PO TABS
0.5000 mg | ORAL_TABLET | Freq: Two times a day (BID) | ORAL | Status: DC
  Administered 2015-04-23: 0.5 mg via ORAL
  Filled 2015-04-23 (×2): qty 1

## 2015-04-23 MED ORDER — HYDROMORPHONE HCL 1 MG/ML IJ SOLN
0.5000 mg | INTRAMUSCULAR | Status: DC | PRN
  Administered 2015-04-23 – 2015-04-24 (×5): 0.5 mg via INTRAVENOUS
  Filled 2015-04-23 (×5): qty 1

## 2015-04-23 MED ORDER — IOHEXOL 300 MG/ML  SOLN: 100.0000 mL | Freq: Once | INTRAMUSCULAR | Status: DC | PRN

## 2015-04-23 MED ORDER — DIPHENHYDRAMINE HCL 25 MG PO CAPS
25.0000 mg | ORAL_CAPSULE | Freq: Four times a day (QID) | ORAL | Status: DC | PRN
  Administered 2015-04-23: 25 mg via ORAL
  Filled 2015-04-23: qty 1

## 2015-04-23 MED ORDER — DIPHENHYDRAMINE HCL 50 MG/ML IJ SOLN: 25.0000 mg | Freq: Four times a day (QID) | INTRAMUSCULAR | Status: DC | PRN

## 2015-04-23 MED ORDER — KETOROLAC TROMETHAMINE 30 MG/ML IJ SOLN
30.0000 mg | Freq: Once | INTRAMUSCULAR | Status: AC
  Administered 2015-04-23: 30 mg via INTRAVENOUS
  Filled 2015-04-23: qty 1

## 2015-04-23 NOTE — Progress Notes (Signed)
PROGRESS NOTE  Kerry Peck EVO:[id number] DOB: 1960/09/08 DOA: 04/19/2015 PCP: [name], MD  HPI/Recap of past 42 hours: 54 year old male with past medical history of stage IV small cell lung cancer on chemotherapy admitted on 8/25 for uncontrolled pain. Palliative care has been consulted.  Patient doing better today. Medications adjusted and pain initially better. Patient changed over to by mouth medications only on 8/26.  On 8/27 up in more severe pain beginning to increases in both oral medications. Initially this helped, but then patient woke up in more severe pain on same aspect of the left leg and side. Very anxious.   Assessment/Plan:  Active Problems:   Small cell lung cancer with secondary pain uncontrolled: Worse today. Have increased both the frequency of his instant release plus the dose of his extended release. Hoping that this should give him some improvement. Reviewed x-rays and lumbar signs of fracture, we'll check CT to rule out occult malignant fracture area have added, some scheduled low-dose benzodiazepine's plus a one-time dose of IV Toradol.    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 Better today.     Diabetes mellitus type 2, controlled: CBGs under 200    DNR (do not resuscitate)   Moderate malnutrition: Patient meets criteria in the context of chronic illness. Seen by nutrition. Continued on ensure enlive twice a day   Chronic diastolic heart failure: Noted blood pressure starting to trend up. Have restarted Lasix.  Give extra dose this morning. Down 3.7 L   Essential hypertension: Blood pressure, likely secondary to pain   Code Status: DO NOT RESUSCITATE  Family Communication: left message with sister  Disposition Plan: anticipated discharge once pain controlled   Consultants:   palliative care  Oncology  Procedures:  None  Antibiotics:  None   Objective: BP 156/92 mmHg  Pulse 97  Temp(Src) 97.9 F (36.6 C)  (Oral)  Resp 20  Wt 70.988 kg (156 lb 8 oz)  SpO2 99%  Intake/Output Summary (Last 24 hours) at 04/23/15 1312 Last data filed at 04/22/15 2328  Gross per 24 hour  Intake    360 ml  Output    350 ml  Net     10 ml   Filed Weights   04/22/15 0440 04/22/15 1700  Weight: 75.1 kg (165 lb 9.1 oz) 70.988 kg (156 lb 8 oz)    Exam:   General:  alert and oriented 3, significant distress secondary to pain  Cardiovascular: regular rate and rhythm, S1-S2, borderline tachycardia  Respiratory: clear to auscultation bilaterally, decreased inspiratory effort because of pain  Abdomen: soft, nontender, nondistended, positive bowel sounds  Musculoskeletal: no clubbing or cyanosis or edema , flexible functional exam deferred secondary to pain  Data Reviewed: Basic Metabolic Panel:  Recent Labs Lab 04/20/15 0037 04/22/15 0910  NA 136 134*  K 3.5 3.3*  CL 99* 98*  CO2 27 26  GLUCOSE 160* 107*  BUN 18 12  CREATININE 0.65 0.45*  CALCIUM 10.4* 9.6  MG 1.5*  --    Liver Function Tests:  Recent Labs Lab 04/20/15 0037  AST 137*  ALT 43  ALKPHOS 758*  BILITOT 0.8  PROT 6.3*  ALBUMIN 2.5*   No results for input(s): LIPASE, AMYLASE in the last 168 hours. No results for input(s): AMMONIA in the last 168 hours. CBC:  Recent Labs Lab 04/20/15 0037 04/21/15 1020  WBC 7.9 11.0*  NEUTROABS 5.9  --   HGB 7.7* 8.8*  HCT 23.3* 27.3*  MCV 95.1 95.5  PLT 114* 145*   Cardiac Enzymes:   No results for input(s): CKTOTAL, CKMB, CKMBINDEX, TROPONINI in the last 168 hours. BNP (last 3 results)  Recent Labs  04/07/15 1033 04/21/15 1020  BNP 109.4* 119.5*    ProBNP (last 3 results) No results for input(s): PROBNP in the last 8760 hours.  CBG:  Recent Labs Lab 04/22/15 1136 04/22/15 1651 04/22/15 2132 04/23/15 0740 04/23/15 1153  GLUCAP 101* 186* 166* 153* 246*    No results found for this or any previous visit (from the past 240 hour(s)).   Studies: No results  found.  Scheduled Meds: . dexamethasone  4 mg Oral Daily  . feeding supplement (ENSURE ENLIVE)  237 mL Oral BID BM  . furosemide  40 mg Oral Daily  . insulin aspart  0-9 Units Subcutaneous TID WC  . LORazepam  0.5 mg Oral BID  . LORazepam  1 mg Oral QHS  . mometasone-formoterol  2 puff Inhalation BID  . OxyCODONE  80 mg Oral 3 times per day  . pantoprazole  40 mg Oral Daily  . pyridOXINE  100 mg Oral Daily  . senna-docusate  2 tablet Oral BID  . vitamin C  500 mg Oral Daily    Continuous Infusions:    Time spent: 25 minutes  [facility] Pager [phone], please contact night-coverage at [email or web address], password [email or web address]8/28/2016, 1:12 PM  LOS: 3 days

## 2015-04-24 ENCOUNTER — Other Ambulatory Visit (HOSPITAL_COMMUNITY): Payer: Federal, State, Local not specified - PPO

## 2015-04-24 DIAGNOSIS — C7951 Secondary malignant neoplasm of bone: Secondary | ICD-10-CM | POA: Insufficient documentation

## 2015-04-24 DIAGNOSIS — D649 Anemia, unspecified: Secondary | ICD-10-CM | POA: Insufficient documentation

## 2015-04-24 DIAGNOSIS — Z515 Encounter for palliative care: Secondary | ICD-10-CM | POA: Insufficient documentation

## 2015-04-24 LAB — GLUCOSE, CAPILLARY
GLUCOSE-CAPILLARY: 159 mg/dL — AB (ref 65–99)
Glucose-Capillary: 129 mg/dL — ABNORMAL HIGH (ref 65–99)
Glucose-Capillary: 217 mg/dL — ABNORMAL HIGH (ref 65–99)

## 2015-04-24 MED ORDER — ALBUTEROL SULFATE (2.5 MG/3ML) 0.083% IN NEBU
2.5000 mg | INHALATION_SOLUTION | Freq: Four times a day (QID) | RESPIRATORY_TRACT | Status: DC
  Administered 2015-04-24 – 2015-04-27 (×9): 2.5 mg via RESPIRATORY_TRACT
  Filled 2015-04-24 (×10): qty 3

## 2015-04-24 MED ORDER — HYDROMORPHONE HCL 1 MG/ML IJ SOLN
0.2500 mg | INTRAMUSCULAR | Status: DC | PRN
  Administered 2015-04-24 – 2015-04-26 (×5): 0.25 mg via INTRAVENOUS
  Filled 2015-04-24 (×7): qty 1

## 2015-04-24 MED ORDER — INSULIN ASPART 100 UNIT/ML ~~LOC~~ SOLN
4.0000 [IU] | Freq: Once | SUBCUTANEOUS | Status: AC
  Administered 2015-04-24: 4 [IU] via SUBCUTANEOUS

## 2015-04-24 MED ORDER — LORAZEPAM 0.5 MG PO TABS
0.2500 mg | ORAL_TABLET | Freq: Two times a day (BID) | ORAL | Status: DC
  Administered 2015-04-24 – 2015-04-26 (×4): 0.25 mg via ORAL
  Filled 2015-04-24 (×4): qty 1

## 2015-04-24 MED ORDER — MUSCLE RUB 10-15 % EX CREA
TOPICAL_CREAM | CUTANEOUS | Status: DC | PRN
  Filled 2015-04-24: qty 85

## 2015-04-24 MED ORDER — ZOLPIDEM TARTRATE 5 MG PO TABS
5.0000 mg | ORAL_TABLET | Freq: Every evening | ORAL | Status: DC | PRN
  Administered 2015-04-24 – 2015-04-25 (×2): 5 mg via ORAL
  Filled 2015-04-24 (×2): qty 1

## 2015-04-24 NOTE — Progress Notes (Signed)
Daily Progress Note   Patient Name: Kerry Peck       Date: 04/24/2015 DOB: 1961/03/18  Age: 54 y.o. MRN#: [id number] Attending Physician: Annita Brod, MD Primary Care Physician: [name], MD Admit Date: 04/19/2015  Reason for Consultation/Follow-up: Establishing goals of care  Subjective:  patient is confused at times, sitting up in bed.  Interval Events:  CT scan with more bony progression since last scan Patient with pain crisis over the weekend, medication regimen adjusted: patient states he is much more comfortable today.  Discussed with patient about his recent CT scan, patient wishes to discuss with his sister himself. He is a little groggy today, not sure how much he understood completely. Will have to re assess in am.  Continue current pain management regimen and monitor.   Length of Stay: 4 days  Current Medications: Scheduled Meds:  . albuterol  2.5 mg Nebulization Q6H WA  . dexamethasone  4 mg Oral Daily  . feeding supplement (ENSURE ENLIVE)  237 mL Oral BID BM  . furosemide  40 mg Oral Daily  . insulin aspart  0-9 Units Subcutaneous TID WC  . LORazepam  0.5 mg Oral BID  . LORazepam  1 mg Oral QHS  . mometasone-formoterol  2 puff Inhalation BID  . OxyCODONE  80 mg Oral 3 times per day  . pantoprazole  40 mg Oral Daily  . pyridOXINE  100 mg Oral Daily  . senna-docusate  2 tablet Oral BID  . vitamin C  500 mg Oral Daily    Continuous Infusions:    PRN Meds: acetaminophen **OR** acetaminophen, albuterol, alum & mag hydroxide-simeth, benzocaine, bisacodyl, bismuth subsalicylate, diphenhydrAMINE **OR** diphenhydrAMINE, HYDROmorphone (DILAUDID) injection, iohexol, ondansetron **OR** ondansetron (ZOFRAN) IV, ondansetron, oxyCODONE, polyethylene glycol  Palliative Performance Scale: 40%     Vital Signs: BP 147/87 mmHg  Pulse 86  Temp(Src) 98 F (36.7 C) (Oral)  Resp 16  Wt 73.3 kg (161 lb 9.6 oz)  SpO2 92% SpO2: SpO2: 92 % O2 Device: O2  Device: Nasal Cannula O2 Flow Rate: O2 Flow Rate (L/min): 2 L/min  Intake/output summary:  Intake/Output Summary (Last 24 hours) at 04/24/15 1130 Last data filed at 04/23/15 1920  Gross per 24 hour  Intake    480 ml  Output    350 ml  Net    130 ml   LBM:   Baseline Weight: Weight: 75.1 kg (165 lb 9.1 oz) Most recent weight: Weight: 73.3 kg (161 lb 9.6 oz)  Physical Exam:  some what confused, in no distress Diminished bases S1S2 Abdomen soft No edema some clubbing on upper digits         Additional Data Reviewed: Recent Labs     04/22/15  0910  NA  134*  BUN  12  CREATININE  0.45*     Problem List:  Patient Active Problem List   Diagnosis Date Noted  . Chronic diastolic heart failure 43/15/4008  . Essential hypertension 04/21/2015  . Cancer related pain 04/20/2015  . Failure to thrive in adult 04/20/2015  . Diabetes mellitus type 2, controlled 04/20/2015  . Pain 04/20/2015  . Pain, cancer 04/20/2015  . Moderate malnutrition 04/20/2015  . DNR (do not resuscitate)   . Hypercalcemia 04/07/2015  . Respiratory failure 04/07/2015  . Thrombocytopenia 04/07/2015  . Respiratory failure with hypoxia 04/07/2015  . Anemia 04/07/2015  . Encounter for antineoplastic chemotherapy 03/02/2015  . Pancytopenia due to antineoplastic chemotherapy 01/17/2015  . Hyperphosphatemia 12/30/2014  . Transaminitis  12/30/2014  . Hypomagnesemia 12/30/2014  . Nausea with vomiting 12/17/2014  . Dehydration 12/17/2014  . Weakness 12/17/2014  . Hypoalbuminemia 12/17/2014  . Antineoplastic chemotherapy induced pancytopenia 12/17/2014  . Protein-calorie malnutrition, severe 11/28/2014  . Uncontrolled pain 11/26/2014  . Palliative care encounter   . CN (constipation)   . Loss of appetite   . Insomnia   . Hypercalcemia of malignancy 11/23/2014  . Cancer associated pain 11/23/2014  . 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 Bronchitis 08/23/2014  . Mucositis due to antineoplastic  therapy 05/10/2014  . Peripheral edema 05/10/2014  . Small cell lung cancer 03/03/2014  . Mass of lower lobe of left lung 02/01/2014  . Hypertension 08/09/2012  . Hyperlipidemia 08/09/2012  . Diabetes mellitus 08/09/2012  . Overweight(278.02) 08/09/2012  . Nicotine addiction 08/09/2012  . Reactive airway disease 08/09/2012     Palliative Care Assessment & Plan    Code Status:  DNR  Goals of Care:   evolving, to continue with pain management.   Desire for further Chaplaincy support:no  3. Symptom Management:   continue current regimen, noted.   4. Palliative Prophylaxis:  Stool Softener: yes  5. Prognosis: Unable to determine  5. Discharge Planning: pending clinical course   Care plan was discussed with patient, RN Josph Macho.  Offered to call the patient's sister to give her an update, the patient doesn't want [place] to call his sister, he wishes to notify her himself.   Thank you for allowing the Palliative Medicine Team to assist in the care of this patient.   Time In: 1100 Time Out: 1125 Total Time 25 Prolonged Time Billed  no     Greater than 50%  of this time was spent counseling and coordinating care related to the above assessment and plan.  Juline Patch, MD  [phone]  04/24/2015, 11:30 AM  Please contact Palliative Medicine Team phone at [phone] for questions and concerns.

## 2015-04-24 NOTE — Care Management Note (Signed)
Case Management Note  Patient Details  Name: AZELL BILL MRN: [id number] Date of Birth: 07/04/1961  Subjective/Objective:          54 yo admitted with cancer related pain. Hx of lung cancer          Action/Plan: From home with relatives  Expected Discharge Date:                  Expected Discharge Plan:  [place]  In-House Referral:     Discharge planning Services  CM Consult  Post Acute Care Choice:    Choice offered to:     DME Arranged:    DME Agency:     HH Arranged:    [place] Agency:     Status of Service:  In process, will continue to follow  Medicare Important Message Given:    Date Medicare IM Given:    Medicare IM give by:    Date Additional Medicare IM Given:    Additional Medicare Important Message give by:     If discussed at [place] of Stay Meetings, dates discussed:    Additional Comments: Pt set up with [facility] for PT and home 02 when discharged home on 04/08/15. Will need resumption orders if pt is to DC with continued [facility] services. Palliative care following pt and assisting with pain med regimen. CM will continue to follow Lynnell Catalan, RN 04/24/2015, 3:07 PM

## 2015-04-24 NOTE — Progress Notes (Signed)
PROGRESS NOTE  Kerry Peck:[id number] DOB: 04-24-1961 DOA: 04/19/2015 PCP: [name], MD  HPI/Recap of past 41 hours: 54 year old male with past medical history of stage IV small cell lung cancer on chemotherapy admitted on 8/25 for uncontrolled pain. Palliative care has been consulted.  Patient doing better today. Medications adjusted and pain initially better. Patient changed over to by mouth medications only on 8/26.  On 8/27 up in more severe pain beginning to increases in both oral medications. Initially this helped, but then patient woke up in more severe pain on same aspect of the left leg and side. Very anxious.  Scheduled benzodiazepine's plus IV Dilaudid when necessary added.  Today, patient states pain is better controlled although he still has a few painful episodes. He is drowsy and at times confused, likely from medication. A CT scan was done to rule out occult fracture and it was unrevealing.   Assessment/Plan:  Active Problems:   Small cell lung cancer with secondary pain uncontrolled: Worse today. Have increased both the frequency of his instant release plus the dose of his extended release. Hoping that this should give him some improvement.  No evidence of occult fracture seen on CT. Will titrate down benzodiazepines  given drowsiness and confusion.    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 Better today.     Diabetes mellitus type 2, controlled: CBGs under 200    DNR (do not resuscitate)   Moderate malnutrition: Patient meets criteria in the context of chronic illness. Seen by nutrition. Continued on ensure enlive twice a day   Chronic diastolic heart failure: Noted blood pressure starting to trend up. Have restarted Lasix.  Stable.    Essential hypertension: Blood pressure, likely secondary to pain   Code Status: DO NOT RESUSCITATE  Family Communication: left message with sister  Disposition Plan: anticipated discharge once pain  controlled, possibly within few days   Consultants:   palliative care  Oncology  Procedures:  None  Antibiotics:  None   Objective: BP 131/87 mmHg  Pulse 114  Temp(Src) 97.8 F (36.6 C) (Oral)  Resp 18  Wt 73.3 kg (161 lb 9.6 oz)  SpO2 96%  Intake/Output Summary (Last 24 hours) at 04/24/15 1459 Last data filed at 04/24/15 1400  Gross per 24 hour  Intake    765 ml  Output   1025 ml  Net   -260 ml   Filed Weights   04/22/15 0440 04/22/15 1700 04/24/15 0541  Weight: 75.1 kg (165 lb 9.1 oz) 70.988 kg (156 lb 8 oz) 73.3 kg (161 lb 9.6 oz)    Exam:   General:  Drowsy, occasionally gets confused.  Cardiovascular: regular rate and rhythm, S1-S2, borderline tachycardia  Respiratory: clear to auscultation bilaterally, moderate inspiratory effort  Abdomen: soft, nontender, nondistended, positive bowel sounds Musculoskeletal: no clubbing or cyanosis or edema  Data Reviewed: Basic Metabolic Panel:  Recent Labs Lab 04/20/15 0037 04/22/15 0910  NA 136 134*  K 3.5 3.3*  CL 99* 98*  CO2 27 26  GLUCOSE 160* 107*  BUN 18 12  CREATININE 0.65 0.45*  CALCIUM 10.4* 9.6  MG 1.5*  --    Liver Function Tests:  Recent Labs Lab 04/20/15 0037  AST 137*  ALT 43  ALKPHOS 758*  BILITOT 0.8  PROT 6.3*  ALBUMIN 2.5*   No results for input(s): LIPASE, AMYLASE in the last 168 hours. No results for input(s): AMMONIA in the last 168 hours. CBC:  Recent Labs Lab 04/20/15 0037 04/21/15  1020  WBC 7.9 11.0*  NEUTROABS 5.9  --   HGB 7.7* 8.8*  HCT 23.3* 27.3*  MCV 95.1 95.5  PLT 114* 145*   Cardiac Enzymes:   No results for input(s): CKTOTAL, CKMB, CKMBINDEX, TROPONINI in the last 168 hours. BNP (last 3 results)  Recent Labs  04/07/15 1033 04/21/15 1020  BNP 109.4* 119.5*    ProBNP (last 3 results) No results for input(s): PROBNP in the last 8760 hours.  CBG:  Recent Labs Lab 04/23/15 1153 04/23/15 1725 04/23/15 2133 04/24/15 0745  04/24/15 1204  GLUCAP 246* 202* 185* 159* 129*    No results found for this or any previous visit (from the past 240 hour(s)).   Studies: No results found.  Scheduled Meds: . albuterol  2.5 mg Nebulization Q6H WA  . dexamethasone  4 mg Oral Daily  . feeding supplement (ENSURE ENLIVE)  237 mL Oral BID BM  . furosemide  40 mg Oral Daily  . insulin aspart  0-9 Units Subcutaneous TID WC  . LORazepam  0.5 mg Oral BID  . LORazepam  1 mg Oral QHS  . mometasone-formoterol  2 puff Inhalation BID  . OxyCODONE  80 mg Oral 3 times per day  . pantoprazole  40 mg Oral Daily  . pyridOXINE  100 mg Oral Daily  . senna-docusate  2 tablet Oral BID  . vitamin C  500 mg Oral Daily    Continuous Infusions:    Time spent: 25 minutes  [facility] Pager [phone], please contact night-coverage at [email or web address], password [facility]8/29/2016, 2:59 PM  LOS: 4 days

## 2015-04-25 DIAGNOSIS — K5909 Other constipation: Secondary | ICD-10-CM

## 2015-04-25 DIAGNOSIS — T402X5A Adverse effect of other opioids, initial encounter: Secondary | ICD-10-CM

## 2015-04-25 LAB — GLUCOSE, CAPILLARY
GLUCOSE-CAPILLARY: 303 mg/dL — AB (ref 65–99)
GLUCOSE-CAPILLARY: 145 mg/dL — AB (ref 65–99)
Glucose-Capillary: 138 mg/dL — ABNORMAL HIGH (ref 65–99)
Glucose-Capillary: 307 mg/dL — ABNORMAL HIGH (ref 65–99)

## 2015-04-25 MED ORDER — FLEET ENEMA 7-19 GM/118ML RE ENEM
1.0000 | ENEMA | Freq: Once | RECTAL | Status: AC
  Administered 2015-04-25: 1 via RECTAL
  Filled 2015-04-25: qty 1

## 2015-04-25 NOTE — Progress Notes (Signed)
PROGRESS NOTE  MALEAK BRAZZEL ZOX:[id number] DOB: May 20, 1961 DOA: 04/19/2015 PCP: [name], MD  HPI/Recap of past 32 hours: 54 year old male with past medical history of stage IV small cell lung cancer on chemotherapy admitted on 8/25 for uncontrolled pain. Palliative care has been consulted.  Patient doing better today. Medications adjusted and pain initially better. Patient changed over to by mouth medications only on 8/26.  On 8/27 up in more severe pain beginning to increases in both oral medications. Initially this helped, but then patient woke up in more severe pain on same aspect of the left leg and side. Very anxious.  Scheduled benzodiazepine's plus IV Dilaudid when necessary added.  By 8/29, patient states pain is better controlled although he still has a few painful episodes. He is drowsy and at times confused, likely from medication. A CT scan was done to rule out occult fracture and it was unrevealing.  Scheduled benzodiazepine's decreased in strength. Patient looked to be somewhat comfortable, but then awakens and starts moaning in pain. States he still not had a bowel movement in some time.   Assessment/Plan:  Active Problems:   Small cell lung cancer with secondary pain uncontrolled: Worse today. Have increased both the frequency of his instant release plus the dose of his extended release. Hoping that this should give him some improvement.  No evidence of occult fracture seen on CT. 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 Recheck labs in the morning     Diabetes mellitus type 2, controlled: CBGs under 200    DNR (do not resuscitate)   Moderate malnutrition: Patient meets criteria in the context of chronic illness. Seen by nutrition. Continued on ensure enlive twice a day   Chronic diastolic heart failure: Noted blood pressure starting to trend up. Have restarted Lasix.   Stable.  Constipation secondary to opiates: No relief with MiraLAX. We'll try Fleet enema    Essential hypertension: Blood pressure, likely secondary to pain   Code Status: DO NOT RESUSCITATE  Family Communication: left message with sister  Disposition Plan: anticipated discharge once pain controlled, possibly within few days   Consultants:   palliative care  Oncology  Procedures:  None  Antibiotics:  None   Objective: BP 138/88 mmHg  Pulse 100  Temp(Src) 97.5 F (36.4 C) (Oral)  Resp 18  Ht '6\' 2"'$  (1.88 m)  Wt 73.6 kg (162 lb 4.1 oz)  BMI 20.82 kg/m2  SpO2 96%  Intake/Output Summary (Last 24 hours) at 04/25/15 1713 Last data filed at 04/25/15 0700  Gross per 24 hour  Intake    340 ml  Output    300 ml  Net     40 ml   Filed Weights   04/22/15 1700 04/24/15 0541 04/25/15 0702  Weight: 70.988 kg (156 lb 8 oz) 73.3 kg (161 lb 9.6 oz) 73.6 kg (162 lb 4.1 oz)    Exam:   General:  Drowsy, wakes up moaning in pain  Cardiovascular: regular rate and rhythm, S1-S2, borderline tachycardia  Respiratory: clear to auscultation bilaterally, moderate inspiratory effort  Abdomen: soft, nontender, distended, hypoactive bowel sounds Musculoskeletal: no clubbing or cyanosis or edema  Data Reviewed: Basic Metabolic Panel:  Recent Labs Lab 04/20/15 0037 04/22/15 0910  NA 136 134*  K 3.5 3.3*  CL 99* 98*  CO2 27 26  GLUCOSE 160* 107*  BUN 18 12  CREATININE 0.65 0.45*  CALCIUM 10.4* 9.6  MG 1.5*  --  Liver Function Tests:  Recent Labs Lab 04/20/15 0037  AST 137*  ALT 43  ALKPHOS 758*  BILITOT 0.8  PROT 6.3*  ALBUMIN 2.5*   No results for input(s): LIPASE, AMYLASE in the last 168 hours. No results for input(s): AMMONIA in the last 168 hours. CBC:  Recent Labs Lab 04/20/15 0037 04/21/15 1020  WBC 7.9 11.0*  NEUTROABS 5.9  --   HGB 7.7* 8.8*  HCT 23.3* 27.3*  MCV 95.1 95.5  PLT 114* 145*   Cardiac Enzymes:   No results for input(s):  CKTOTAL, CKMB, CKMBINDEX, TROPONINI in the last 168 hours. BNP (last 3 results)  Recent Labs  04/07/15 1033 04/21/15 1020  BNP 109.4* 119.5*    ProBNP (last 3 results) No results for input(s): PROBNP in the last 8760 hours.  CBG:  Recent Labs Lab 04/24/15 1708 04/24/15 2109 04/25/15 0740 04/25/15 1155 04/25/15 1648  GLUCAP 217* 303* 145* 138* 307*    No results found for this or any previous visit (from the past 240 hour(s)).   Studies: No results found.  Scheduled Meds: . albuterol  2.5 mg Nebulization Q6H WA  . dexamethasone  4 mg Oral Daily  . feeding supplement (ENSURE ENLIVE)  237 mL Oral BID BM  . furosemide  40 mg Oral Daily  . insulin aspart  0-9 Units Subcutaneous TID WC  . LORazepam  0.25 mg Oral BID  . LORazepam  1 mg Oral QHS  . mometasone-formoterol  2 puff Inhalation BID  . OxyCODONE  80 mg Oral 3 times per day  . pantoprazole  40 mg Oral Daily  . pyridOXINE  100 mg Oral Daily  . senna-docusate  2 tablet Oral BID  . sodium phosphate  1 enema Rectal Once  . vitamin C  500 mg Oral Daily    Continuous Infusions:    Time spent: 15 minutes  Annita Brod  [facility] Pager [phone], please contact night-coverage at [email or web address], password [email or web address]8/30/2016, 5:13 PM  LOS: 5 days

## 2015-04-25 NOTE — Progress Notes (Signed)
CSW received phone call from RN that pt requesting to speak with CSW.   CSW met with pt at bedside to provide support.  Full psychosocial assessment to follow.  Alison Murray, MSW, [place] [facility] [phone]

## 2015-04-25 NOTE — Progress Notes (Signed)
Patient requesting a sleep aid. Paged NP on call and ambien prn ordered. Patient reports he "took one of his sister's pills before and it worked".

## 2015-04-25 NOTE — Plan of Care (Signed)
Problem: Phase I Progression Outcomes Goal: Pain controlled with appropriate interventions Outcome: Progressing Patient report pain "jumps around". C/o pain in thighs, shoulders, left hip, and right chest at times. It matter of few minutes he c/o's of it in multiple areas. C/o itching to back and nose. On scheduled and prns.

## 2015-04-26 DIAGNOSIS — G893 Neoplasm related pain (acute) (chronic): Principal | ICD-10-CM

## 2015-04-26 DIAGNOSIS — I5032 Chronic diastolic (congestive) heart failure: Secondary | ICD-10-CM

## 2015-04-26 LAB — BASIC METABOLIC PANEL
ANION GAP: 13 (ref 5–15)
BUN: 22 mg/dL — AB (ref 6–20)
CHLORIDE: 94 mmol/L — AB (ref 101–111)
CO2: 31 mmol/L (ref 22–32)
Calcium: 9.9 mg/dL (ref 8.9–10.3)
Creatinine, Ser: 0.48 mg/dL — ABNORMAL LOW (ref 0.61–1.24)
GFR calc Af Amer: >60 mL/min (ref >60)
GLUCOSE: 161 mg/dL — AB (ref 65–99)
POTASSIUM: 3.4 mmol/L — AB (ref 3.5–5.1)
SODIUM: 138 mmol/L (ref 135–145)

## 2015-04-26 LAB — CBC
HEMATOCRIT: 24.6 % — AB (ref 39.0–52.0)
HEMOGLOBIN: 8.1 g/dL — AB (ref 13.0–17.0)
MCH: 31.3 pg (ref 26.0–34.0)
MCHC: 32.9 g/dL (ref 30.0–36.0)
MCV: 95 fL (ref 78.0–100.0)
Platelets: 104 10*3/uL — ABNORMAL LOW (ref 150–400)
RBC: 2.59 MIL/uL — ABNORMAL LOW (ref 4.22–5.81)
RDW: 19.1 % — AB (ref 11.5–15.5)
WBC: 10.7 10*3/uL — AB (ref 4.0–10.5)

## 2015-04-26 LAB — GLUCOSE, CAPILLARY
GLUCOSE-CAPILLARY: 129 mg/dL — AB (ref 65–99)
Glucose-Capillary: 137 mg/dL — ABNORMAL HIGH (ref 65–99)
Glucose-Capillary: 178 mg/dL — ABNORMAL HIGH (ref 65–99)
Glucose-Capillary: 391 mg/dL — ABNORMAL HIGH (ref 65–99)

## 2015-04-26 MED ORDER — LORAZEPAM 0.5 MG PO TABS: 0.2500 mg | ORAL_TABLET | Freq: Two times a day (BID) | ORAL | Status: DC | PRN

## 2015-04-26 MED ORDER — POTASSIUM CHLORIDE 10 MEQ/100ML IV SOLN
10.0000 meq | INTRAVENOUS | Status: DC
  Filled 2015-04-26 (×2): qty 100

## 2015-04-26 MED ORDER — LORAZEPAM 1 MG PO TABS
1.0000 mg | ORAL_TABLET | Freq: Every evening | ORAL | Status: DC | PRN
  Administered 2015-04-27: 1 mg via ORAL
  Filled 2015-04-26: qty 1

## 2015-04-26 MED ORDER — DEXTROSE-NACL 5-0.9 % IV SOLN
INTRAVENOUS | Status: DC
  Administered 2015-04-26: 12:00:00 via INTRAVENOUS

## 2015-04-26 MED ORDER — MORPHINE SULFATE 25 MG/ML IV SOLN
3.0000 mg/h | INTRAVENOUS | Status: DC
  Administered 2015-04-26 – 2015-04-27 (×3): 3 mg/h via INTRAVENOUS
  Administered 2015-04-27: 4.5 mg/h via INTRAVENOUS
  Administered 2015-04-27: 3 mg/h via INTRAVENOUS
  Administered 2015-04-27: 4.5 mg/h via INTRAVENOUS
  Administered 2015-04-27 (×3): 3 mg/h via INTRAVENOUS
  Administered 2015-04-27: 4.5 mg/h via INTRAVENOUS
  Administered 2015-04-27 (×2): 3 mg/h via INTRAVENOUS
  Administered 2015-04-27: 4.5 mg/h via INTRAVENOUS
  Administered 2015-04-27: 3 mg/h via INTRAVENOUS
  Administered 2015-04-28: 0.5 mg/h via INTRAVENOUS
  Administered 2015-04-28: 7.5 mg/h via INTRAVENOUS
  Administered 2015-04-28: 3 mg/h via INTRAVENOUS
  Administered 2015-04-28 (×2): 7.5 mg/h via INTRAVENOUS
  Filled 2015-04-26 (×2): qty 10

## 2015-04-26 NOTE — Progress Notes (Signed)
PROGRESS NOTE  Kerry Peck OZD:[id number] DOB: [birth year] DOA: 04/19/2015 PCP: [name], MD  HPI/Recap of past 21 hours: 54 year old male with past medical history of stage IV small cell lung cancer on chemotherapy admitted on 8/25 for uncontrolled pain. Palliative care has been consulted.   Medications adjusted and pain initially better. Patient changed over to by mouth medications only on 8/26.  On 8/27 up in more severe pain beginning to increases in both oral medications. Initially this helped, but then patient woke up in more severe pain on same aspect of the left leg and side. Very anxious.  Scheduled benzodiazepine's plus IV Dilaudid when necessary added.  benzo change to PRN.  Palliative to meet with sister  to discussed goals of care     Assessment/Plan: Small cell lung cancer with secondary pain uncontrolled: No evidence of occult fracture seen on CT. palliative care assisting. Dr Julien Nordmann to follow on patient today.  Patient sleepy, wake up and answer some questions.  I have change ativan to PRN. Will need to be careful with oversedation. Palliative to dicussed with family today, goals of care.  Pain was not well controlled on oral medications. Medications increased 8-30.   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   Hypokalemia; replete IV.   Diabetes mellitus type 2, controlled: CBGs under 200 DNR (do not resuscitate) Moderate malnutrition: Patient meets criteria in the context of chronic illness. Seen by nutrition. Continued on ensure enlive twice a day Chronic diastolic heart failure: hold lasix. Not eating. BUN increasing, I have IV fluids, small amount.  Constipation secondary to opiates:  Fleet enema, suppository.  Essential hypertension: Blood pressure, likely secondary to pain   Code Status: DO NOT RESUSCITATE  Family Communication: none at bedside.   Disposition Plan: awaiting palliative care meeting with family.     Consultants:   palliative care  Oncology  Procedures:  None  Antibiotics:  None   Objective: BP 142/91 mmHg  Pulse 108  Temp(Src) 98.1 F (36.7 C) (Oral)  Resp 17  Ht '6\' 2"'$  (1.88 m)  Wt 73.528 kg (162 lb 1.6 oz)  BMI 20.80 kg/m2  SpO2 98%  Intake/Output Summary (Last 24 hours) at 04/26/15 1530 Last data filed at 04/26/15 1011  Gross per 24 hour  Intake    275 ml  Output    100 ml  Net    175 ml   Filed Weights   04/24/15 0541 04/25/15 0702 04/25/15 2044  Weight: 73.3 kg (161 lb 9.6 oz) 73.6 kg (162 lb 4.1 oz) 73.528 kg (162 lb 1.6 oz)    Exam:   General:  Drowsy, wakes up answer some questions.   Cardiovascular: regular rate and rhythm, S1-S2, borderline tachycardia  Respiratory: clear to auscultation bilaterally, moderate inspiratory effort  Abdomen: soft, nontender, distended, hypoactive bowel sounds Musculoskeletal: no clubbing or cyanosis or edema  Data Reviewed: Basic Metabolic Panel:  Recent Labs Lab 04/20/15 0037 04/22/15 0910 04/26/15 0454  NA 136 134* 138  K 3.5 3.3* 3.4*  CL 99* 98* 94*  CO2 '27 26 31  '$ GLUCOSE 160* 107* 161*  BUN 18 12 22*  CREATININE 0.65 0.45* 0.48*  CALCIUM 10.4* 9.6 9.9  MG 1.5*  --   --    Liver Function Tests:  Recent Labs Lab 04/20/15 0037  AST 137*  ALT 43  ALKPHOS 758*  BILITOT 0.8  PROT 6.3*  ALBUMIN 2.5*   No results for input(s): LIPASE, AMYLASE in the last 168 hours. No results for  input(s): AMMONIA in the last 168 hours. CBC:  Recent Labs Lab 04/20/15 0037 04/21/15 1020 04/26/15 0454  WBC 7.9 11.0* 10.7*  NEUTROABS 5.9  --   --   HGB 7.7* 8.8* 8.1*  HCT 23.3* 27.3* 24.6*  MCV 95.1 95.5 95.0  PLT 114* 145* 104*   Cardiac Enzymes:   No results for input(s): CKTOTAL, CKMB, CKMBINDEX, TROPONINI in the last 168 hours. BNP (last 3 results)  Recent Labs  04/07/15 1033 04/21/15 1020  BNP 109.4* 119.5*    ProBNP (last 3 results) No results for input(s): PROBNP in the last  8760 hours.  CBG:  Recent Labs Lab 04/25/15 0740 04/25/15 1155 04/25/15 1648 04/26/15 0751 04/26/15 1218  GLUCAP 145* 138* 307* 137* 129*    No results found for this or any previous visit (from the past 240 hour(s)).   Studies: No results found.  Scheduled Meds: . albuterol  2.5 mg Nebulization Q6H WA  . dexamethasone  4 mg Oral Daily  . feeding supplement (ENSURE ENLIVE)  237 mL Oral BID BM  . insulin aspart  0-9 Units Subcutaneous TID WC  . LORazepam  1 mg Oral QHS  . mometasone-formoterol  2 puff Inhalation BID  . OxyCODONE  80 mg Oral 3 times per day  . pantoprazole  40 mg Oral Daily  . pyridOXINE  100 mg Oral Daily  . senna-docusate  2 tablet Oral BID  . vitamin C  500 mg Oral Daily    Continuous Infusions: . dextrose 5 % and 0.9% NaCl 50 mL/hr at 04/26/15 1145     Time spent: 15 minutes  Regalado, Belkys A  [facility] Pager [phone]. [id number]-7AM, please contact night-coverage at [email or web address], password [email or web address]8/31/2016, 3:30 PM  LOS: 6 days

## 2015-04-26 NOTE — Progress Notes (Signed)
Rt gave pt his neb, mdi and flutter valve. Pt needs help and encouragement with mdi and flutter.

## 2015-04-26 NOTE — Clinical Social Work Note (Signed)
Clinical Social Work Assessment  Patient Details  Name: Kerry Peck MRN: [id number] Date of Birth: 18-May-1961  Date of referral:  04/25/15               Reason for consult:  Other (Comment Required) (RN called stating pt asking about how to apply for disability)                Permission sought to share information with:  Family Supports Permission granted to share information::  No  Name::        Agency::     Relationship::     Contact Information:     Housing/Transportation Living arrangements for the past 2 months:  Single Family Home Source of Information:  Patient Patient Interpreter Needed:  None Criminal Activity/Legal Involvement Pertinent to Current Situation/Hospitalization:  No - Comment as needed Significant Relationships:  Siblings Lives with:  Self Do you feel safe going back to the place where you live?  Yes Need for family participation in patient care:  No (Coment)  Care giving concerns:  CSW received phone call from RN stating that pt requesting to receive information about how to apply for disability.   Social Worker assessment / plan:  CSW received phone call from RN stating that pt requesting to speak with CSW.  CSW met with pt at bedside. CSW introduced self and explained role. CSW discussed with pt that RN stated that pt was interested in learning about how to apply for disability. Pt stated that this was not the case as he has disability through his job. Pt stated that he is concerned about how to keep his house and make sure his house is given to his sister. CSW explained that pt would need to consult with his bank or an attorney on an outpatient basis in order to get a living will in place. Pt stated that he had plans to do this, but pt remains very concerned about make sure his sister is given his home. Pt stated that he has already made it to where his sister can have access to his financials in order to pay his bills. CSW provided emotional support as pt  discussed his diagnosis of cancer and stated, "I know I am not getting any better." Pt became tearful and said "the last thing I want to do is take my own life". CSW asked follow up questions to this statement and pt states that he has not thoughts of harming himself and no plan. CSW provided emotional counseling as pt discussed that when he is "gone" that he wants to make sure it "looks like I did something in life". CSW provided space for pt to reflect on his life. He shared with this CSW that he worked for the [employer] for 28 years. 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 Pt smiled while discussing this passion.   CSW to continue to follow to provide support. CSW provided pt with [name], [place] Worker contact information for support.   CSW to continue to follow.  Employment status:  Disabled (Comment on whether or not currently receiving Disability) Insurance information:  Managed Care PT Recommendations:  Not assessed at this time Information / Referral to community resources:  Other (Comment Required) (contact information for [name], [place] CSW)  Patient/Family's Response to care:  Pt alert and oriented x 4, however did display some forgetfulness during conversation.  Pt is hopeful to get his affairs in order as he recognizes that he is not "getting any better". Pt main support is his sister. Pt appreciative of CSW visit and support.  Patient/Family's Understanding of and Emotional Response to Diagnosis, Current Treatment, and Prognosis:  Pt displayed insight into his medical condition and expressed that he recognizes that he is declining.   Emotional Assessment Appearance:  Appears stated age Attitude/Demeanor/Rapport:  Other (appropriate) Affect (typically observed):  Sad Orientation:  Oriented to Self, Oriented to Place, Oriented to  Time, Oriented to Situation Alcohol / Substance use:  Not  Applicable Psych involvement (Current and /or in the community):  No (Comment)  Discharge Needs  Concerns to be addressed:  Financial / Insurance Concerns Readmission within the last 30 days:  Yes Current discharge risk:  Chronically ill Barriers to Discharge:  No Barriers Identified   Wood, [place], LCSW 04/26/2015, 11:11 AM  [phone]

## 2015-04-26 NOTE — Progress Notes (Signed)
Daily Progress Note   Patient Name: Kerry Peck       Date: 04/26/2015 DOB: [birth year]  Age: 54 y.o. MRN#: [id number] Attending Physician: Elmarie Shiley, MD Primary Care Physician: [name], MD Admit Date: 04/19/2015  Reason for Consultation/Follow-up: Establishing goals of care, Non pain symptom management, Pain control and Psychosocial/spiritual support  Subjective:     -patient has been very uncomfortable over the past 5 days with pain control, he tells me "I know I'm getting worse every day", we discussed his focus of care, "I just want this pain to go away", discussed possibility of a hospice facility and he agrees this would be best for him.  He cannot go home without a 24/7 caregiver and his sister works.  -discussed with Dr Worthy Flank RN Cecile Hearing, received message that Dr Julien Nordmann agrees that a hospice facility would benefit this patient  -spoke to sister [name] by telephone and I will meet with her today at 1600  -afternoon meeting/  patient and family wish to shift focus to comfort   Length of Stay: 6 days  Current Medications: Scheduled Meds:  . albuterol  2.5 mg Nebulization Q6H WA  . dexamethasone  4 mg Oral Daily  . feeding supplement (ENSURE ENLIVE)  237 mL Oral BID BM  . furosemide  40 mg Oral Daily  . insulin aspart  0-9 Units Subcutaneous TID WC  . LORazepam  0.25 mg Oral BID  . LORazepam  1 mg Oral QHS  . mometasone-formoterol  2 puff Inhalation BID  . OxyCODONE  80 mg Oral 3 times per day  . pantoprazole  40 mg Oral Daily  . pyridOXINE  100 mg Oral Daily  . senna-docusate  2 tablet Oral BID  . vitamin C  500 mg Oral Daily    Continuous Infusions:    PRN Meds: acetaminophen **OR** acetaminophen, albuterol, alum & mag hydroxide-simeth, benzocaine, bisacodyl, bismuth subsalicylate, diphenhydrAMINE **OR** diphenhydrAMINE, HYDROmorphone (DILAUDID) injection, iohexol, MUSCLE RUB, ondansetron **OR** ondansetron (ZOFRAN) IV, ondansetron,  oxyCODONE, polyethylene glycol, zolpidem  Palliative Performance Scale: 20%     Vital Signs: BP 147/89 mmHg  Pulse 110  Temp(Src) 98.1 F (36.7 C) (Oral)  Resp 20  Ht '6\' 2"'$  (1.88 m)  Wt 73.528 kg (162 lb 1.6 oz)  BMI 20.80 kg/m2  SpO2 97% SpO2: SpO2: 97 % O2 Device: O2 Device: Nasal Cannula O2 Flow Rate: O2 Flow Rate (L/min): 2 L/min  Intake/output summary:   Intake/Output Summary (Last 24 hours) at 04/26/15 0836 Last data filed at 04/25/15 2045  Gross per 24 hour  Intake    675 ml  Output      0 ml  Net    675 ml   LBM: Last BM Date: 04/23/15 Baseline Weight: Weight: 75.1 kg (165 lb 9.1 oz) Most recent weight: Weight: 73.528 kg (162 lb 1.6 oz)  Physical Exam:  General: chronically ill appearing, lying flat, moans in pain on reposition HEENT: dry  buccal membranes, no exudate CVS: tachycardic Resp: CTA Neuro: alert but lethargic and having difficulty concentrating  Additional Data Reviewed: Recent Labs     04/26/15  0454  WBC  10.7*  HGB  8.1*  PLT  104*  NA  138  BUN  22*  CREATININE  0.48*     Problem List:  Patient Active Problem List   Diagnosis Date Noted  . Bone metastases   . Encounter for palliative care   . Absolute anemia   . Chronic diastolic heart failure  04/21/2015  . Essential hypertension 04/21/2015  . Cancer related pain 04/20/2015  . Failure to thrive in adult 04/20/2015  . Diabetes mellitus type 2, controlled 04/20/2015  . Pain 04/20/2015  . Pain, cancer 04/20/2015  . Moderate malnutrition 04/20/2015  . DNR (do not resuscitate)   . Hypercalcemia 04/07/2015  . Respiratory failure 04/07/2015  . Thrombocytopenia 04/07/2015  . Respiratory failure with hypoxia 04/07/2015  . Anemia 04/07/2015  . Encounter for antineoplastic chemotherapy 03/02/2015  . Pancytopenia due to antineoplastic chemotherapy 01/17/2015  . Hyperphosphatemia 12/30/2014  . Transaminitis 12/30/2014  . Hypomagnesemia 12/30/2014  . Nausea with vomiting  12/17/2014  . Dehydration 12/17/2014  . Weakness 12/17/2014  . Hypoalbuminemia 12/17/2014  . Antineoplastic chemotherapy induced pancytopenia 12/17/2014  . Protein-calorie malnutrition, severe 11/28/2014  . Uncontrolled pain 11/26/2014  . Palliative care encounter   . CN (constipation)   . Loss of appetite   . Insomnia   . Hypercalcemia of malignancy 11/23/2014  . Cancer associated pain 11/23/2014  . 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 Bronchitis 08/23/2014  . Mucositis due to antineoplastic therapy 05/10/2014  . Peripheral edema 05/10/2014  . Small cell lung cancer 03/03/2014  . Mass of lower lobe of left lung 02/01/2014  . Hypertension 08/09/2012  . Hyperlipidemia 08/09/2012  . Diabetes mellitus 08/09/2012  . Overweight(278.02) 08/09/2012  . Nicotine addiction 08/09/2012  . Reactive airway disease 08/09/2012     Palliative Care Assessment & Plan    Code Status:  DNR  Goals of Care: - focus is comfort, quality and dignity  3. Symptom Management:  Bowel regiment: Dulcolax supp   Pain: DC oral agents/ Convert oral agent equivalent  to an IV Morphine gtt,            Initiate base rate at '3mg'$ /hr with bolus order, for a maximum rate of 10 mg/hr  Insomnia: Ativan 1 mg po qhs    Prognosis:  Likely less than 2 weeks/  With greater pain control needs, little po intake, focus being comfort patietn would benefit from hospice facility  5. Discharge Planning:  Evaluate for hospice facility in morning   Care plan was discussed with Dr Tyrell Antonio  Thank you for allowing the Palliative Medicine Team to assist in the care of this patient.   Time In:  1600 Time Out: 1700 Total Time 60 minutes min Prolonged Time Billed  no     Greater than 50%  of this time was spent counseling and coordinating care related to the above assessment and plan.     Knox Royalty, NP  04/26/2015, 8:36 AM  Please contact Palliative Medicine Team phone at [phone] for questions and  concerns.

## 2015-04-26 NOTE — Progress Notes (Signed)
Administered enema with very minimum results.

## 2015-04-27 LAB — GLUCOSE, CAPILLARY: Glucose-Capillary: 324 mg/dL — ABNORMAL HIGH (ref 65–99)

## 2015-04-27 MED ORDER — ALBUTEROL SULFATE (2.5 MG/3ML) 0.083% IN NEBU
2.5000 mg | INHALATION_SOLUTION | Freq: Three times a day (TID) | RESPIRATORY_TRACT | Status: DC
  Administered 2015-04-27 – 2015-04-28 (×3): 2.5 mg via RESPIRATORY_TRACT
  Filled 2015-04-27 (×3): qty 3

## 2015-04-27 MED ORDER — LORAZEPAM 2 MG/ML IJ SOLN
1.0000 mg | Freq: Four times a day (QID) | INTRAMUSCULAR | Status: DC
  Administered 2015-04-27 – 2015-04-28 (×4): 1 mg via INTRAVENOUS
  Filled 2015-04-27 (×5): qty 1

## 2015-04-27 MED ORDER — INSULIN ASPART 100 UNIT/ML ~~LOC~~ SOLN
0.0000 [IU] | Freq: Three times a day (TID) | SUBCUTANEOUS | Status: DC
  Administered 2015-04-27: 7 [IU] via SUBCUTANEOUS

## 2015-04-27 NOTE — Care Management Note (Signed)
Case Management Note  Patient Details  Name: Kerry Peck MRN: [id number] Date of Birth: [birth year]     Expected Discharge Date:                  Expected Discharge Plan:  [place]  In-House Referral:  Clinical Social Work  Discharge planning Services  CM Consult  Post Acute Care Choice:    Choice offered to:     DME Arranged:    DME Agency:     HH Arranged:    [place] Agency:     Status of Service:  Completed, signed off  Medicare Important Message Given:    Date Medicare IM Given:    Medicare IM give by:    Date Additional Medicare IM Given:    Additional Medicare Important Message give by:     If discussed at [place] of Stay Meetings, dates discussed:    Additional Comments: Pt now for residential hospice. [facility] informed their services would not be needed at [place]. Lynnell Catalan, RN 04/27/2015, 11:46 AM

## 2015-04-27 NOTE — Care Management Note (Signed)
Case Management Note  Patient Details  Name: FREDERICH MONTILLA MRN: [id number] Date of Birth: Sep 26, 1960    Expected Discharge Date:                  Expected Discharge Plan:  Home w Hospice Care  In-House Referral:  Clinical Social Work  Discharge planning Services  CM Consult  Post Acute Care Choice:  Hospice Choice offered to:  Patient, Sibling  DME Arranged:    DME Agency:     HH Arranged:  Disease Management [place] Agency:  [facility]  Status of Service:  In process, will continue to follow  Medicare Important Message Given:    Date Medicare IM Given:    Medicare IM give by:    Date Additional Medicare IM Given:    Additional Medicare Important Message give by:     If discussed at [place] of Stay Meetings, dates discussed:    Additional Comments: This CM was just alerted by CSW that pt and sister have decided pt should go home with hospice care at [place]. This CM spoke with pt and sister [name] at bedside to offer choice for home hospice services. [facility] chosen and rep contacted to give referral. CM will continue to follow and assist with needs. Lynnell Catalan, RN 04/27/2015, 2:24 PM

## 2015-04-27 NOTE — Progress Notes (Signed)
Daily Progress Note   Patient Name: Kerry Peck       Date: 04/27/2015 DOB: 1961-08-11  Age: 54 y.o. MRN#: [id number] Attending Physician: Elmarie Shiley, MD Primary Care Physician: [name], MD Admit Date: 04/19/2015  Reason for Consultation/Follow-up: Establishing goals of care, Non pain symptom management, Pain control and Psychosocial/spiritual support  Subjective:     -patient appears comfortable today on Morphine gtt, hopeful for residential hospice, will write for choice  -focus of care is comfort       -no further chemotherapy, diagnostics, avoid rehospitalizations   -left  sister [name] a telephone message update   Length of Stay: 7 days  Current Medications: Scheduled Meds:  . albuterol  2.5 mg Nebulization TID  . dexamethasone  4 mg Oral Daily  . feeding supplement (ENSURE ENLIVE)  237 mL Oral BID BM  . insulin aspart  0-9 Units Subcutaneous TID WC  . mometasone-formoterol  2 puff Inhalation BID  . senna-docusate  2 tablet Oral BID    Continuous Infusions: . morphine 3 mg/hr (04/27/15 0841)    PRN Meds: acetaminophen **OR** acetaminophen, albuterol, alum & mag hydroxide-simeth, benzocaine, bisacodyl, bismuth subsalicylate, diphenhydrAMINE **OR** diphenhydrAMINE, iohexol, LORazepam, LORazepam, MUSCLE RUB, ondansetron **OR** ondansetron (ZOFRAN) IV, ondansetron, polyethylene glycol  Palliative Performance Scale: 20%     Vital Signs: BP 135/85 mmHg  Pulse 98  Temp(Src) 97.8 F (36.6 C) (Oral)  Resp 18  Ht '6\' 2"'$  (1.88 m)  Wt 73.528 kg (162 lb 1.6 oz)  BMI 20.80 kg/m2  SpO2 98% SpO2: SpO2: 98 % O2 Device: O2 Device: Nasal Cannula O2 Flow Rate: O2 Flow Rate (L/min): 2 L/min  Intake/output summary:   Intake/Output Summary (Last 24 hours) at 04/27/15 0902 Last data filed at 04/27/15 0644  Gross per 24 hour  Intake      0 ml  Output   1100 ml  Net  -1100 ml   LBM: Last BM Date: 04/26/15 (after suppository) Baseline Weight: Weight:  75.1 kg (165 lb 9.1 oz) Most recent weight: Weight: 73.528 kg (162 lb 1.6 oz)  Physical Exam:  General:  ill appearing, pale  HEENT: dry  buccal membranes, no exudate CVS: RRR Resp: CTA Neuro: alert but lethargic and having difficulty concentrating  Additional Data Reviewed: Recent Labs     04/26/15  0454  WBC  10.7*  HGB  8.1*  PLT  104*  NA  138  BUN  22*  CREATININE  0.48*     Problem List:  Patient Active Problem List   Diagnosis Date Noted  . Bone metastases   . Encounter for palliative care   . Absolute anemia   . Chronic diastolic heart failure 40/98/1191  . Essential hypertension 04/21/2015  . Cancer related pain 04/20/2015  . Failure to thrive in adult 04/20/2015  . Diabetes mellitus type 2, controlled 04/20/2015  . Pain 04/20/2015  . Pain, cancer 04/20/2015  . Moderate malnutrition 04/20/2015  . DNR (do not resuscitate)   . Hypercalcemia 04/07/2015  . Respiratory failure 04/07/2015  . Thrombocytopenia 04/07/2015  . Respiratory failure with hypoxia 04/07/2015  . Anemia 04/07/2015  . Encounter for antineoplastic chemotherapy 03/02/2015  . Pancytopenia due to antineoplastic chemotherapy 01/17/2015  . Hyperphosphatemia 12/30/2014  . Transaminitis 12/30/2014  . Hypomagnesemia 12/30/2014  . Nausea with vomiting 12/17/2014  . Dehydration 12/17/2014  . Weakness 12/17/2014  . Hypoalbuminemia 12/17/2014  . Antineoplastic chemotherapy induced pancytopenia 12/17/2014  . Protein-calorie malnutrition, severe 11/28/2014  . Uncontrolled pain 11/26/2014  .  Palliative care encounter   . CN (constipation)   . Loss of appetite   . Insomnia   . Hypercalcemia of malignancy 11/23/2014  . Cancer associated pain 11/23/2014  . 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 Bronchitis 08/23/2014  . Mucositis due to antineoplastic therapy 05/10/2014  . Peripheral edema 05/10/2014  . Small cell lung cancer 03/03/2014  . Mass of lower lobe of left lung 02/01/2014  .  Hypertension 08/09/2012  . Hyperlipidemia 08/09/2012  . Diabetes mellitus 08/09/2012  . Overweight(278.02) 08/09/2012  . Nicotine addiction 08/09/2012  . Reactive airway disease 08/09/2012     Palliative Care Assessment & Plan    Code Status:  DNR  Goals of Care: - focus is comfort, quality and dignity  3. Symptom Management:  Bowel regiment: Dulcolax supp   Pain: DC oral agents/ Convert oral agent equivalent  to an IV Morphine gtt,            Initiate base rate at '3mg'$ /hr with bolus order, for a maximum rate of 15 mg/hr  Anxiety: Ativan 1 IV every 6 hrs scheduled    Prognosis:  Likely less than 2 weeks/  With greater pain control needs, little po intake, focus being comfort patient will benefit from hospice facility  5. Discharge Planning:  Hopeful for hospice facility, will write for choice   Care plan was discussed with Dr Tyrell Antonio and Cristal Deer LCSW  Thank you for allowing the Palliative Medicine Team to assist in the care of this patient.   Time In:  0800 Time Out: 0835 Total Time 35 minutes min Prolonged Time Billed  no     Greater than 50%  of this time was spent counseling and coordinating care related to the above assessment and plan.     Knox Royalty, NP  04/27/2015, 9:02 AM  Please contact Palliative Medicine Team phone at [phone] for questions and concerns.

## 2015-04-27 NOTE — Progress Notes (Signed)
PROGRESS NOTE  CLAUDIA ALVIZO UEA:[id number] DOB: [birth year] DOA: 04/19/2015 PCP: [name], MD  HPI/Recap of past 57 hours: 54 year old male with past medical history of stage IV small cell lung cancer on chemotherapy admitted on 8/25 for uncontrolled pain. Palliative care has been consulted.   Medications adjusted and pain initially better. Patient changed over to by mouth medications only on 8/26.  On 8/27 up in more severe pain beginning to increases in both oral medications. Initially this helped, but then patient woke up in more severe pain on same aspect of the left leg and side. Very anxious.  Scheduled benzodiazepine's plus IV Dilaudid when necessary added.  benzo change to PRN.  Palliative to meet with sister  to discussed goals of care 8-31. Plan is to transition to full comfort care. Awaiting residential hospice placement.  Patient is more alert. Still complaining of significant pain.   Assessment/Plan: Small cell lung cancer with secondary pain uncontrolled: No evidence of occult fracture seen on CT. palliative care assisting. Palliative met with family. Plan is to transition to full comfort care.  Started on IV morphine Gtt/ still with significant pain. Plan to increase doses . Appreciate Wadie Lessen help/   Anemia:secondary to chronic disease plus chemotherapy.   Hypokalemia; replaced.   Diabetes mellitus type 2, controlled: CBGs under 200. SSI.  DNR (do not resuscitate) Moderate malnutrition: Patient meets criteria in the context of chronic illness. Seen by nutrition. Continued on ensure enlive twice a day Chronic diastolic heart failure: hold lasix. Not eating much,.  Constipation secondary to opiates:  Fleet enema, suppository.  Essential hypertension: Blood pressure, likely secondary to pain   Code Status: DO NOT RESUSCITATE  Family Communication: none at bedside.   Disposition Plan: awaiting palliative care meeting with family.    Consultants:    palliative care  Oncology  Procedures:  None  Antibiotics:  None   Objective: BP 135/85 mmHg  Pulse 98  Temp(Src) 97.8 F (36.6 C) (Oral)  Resp 18  Ht 6' 2"  (1.88 m)  Wt 73.528 kg (162 lb 1.6 oz)  BMI 20.80 kg/m2  SpO2 98%  Intake/Output Summary (Last 24 hours) at 04/27/15 1437 Last data filed at 04/27/15 1342  Gross per 24 hour  Intake     30 ml  Output   1300 ml  Net  -1270 ml   Filed Weights   04/24/15 0541 04/25/15 0702 04/25/15 2044  Weight: 73.3 kg (161 lb 9.6 oz) 73.6 kg (162 lb 4.1 oz) 73.528 kg (162 lb 1.6 oz)    Exam:   General: more alert today  Cardiovascular: regular rate and rhythm, S1-S2, borderline tachycardia  Respiratory: clear to auscultation bilaterally, moderate inspiratory effort  Abdomen: soft, nontender, distended, hypoactive bowel sounds Musculoskeletal: no clubbing or cyanosis or edema  Data Reviewed: Basic Metabolic Panel:  Recent Labs Lab 04/22/15 0910 04/26/15 0454  NA 134* 138  K 3.3* 3.4*  CL 98* 94*  CO2 26 31  GLUCOSE 107* 161*  BUN 12 22*  CREATININE 0.45* 0.48*  CALCIUM 9.6 9.9   Liver Function Tests: No results for input(s): AST, ALT, ALKPHOS, BILITOT, PROT, ALBUMIN in the last 168 hours. No results for input(s): LIPASE, AMYLASE in the last 168 hours. No results for input(s): AMMONIA in the last 168 hours. CBC:  Recent Labs Lab 04/21/15 1020 04/26/15 0454  WBC 11.0* 10.7*  HGB 8.8* 8.1*  HCT 27.3* 24.6*  MCV 95.5 95.0  PLT 145* 104*   Cardiac Enzymes:  No results for input(s): CKTOTAL, CKMB, CKMBINDEX, TROPONINI in the last 168 hours. BNP (last 3 results)  Recent Labs  04/07/15 1033 04/21/15 1020  BNP 109.4* 119.5*    ProBNP (last 3 results) No results for input(s): PROBNP in the last 8760 hours.  CBG:  Recent Labs Lab 04/26/15 0751 04/26/15 1218 04/26/15 1640 04/26/15 2108 04/27/15 0751  GLUCAP 137* 129* 178* 391* 324*    No results found for this or any previous visit (from  the past 240 hour(s)).   Studies: No results found.  Scheduled Meds: . albuterol  2.5 mg Nebulization TID  . dexamethasone  4 mg Oral Daily  . feeding supplement (ENSURE ENLIVE)  237 mL Oral BID BM  . LORazepam  1 mg Intravenous Q6H  . mometasone-formoterol  2 puff Inhalation BID  . senna-docusate  2 tablet Oral BID    Continuous Infusions: . morphine 7.5 mg/hr (04/27/15 1351)     Time spent: 15 minutes  Bertrice Leder, [place] [facility] Pager [phone]. [id number]-7AM, please contact night-coverage at [email or web address], password [email or web address]9/08/2014, 2:37 PM  LOS: 7 days

## 2015-04-27 NOTE — Progress Notes (Signed)
CSW received referral for residential hospice placement.   CSW met with pt at bedside.   CSW offered choice for residential hospice facilities. Pt chooses [facility].   CSW clarified pt questions surrounding referral to [facility].  CSW contacted [employer], Erling Conte and made referral to [facility]. [facility] processing referral and will notify CSW of availability.  CSW to continue to follow to provide support and assist with disposition to residential hospice.  Alison Murray, MSW, [place] Work [phone]

## 2015-04-27 NOTE — Progress Notes (Signed)
Nutrition Brief Note  Chart reviewed. Pt now transitioning to comfort care.  No further nutrition interventions warranted at this time.  Please consult as needed.   Clayton Bibles, MS, RD, LDN Pager: [phone] After Hours Pager: [phone]

## 2015-04-27 NOTE — Progress Notes (Signed)
Inpatient Diabetes Program Recommendations  AACE/ADA: New Consensus Statement on Inpatient Glycemic Control (2013)  Target Ranges:  Prepandial:   less than 140 mg/dL      Peak postprandial:   less than 180 mg/dL (1-2 hours)      Critically ill patients:  140 - 180 mg/dL   Reason for Visit: Hyperglycemia  Results for GREYDEN, BESECKER (MRN [id number]) as of 04/27/2015 10:05  Ref. Range 04/26/2015 21:08 04/27/2015 07:51  Glucose-Capillary Latest Ref Range: 65-99 mg/dL 391 (H) 324 (H)   Consider addition of Lantus 5 units QHS (has been on Lantus 10 units QHS in the past) Add HS correcton.  Will continue to follow. Thank you. Lorenda Peck, RD, LDN, CDE Inpatient Diabetes Coordinator [phone]

## 2015-04-27 NOTE — Progress Notes (Signed)
°  Notified by [place] of family request for [facility]osp[facility] and [facility] services at home after discharge. Chart and patient Information currently under review to confirm hospice eligibility.   Spoke with [name], at bedside to initiate education related to hospice philosophy, services and team approach to care. Family verbalized understanding of the information provided. Per discussion plan is for discharge to home by [facility] in the morning.  Please send signed completed DNR form home with patient.  Patient will need prescriptions for discharge comfort medications.  Patient will need PICC placed before discharge for morphine drip.  DME needs discussed and family denied needs.  [facility] aware of the above.  Completed discharge summary will need to be faxed to [facility] at [phone] when final.   Please notify [facility] when patient is ready to leave unit at discharge-call [phone].  [facility] information and contact numbers have been given to [place] during visit.   Above information shared with Alinda Sierras [facility].  Please call with any questions.   Annia Belt RN, [place] [facility] Liaison  [phone]

## 2015-04-28 ENCOUNTER — Other Ambulatory Visit: Payer: Federal, State, Local not specified - PPO

## 2015-04-28 ENCOUNTER — Ambulatory Visit: Payer: Federal, State, Local not specified - PPO

## 2015-04-28 ENCOUNTER — Ambulatory Visit: Payer: Federal, State, Local not specified - PPO | Admitting: Physician Assistant

## 2015-04-28 DIAGNOSIS — C7951 Secondary malignant neoplasm of bone: Secondary | ICD-10-CM

## 2015-04-28 MED ORDER — LORAZEPAM 2 MG/ML IJ SOLN: 1.0000 mg | Freq: Four times a day (QID) | INTRAMUSCULAR | Status: DC

## 2015-04-28 MED ORDER — MORPHINE SULFATE 25 MG/ML IV SOLN: 3.0000 mg/h | INTRAVENOUS | Status: AC

## 2015-04-28 MED ORDER — SODIUM CHLORIDE 0.9 % IJ SOLN: 10.0000 mL | Freq: Two times a day (BID) | INTRAMUSCULAR | Status: DC

## 2015-04-28 MED ORDER — SODIUM CHLORIDE 0.9 % IJ SOLN: 10.0000 mL | INTRAMUSCULAR | Status: DC | PRN

## 2015-04-28 MED ORDER — DEXAMETHASONE 4 MG PO TABS: 4.0000 mg | ORAL_TABLET | Freq: Every day | ORAL | Status: AC

## 2015-04-28 MED ORDER — BENZOCAINE 10 % MT GEL: 1.0000 "application " | Freq: Three times a day (TID) | OROMUCOSAL | Status: AC | PRN

## 2015-04-28 MED ORDER — LORAZEPAM 1 MG PO TABS: 1.0000 mg | ORAL_TABLET | Freq: Three times a day (TID) | ORAL | Status: AC

## 2015-04-28 MED ORDER — BISACODYL 10 MG RE SUPP: 10.0000 mg | Freq: Two times a day (BID) | RECTAL | Status: AC | PRN

## 2015-04-28 MED ORDER — DIPHENHYDRAMINE HCL 50 MG/ML IJ SOLN: 25.0000 mg | Freq: Four times a day (QID) | INTRAMUSCULAR | Status: AC | PRN

## 2015-04-28 NOTE — Progress Notes (Signed)
CSW received notification that pt and pt sister decided on returning home with hospice rather than discharging to [facility].   Pt for discharge home with hospice with pt sister support today.  CSW received notification that pt needing ambulance transport home.   CSW confirmed address with pt sister.   CSW arranged ambulance transport for pt to home. Ambulance form and DNR provided.  No further social work needs identified at this time.  CSW signing off.   Alison Murray, MSW, [place] Work [phone]

## 2015-04-28 NOTE — Progress Notes (Signed)
Bladder scanned, noted 13 ml, patient was incontinent of urine this am. Md aware will hold off the on the foley per MD. Morphine drip bolus 0.5 mg given at this time. Will continue to monitor.

## 2015-04-28 NOTE — Progress Notes (Signed)
Peripherally Inserted Central Catheter/Midline Placement  The IV Nurse has discussed with the patient and/or persons authorized to consent for the patient, the purpose of this procedure and the potential benefits and risks involved with this procedure.  The benefits include less needle sticks, lab draws from the catheter and patient may be discharged home with the catheter.  Risks include, but not limited to, infection, bleeding, blood clot (thrombus formation), and puncture of an artery; nerve damage and irregular heat beat.  Alternatives to this procedure were also discussed.  PICC/Midline Placement Documentation  PICC / Midline Single Lumen 04/28/15 PICC Right Brachial 43 cm 2 cm (Active)  Indication for Insertion or Continuance of Line Home intravenous therapies (PICC only) 04/28/2015 12:00 PM  Exposed Catheter (cm) 2 cm 04/28/2015 12:00 PM  Dressing Change Due 05/05/15 04/28/2015 12:00 PM       Jule Economy Horton 04/28/2015, 12:21 PM

## 2015-04-28 NOTE — Progress Notes (Signed)
Picc line to be placed today for morphine pump at home. This CM faxed morphine drip prescription to infusion pharmacy for [facility] [phone]. [facility] RN to meet pt at home to start drip.  Pt to DC home via [facility]. RN aware to have [facility] take pt home. No other CM needs. Marney Doctor RN,BSN,NCM  [phone]

## 2015-04-28 NOTE — Progress Notes (Signed)
Wasted 150 cc of morphine in 250 cc normal saline in sink,witnessed by Laural Benes

## 2015-04-28 NOTE — Discharge Summary (Signed)
Physician Discharge Summary  NEILSON OEHLERT BPZ:[id number] DOB: [birth year] DOA: 04/19/2015  PCP: [name], MD  Admit date: 04/19/2015 Discharge date: 04/28/2015  Time spent: 35 minutes  Recommendations for Outpatient Follow-up:  1. Follow up with hospice for comfort care  Discharge Diagnoses:    Cancer related pain   Small cell lung cancer   Weakness   Anemia   Failure to thrive in adult   Diabetes mellitus type 2, controlled   Pain   Pain, cancer   DNR (do not resuscitate)   Moderate malnutrition   Chronic diastolic heart failure   Essential hypertension   Bone metastases   Encounter for palliative care   Absolute anemia   Discharge Condition: Guarded, poor prognosis  Diet recommendation: comfort feeding.   Filed Weights   04/24/15 0541 04/25/15 0702 04/25/15 2044  Weight: 73.3 kg (161 lb 9.6 oz) 73.6 kg (162 lb 4.1 oz) 73.528 kg (162 lb 1.6 oz)    History of present illness:  OLSEN Peck is a 54 y.o. male with history of extensive stage small cell lung cancer was recently admitted 2 weeks ago for shortness of breath multifactorial in cause and anemia and hypercalcemia presents to the ER because of increasing pain mostly in the abdomen area and pelvic area. Patient states he also has been feeling increasingly weak with poor appetite. He had vomited once yesterday in the ER. Denies any chest pain or shortness of breath. Denies any diarrhea. Since patient has significant pain patient has been admitted for further pain management. Patient has chronic shortness of breath which has been the patient has not improved. Patient is on chemotherapy and has not received one last week.  Hospital Course:  54 year old male with past medical history of stage IV small cell lung cancer on chemotherapy admitted on 8/25 for uncontrolled pain. Palliative care has been consulted. Medications adjusted and pain initially better. Patient changed over to by mouth medications only on  8/26. On 8/27 up in more severe pain beginning to increases in both oral medications. Initially this helped, but then patient woke up in more severe pain on same aspect of the left leg and side. Very anxious. Scheduled benzodiazepine's plus IV Dilaudid when necessary added.  benzo change to PRN.  Palliative to meet with sister to discussed goals of care 8-31. Plan is to transition to full comfort care. Awaiting residential hospice placement.  Patient is more alert. Still complaining of significant pain.   Assessment/Plan: Small cell lung cancer with secondary pain uncontrolled: No evidence of occult fracture seen on CT. palliative care assisting. Palliative met with family. Plan is to transition to full comfort care.  Started on IV morphine Gtt/ increased morphine as needed. . Plan to increase doses . Humphreys help/  Discharge home today, patient is full comfort care.   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   Hypokalemia; replaced.   Diabetes mellitus type 2, controlled: CBGs under 200. SSI.  DNR (do not resuscitate) Moderate malnutrition: Patient meets criteria in the context of chronic illness. Seen by nutrition. Continued on ensure enlive twice a day Chronic diastolic heart failure: hold lasix. Not eating much,.  Constipation secondary to opiates: Fleet enema, suppository.  Essential hypertension: Blood pressure, likely secondary to pain  Procedures: none Consultations:  Oncology  Palliative care  Discharge Exam: Filed Vitals:   04/28/15 0447  BP: 138/89  Pulse: 105  Temp: 97.7 F (36.5 C)  Resp: 18    General: NAD Cardiovascular: S 1,  S 2 RRR Respiratory: Bilateral  ronchus  Discharge Instructions   Discharge Instructions    Diet general    Complete by:  As directed           Current Discharge Medication List    START taking these medications   Details  bisacodyl (DULCOLAX) 10 MG suppository Place 1 suppository  (10 mg total) rectally 2 (two) times daily as needed for moderate constipation. Qty: 12 suppository, Refills: 0    dexamethasone (DECADRON) 4 MG tablet Take 1 tablet (4 mg total) by mouth daily. Qty: 30 tablet, Refills: 0    diphenhydrAMINE (BENADRYL) 50 MG/ML injection Inject 0.5 mLs (25 mg total) into the muscle every 6 (six) hours as needed for itching. Qty: 10 mL, Refills: 0    LORazepam (ATIVAN) 2 MG/ML injection Inject 0.5 mLs (1 mg total) into the vein every 6 (six) hours. Qty: 1 mL, Refills: 0    morphine 250 mg in dextrose 5 % 250 mL Inject 3 mg/hr into the vein continuous. Qty: 240 mg, Refills: 0      CONTINUE these medications which have CHANGED   Details  benzocaine (ORAJEL) 10 % mucosal gel Use as directed 1 application in the mouth or throat 3 (three) times daily as needed for mouth pain (mouth pain). Qty: 5.3 g, Refills: 0      CONTINUE these medications which have NOT CHANGED   Details  albuterol (PROVENTIL HFA;VENTOLIN HFA) 108 (90 BASE) MCG/ACT inhaler Inhale 2 puffs into the lungs every 6 (six) hours as needed for wheezing. Qty: 1 Inhaler, Refills: 0   Associated Diagnoses: Acute bronchospasm    alum & mag hydroxide-simeth (MAALOX/MYLANTA) 200-200-20 MG/5ML suspension Take 30 mLs by mouth every 6 (six) hours as needed for indigestion or heartburn (indigestion).    emollient (BIAFINE) cream Apply topically as needed. Qty: 454 g, Refills: 0    Fluticasone-Salmeterol (ADVAIR) 250-50 MCG/DOSE AEPB Inhale 2 puffs into the lungs 2 (two) times daily as needed (wheezing).     ondansetron (ZOFRAN) 8 MG tablet Take 1 tablet (8 mg total) by mouth every 8 (eight) hours as needed for nausea or vomiting (nause and vomiting). Disp. 20 tablets. 1 refill.  Called in to [facility] per Dr. Sondra Come on 11/24/14. Qty: 20 tablet, Refills: 0   Associated Diagnoses: Small cell lung cancer, unspecified laterality    pantoprazole (PROTONIX) 40 MG tablet Take 1 tablet (40 mg total) by  mouth daily. Qty: 20 tablet, Refills: 0   Associated Diagnoses: Small cell lung cancer, unspecified laterality    senna (SENOKOT) 8.6 MG TABS tablet TAKE ONE TABLET BY MOUTH TWICE DAILY Qty: 120 tablet, Refills: 0    Insulin Pen Needle (PEN NEEDLES 3/16") 31G X 5 MM MISC Use daily with your Lantus injection Qty: 90 each, Refills: 0      STOP taking these medications     bismuth subsalicylate (PEPTO BISMOL) 262 MG/15ML suspension      MAGNESIUM-OXIDE 400 (241.3 MG) MG tablet      metFORMIN (GLUCOPHAGE) 1000 MG tablet      Oxycodone HCl 10 MG TABS      OxyCODONE HCl ER 60 MG T12A      polyethylene glycol (MIRALAX / GLYCOLAX) packet      pyridOXINE (VITAMIN B-6) 100 MG tablet      vitamin C (ASCORBIC ACID) 500 MG tablet      furosemide (LASIX) 20 MG tablet      levofloxacin (LEVAQUIN) 500 MG tablet  ONE TOUCH ULTRA TEST test strip      potassium chloride SA (K-DUR,KLOR-CON) 20 MEQ tablet      sucralfate (CARAFATE) 1 GM/10ML suspension        No Known Allergies    The results of significant diagnostics from this hospitalization (including imaging, microbiology, ancillary and laboratory) are listed below for reference.    Significant Diagnostic Studies: Ct Angio Chest Pe W/cm &/or Wo Cm  04/07/2015   CLINICAL DATA:  Expand All Collapse All Pt brought over from cancer center for [facility], which are questioning PE. Pt has been vomiting over the past 3-4 days, but states "not much diarrhea". Pt is getting chemo for lung cancer. Pt placed on O2 at Cancer center but not on continuous, Lung ca dx 01/2014 with chemo ongoing and xrt completed 09/2014, Surgery-none  EXAM: CT ANGIOGRAPHY CHEST WITH CONTRAST  TECHNIQUE: Multidetector CT imaging of the chest was performed using the standard protocol during bolus administration of intravenous contrast. Multiplanar CT image reconstructions and MIPs were obtained to evaluate the vascular anatomy.  CONTRAST:  170m OMNIPAQUE IOHEXOL 350  MG/ML SOLN  COMPARISON:  Chest radiograph, 04/07/2015.  Chest CT, 03/01/2015.  FINDINGS: Angiographic exam: Study somewhat limited by respiratory motion. Allowing this, there is no evidence of a pulmonary embolus. Thoracic aorta is normal in caliber. Minor calcified plaque along the aortic arch.  Thoracic inlet: No mass or enlarged lymph node. Thyroid unremarkable.  Mediastinum and hila: Heart is normal in size. There are dense coronary artery calcifications. No mediastinal mass or enlarged lymph node. There is increased soft tissue along the left hilar structures without a defined mass or adenopathy. Right hilum is unremarkable.  Lungs and pleura: Small left pleural effusion. There is ground-glass opacity surrounding the left hilum in the left upper lobe and left lower lobe medially mostly in the superior segment. This is likely radiation induced inflammation/scarring. There is opacity along the pleural margin of the left upper lobe anteriorly, also the level of the hilum which may reflect radiation induced change. Metastatic disease is possible. The most nodular area this measures 10 mm in size. There is a lobulated nodule with some spiculation to its margins in the left lower lobe, image 55, series 7, measuring 16.6 x 13.4 mm. Is similar to the prior CT. Just below this along the peripheral margin of the left lower lobe is a 13 mm irregular focal opacity, pleural based, not evident on the prior CT. This could reflect atelectasis. Neoplastic disease is possible but given the rapid change, is felt unlikely. There is another area of more ill-defined focal airspace consolidation in the left lower lobe, image 64, measuring 2.6 x 1.5 cm. It lies peripheral to an area of opacity along the inferior left hilum. There are additional linear and coarse reticular opacities in the left upper lobe which may reflect atelectasis. There is some mild reticular opacity in the medial anterior right middle lobe and medial right lower  lobe, likely atelectasis or post radiation change. No discrete right lung mass or nodule.  Limited upper abdomen:  Unremarkable.  Musculoskeletal: Extensive bony metastatic disease. Multiple pathologic rib fractures and vertebral fractures. Findings are similar to the prior CT.  Review of the MIP images confirms the above findings.  IMPRESSION: 1. No evidence of a pulmonary embolus. 2. Small left pleural effusion. Areas of left lung opacity has developed since prior CT, likely due to radiation induced inflammation/fibrosis. Primary left lower lobe mass is stable in size. Second masslike opacity in  the posterior peripheral left lower lobe is new, but likely atelectasis or inflammatory in etiology. Small left pleural effusion. 3. No evidence of metastatic disease to the right lung. 4. Extensive bony metastatic disease in multiple pathologic fractures, stable.   Electronically Signed   By: Lajean Manes M.D.   On: 04/07/2015 12:00   Ct Hip Left W Contrast  04/23/2015   CLINICAL DATA:  Widespread metastatic lung cancer with severe left hip pain. Chemotherapy in progress. Radiation therapy completed 6 months ago.  EXAM: CT OF THE LEFT HIP WITH CONTRAST  TECHNIQUE: Multidetector CT imaging was performed following the standard protocol during bolus administration of intravenous contrast.  CONTRAST:  1 OMNIPAQUE IOHEXOL 300 MG/ML SOLN, 170m OMNIPAQUE IOHEXOL 300 MG/ML SOLN  COMPARISON:  Pelvic x-ray 04/20/2015. Abdominal pelvic CT 03/01/2015.  FINDINGS: The present examination was performed as a CT of the left hip which is a small field-of-view study isolated to the left hip joint and lower left hemipelvis. The entire pelvis is not imaged.  Widespread lytic metastatic disease is again noted with involvement of the sacrum, ilium, pubis, ischium and proximal femur. The disease appears mildly progressive compared with the CT of 7 weeks ago, especially in the intertrochanteric region of the femur. No pathologic fracture is  apparent in this area. There is severe involvement of the visualized sacrum which is nearly completely destroyed on the left. Patient is certainly at risk for a pathologic fracture of the sacrum, not clearly demonstrated by this small field-of-view examination.  No enlarged lymph nodes or soft tissue mass is identified within the lower left pelvis. There is some perirectal edema. Scattered vascular calcifications are noted. There is a small left hip joint effusion with synovial enhancement.  IMPRESSION: 1. Widespread metastatic disease throughout the visualized bones in the left hip area. The disease does appear mildly progressive compared with a pelvic CT done 03/01/2015. 2. The disease is quite extensive in the sacrum, placing the patient at risk for pathologic fracture. The sacrum is incompletely visualized by this small field-of-view study of the left hip. No evidence of pathologic fracture in the proximal femur. 3. Small left hip joint effusion.   Electronically Signed   By: WRichardean SaleM.D.   On: 04/23/2015 14:54   Dg Pelvis Portable  04/20/2015   CLINICAL DATA:  Posterior pelvic pain and tailbone pain this morning.  EXAM: PORTABLE PELVIS 1-2 VIEWS  COMPARISON:  CT of the abdomen and pelvis 03/01/2015  FINDINGS: Multiple lytic lesions are identified throughout the pelvis but partially obscured by bowel gas. Significant metastatic disease was previous identified in the iliac bones and sacrum on previous CT exam. Lytic lesions are also identified in the proximal femurs. No acute displaced fractures are identified. Consider further evaluation with CT for direct comparison and to determine presence of possible pathologic fractures given the presence of pain.  IMPRESSION: 1. Osseous metastases.  No obvious displaced fractures identified. 2. Consider CT for further evaluation.   Electronically Signed   By: ENolon NationsM.D.   On: 04/20/2015 09:18   Dg Chest Portable 1 View  04/20/2015   CLINICAL DATA:   Cough with yellow mucus production. Shortness of breath. History of metastatic lung cancer.  EXAM: PORTABLE CHEST - 1 VIEW  COMPARISON:  Most recent chest imaging radiographs and CT 04/07/2015  FINDINGS: Cardiomediastinal contours are unchanged with stable left hilar prominence. Scarring or atelectasis in the left midlung zone with underlying nodularity, unchanged. No consolidation to suggest pneumonia. The small left  pleural effusion on prior CT is not seen radiographically. No pneumothorax. Callus formation about multiple right-sided rib fractures. Left rib fractures that appear pathologic. Probable metastatic involvement of the left glenoid.  IMPRESSION: 1. No evidence of acute process or pneumonia. 2. Post treatment related change in the left hemithorax. Atelectasis/scarring and nodularity in the left midlung zone. There are bilateral rib fractures that appear pathologic, callus formation about the right-sided fractures, characterized previously on CT.   Electronically Signed   By: Jeb Levering M.D.   On: 04/20/2015 01:22   Dg Chest Portable 1 View  04/07/2015   CLINICAL DATA:  New onset shortness of breath and RIGHT-sided chest pain this morning, history metastatic lung cancer on chemotherapy, hypertension  EXAM: PORTABLE CHEST - 1 VIEW  COMPARISON:  Portable exam 1105 hours compared to 02/15/2015 and correlated with CT chest of 03/01/2015  FINDINGS: Normal heart size.  New LEFT hilar enlargement/adenopathy versus perihilar mass or post radiation therapy change.  Subsegmental atelectasis at LEFT mid lung.  Underlying emphysematous changes.  Remaining lungs clear.  No pleural effusion or pneumothorax.  Probable old avulsion fracture off the inferior LEFT glenoid.  IMPRESSION: New abnormal appearance of the LEFT pulmonary hilum question adenopathy versus perihilar mass or sequela of prior radiation therapy.  Appearance is new since 03/01/2015.  Subsegmental atelectasis at LEFT mid lung, new  This could be  better assessed by CT chest with contrast.   Electronically Signed   By: Lavonia Dana M.D.   On: 04/07/2015 11:22   Dg Abd Portable 1v  04/20/2015   CLINICAL DATA:  Abdominal pain and distention. History of metastatic lung carcinoma.  EXAM: PORTABLE ABDOMEN - 1 VIEW  COMPARISON:  CT of the abdomen and pelvis on 03/01/2015  FINDINGS: No evidence of bowel obstruction or significant ileus. No abnormal calcifications or soft tissue abnormalities identified. Visualized bony structures show loss of height of multiple vertebral bodies. Patient has known bony metastatic disease.  IMPRESSION: No acute findings. Loss of height of multiple vertebral bodies due to metastatic disease.   Electronically Signed   By: Aletta Edouard M.D.   On: 04/20/2015 09:25    Microbiology: No results found for this or any previous visit (from the past 240 hour(s)).   Labs: Basic Metabolic Panel:  Recent Labs Lab 04/22/15 0910 04/26/15 0454  NA 134* 138  K 3.3* 3.4*  CL 98* 94*  CO2 26 31  GLUCOSE 107* 161*  BUN 12 22*  CREATININE 0.45* 0.48*  CALCIUM 9.6 9.9   Liver Function Tests: No results for input(s): AST, ALT, ALKPHOS, BILITOT, PROT, ALBUMIN in the last 168 hours. No results for input(s): LIPASE, AMYLASE in the last 168 hours. No results for input(s): AMMONIA in the last 168 hours. CBC:  Recent Labs Lab 04/21/15 1020 04/26/15 0454  WBC 11.0* 10.7*  HGB 8.8* 8.1*  HCT 27.3* 24.6*  MCV 95.5 95.0  PLT 145* 104*   Cardiac Enzymes: No results for input(s): CKTOTAL, CKMB, CKMBINDEX, TROPONINI in the last 168 hours. BNP: BNP (last 3 results)  Recent Labs  04/07/15 1033 04/21/15 1020  BNP 109.4* 119.5*    ProBNP (last 3 results) No results for input(s): PROBNP in the last 8760 hours.  CBG:  Recent Labs Lab 04/26/15 0751 04/26/15 1218 04/26/15 1640 04/26/15 2108 04/27/15 0751  GLUCAP 137* 129* 178* 391* 324*       Signed:  Jaanvi Fizer A  [facility] 04/28/2015,  10:18 AM

## 2015-05-03 ENCOUNTER — Telehealth: Payer: Self-pay | Admitting: Internal Medicine

## 2015-05-03 NOTE — Telephone Encounter (Signed)
Patient sister called due to receiving a reminder call about Friday's appointments. Per sister patient passed away on 11-14-2022 05/04/2015. Voicemail message to MM desk nurse. Patient marked deceased in EPIC.

## 2015-05-04 ENCOUNTER — Telehealth: Payer: Self-pay | Admitting: Internal Medicine

## 2015-05-04 NOTE — Telephone Encounter (Signed)
Received death certificate [birth year]

## 2015-05-05 ENCOUNTER — Other Ambulatory Visit: Payer: Federal, State, Local not specified - PPO

## 2015-05-05 ENCOUNTER — Ambulatory Visit: Payer: Federal, State, Local not specified - PPO

## 2015-05-12 ENCOUNTER — Other Ambulatory Visit: Payer: Federal, State, Local not specified - PPO

## 2015-05-19 ENCOUNTER — Ambulatory Visit: Payer: Federal, State, Local not specified - PPO

## 2015-05-19 ENCOUNTER — Ambulatory Visit: Payer: Federal, State, Local not specified - PPO | Admitting: Nurse Practitioner

## 2015-05-19 ENCOUNTER — Other Ambulatory Visit: Payer: Federal, State, Local not specified - PPO

## 2015-05-26 ENCOUNTER — Ambulatory Visit: Payer: Federal, State, Local not specified - PPO

## 2015-05-26 ENCOUNTER — Other Ambulatory Visit: Payer: Federal, State, Local not specified - PPO

## 2015-05-27 DEATH — deceased

## 2016-02-12 ENCOUNTER — Other Ambulatory Visit: Payer: Self-pay | Admitting: Nurse Practitioner

## 2016-02-13 ENCOUNTER — Other Ambulatory Visit: Payer: Self-pay | Admitting: Nurse Practitioner

## 2016-06-23 IMAGING — CT CT CHEST W/ CM
2 of 5 series · 15 of 46 positions shown, 17 images · IV contrast (OMNIPAQUE)
Comparison: Multiple exams, including 11/16/2014

CLINICAL DATA: Left-sided small cell lung cancer restaging

EXAM:
CT CHEST, ABDOMEN, AND PELVIS WITH CONTRAST
TECHNIQUE: Multidetector CT imaging of the chest, abdomen and pelvis was
performed following the standard protocol during bolus
administration of intravenous contrast.
CONTRAST:  100mL OMNIPAQUE IOHEXOL 300 MG/ML  SOLN

[Series 2: cap with st · axial · 0.95mm/px · z∈[-488,+88]mm · 12 of 131 slices shown, 14 images]
[im 8/131  soft-tissue]
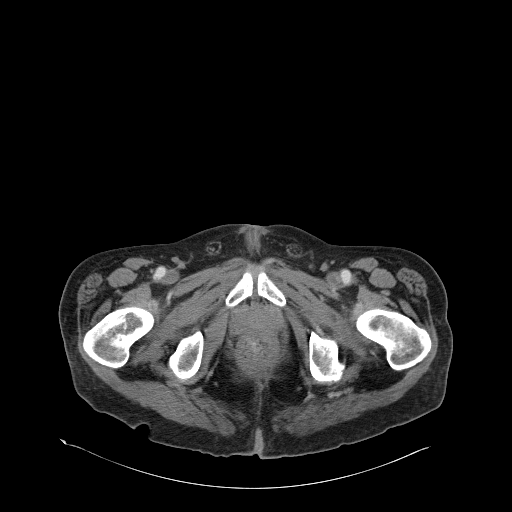
[im 8/131  bone]
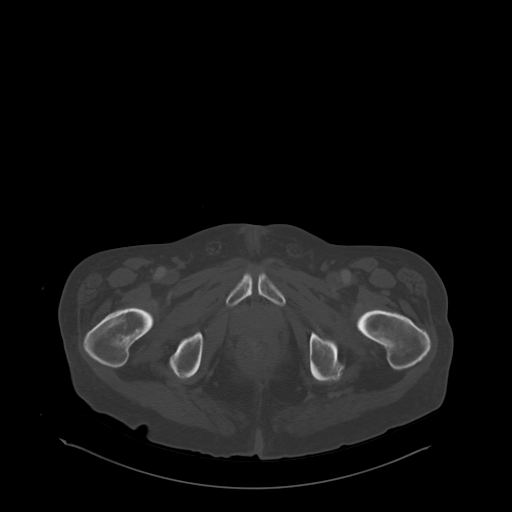
[im 23/131  soft-tissue]
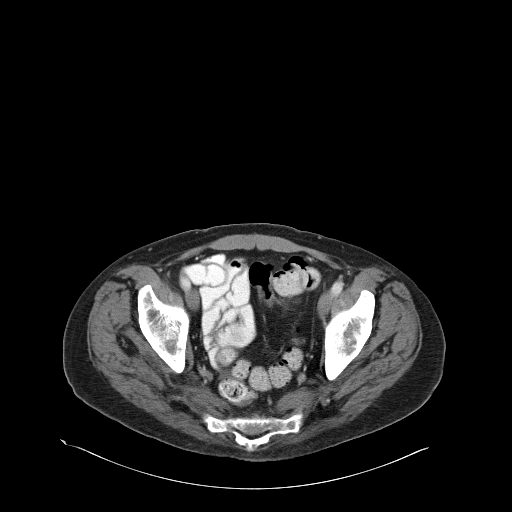
[im 31/131  soft-tissue]
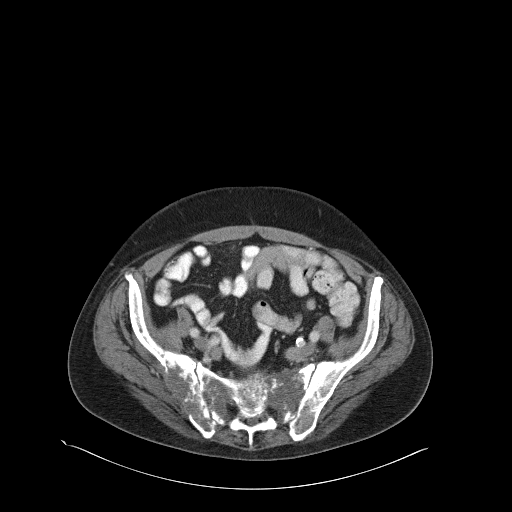
[im 39/131  soft-tissue]
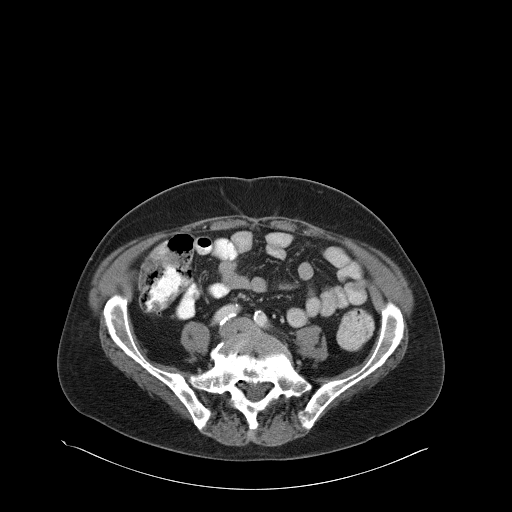
[im 54/131  soft-tissue]
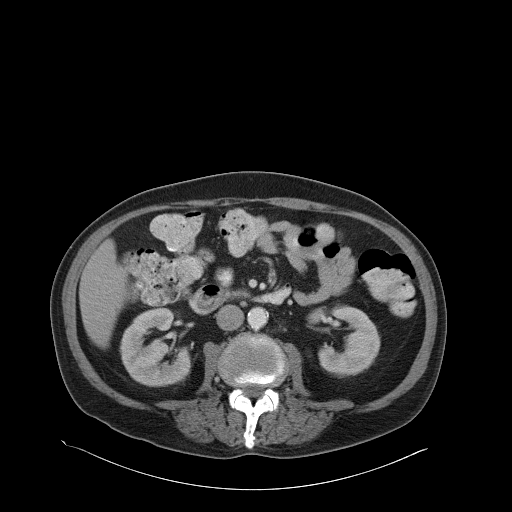
[im 62/131  soft-tissue]
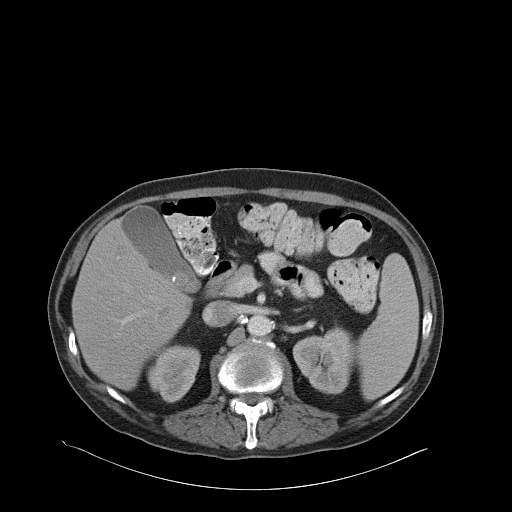
[im 69/131  soft-tissue]
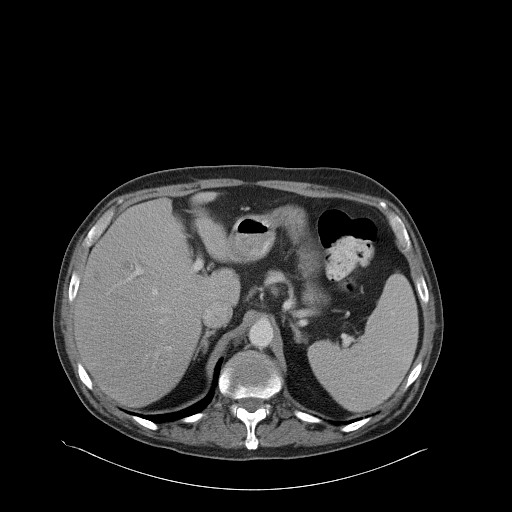
[im 85/131  soft-tissue]
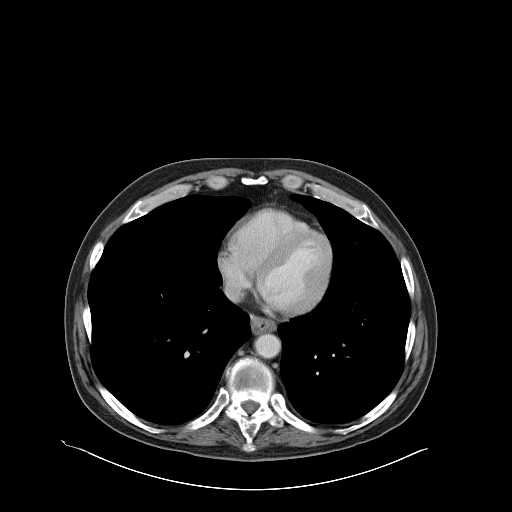
[im 92/131  soft-tissue]
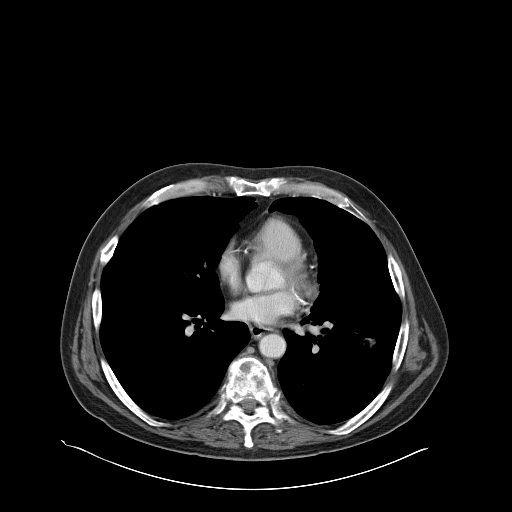
[im 92/131  bone]
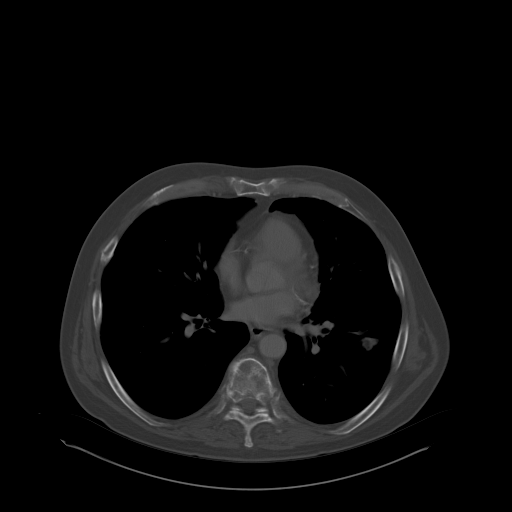
[im 100/131  soft-tissue]
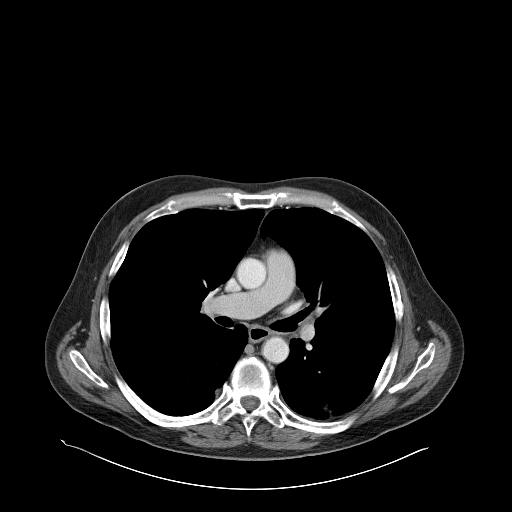
[im 115/131  soft-tissue]
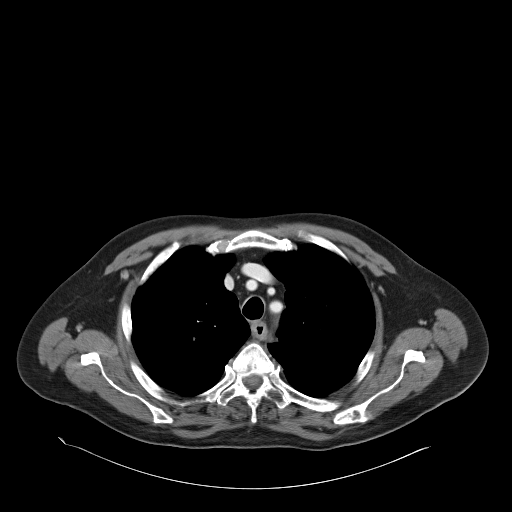
[im 123/131  soft-tissue]
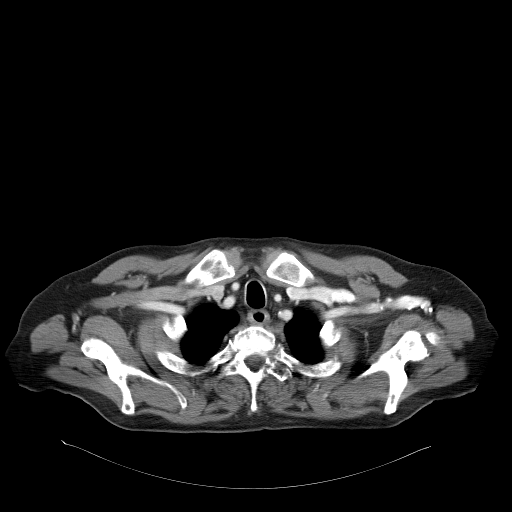

[Series 602: <mpr thick range> · coronal · 1.28mm/px · 3 of 94 slices shown]
[im 32/94  soft-tissue]
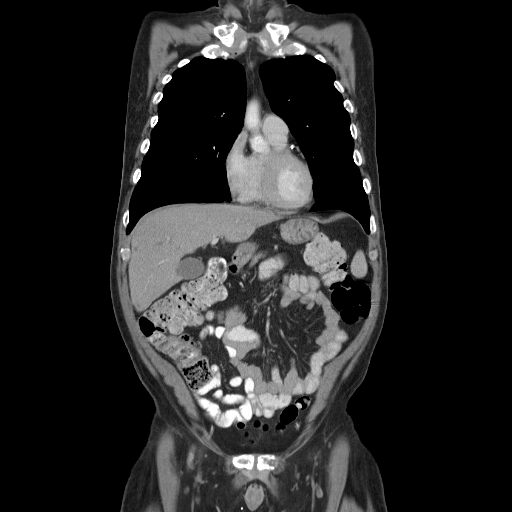
[im 42/94  soft-tissue]
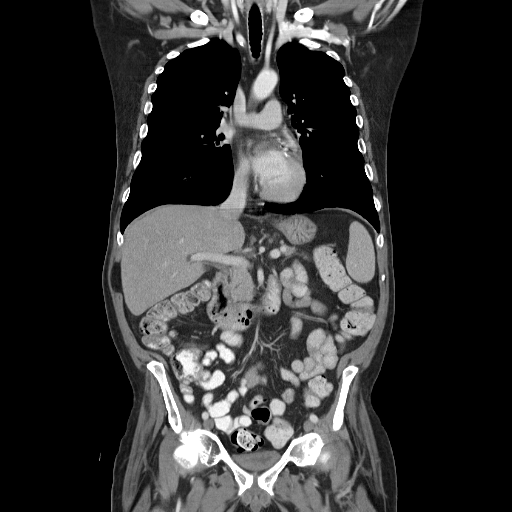
[im 52/94  soft-tissue]
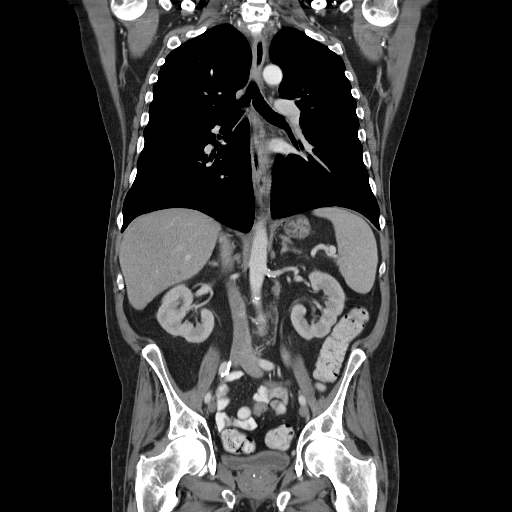

[15 of 46 positions shown; findings below may reference images not displayed]

FINDINGS: CT CHEST FINDINGS

Mediastinum/Nodes: Distal esophageal wall thickening, measuring 9 mm
in single wall thickness on image 49 series 2. Currently no
pathologic thoracic adenopathy.

Extensive coronary artery atherosclerotic calcification. Proximal
branch vessel atherosclerotic calcification noted.

Lungs/Pleura: New 0.9 by cm nodule medially in the right lower lobe,
image 38 series 4.

Left lower lobe nodule on image 40 of series 4 measures 1.7 by
cm, formerly 2.0 by 1.5 cm by my measurements. There is an
indistinct bandlike density measuring 1.8 by 1.2 by 0.7 cm in the
left lower lobe on image 32 series 4, new compared to the prior
exam.

Musculoskeletal: Extensive metastatic disease involving the proximal
humerus bilaterally, left medial clavicle, left scapular spine and
glenoid, thoracic spine, and numerous ribs noted. Some of the
previously acute fractured ribs now demonstrate periosteal reaction
indicating healing response ; there are new acute fractures of the
left anterior first rib and right lateral sixth rib which are
thought to be pathologic. Extensive thoracic spine metastatic
disease looks similar to the prior exam with multiple pathologic
fractures similar to prior, and better depicted on the thoracic
spine MRI exams.

CT ABDOMEN PELVIS FINDINGS

Hepatobiliary: Scattered hepatic metastatic lesions are generally
reduced in size compared to the prior exam. For example, a 2.1 by
1.6 cm metastatic lesion in the right hepatic lobe on image 64 of
series 2 of the 11/16/2014 exam currently measures 1.1 by 0.9 cm. No
definite new hepatic metastatic lesions. Multiple gallstones are
noted, measuring up to 1.5 cm in diameter. No biliary dilatation.

Pancreas: The previous low-density lesion in the tip of the tail the
pancreas is no longer visible. In general the pancreas is
considerably smaller, query accelerated pancreatic atrophy.

Spleen: Unremarkable

Adrenals/Urinary Tract: Anterior wall thickening in the urinary
bladder, unchanged.

Stomach/Bowel: Equivocal distal rectal wall thickening, not present
previously.

Vascular/Lymphatic: Prior porta hepatis and peripancreatic
adenopathy is considerably improved. An index 1.7 cm portacaval node
currently measures 0.9 cm in short axis. Aortoiliac atherosclerotic
vascular disease.

Reproductive: Unremarkable

Other: No supplemental non-categorized findings.

Musculoskeletal: Severe osseous metastatic disease throughout the
lumbar spine and bony pelvis, especially confluent in the right
iliac bone and in the sacral ala. There is continued tumor in the
anterior presacral space in danger of involving the sacral plexus,
although the amount of presacral tumor appears slightly less than
previous. No significant progressive collapse compared to prior.
Lumbar spondylosis and degenerative disc disease cause foraminal
impingement at L4-5 and L5-S1.
IMPRESSION: 1. The dominant left lower lobe lung nodule and the hepatic tumor is
improved, and the left anterior presacral tumor extension from the
sacrum also appears slightly less, suggesting generalized
improvement. Also, the pancreatic tail lesion is no longer readily
visible, and the pancreas appears somewhat more atrophic than on the
prior exam.
2. The bony lesions are mostly stable, although there several new
pathologic rib fractures as detailed above. The extent of osseous
metastatic burden is high. New flat nodularity in both lower lobes
could well be inflammatory rather than representing new neoplastic
pulmonary nodules, but merits attention on followup studies.
3. Stable equivocal anterior E urinary bladder wall thickening,
similar to prior.
4. Other findings include gist esophageal wall thickening (probably
from esophagitis), atherosclerosis ; cholelithiasis ; equivocal
distal rectal wall thickening ; and lower lumbar spondylosis and
degenerative disc disease causing foraminal impingement at L4-5 and
L5-S1.
